# Patient Record
Sex: Female | Born: 1965
Health system: Southern US, Community
[De-identification: ages and names within clinical notes are randomized; demographics above are authoritative.]

## PROBLEM LIST (undated history)

## (undated) DIAGNOSIS — F419 Anxiety disorder, unspecified: Secondary | ICD-10-CM

## (undated) DIAGNOSIS — K219 Gastro-esophageal reflux disease without esophagitis: Secondary | ICD-10-CM

## (undated) DIAGNOSIS — M545 Low back pain, unspecified: Secondary | ICD-10-CM

## (undated) DIAGNOSIS — Z8041 Family history of malignant neoplasm of ovary: Secondary | ICD-10-CM

## (undated) DIAGNOSIS — G2581 Restless legs syndrome: Secondary | ICD-10-CM

## (undated) DIAGNOSIS — T7840XA Allergy, unspecified, initial encounter: Secondary | ICD-10-CM

## (undated) DIAGNOSIS — M1990 Unspecified osteoarthritis, unspecified site: Secondary | ICD-10-CM

## (undated) DIAGNOSIS — M5431 Sciatica, right side: Secondary | ICD-10-CM

## (undated) DIAGNOSIS — G43909 Migraine, unspecified, not intractable, without status migrainosus: Secondary | ICD-10-CM

## (undated) DIAGNOSIS — R42 Dizziness and giddiness: Secondary | ICD-10-CM

## (undated) DIAGNOSIS — G4700 Insomnia, unspecified: Secondary | ICD-10-CM

## (undated) DIAGNOSIS — N951 Menopausal and female climacteric states: Secondary | ICD-10-CM

## (undated) DIAGNOSIS — Z87442 Personal history of urinary calculi: Secondary | ICD-10-CM

## (undated) DIAGNOSIS — N390 Urinary tract infection, site not specified: Secondary | ICD-10-CM

## (undated) DIAGNOSIS — F32A Depression, unspecified: Secondary | ICD-10-CM

## (undated) DIAGNOSIS — F329 Major depressive disorder, single episode, unspecified: Secondary | ICD-10-CM

## (undated) DIAGNOSIS — E559 Vitamin D deficiency, unspecified: Secondary | ICD-10-CM

## (undated) HISTORY — DX: Low back pain, unspecified: M54.50

## (undated) HISTORY — PX: ABDOMINAL HYSTERECTOMY: SHX81

## (undated) HISTORY — DX: Unspecified osteoarthritis, unspecified site: M19.90

## (undated) HISTORY — DX: Migraine, unspecified, not intractable, without status migrainosus: G43.909

## (undated) HISTORY — PX: DILATION AND CURETTAGE OF UTERUS: SHX78

## (undated) HISTORY — DX: Low back pain: M54.5

## (undated) HISTORY — DX: Restless legs syndrome: G25.81

## (undated) HISTORY — PX: TONSILLECTOMY AND ADENOIDECTOMY: SUR1326

## (undated) HISTORY — DX: Insomnia, unspecified: G47.00

## (undated) HISTORY — DX: Family history of malignant neoplasm of ovary: Z80.41

## (undated) HISTORY — PX: WISDOM TOOTH EXTRACTION: SHX21

## (undated) HISTORY — DX: Allergy, unspecified, initial encounter: T78.40XA

## (undated) HISTORY — PX: BACK SURGERY: SHX140

## (undated) HISTORY — DX: Sciatica, right side: M54.31

## (undated) HISTORY — DX: Gastro-esophageal reflux disease without esophagitis: K21.9

## (undated) HISTORY — DX: Vitamin D deficiency, unspecified: E55.9

## (undated) HISTORY — DX: Depression, unspecified: F32.A

## (undated) HISTORY — DX: Menopausal and female climacteric states: N95.1

## (undated) HISTORY — DX: Urinary tract infection, site not specified: N39.0

## (undated) HISTORY — PX: DIAGNOSTIC LAPAROSCOPY: SUR761

## (undated) HISTORY — DX: Major depressive disorder, single episode, unspecified: F32.9

## (undated) HISTORY — PX: URETHRAL STRICTURE DILATATION: SHX477

## (undated) HISTORY — DX: Anxiety disorder, unspecified: F41.9

---

## 2007-11-23 ENCOUNTER — Ambulatory Visit: Payer: Self-pay | Admitting: Family Medicine

## 2008-06-15 ENCOUNTER — Ambulatory Visit: Payer: Self-pay | Admitting: Family Medicine

## 2008-07-16 ENCOUNTER — Ambulatory Visit: Payer: Self-pay | Admitting: Family Medicine

## 2008-11-26 ENCOUNTER — Ambulatory Visit: Payer: Self-pay | Admitting: Family Medicine

## 2009-11-27 ENCOUNTER — Ambulatory Visit: Payer: Self-pay | Admitting: Family Medicine

## 2011-04-07 ENCOUNTER — Ambulatory Visit: Payer: Self-pay | Admitting: Physician Assistant

## 2011-04-21 ENCOUNTER — Ambulatory Visit: Payer: Self-pay | Admitting: Orthopedic Surgery

## 2011-05-07 HISTORY — PX: KNEE SURGERY: SHX244

## 2013-07-14 ENCOUNTER — Ambulatory Visit: Payer: Self-pay | Admitting: Family Medicine

## 2013-07-14 LAB — LIPID PANEL
CHOLESTEROL: 145 mg/dL (ref 0–200)
HDL: 48 mg/dL (ref 35–70)
LDL CALC: 78 mg/dL
Triglycerides: 95 mg/dL (ref 40–160)

## 2013-07-14 LAB — HM MAMMOGRAPHY: HM Mammogram: NORMAL

## 2013-07-27 LAB — HM PAP SMEAR: HM Pap smear: NORMAL

## 2013-07-27 LAB — HEMOGLOBIN A1C: Hgb A1c MFr Bld: 5.2 % (ref 4.0–6.0)

## 2014-12-27 ENCOUNTER — Encounter: Payer: Self-pay | Admitting: Family Medicine

## 2014-12-27 DIAGNOSIS — E8881 Metabolic syndrome: Secondary | ICD-10-CM | POA: Insufficient documentation

## 2014-12-27 DIAGNOSIS — G2581 Restless legs syndrome: Secondary | ICD-10-CM | POA: Insufficient documentation

## 2014-12-27 DIAGNOSIS — G4700 Insomnia, unspecified: Secondary | ICD-10-CM | POA: Insufficient documentation

## 2014-12-27 DIAGNOSIS — M5126 Other intervertebral disc displacement, lumbar region: Secondary | ICD-10-CM | POA: Insufficient documentation

## 2014-12-27 DIAGNOSIS — E669 Obesity, unspecified: Secondary | ICD-10-CM | POA: Insufficient documentation

## 2014-12-27 DIAGNOSIS — M5136 Other intervertebral disc degeneration, lumbar region: Secondary | ICD-10-CM | POA: Insufficient documentation

## 2014-12-27 DIAGNOSIS — K219 Gastro-esophageal reflux disease without esophagitis: Secondary | ICD-10-CM | POA: Insufficient documentation

## 2014-12-27 DIAGNOSIS — J302 Other seasonal allergic rhinitis: Secondary | ICD-10-CM | POA: Insufficient documentation

## 2014-12-27 DIAGNOSIS — M1990 Unspecified osteoarthritis, unspecified site: Secondary | ICD-10-CM | POA: Insufficient documentation

## 2014-12-27 DIAGNOSIS — G43009 Migraine without aura, not intractable, without status migrainosus: Secondary | ICD-10-CM | POA: Insufficient documentation

## 2014-12-27 DIAGNOSIS — F331 Major depressive disorder, recurrent, moderate: Secondary | ICD-10-CM | POA: Insufficient documentation

## 2014-12-28 ENCOUNTER — Encounter: Payer: Self-pay | Admitting: Family Medicine

## 2014-12-28 ENCOUNTER — Encounter (INDEPENDENT_AMBULATORY_CARE_PROVIDER_SITE_OTHER): Payer: Self-pay

## 2014-12-28 ENCOUNTER — Ambulatory Visit (INDEPENDENT_AMBULATORY_CARE_PROVIDER_SITE_OTHER): Payer: 59 | Admitting: Family Medicine

## 2014-12-28 VITALS — BP 114/62 | HR 88 | Temp 98.3°F | Resp 16 | Ht 63.0 in | Wt 212.3 lb

## 2014-12-28 DIAGNOSIS — M5417 Radiculopathy, lumbosacral region: Secondary | ICD-10-CM | POA: Diagnosis not present

## 2014-12-28 DIAGNOSIS — G43009 Migraine without aura, not intractable, without status migrainosus: Secondary | ICD-10-CM

## 2014-12-28 DIAGNOSIS — G4700 Insomnia, unspecified: Secondary | ICD-10-CM | POA: Diagnosis not present

## 2014-12-28 DIAGNOSIS — J302 Other seasonal allergic rhinitis: Secondary | ICD-10-CM

## 2014-12-28 DIAGNOSIS — K219 Gastro-esophageal reflux disease without esophagitis: Secondary | ICD-10-CM

## 2014-12-28 DIAGNOSIS — M5416 Radiculopathy, lumbar region: Secondary | ICD-10-CM | POA: Insufficient documentation

## 2014-12-28 DIAGNOSIS — G2581 Restless legs syndrome: Secondary | ICD-10-CM

## 2014-12-28 MED ORDER — CYCLOBENZAPRINE HCL 10 MG PO TABS: 10.0000 mg | ORAL_TABLET | Freq: Three times a day (TID) | ORAL | Status: DC | PRN

## 2014-12-28 MED ORDER — PRAMIPEXOLE DIHYDROCHLORIDE 1.5 MG PO TABS: 1.5000 mg | ORAL_TABLET | Freq: Every day | ORAL | Status: DC

## 2014-12-28 MED ORDER — TRAMADOL HCL 50 MG PO TABS: 50.0000 mg | ORAL_TABLET | Freq: Two times a day (BID) | ORAL | Status: DC | PRN

## 2014-12-28 MED ORDER — LORATADINE 10 MG PO TABS: 10.0000 mg | ORAL_TABLET | Freq: Two times a day (BID) | ORAL | Status: DC

## 2014-12-28 MED ORDER — PREDNISONE 10 MG (48) PO TBPK: ORAL_TABLET | Freq: Every day | ORAL | Status: DC

## 2014-12-28 NOTE — Patient Instructions (Signed)
Back Pain, Adult Low back pain is very common. About 1 in 5 people have back pain.The cause of low back pain is rarely dangerous. The pain often gets better over time.About half of people with a sudden onset of back pain feel better in just 2 weeks. About 8 in 10 people feel better by 6 weeks.  CAUSES Some common causes of back pain include:  Strain of the muscles or ligaments supporting the spine.  Wear and tear (degeneration) of the spinal discs.  Arthritis.  Direct injury to the back. DIAGNOSIS Most of the time, the direct cause of low back pain is not known.However, back pain can be treated effectively even when the exact cause of the pain is unknown.Answering your caregiver's questions about your overall health and symptoms is one of the most accurate ways to make sure the cause of your pain is not dangerous. If your caregiver needs more information, he or she may order lab work or imaging tests (X-rays or MRIs).However, even if imaging tests show changes in your back, this usually does not require surgery. HOME CARE INSTRUCTIONS For many people, back pain returns.Since low back pain is rarely dangerous, it is often a condition that people can learn to manageon their own.   Remain active. It is stressful on the back to sit or stand in one place. Do not sit, drive, or stand in one place for more than 30 minutes at a time. Take short walks on level surfaces as soon as pain allows.Try to increase the length of time you walk each day.  Do not stay in bed.Resting more than 1 or 2 days can delay your recovery.  Do not avoid exercise or work.Your body is made to move.It is not dangerous to be active, even though your back may hurt.Your back will likely heal faster if you return to being active before your pain is gone.  Pay attention to your body when you bend and lift. Many people have less discomfortwhen lifting if they bend their knees, keep the load close to their bodies,and  avoid twisting. Often, the most comfortable positions are those that put less stress on your recovering back.  Find a comfortable position to sleep. Use a firm mattress and lie on your side with your knees slightly bent. If you lie on your back, put a pillow under your knees.  Only take over-the-counter or prescription medicines as directed by your caregiver. Over-the-counter medicines to reduce pain and inflammation are often the most helpful.Your caregiver may prescribe muscle relaxant drugs.These medicines help dull your pain so you can more quickly return to your normal activities and healthy exercise.  Put ice on the injured area.  Put ice in a plastic bag.  Place a towel between your skin and the bag.  Leave the ice on for 15-20 minutes, 03-04 times a day for the first 2 to 3 days. After that, ice and heat may be alternated to reduce pain and spasms.  Ask your caregiver about trying back exercises and gentle massage. This may be of some benefit.  Avoid feeling anxious or stressed.Stress increases muscle tension and can worsen back pain.It is important to recognize when you are anxious or stressed and learn ways to manage it.Exercise is a great option. SEEK MEDICAL CARE IF:  You have pain that is not relieved with rest or medicine.  You have pain that does not improve in 1 week.  You have new symptoms.  You are generally not feeling well. SEEK   IMMEDIATE MEDICAL CARE IF:   You have pain that radiates from your back into your legs.  You develop new bowel or bladder control problems.  You have unusual weakness or numbness in your arms or legs.  You develop nausea or vomiting.  You develop abdominal pain.  You feel faint. Document Released: 06/22/2005 Document Revised: 12/22/2011 Document Reviewed: 10/24/2013 Exit[employer] Patient Information 2015 [employer]. This information is not intended to replace advice given to you by your health care provider. Make sure you  discuss any questions you have with your health care provider.  

## 2014-12-28 NOTE — Progress Notes (Signed)
Name: Kelly [name]   MRN: [id number]    DOB: Nov 10, 1965   Date:12/28/2014       Progress Note  Subjective  Chief Complaint  Chief Complaint  Patient presents with  . Headache    one currently  . Insomnia    pt states back pain keeps her awake  . Allergic Rhinitis     worsening itchy watery eyes, runny nose  . Back Pain    worsening since sunday low back pain radiating down right leg and weakness  . Gastrophageal Reflux    patient seen commercials about symptoms of UTI's and kidney stones from drugs    HPI  Acute low back pain: she has a history of herniated disc disease, and recurrently radiculitis down right leg.  However last flare was one year ago. She states pain at this time is very high. She has been out of Tramadol and Flexeril. She denies bowel or bladder incontinence, no saddle anesthesia. Pain is described as a dull ache on her back, but burning/sharp down her right leg. Pain is constant with periods of exacerbation.   GERD: controlled with Omeprazole, denies side effects  AR: worse because spending more time outdoors, rhinorrhea, itchy eyes and ears and post-nasal drainage.  Insomnia: doing better, but still getting up at 4am. Sleeping 6 hours per night, trying to have better sleep hygiene  Migraine headaches: intermittently, worse around her cycles and barometry pressure changes, allergies seems to flare it also. Takes Imitrex prn and responds to medication Patient Active Problem List   Diagnosis Date Noted  . Right lumbar radiculitis 12/28/2014  . Anxiety and depression 12/27/2014  . Insomnia, persistent 12/27/2014  . Gastro-esophageal reflux disease without esophagitis 12/27/2014  . Bulge of lumbar disc without myelopathy 12/27/2014  . Dysmetabolic syndrome 57/89/7847  . Migraine without aura and responsive to treatment 12/27/2014  . Osteoarthrosis 12/27/2014  . Obesity (BMI 30-39.9) 12/27/2014  . Restless leg 12/27/2014  . Allergic rhinitis, seasonal  12/27/2014    Past Surgical History  Procedure Laterality Date  . Tonsillectomy and adenoidectomy    . Urethral stricture dilatation      History reviewed. No pertinent family history.  History   Social History  . Marital Status: Married    Spouse Name: N/A  . Number of Children: N/A  . Years of Education: N/A   Occupational History  . Not on file.   Social History Main Topics  . Smoking status: Former Smoker -- 6.00 packs/day for 2 years    Types: Cigarettes    Quit date: 07/07/1983  . Smokeless tobacco: Not on file  . Alcohol Use: No  . Drug Use: No  . Sexual Activity: Yes   Other Topics Concern  . Not on file   Social History Narrative  . No narrative on file     Current outpatient prescriptions:  .  ALPRAZolam (XANAX) 0.5 MG tablet, Take 1 tablet by mouth 2 (two) times daily as needed., Disp: , Rfl:  .  cyclobenzaprine (FLEXERIL) 10 MG tablet, Take 1 tablet (10 mg total) by mouth 3 (three) times daily as needed for muscle spasms., Disp: 30 tablet, Rfl: 0 .  gabapentin (NEURONTIN) 300 MG capsule, Take 1 capsule by mouth 3 (three) times daily., Disp: , Rfl:  .  loratadine (CLARITIN) 10 MG tablet, Take 1 tablet (10 mg total) by mouth 2 (two) times daily., Disp: 60 tablet, Rfl: 5 .  omeprazole (PRILOSEC) 40 MG capsule, Take 1 capsule by mouth daily., Disp: ,  Rfl:  .  pramipexole (MIRAPEX) 1.5 MG tablet, Take 1 tablet (1.5 mg total) by mouth daily., Disp: 30 tablet, Rfl: 5 .  SUMAtriptan (IMITREX) 50 MG tablet, Take 1 tablet by mouth as needed., Disp: , Rfl:  .  traMADol (ULTRAM) 50 MG tablet, Take 1 tablet (50 mg total) by mouth 2 (two) times daily as needed., Disp: 90 tablet, Rfl: 0 .  traZODone (DESYREL) 50 MG tablet, Take 1 tablet by mouth as needed., Disp: , Rfl:  .  predniSONE (STERAPRED UNI-PAK 48 TAB) 10 MG (48) TBPK tablet, Take by mouth daily., Disp: 48 tablet, Rfl: 0  Allergies  Allergen Reactions  . Cephalexin   . Ciprofloxacin Hives  . Nitrofurantoin  Monohyd Macro   . Penicillins   . Sulfa Antibiotics      ROS  Constitutional: Negative for fever or weight change.  Respiratory: Negative for cough and shortness of breath.   Cardiovascular: Negative for chest pain or palpitations.  Gastrointestinal: Negative for abdominal pain, no bowel changes.  Musculoskeletal: Negative for gait problem or joint swelling.  Skin: Negative for rash.  Neurological: Negative for dizziness + for  headache.  No other specific complaints in a complete review of systems (except as listed in HPI above).   Objective  Filed Vitals:   12/28/14 1010  BP: 114/62  Pulse: 88  Temp: 98.3 F (36.8 C)  TempSrc: Oral  Resp: 16  Height: 5\' 3"  (1.6 m)  Weight: 212 lb 4.8 oz (96.299 kg)  SpO2: 97%    Body mass index is 37.62 kg/(m^2).  Physical Exam  Constitutional: Patient appears well-developed and well-nourished. No distress.  Eyes:  No scleral icterus. PERL Neck: Normal range of motion. Neck supple. Cardiovascular: Normal rate, regular rhythm and normal heart sounds.  No murmur heard. No BLE edema. Pulmonary/Chest: Effort normal and breath sounds normal. No respiratory distress. Abdominal: Soft.  There is no tenderness. Psychiatric: Patient has a normal mood and affect. behavior is normal. Judgment and thought content normal. Muscular skeletal exam: pain during palpation of lumbar spine, positive straight leg raise, normal gait, normal sensation to touch of both lower extremities    PHQ2/9: Depression screen [facility] 2/9 12/28/2014  Decreased Interest 0  Down, Depressed, Hopeless 0  PHQ - 2 Score 0     Fall Risk: Fall Risk  12/28/2014  Falls in the past year? Yes  Number falls in past yr: 1  Injury with Fall? Yes     Assessment & Plan  1. Right lumbar radiculitis Start medication, return if no resolution - predniSONE (STERAPRED UNI-PAK 48 TAB) 10 MG (48) TBPK tablet; Take by mouth daily.  Dispense: 48 tablet; Refill: 0 - cyclobenzaprine  (FLEXERIL) 10 MG tablet; Take 1 tablet (10 mg total) by mouth 3 (three) times daily as needed for muscle spasms.  Dispense: 30 tablet; Refill: 0 - traMADol (ULTRAM) 50 MG tablet; Take 1 tablet (50 mg total) by mouth 2 (two) times daily as needed.  Dispense: 90 tablet; Refill: 0  2. Restless leg  - pramipexole (MIRAPEX) 1.5 MG tablet; Take 1 tablet (1.5 mg total) by mouth daily.  Dispense: 30 tablet; Refill: 5  3. Migraine without aura and responsive to treatment Continue prn medication  4. Gastro-esophageal reflux disease without esophagitis stable  5. Insomnia, persistent Doing well at this time  6. Allergic rhinitis, seasonal  - loratadine (CLARITIN) 10 MG tablet; Take 1 tablet (10 mg total) by mouth 2 (two) times daily.  Dispense: 60 tablet; Refill: 5

## 2015-01-11 ENCOUNTER — Other Ambulatory Visit: Payer: Self-pay | Admitting: Family Medicine

## 2015-01-15 ENCOUNTER — Ambulatory Visit (INDEPENDENT_AMBULATORY_CARE_PROVIDER_SITE_OTHER): Payer: 59 | Admitting: Family Medicine

## 2015-01-15 ENCOUNTER — Encounter: Payer: Self-pay | Admitting: Family Medicine

## 2015-01-15 VITALS — BP 116/62 | HR 95 | Temp 97.9°F | Resp 20 | Ht 63.0 in | Wt 214.3 lb

## 2015-01-15 DIAGNOSIS — G43019 Migraine without aura, intractable, without status migrainosus: Secondary | ICD-10-CM | POA: Diagnosis not present

## 2015-01-15 DIAGNOSIS — J0100 Acute maxillary sinusitis, unspecified: Secondary | ICD-10-CM

## 2015-01-15 DIAGNOSIS — J069 Acute upper respiratory infection, unspecified: Secondary | ICD-10-CM

## 2015-01-15 MED ORDER — PREDNISONE 20 MG PO TABS: 20.0000 mg | ORAL_TABLET | Freq: Every day | ORAL | Status: DC

## 2015-01-15 MED ORDER — PROMETHAZINE HCL 12.5 MG PO TABS: 12.5000 mg | ORAL_TABLET | Freq: Three times a day (TID) | ORAL | Status: DC | PRN

## 2015-01-15 MED ORDER — CEFUROXIME AXETIL 500 MG PO TABS: 500.0000 mg | ORAL_TABLET | Freq: Two times a day (BID) | ORAL | Status: DC

## 2015-01-15 NOTE — Progress Notes (Signed)
Name: Kelly [name]   MRN: [id number]    DOB: [birth year]   Date:01/15/2015       Progress Note  Subjective  Chief Complaint  Chief Complaint  Patient presents with  . URI    Onset- several days, follow up with Urgent Care and was given Z-pak which patient can't tolerate. Worsen-Headaches, Moist Cough-little mucus, sinus pain pressure,ear pressure and pain in bilateral ears, nausea, vomiting.  . Fever    100 F, chills and has improved with Tylenol    HPI  URI: she states she has been sick for one week, but getting progressively worse, went to Urgent Care yesterday, diagnosed with sinusitis and given Z-pack but she can't tolerate it, she would like to be switched to Ceftin - she is aware that she had a reaction to Keflex and PnC, but she states not allergic to Ceftin ( it is the only antibiotic that I can take).  Facial pain is severe now, hoarseness and a moist cough.   Migraine: she describes the headache as throbbing, associated with photophobia, nausea, initially had vomiting, but that has resolved.   Patient Active Problem List   Diagnosis Date Noted  . Right lumbar radiculitis 12/28/2014  . Anxiety and depression 12/27/2014  . Insomnia, persistent 12/27/2014  . Gastro-esophageal reflux disease without esophagitis 12/27/2014  . Bulge of lumbar disc without myelopathy 12/27/2014  . Dysmetabolic syndrome 40/98/1191  . Migraine without aura and responsive to treatment 12/27/2014  . Osteoarthrosis 12/27/2014  . Obesity (BMI 30-39.9) 12/27/2014  . Restless leg 12/27/2014  . Allergic rhinitis, seasonal 12/27/2014    Past Surgical History  Procedure Laterality Date  . Tonsillectomy and adenoidectomy    . Urethral stricture dilatation    . Knee surgery Left 05/2011    Dr. Rudene Christians- Arthroscopic    History reviewed. No pertinent family history.  History   Social History  . Marital Status: Married    Spouse Name: N/A  . Number of Children: N/A  . Years of Education: N/A    Occupational History  . Not on file.   Social History Main Topics  . Smoking status: Former Smoker -- 0.75 packs/day for 2 years    Types: Cigarettes    Start date: 07/06/1981    Quit date: 07/07/1983  . Smokeless tobacco: Never Used  . Alcohol Use: No  . Drug Use: No  . Sexual Activity:    Partners: Male   Other Topics Concern  . Not on file   Social History Narrative     Current outpatient prescriptions:  .  ALPRAZolam (XANAX) 0.5 MG tablet, Take 1 tablet by mouth 2 (two) times daily as needed., Disp: , Rfl:  .  Butalbital-APAP-Caffeine 50-325-40 MG per capsule, Take 1 capsule by mouth every 4 (four) hours as needed., Disp: , Rfl:  .  cyclobenzaprine (FLEXERIL) 10 MG tablet, Take 1 tablet (10 mg total) by mouth 3 (three) times daily as needed for muscle spasms., Disp: 30 tablet, Rfl: 0 .  gabapentin (NEURONTIN) 300 MG capsule, Take 1 capsule by mouth 3 (three) times daily., Disp: , Rfl:  .  loratadine (CLARITIN) 10 MG tablet, TAKE ONE TABLET BY MOUTH ONE TIME DAILY, Disp: 30 tablet, Rfl: 0 .  omeprazole (PRILOSEC) 40 MG capsule, Take 1 capsule by mouth daily., Disp: , Rfl:  .  pramipexole (MIRAPEX) 1.5 MG tablet, TAKE ONE TABLET BY MOUTH DAILY IN THE EVENING FOR RLS, Disp: 30 tablet, Rfl: 2 .  SUMAtriptan (IMITREX) 50 MG tablet, Take  1 tablet by mouth as needed., Disp: , Rfl:  .  traMADol (ULTRAM) 50 MG tablet, Take 1 tablet (50 mg total) by mouth 2 (two) times daily as needed., Disp: 90 tablet, Rfl: 0 .  traZODone (DESYREL) 50 MG tablet, Take 1 tablet by mouth as needed., Disp: , Rfl:  .  triamcinolone (NASACORT) 55 MCG/ACT AERO nasal inhaler, Place 2 sprays into the nose daily., Disp: , Rfl:  .  cefUROXime (CEFTIN) 500 MG tablet, Take 1 tablet (500 mg total) by mouth 2 (two) times daily with a meal., Disp: 20 tablet, Rfl: 0 .  predniSONE (DELTASONE) 20 MG tablet, Take 1 tablet (20 mg total) by mouth daily with breakfast., Disp: 4 tablet, Rfl: 0 .  promethazine (PHENERGAN)  12.5 MG tablet, Take 1 tablet (12.5 mg total) by mouth every 8 (eight) hours as needed for nausea or vomiting., Disp: 12 tablet, Rfl: 0  Allergies  Allergen Reactions  . Cephalexin   . Ciprofloxacin Hives  . Nitrofurantoin Monohyd Macro   . Penicillins   . Sulfa Antibiotics      ROS  Ten systems reviewed and is negative except as mentioned in HPI   Objective  Filed Vitals:   01/15/15 0941  BP: 116/62  Pulse: 95  Temp: 97.9 F (36.6 C)  TempSrc: Oral  Resp: 20  Height: 5\' 3"  (1.6 m)  Weight: 214 lb 4.8 oz (97.206 kg)  SpO2: 94%    Body mass index is 37.97 kg/(m^2).  Physical Exam   Constitutional: Patient appears well-developed and obese. Mild distress, wearing her shades and crying Eyes:  No scleral icterus. PERL Sinus tender to percussion  Neck: Normal range of motion. Neck supple. Ears: within normal limits Nares: mild bulging of turbinates Cardiovascular: Normal rate, regular rhythm and normal heart sounds.  No murmur heard. No BLE edema. Pulmonary/Chest: Effort normal and breath sounds normal. No respiratory distress. Abdominal: Soft.  There is no tenderness. Psychiatric: Patient has a normal mood and affect. behavior is normal. Judgment and thought content normal. Neuro: awake, alert, oriented, normal cranial nerves, normal reflexes, normal sensation  PHQ2/9: Depression screen [facility] 2/9 12/28/2014  Decreased Interest 0  Down, Depressed, Hopeless 0  PHQ - 2 Score 0     Fall Risk: Fall Risk  12/28/2014  Falls in the past year? Yes  Number falls in past yr: 1  Injury with Fall? Yes  Follow up Falls evaluation completed      Assessment & Plan  1. Upper respiratory infection Advised otc medication, nasal saline, hold off on antibiotics, try prednisone first  2. Intractable migraine without aura and without status migrainosus Taking some Butalbital otc, also failed Imitrex - predniSONE (DELTASONE) 20 MG tablet; Take 1 tablet (20 mg total) by mouth  daily with breakfast.  Dispense: 4 tablet; Refill: 0 - promethazine (PHENERGAN) 12.5 MG tablet; Take 1 tablet (12.5 mg total) by mouth every 8 (eight) hours as needed for nausea or vomiting.  Dispense: 12 tablet; Refill: 0  3. Acute maxillary sinusitis, recurrence not specified Likely viral, told to try prednisone and if no improvement get prescription of antibiotic filled - cefUROXime (CEFTIN) 500 MG tablet; Take 1 tablet (500 mg total) by mouth 2 (two) times daily with a meal.  Dispense: 20 tablet; Refill: 0

## 2015-02-03 ENCOUNTER — Other Ambulatory Visit: Payer: Self-pay | Admitting: Family Medicine

## 2015-02-10 ENCOUNTER — Other Ambulatory Visit: Payer: Self-pay | Admitting: Family Medicine

## 2015-04-05 ENCOUNTER — Encounter: Payer: Self-pay | Admitting: Family Medicine

## 2015-04-05 ENCOUNTER — Ambulatory Visit (INDEPENDENT_AMBULATORY_CARE_PROVIDER_SITE_OTHER): Payer: 59 | Admitting: Family Medicine

## 2015-04-05 ENCOUNTER — Other Ambulatory Visit: Payer: Self-pay | Admitting: Family Medicine

## 2015-04-05 VITALS — BP 118/72 | HR 82 | Temp 97.8°F | Resp 16 | Ht 63.0 in | Wt 217.0 lb

## 2015-04-05 DIAGNOSIS — R319 Hematuria, unspecified: Secondary | ICD-10-CM | POA: Diagnosis not present

## 2015-04-05 DIAGNOSIS — M5416 Radiculopathy, lumbar region: Secondary | ICD-10-CM

## 2015-04-05 DIAGNOSIS — E669 Obesity, unspecified: Secondary | ICD-10-CM | POA: Diagnosis not present

## 2015-04-05 DIAGNOSIS — R202 Paresthesia of skin: Secondary | ICD-10-CM

## 2015-04-05 DIAGNOSIS — E8881 Metabolic syndrome: Secondary | ICD-10-CM

## 2015-04-05 DIAGNOSIS — Z23 Encounter for immunization: Secondary | ICD-10-CM

## 2015-04-05 DIAGNOSIS — R600 Localized edema: Secondary | ICD-10-CM | POA: Diagnosis not present

## 2015-04-05 DIAGNOSIS — M5417 Radiculopathy, lumbosacral region: Secondary | ICD-10-CM | POA: Diagnosis not present

## 2015-04-05 LAB — POCT URINALYSIS DIPSTICK
Bilirubin, UA: NEGATIVE
GLUCOSE UA: NEGATIVE
Ketones, UA: NEGATIVE
NITRITE UA: NEGATIVE
PROTEIN UA: NEGATIVE
Spec Grav, UA: 1.015
UROBILINOGEN UA: 0.2
pH, UA: 5.5

## 2015-04-05 MED ORDER — MEDICAL COMPRESSION STOCKINGS MISC: 2.0000 [IU] | Freq: Every day | Status: DC

## 2015-04-05 MED ORDER — MELOXICAM 15 MG PO TABS: 15.0000 mg | ORAL_TABLET | Freq: Every day | ORAL | Status: DC

## 2015-04-05 NOTE — Progress Notes (Signed)
Name: Kelly [name]   MRN: [id number]    DOB: 10-Nov-1965   Date:04/05/2015       Progress Note  Subjective  Chief Complaint  Chief Complaint  Patient presents with  . Edema    bilateral feet, has been working and standing on them all day  . Sciatica    right side low back radiating down leg with numbness    HPI  Bilateral Low Extremity edema: she started working at [employer] and has noticed significant edema on lower extremities. She denies orthopnea or decrease in exercise tolerance. No chest pain. Normal urine output.   Sciatica: doing better now, finished prednisone, still on gabapentin.  Paresthesia hands: usually on right hand from 5th to index finger, numbness goes up to lateral elbow. No neck symptoms. No weakness, she has been crocheting more than usual, and uses her hands more at work   Obesity: she has bene eating at work, drinking more orange juice, gaining weight. She had decreased salt intake and is avoiding sodas. Her mother is obese, status post bariatric surgery  Metabolic Syndrome: still eats high carbohydrate diet  Patient Active Problem List   Diagnosis Date Noted  . Right lumbar radiculitis 12/28/2014  . Anxiety and depression 12/27/2014  . Insomnia, persistent 12/27/2014  . Gastro-esophageal reflux disease without esophagitis 12/27/2014  . Bulge of lumbar disc without myelopathy 12/27/2014  . Dysmetabolic syndrome 74/25/9563  . Migraine without aura and responsive to treatment 12/27/2014  . Osteoarthrosis 12/27/2014  . Obesity (BMI 30-39.9) 12/27/2014  . Restless leg 12/27/2014  . Allergic rhinitis, seasonal 12/27/2014    Past Surgical History  Procedure Laterality Date  . Tonsillectomy and adenoidectomy    . Urethral stricture dilatation    . Knee surgery Left 05/2011    Dr. Rudene Christians- Arthroscopic    History reviewed. No pertinent family history.  Social History   Social History  . Marital Status: Married    Spouse  Name: N/A  . Number of Children: N/A  . Years of Education: N/A   Occupational History  . Not on file.   Social History Main Topics  . Smoking status: Former Smoker -- 0.75 packs/day for 2 years    Types: Cigarettes    Start date: 07/06/1981    Quit date: 07/07/1983  . Smokeless tobacco: Never Used  . Alcohol Use: No  . Drug Use: No  . Sexual Activity:    Partners: Male   Other Topics Concern  . Not on file   Social History Narrative     Current outpatient prescriptions:  .  ALPRAZolam (XANAX) 0.5 MG tablet, Take 1 tablet by mouth 2 (two) times daily as needed., Disp: , Rfl:  .  cyclobenzaprine (FLEXERIL) 10 MG tablet, Take 1 tablet (10 mg total) by mouth 3 (three) times daily as needed for muscle spasms., Disp: 30 tablet, Rfl: 0 .  Elastic Bandages & Supports (MEDICAL COMPRESSION STOCKINGS) MISC, 2 Units by Does not apply route daily., Disp: 1 each, Rfl: 2 .  gabapentin (NEURONTIN) 300 MG capsule, TAKE ONE CAPSULE BY MOUTH THREE TIMES DAILY, Disp: 90 capsule, Rfl: 1 .  loratadine (CLARITIN) 10 MG tablet, TAKE ONE TABLET BY MOUTH ONE TIME DAILY, Disp: 30 tablet, Rfl: 0 .  omeprazole (PRILOSEC) 40 MG capsule, TAKE ONE CAPSULE BY MOUTH EVERY MORNING, Disp: 30 capsule, Rfl: 5 .  pramipexole (MIRAPEX) 1.5 MG tablet, TAKE ONE TABLET BY MOUTH DAILY IN THE EVENING FOR RLS, Disp: 30 tablet, Rfl: 2 .  promethazine (PHENERGAN) 12.5 MG tablet, Take 1 tablet (12.5 mg total) by mouth every 8 (eight) hours as needed for nausea or vomiting., Disp: 12 tablet, Rfl: 0 .  SUMAtriptan (IMITREX) 50 MG tablet, Take 1 tablet by mouth as needed., Disp: , Rfl:  .  traMADol (ULTRAM) 50 MG tablet, Take 1 tablet (50 mg total) by mouth 2 (two) times daily as needed., Disp: 90 tablet, Rfl: 0 .  traZODone (DESYREL) 50 MG tablet, Take 1 tablet by mouth as needed., Disp: , Rfl:   Allergies  Allergen Reactions  . Cephalexin   . Ciprofloxacin Hives  . Nitrofurantoin Monohyd Macro   . Penicillins   . Sulfa  Antibiotics      ROS  Constitutional: Negative for fever , positive for  weight change.  Respiratory: Negative for cough and shortness of breath.   Cardiovascular: Negative for chest pain or palpitations.  Gastrointestinal: Negative for abdominal pain, no bowel changes.  Musculoskeletal: Negative for gait problem or joint swelling.  Skin: Negative for rash.  Neurological: Negative for dizziness or headache.  No other specific complaints in a complete review of systems (except as listed in HPI above).  Objective  Filed Vitals:   04/05/15 1122  BP: 118/72  Pulse: 82  Temp: 97.8 F (36.6 C)  TempSrc: Oral  Resp: 16  Height: 5\' 3"  (1.6 m)  Weight: 217 lb (98.431 kg)  SpO2: 97%    Body mass index is 38.45 kg/(m^2).  Physical Exam  Constitutional: Patient appears well-developed and well-nourished. Obese No distress.  HEENT: head atraumatic, normocephalic, pupils equal and reactive to light, neck supple, throat within normal limits Cardiovascular: Normal rate, regular rhythm and normal heart sounds.  No murmur heard.  BLE edema. Pitting on ankles, non pitting on lower leg Pulmonary/Chest: Effort normal and breath sounds normal. No respiratory distress. Abdominal: Soft.  There is no tenderness. Negative CVA tenderness Psychiatric: Patient has a normal mood and affect. behavior is normal. Judgment and thought content normal. Neurological Exam: negative tinnels' and phalen's, normal sensation both arms, pain during palpation of medial and lateral epicondyle, negative straight leg raise, mild back pain during palpation of lumbar spine   PHQ2/9: Depression screen [facility] 2/9 04/05/2015 12/28/2014  Decreased Interest 0 0  Down, Depressed, Hopeless 1 0  PHQ - 2 Score 1 0     Fall Risk: Fall Risk  04/05/2015 12/28/2014  Falls in the past year? Yes Yes  Number falls in past yr: 1 1  Injury with Fall? No Yes  Follow up - Falls evaluation completed      Functional Status  Survey: Is the patient deaf or have difficulty hearing?: No Does the patient have difficulty seeing, even when wearing glasses/contacts?: Yes (contacts) Does the patient have difficulty concentrating, remembering, or making decisions?: No Does the patient have difficulty walking or climbing stairs?: No Does the patient have difficulty dressing or bathing?: No Does the patient have difficulty doing errands alone such as visiting a doctor's office or shopping?: No   Assessment & Plan  1. Paresthesia of both hands  Possible ulnar entrapment, tendinitis, she does not want NCS at this time, no neck symptoms.  - Vitamin B12  2. Bilateral lower extremity edema  - Comprehensive metabolic panel - CBC with Differential/Platelet - TSH - Elastic Bandages & Supports (MEDICAL COMPRESSION STOCKINGS) MISC; 2 Units by Does not apply route daily.  Dispense: 1 each; Refill: 2 - POCT Urinalysis Dipstick  3. Needs flu shot  - Flu Vaccine QUAD  36+ mos PF IM (Fluarix & Fluzone Quad PF)  4. Dysmetabolic syndrome  - POCT glycosylated hemoglobin (Hb A1C)  5. Right lumbar radiculitis  - meloxicam (MOBIC) 15 MG tablet; Take 1 tablet (15 mg total) by mouth daily.  Dispense: 30 tablet; Refill: 0  6. Obesity (BMI 30-39.9)  7. Hematuria  - Urine Culture,  but if urine culture negative refer to Urologist

## 2015-04-05 NOTE — Patient Instructions (Signed)
My Fitness Pal    Phentermine; Topiramate extended-release capsules  What is this medicine? Qsymia Phentermine; topiramate (FEN ter meen; toe PYRE a mate) is a combination of two medicines used with a reduced calorie diet and exercise to help you lose weight. This medicine is only available through certified pharmacies enrolled in a special program. Your healthcare professional will tell you where you can get your medicine. If you have additional questions, you can visit the manufacture's website at [email or web address] or contact them by phone at [phone]. This medicine may be used for other purposes; ask your health care provider or pharmacist if you have questions. COMMON BRAND NAME(S): Qsymia What should I tell my health care provider before I take this medicine? They need to know if you have any of these conditions: -agitation -diarrhea -depression or other mental illness -diabetes -glaucoma -heart disease -high or low blood pressure -history of anorexia or other eating disorder -history of substance abuse -kidney stones or kidney disease -liver disease -lung disease like asthma, obstructive pulmonary disease, emphysema -metabolic acidosis -on a ketogenic diet -scheduled for surgery or a procedure -suicidal thoughts, plans, or attempt; a previous suicide attempt by you or a family member -taken an MAOI like Carbex, Eldepryl, Marplan, Nardil, or Parnate in last 14 days -thyroid disease -an unusual or allergic reaction to phentermine, topiramate, other medicines, foods, dyes, or preservatives -pregnant or trying to get pregnant -breast-feeding How should I use this medicine? Take this medicine by mouth with a glass of water. Follow the directions on the prescription label. Do not crush or chew. This medicine is usually taken with or without food once per day in the morning. Avoid taking this medicine in the evening. It may interfere with sleep. Take your doses at regular  intervals. Do not take your medicine more often than directed. A special MedGuide will be given to you by the pharmacist with each prescription and refill. Be sure to read this information carefully each time. Talk to your pediatrician regarding the use of this medicine in children. Special care may be needed. Overdosage: If you think you've taken too much of this medicine contact a poison control center or emergency room at once. Overdosage: If you think you have taken too much of this medicine contact a poison control center or emergency room at once. NOTE: This medicine is only for you. Do not share this medicine with others. What if I miss a dose? If you miss a dose, take it as soon as you can. If it is almost time for your next dose, take only that dose. Do not take double or extra doses. What may interact with this medicine? Do not take this medicine with any of the following medications: -MAOIs like Carbex, Eldepryl, Marplan, Nardil, and ParnateThis medicine may also interact with the following medications: -acetazolamide -amitriptyline -antihistamines for allergy, cough and cold -atropine -birth control pills -carbamazepine -certain medicines for bladder problems like oxybutynin, tolterodine -certain medicines for depression, anxiety, or psychotic disturbances -certain medicines for Parkinson's disease like benztropine, trihexyphenidyl -certain medicines for stomach problems like dicyclomine, hyoscyamine -certain medicines for travel sickness like scopolamine -dichlorphenamide -digoxin -diltiazem -diuretics -hydrochlorothiazide -ipratropium -lithium -medicines for diabetes -medicines for pain, sleep, or muscle relaxation -methazolamide -phenytoin -pioglitazone -stimulant medicines for attention disorders, weight loss, or to stay awake -valproic acid -zonisamide This list may not describe all possible interactions. Give your health care provider a list of all the  medicines, herbs, non-prescription drugs, or dietary supplements you use. Also tell them  if you smoke, drink alcohol, or use illegal drugs. Some items may interact with your medicine. What should I watch for while using this medicine? Visit your doctor or health care professional for regular checks on your progress. This medicine is intended to be used in addition to a healthy diet and appropriate exercise. The best results are achieved this way. Do not increase or in any way change your dose without consulting your doctor or health care professional. Do not take this medicine within 6 hours of bedtime. It can keep you from getting to sleep. Avoid drinks that contain caffeine and try to stick to a regular bedtime every night. Do not stop taking this medicine suddenly. This increases the risk of seizures. This medicine can decrease sweating and increase your body temperature. Watch for signs of deceased sweating or fever. Avoid extreme heat, hot baths, and saunas. Be careful about exercising, especially in hot weather. Contact your health care provider right away if you notice a fever or decrease in sweating. You should drink plenty of fluids while taking this medicine. If you have had kidney stones in the past, this will help to reduce your chances of forming kidney stones. If you have stomach pain, with nausea or vomiting and yellowing of your eyes or skin, call your doctor immediately. You may get drowsy or dizzy. Do not drive, use machinery, or do anything that needs mental alertness until you know how this medicine affects you. Do not stand or sit up quickly, especially if you are an older patient. This reduces the risk of dizzy or fainting spells. Alcohol may increase dizziness and drowsiness. Avoid alcoholic drinks. This medicine may affect blood sugar levels. If you have diabetes, check with your doctor or health care professional before you change your diet or the dose of your diabetic  medicine. Patients and their families should watch out for worsening depression or thoughts of suicide. Also watch out for sudden changes in feelings such as feeling anxious, agitated, panicky, irritable, hostile, aggressive, impulsive, severely restless, overly excited and hyperactive, or not being able to sleep. If this happens, especially at the beginning of treatment or after a change in dose, call your health care professional. If you notice blurred vision, eye pain, or other eye problems, seek medical attention at once for an eye exam. This medicine may increase the chance of developing metabolic acidosis. If left untreated, this can cause kidney stones, bone disease, or slowed growth in children. Symptoms include breathing fast, fatigue, loss of appetite, irregular heartbeat, or loss of consciousness. Call your doctor immediately if you experience any of these side effects. Also, tell your doctor about any surgery you plan on having while taking this medicine since this may increase your risk for metabolic acidosis. Women who become pregnant while using this medicine should contact their physician immediately. You should also contact The Qsymia Pregnancy Surveillance Program which is a program that monitors pregnancies that occur during treatment. Contact the program by calling [phone]. What side effects may I notice from receiving this medicine? Side effects that you should report to your doctor or health care professional as soon as possible: -allergic reactions like skin rash, itching or hives, swelling of the face, lips, or tongue -blood in the urine -changes in vision -chest pain or chest tightness -confusion -depressed mood -difficulty breathing -dizziness -fast or irregular heartbeat -feeling anxious -irritable -loss of appetite -low blood pressure -pain in the lower back or side -pain, tingling, numbness in the hands or feet -  pain when urinating -palpitations -redness,  blistering, peeling or loosening of the skin, including inside the mouth -shortness of breath -suicidal thoughts or other mood changes -trouble passing urine or change in the amount of urine -trouble walking, dizziness, loss of balance or coordination -unusually weak or tired -vomiting Side effects that usually do not require medical attention (Report these to your doctor or health care professional if they continue or are bothersome.): -change in sex drive or performance -changes in vision -constipation -diarrhea -dry mouth -headache -nausea -tremors -trouble sleeping -upset stomach This list may not describe all possible side effects. Call your doctor for medical advice about side effects. You may report side effects to FDA at 1-800-FDA-1088. Where should I keep my medicine? Keep out of the reach of children. This medicine can be abused. Keep your medicine in a safe place to protect it from theft. Do not share this medicine with anyone. Selling or giving away this medicine is dangerous and against the law. Store at room temperature between 15 and 25 degrees C (59 and 77 degrees F). Throw away any unused medicine after the expiration date. NOTE: This sheet is a summary. It may not cover all possible information. If you have questions about this medicine, talk to your doctor, pharmacist, or health care provider.  2015, [employer]. (2011-01-26 13:56:53)

## 2015-04-06 ENCOUNTER — Other Ambulatory Visit: Payer: Self-pay | Admitting: Family Medicine

## 2015-04-06 DIAGNOSIS — R739 Hyperglycemia, unspecified: Secondary | ICD-10-CM | POA: Insufficient documentation

## 2015-04-06 LAB — COMPREHENSIVE METABOLIC PANEL
ALT: 14 IU/L (ref 0–32)
AST: 17 IU/L (ref 0–40)
Albumin/Globulin Ratio: 1.9 (ref 1.1–2.5)
Albumin: 4.4 g/dL (ref 3.5–5.5)
Alkaline Phosphatase: 57 IU/L (ref 39–117)
BUN/Creatinine Ratio: 20 (ref 9–23)
BUN: 14 mg/dL (ref 6–24)
Bilirubin Total: 0.8 mg/dL (ref 0.0–1.2)
CALCIUM: 9.6 mg/dL (ref 8.7–10.2)
CHLORIDE: 104 mmol/L (ref 97–108)
CO2: 23 mmol/L (ref 18–29)
Creatinine, Ser: 0.69 mg/dL (ref 0.57–1.00)
GFR calc Af Amer: 117 mL/min/{1.73_m2} (ref >59)
GFR, EST NON AFRICAN AMERICAN: 102 mL/min/{1.73_m2} (ref >59)
GLUCOSE: 100 mg/dL — AB (ref 65–99)
Globulin, Total: 2.3 g/dL (ref 1.5–4.5)
Potassium: 5 mmol/L (ref 3.5–5.2)
Sodium: 146 mmol/L — ABNORMAL HIGH (ref 134–144)
Total Protein: 6.7 g/dL (ref 6.0–8.5)

## 2015-04-06 LAB — CBC WITH DIFFERENTIAL/PLATELET
BASOS ABS: 0 10*3/uL (ref 0.0–0.2)
BASOS: 1 %
EOS (ABSOLUTE): 0.3 10*3/uL (ref 0.0–0.4)
Eos: 3 %
HEMOGLOBIN: 13.2 g/dL (ref 11.1–15.9)
Hematocrit: 40.3 % (ref 34.0–46.6)
IMMATURE GRANS (ABS): 0 10*3/uL (ref 0.0–0.1)
Immature Granulocytes: 0 %
Lymphocytes Absolute: 2.9 10*3/uL (ref 0.7–3.1)
Lymphs: 34 %
MCH: 29.2 pg (ref 26.6–33.0)
MCHC: 32.8 g/dL (ref 31.5–35.7)
MCV: 89 fL (ref 79–97)
MONOCYTES: 6 %
Monocytes Absolute: 0.5 10*3/uL (ref 0.1–0.9)
NEUTROS ABS: 4.8 10*3/uL (ref 1.4–7.0)
NEUTROS PCT: 56 %
Platelets: 352 10*3/uL (ref 150–379)
RBC: 4.52 x10E6/uL (ref 3.77–5.28)
RDW: 13.4 % (ref 12.3–15.4)
WBC: 8.5 10*3/uL (ref 3.4–10.8)

## 2015-04-06 LAB — TSH: TSH: 1.38 u[IU]/mL (ref 0.450–4.500)

## 2015-04-06 LAB — VITAMIN B12: VITAMIN B 12: 436 pg/mL (ref 211–946)

## 2015-04-08 LAB — URINE CULTURE

## 2015-04-08 NOTE — Progress Notes (Signed)
Patient notified of blood work and added on A1C to existing blood work and informed the patient the UC should be back tomorrow and will contact her then.

## 2015-04-09 DIAGNOSIS — R319 Hematuria, unspecified: Secondary | ICD-10-CM | POA: Insufficient documentation

## 2015-04-09 LAB — HGB A1C W/O EAG: HEMOGLOBIN A1C: 5.8 % — AB (ref 4.8–5.6)

## 2015-04-09 NOTE — Addendum Note (Signed)
Addended by: Steele Sizer F on: 04/09/2015 10:24 AM   Modules accepted: Orders

## 2015-04-10 LAB — SPECIMEN STATUS REPORT

## 2015-04-15 ENCOUNTER — Other Ambulatory Visit: Payer: Self-pay | Admitting: *Deleted

## 2015-04-15 ENCOUNTER — Ambulatory Visit (INDEPENDENT_AMBULATORY_CARE_PROVIDER_SITE_OTHER): Payer: 59 | Admitting: Obstetrics and Gynecology

## 2015-04-15 ENCOUNTER — Encounter: Payer: Self-pay | Admitting: Obstetrics and Gynecology

## 2015-04-15 VITALS — BP 102/66 | HR 67 | Resp 16 | Ht 65.0 in | Wt 216.1 lb

## 2015-04-15 DIAGNOSIS — R3129 Other microscopic hematuria: Secondary | ICD-10-CM

## 2015-04-15 DIAGNOSIS — N200 Calculus of kidney: Secondary | ICD-10-CM | POA: Diagnosis not present

## 2015-04-15 DIAGNOSIS — R109 Unspecified abdominal pain: Secondary | ICD-10-CM | POA: Diagnosis not present

## 2015-04-15 LAB — MICROSCOPIC EXAMINATION
RBC, UA: NONE SEEN /hpf (ref 0–?)
RENAL EPITHEL UA: NONE SEEN /HPF

## 2015-04-15 LAB — URINALYSIS, COMPLETE
BILIRUBIN UA: NEGATIVE
GLUCOSE, UA: NEGATIVE
NITRITE UA: NEGATIVE
PROTEIN UA: NEGATIVE
RBC UA: NEGATIVE
Specific Gravity, UA: >1.03 — ABNORMAL HIGH (ref 1.005–1.030)
UUROB: 0.2 mg/dL (ref 0.2–1.0)
pH, UA: 5 (ref 5.0–7.5)

## 2015-04-15 NOTE — Addendum Note (Signed)
Addended by: Orlene Erm on: 04/15/2015 05:23 PM   Modules accepted: Orders

## 2015-04-15 NOTE — Progress Notes (Signed)
04/15/2015 3:30 PM   Kelly [name] 09-04-1965 [id number]  Referring provider: Steele Sizer, MD [address] [place], [place] [address]  Chief Complaint  Patient presents with  . Hematuria  . Establish Care    HPI: Patient is a 49 year old female with a history of kidney stones presenting today as a referral from her primary care provider for microscopic hematuria. Per available lab results patient had one positive urine dip on 04/05/15. Patient states that she was menstruating the day the sample was obtained.  Additional complaints include bilateral lower back pain right greater than left. She reports that her first episode of renal stones was 27 years ago. She states that she typically would past 1-2 per month frequency has recently decreased over the last 3 months. She reports that her bilateral lower back pain is mostly constant but the severity waxes and wanes.  She has not previously seen a urologist for her kidney stones. She does report a history of urethral dilation as a child.  She denies any urinary symptoms including dysuria, gross hematuria, frequency or urgency. She denies fevers. She denies any vaginal symptoms.  Drinks 1 bottle of water daily.    PMH: Past Medical History  Diagnosis Date  . GERD (gastroesophageal reflux disease)   . Depression   . Migraines   . Insomnia   . Osteoarthritis   . Symptomatic menopausal or female climacteric states   . Recurrent UTI   . Sciatica of right side   . Vitamin D deficiency   . Intermittent low back pain   . Allergy   . Restless leg syndrome     Surgical History: Past Surgical History  Procedure Laterality Date  . Tonsillectomy and adenoidectomy    . Urethral stricture dilatation    . Knee surgery Left 05/2011    Dr. Rudene Christians- Arthroscopic    Home Medications:    Medication List       This list is accurate as of: 04/15/15  3:30 PM.  Always use your most recent med list.               ALPRAZolam 0.5 MG tablet  Commonly known as:  XANAX  Take 1 tablet by mouth 2 (two) times daily as needed.     cyclobenzaprine 10 MG tablet  Commonly known as:  FLEXERIL  Take 1 tablet (10 mg total) by mouth 3 (three) times daily as needed for muscle spasms.     gabapentin 300 MG capsule  Commonly known as:  NEURONTIN  TAKE ONE CAPSULE BY MOUTH THREE TIMES DAILY     IMITREX 50 MG tablet  Generic drug:  SUMAtriptan  Take 1 tablet by mouth as needed.     loratadine 10 MG tablet  Commonly known as:  CLARITIN  TAKE ONE TABLET BY MOUTH ONE TIME DAILY     Medical Compression Stockings Misc  2 Units by Does not apply route daily.     meloxicam 15 MG tablet  Commonly known as:  MOBIC  Take 1 tablet (15 mg total) by mouth daily.     omeprazole 40 MG capsule  Commonly known as:  PRILOSEC  TAKE ONE CAPSULE BY MOUTH EVERY MORNING     pramipexole 1.5 MG tablet  Commonly known as:  MIRAPEX  TAKE ONE TABLET BY MOUTH DAILY IN THE EVENING FOR RLS     promethazine 12.5 MG tablet  Commonly known as:  PHENERGAN  Take 1 tablet (12.5 mg total) by mouth every  8 (eight) hours as needed for nausea or vomiting.     traMADol 50 MG tablet  Commonly known as:  ULTRAM  Take 1 tablet (50 mg total) by mouth 2 (two) times daily as needed.     traZODone 50 MG tablet  Commonly known as:  DESYREL  Take 1 tablet by mouth as needed.        Allergies:  Allergies  Allergen Reactions  . Cephalexin   . Ciprofloxacin Hives  . Nitrofurantoin Monohyd Macro   . Penicillins   . Sulfa Antibiotics     Family History: No family history on file.  Social History:  reports that she quit smoking about 31 years ago. Her smoking use included Cigarettes. She started smoking about 33 years ago. She has a 1.5 pack-year smoking history. She has never used smokeless tobacco. She reports that she does not drink alcohol or use illicit drugs.  ROS: UROLOGY Frequent Urination?: No Hard to postpone urination?:  No Burning/pain with urination?: No Get up at night to urinate?: No Leakage of urine?: Yes Urine stream starts and stops?: No Trouble starting stream?: No Do you have to strain to urinate?: No Blood in urine?: Yes Urinary tract infection?: Yes Sexually transmitted disease?: No Injury to kidneys or bladder?: No Painful intercourse?: No Weak stream?: No Currently pregnant?: No Vaginal bleeding?: No Last menstrual period?: n  Gastrointestinal Nausea?: No Vomiting?: No Indigestion/heartburn?: Yes Diarrhea?: No Constipation?: No  Constitutional Fever: No Night sweats?: Yes Weight loss?: No Fatigue?: Yes  Skin Skin rash/lesions?: No Itching?: No  Eyes Blurred vision?: No Double vision?: No  Ears/Nose/Throat Sore throat?: No Sinus problems?: No  Hematologic/Lymphatic Swollen glands?: No Easy bruising?: No  Cardiovascular Leg swelling?: Yes Chest pain?: No  Respiratory Cough?: No Shortness of breath?: No  Endocrine Excessive thirst?: No  Musculoskeletal Back pain?: Yes Joint pain?: Yes  Neurological Headaches?: No Dizziness?: No  Psychologic Depression?: No Anxiety?: No  Physical Exam: BP 102/66 mmHg  Pulse 67  Resp 16  Ht 5\' 5"  (1.651 m)  Wt 216 lb 1.6 oz (98.022 kg)  BMI 35.96 kg/m2  LMP 04/02/2015 (Exact Date)  Constitutional:  Alert and oriented, No acute distress. HEENT: [place] AT, moist mucus membranes.  Trachea midline, no masses. Cardiovascular: No clubbing, cyanosis, or edema. Respiratory: Normal respiratory effort, no increased work of breathing. GI: Abdomen is soft, nontender, nondistended, no abdominal masses GU: Bilateral CVA tenderness.  Skin: No rashes, bruises or suspicious lesions. Lymph: No cervical or inguinal adenopathy. Neurologic: Grossly intact, no focal deficits, moving all 4 extremities. Psychiatric: Normal mood and affect.  Laboratory Data: Lab Results  Component Value Date   WBC 8.5 04/05/2015   HCT 40.3  04/05/2015    Lab Results  Component Value Date   CREATININE 0.69 04/05/2015    No results found for: PSA  No results found for: TESTOSTERONE  Lab Results  Component Value Date   HGBA1C 5.8* 04/05/2015    Urinalysis    Component Value Date/Time   BILIRUBINUR negative 04/05/2015 1238   PROTEINUR negative 04/05/2015 1238   UROBILINOGEN 0.2 04/05/2015 1238   NITRITE negative 04/05/2015 1238   LEUKOCYTESUR small (1+)* 04/05/2015 1238    Pertinent Imaging:   Assessment & Plan:    1. Microscopic hematuria- History of one positive urine dip for RBCs. UA today showing 0 RBCs, 0-5 WBCs, 0-10 epithelial cells and moderate bacteria. A she states that she was straining on previous positive urine dip. At this time the patient does not  meet criteria for microscopic hematuria workup. We will recheck a UA at her next visit - Urinalysis, Complete   2. Flank Pain-  Patient had bilateral CVA tenderness on exam today. I suspect her symptoms are related to her recurrent kidney stones. CT stone protocol ordered. Patient instructed to drink a lot of clear fluids.  3. History of Kidney Stones- patient has approximately 27 year history of recurrent kidney stones. She states that she has never previously seen by urologist for this. CT stone protocol ordered.  Return for CT scan result.  Herbert Moors, [facility] [address] [place], [place] [address] [phone]

## 2015-04-16 ENCOUNTER — Other Ambulatory Visit: Payer: Self-pay | Admitting: *Deleted

## 2015-04-16 DIAGNOSIS — R319 Hematuria, unspecified: Secondary | ICD-10-CM

## 2015-04-17 ENCOUNTER — Other Ambulatory Visit: Payer: Self-pay | Admitting: Radiology

## 2015-04-17 DIAGNOSIS — R3129 Other microscopic hematuria: Secondary | ICD-10-CM

## 2015-04-24 ENCOUNTER — Ambulatory Visit
Admission: RE | Admit: 2015-04-24 | Discharge: 2015-04-24 | Disposition: A | Discharge status: Home / Self Care | Payer: 59 | Source: Ambulatory Visit | Attending: Obstetrics and Gynecology | Admitting: Obstetrics and Gynecology

## 2015-04-24 DIAGNOSIS — R3129 Other microscopic hematuria: Secondary | ICD-10-CM | POA: Diagnosis present

## 2015-04-27 ENCOUNTER — Other Ambulatory Visit: Payer: Self-pay | Admitting: Family Medicine

## 2015-04-29 ENCOUNTER — Encounter: Payer: Self-pay | Admitting: Family Medicine

## 2015-04-29 ENCOUNTER — Ambulatory Visit (INDEPENDENT_AMBULATORY_CARE_PROVIDER_SITE_OTHER): Payer: 59 | Admitting: Family Medicine

## 2015-04-29 VITALS — BP 112/80 | HR 92 | Temp 98.3°F | Resp 16 | Ht 65.0 in | Wt 214.5 lb

## 2015-04-29 DIAGNOSIS — K219 Gastro-esophageal reflux disease without esophagitis: Secondary | ICD-10-CM

## 2015-04-29 DIAGNOSIS — E8881 Metabolic syndrome: Secondary | ICD-10-CM | POA: Diagnosis not present

## 2015-04-29 DIAGNOSIS — F418 Other specified anxiety disorders: Secondary | ICD-10-CM

## 2015-04-29 DIAGNOSIS — G4700 Insomnia, unspecified: Secondary | ICD-10-CM | POA: Diagnosis not present

## 2015-04-29 DIAGNOSIS — Z1211 Encounter for screening for malignant neoplasm of colon: Secondary | ICD-10-CM | POA: Diagnosis not present

## 2015-04-29 DIAGNOSIS — G2581 Restless legs syndrome: Secondary | ICD-10-CM | POA: Diagnosis not present

## 2015-04-29 DIAGNOSIS — M5417 Radiculopathy, lumbosacral region: Secondary | ICD-10-CM

## 2015-04-29 DIAGNOSIS — G43009 Migraine without aura, not intractable, without status migrainosus: Secondary | ICD-10-CM

## 2015-04-29 DIAGNOSIS — M5416 Radiculopathy, lumbar region: Secondary | ICD-10-CM

## 2015-04-29 DIAGNOSIS — F329 Major depressive disorder, single episode, unspecified: Secondary | ICD-10-CM

## 2015-04-29 DIAGNOSIS — F32A Depression, unspecified: Secondary | ICD-10-CM

## 2015-04-29 DIAGNOSIS — F419 Anxiety disorder, unspecified: Secondary | ICD-10-CM

## 2015-04-29 MED ORDER — MELOXICAM 15 MG PO TABS: 15.0000 mg | ORAL_TABLET | Freq: Every day | ORAL | Status: DC

## 2015-04-29 MED ORDER — PREDNISONE 10 MG (48) PO TBPK: ORAL_TABLET | Freq: Every day | ORAL | Status: DC

## 2015-04-29 NOTE — Progress Notes (Signed)
Name: Kelly [name]   MRN: [id number]    DOB: 1966/04/25   Date:04/29/2015       Progress Note  Subjective  Chief Complaint  Chief Complaint  Patient presents with  . Medication Management    4 month F/U  . Gastroesophageal Reflux    Well controlled  . RLS    Well controlled with medication  . Anxiety    Not had to take medication due to starting a new job and being more relaxed    HPI  GERD: she has been taking Omeprazole daily and states symptoms only when she eats right before bed time, or eats too much during a meal. No regurgitation. Symptoms are usually described as a burning sensation on substernal area  RLs: doing well on medication, denies side effects, still has occasional kicking when she first goes to bed, but able to fall asleep and stay asleep  Anxiety/Depression: she has been off medication, she states her mood has been going up and down again. Crying during commercials. 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   Right low back pain: with radiculitis, warm sensation going down right leg, no weakness. She had an MRI a few years ago and went to [facility] to see Neurosurgeon, no injections at the time. She has herniated disc,  Symptoms have been constant for the past four months. She did not respond to  prednisone taper give to her in June, but is taking Gabapentin and Tramadol prn, we will check MRI. She has had PT in the past and has been doing the back exercises at home.  Migraine headaches: no recent episodes - none in the past month. When she has an episode it resolves with IMitrex and staying in a dark room. Pain is usually in the  temporal area.  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 Hematuria 04/09/2015  . Hyperglycemia 04/06/2015  . Right  lumbar radiculitis 12/28/2014  . Anxiety and depression 12/27/2014  . Insomnia, persistent 12/27/2014  . Gastro-esophageal reflux disease without esophagitis 12/27/2014  . Bulge of lumbar disc without myelopathy 12/27/2014  . Dysmetabolic syndrome 78/58/8502  . Migraine without aura and responsive to treatment 12/27/2014  . Osteoarthrosis 12/27/2014  . Obesity (BMI 30-39.9) 12/27/2014  . Restless leg 12/27/2014  . Allergic rhinitis, seasonal 12/27/2014    Past Surgical History  Procedure Laterality Date  . Tonsillectomy and adenoidectomy    . Urethral stricture dilatation    . Knee surgery Left 05/2011    Dr. Rudene Christians- Arthroscopic    History reviewed. No pertinent family history.  Social History   Social History  . Marital Status: Married    Spouse Name: N/A  . Number of Children: N/A  . Years of Education: N/A   Occupational History  . Not on file.   Social History Main Topics  . Smoking status: Former Smoker -- 0.75 packs/day for 2 years    Types: Cigarettes    Start date: 07/06/1981    Quit date: 07/07/1983  . Smokeless tobacco: Never Used  . Alcohol Use: No  . Drug Use: No  . Sexual Activity:    Partners: Male   Other Topics Concern  . Not on file   Social History Narrative  Current outpatient prescriptions:  .  ALPRAZolam (XANAX) 0.5 MG tablet, Take 1 tablet by mouth 2 (two) times daily as needed., Disp: , Rfl:  .  Elastic Bandages & Supports (MEDICAL COMPRESSION STOCKINGS) MISC, 2 Units by Does not apply route daily., Disp: 1 each, Rfl: 2 .  gabapentin (NEURONTIN) 300 MG capsule, TAKE ONE CAPSULE BY MOUTH THREE TIMES DAILY, Disp: 90 capsule, Rfl: 1 .  loratadine (CLARITIN) 10 MG tablet, TAKE ONE TABLET BY MOUTH ONE TIME DAILY, Disp: 30 tablet, Rfl: 0 .  meloxicam (MOBIC) 15 MG tablet, Take 1 tablet (15 mg total) by mouth daily., Disp: 30 tablet, Rfl: 2 .  omeprazole (PRILOSEC) 40 MG capsule, TAKE ONE CAPSULE BY MOUTH EVERY MORNING, Disp: 30 capsule,  Rfl: 5 .  pramipexole (MIRAPEX) 1.5 MG tablet, TAKE ONE TABLET BY MOUTH DAILY IN THE EVENING FOR RLS, Disp: 30 tablet, Rfl: 2 .  promethazine (PHENERGAN) 12.5 MG tablet, Take 1 tablet (12.5 mg total) by mouth every 8 (eight) hours as needed for nausea or vomiting., Disp: 12 tablet, Rfl: 0 .  SUMAtriptan (IMITREX) 50 MG tablet, Take 1 tablet by mouth as needed., Disp: , Rfl:  .  cyclobenzaprine (FLEXERIL) 10 MG tablet, Take 1 tablet (10 mg total) by mouth 3 (three) times daily as needed for muscle spasms. (Patient not taking: Reported on 04/29/2015), Disp: 30 tablet, Rfl: 0 .  traMADol (ULTRAM) 50 MG tablet, Take 1 tablet (50 mg total) by mouth 2 (two) times daily as needed. (Patient not taking: Reported on 04/15/2015), Disp: 90 tablet, Rfl: 0 .  traZODone (DESYREL) 50 MG tablet, Take 1 tablet by mouth as needed., Disp: , Rfl:   Allergies  Allergen Reactions  . Cephalexin   . Ciprofloxacin Hives  . Nitrofurantoin Monohyd Macro   . Penicillins   . Sulfa Antibiotics      ROS  Constitutional: Negative for fever, with mild  weight change.  Respiratory: Negative for cough and shortness of breath.   Cardiovascular: Negative for chest pain or palpitations.  Gastrointestinal: Negative for abdominal pain, no bowel changes.  Musculoskeletal: Negative for gait problem or joint swelling.  Skin: Negative for rash.  Neurological: Negative for dizziness or headache.  No other specific complaints in a complete review of systems (except as listed in HPI above).  Objective  Filed Vitals:   04/29/15 0944  BP: 112/80  Pulse: 92  Temp: 98.3 F (36.8 C)  TempSrc: Oral  Resp: 16  Height: 5\' 5"  (1.651 m)  Weight: 214 lb 8 oz (97.297 kg)  SpO2: 98%    Body mass index is 35.69 kg/(m^2).  Physical Exam  Constitutional: Patient appears well-developed and well-nourished. Obese No distress.  HEENT: head atraumatic, normocephalic, pupils equal and reactive to light,  neck supple, throat within  normal limits Cardiovascular: Normal rate, regular rhythm and normal heart sounds.  No murmur heard. No BLE edema. Pulmonary/Chest: Effort normal and breath sounds normal. No respiratory distress. Abdominal: Soft.  There is no tenderness. Psychiatric: Patient has a normal mood and affect. behavior is normal. Judgment and thought content normal. Muscular Skeletal: pain during palpation of lumbar spine, negative straight leg raise  Recent Results (from the past 2160 hour(s))  POCT Urinalysis Dipstick     Status: Abnormal   Collection Time: 04/05/15 12:38 PM  Result Value Ref Range   Color, UA dark    Clarity, UA clear    Glucose, UA negative    Bilirubin, UA negative    Ketones, UA negative  Spec Grav, UA 1.015    Blood, UA 3+    pH, UA 5.5    Protein, UA negative    Urobilinogen, UA 0.2    Nitrite, UA negative    Leukocytes, UA small (1+) (A) Negative  Vitamin B12     Status: None   Collection Time: 04/05/15 12:41 PM  Result Value Ref Range   Vitamin B-12 436 211 - 946 pg/mL  Comprehensive metabolic panel     Status: Abnormal   Collection Time: 04/05/15 12:41 PM  Result Value Ref Range   Glucose 100 (H) 65 - 99 mg/dL   BUN 14 6 - 24 mg/dL   Creatinine, Ser 0.69 0.57 - 1.00 mg/dL   GFR calc non Af Amer 102 >59 mL/min/1.73   GFR calc Af Amer 117 >59 mL/min/1.73   BUN/Creatinine Ratio 20 9 - 23   Sodium 146 (H) 134 - 144 mmol/L   Potassium 5.0 3.5 - 5.2 mmol/L   Chloride 104 97 - 108 mmol/L   CO2 23 18 - 29 mmol/L   Calcium 9.6 8.7 - 10.2 mg/dL   Total Protein 6.7 6.0 - 8.5 g/dL   Albumin 4.4 3.5 - 5.5 g/dL   Globulin, Total 2.3 1.5 - 4.5 g/dL   Albumin/Globulin Ratio 1.9 1.1 - 2.5   Bilirubin Total 0.8 0.0 - 1.2 mg/dL   Alkaline Phosphatase 57 39 - 117 IU/L   AST 17 0 - 40 IU/L   ALT 14 0 - 32 IU/L  CBC with Differential/Platelet     Status: None   Collection Time: 04/05/15 12:41 PM  Result Value Ref Range   WBC 8.5 3.4 - 10.8 x10E3/uL   RBC 4.52 3.77 - 5.28  x10E6/uL   Hemoglobin 13.2 11.1 - 15.9 g/dL   Hematocrit 40.3 34.0 - 46.6 %   MCV 89 79 - 97 fL   MCH 29.2 26.6 - 33.0 pg   MCHC 32.8 31.5 - 35.7 g/dL   RDW 13.4 12.3 - 15.4 %   Platelets 352 150 - 379 x10E3/uL   Neutrophils 56 %   Lymphs 34 %   Monocytes 6 %   Eos 3 %   Basos 1 %   Neutrophils Absolute 4.8 1.4 - 7.0 x10E3/uL   Lymphocytes Absolute 2.9 0.7 - 3.1 x10E3/uL   Monocytes Absolute 0.5 0.1 - 0.9 x10E3/uL   EOS (ABSOLUTE) 0.3 0.0 - 0.4 x10E3/uL   Basophils Absolute 0.0 0.0 - 0.2 x10E3/uL   Immature Granulocytes 0 %   Immature Grans (Abs) 0.0 0.0 - 0.1 x10E3/uL  TSH     Status: None   Collection Time: 04/05/15 12:41 PM  Result Value Ref Range   TSH 1.380 0.450 - 4.500 uIU/mL  Hgb A1c w/o eAG     Status: Abnormal   Collection Time: 04/05/15 12:41 PM  Result Value Ref Range   Hgb A1c MFr Bld 5.8 (H) 4.8 - 5.6 %    Comment:          Pre-diabetes: 5.7 - 6.4          Diabetes: >6.4          Glycemic control for adults with diabetes: <7.0   Specimen status report     Status: None   Collection Time: 04/05/15 12:41 PM  Result Value Ref Range   specimen status report Comment     Comment: Written Authorization Written Authorization Written Authorization Received. Authorization received from [employer] [place] 04-10-2015 Logged by Guadalupe Maple   Urine culture  Status: None   Collection Time: 04/05/15 12:46 PM  Result Value Ref Range   Urine Culture, Routine Final report    Urine Culture result 1 Comment     Comment: Culture shows less than 10,000 colony forming units of bacteria per milliliter of urine. This colony count is not generally considered to be clinically significant.   Urinalysis, Complete     Status: Abnormal   Collection Time: 04/15/15  2:16 PM  Result Value Ref Range   Specific Gravity, UA >1.030 (H) 1.005 - 1.030   pH, UA 5.0 5.0 - 7.5   Color, UA Yellow Yellow   Appearance Ur Clear Clear   Leukocytes, UA Trace (A) Negative   Protein, UA Negative  Negative/Trace   Glucose, UA Negative Negative   Ketones, UA 1+ (A) Negative   RBC, UA Negative Negative   Bilirubin, UA Negative Negative   Urobilinogen, Ur 0.2 0.2 - 1.0 mg/dL   Nitrite, UA Negative Negative   Microscopic Examination See below:   Microscopic Examination     Status: Abnormal   Collection Time: 04/15/15  2:16 PM  Result Value Ref Range   WBC, UA 0-5 0 -  5 /hpf   RBC, UA None seen 0 -  2 /hpf   Epithelial Cells (non renal) 0-10 0 - 10 /hpf   Renal Epithel, UA None seen None seen /hpf   Bacteria, UA Moderate (A) None seen/Few    PHQ2/9: Depression screen [facility] 2/9 04/29/2015 04/05/2015 12/28/2014  Decreased Interest 0 0 0  Down, Depressed, Hopeless 0 1 0  PHQ - 2 Score 0 1 0     Fall Risk: Fall Risk  04/29/2015 04/05/2015 12/28/2014  Falls in the past year? Yes Yes Yes  Number falls in past yr: 1 1 1   Injury with Fall? No No Yes  Follow up - - Falls evaluation completed    Assessment & Plan  1. Dysmetabolic syndrome  Last HYIF0Y stable, continue life style modification  2. Restless leg  Continue medication   3. Colon cancer screening  She refused to have colonoscopy, discussed Cologuard - Cologuard  4. Anxiety and depression  Discussed resuming medications, but she wants to hold off for now  5. Insomnia  Doing better, tired from working all day, not taking medications every night now  6. Gastro-esophageal reflux disease without esophagitis  Under control   7. Migraine without aura and responsive to treatment  Doing well, on prn medication, also takes Gabapentin for back and it may be helping with migraine also  8. Right lumbar radiculitis  She failed  Prednisone taper,we will check MRI - meloxicam (MOBIC) 15 MG tablet; Take 1 tablet (15 mg total) by mouth daily.  Dispense: 30 tablet; Refill: 2 -MRI lumbar spine

## 2015-05-02 ENCOUNTER — Encounter: Payer: Self-pay | Admitting: Obstetrics and Gynecology

## 2015-05-02 ENCOUNTER — Ambulatory Visit (INDEPENDENT_AMBULATORY_CARE_PROVIDER_SITE_OTHER): Payer: 59 | Admitting: Obstetrics and Gynecology

## 2015-05-02 ENCOUNTER — Ambulatory Visit: Payer: 59 | Admitting: Obstetrics and Gynecology

## 2015-05-02 VITALS — BP 101/62 | HR 62 | Resp 16 | Ht 65.0 in | Wt 215.2 lb

## 2015-05-02 DIAGNOSIS — N289 Disorder of kidney and ureter, unspecified: Secondary | ICD-10-CM | POA: Diagnosis not present

## 2015-05-02 DIAGNOSIS — R109 Unspecified abdominal pain: Secondary | ICD-10-CM | POA: Diagnosis not present

## 2015-05-02 NOTE — Progress Notes (Signed)
05/02/2015 7:48 AM   [name] 01-13-1966 [id number]  Referring provider: Steele Sizer, MD [address] [place], [place] [address]  Chief Complaint  Patient presents with  . Results  . Hematuria    HPI: Patient is a 49 year old female presenting today to review her CT renal stone study results performed for further evaluation of flank pain. She reports no changes in urinary symptoms.  Previous Complaint History: Patient is a 49 year old female with a history of kidney stones presenting today as a referral from her primary care provider for microscopic hematuria. Per available lab results patient had one positive urine dip on 04/05/15. Patient states that she was menstruating the day the sample was obtained.  Additional complaints include bilateral lower back pain right greater than left. She reports that her first episode of renal stones was 27 years ago. She states that she typically would past 1-2 per month frequency has recently decreased over the last 3 months. She reports that her bilateral lower back pain is mostly constant but the severity waxes and wanes.  She has not previously seen a urologist for her kidney stones. She does report a history of urethral dilation as a child.  She denies any urinary symptoms including dysuria, gross hematuria, frequency or urgency. She denies fevers. She denies any vaginal symptoms.  Drinks 1 bottle of water daily.    PMH: Past Medical History  Diagnosis Date  . GERD (gastroesophageal reflux disease)   . Depression   . Migraines   . Insomnia   . Osteoarthritis   . Symptomatic menopausal or female climacteric states   . Recurrent UTI   . Sciatica of right side   . Vitamin D deficiency   . Intermittent low back pain   . Allergy   . Restless leg syndrome     Surgical History: Past Surgical History  Procedure Laterality Date  . Tonsillectomy and adenoidectomy    . Urethral stricture dilatation    . Knee  surgery Left 05/2011    Dr. Rudene Christians- Arthroscopic    Home Medications:    Medication List       This list is accurate as of: 05/02/15 11:59 PM.  Always use your most recent med list.               ALPRAZolam 0.5 MG tablet  Commonly known as:  XANAX  Take 1 tablet by mouth 2 (two) times daily as needed.     cyclobenzaprine 10 MG tablet  Commonly known as:  FLEXERIL  Take 1 tablet (10 mg total) by mouth 3 (three) times daily as needed for muscle spasms.     gabapentin 300 MG capsule  Commonly known as:  NEURONTIN  TAKE ONE CAPSULE BY MOUTH THREE TIMES DAILY     IMITREX 50 MG tablet  Generic drug:  SUMAtriptan  Take 1 tablet by mouth as needed.     loratadine 10 MG tablet  Commonly known as:  CLARITIN  TAKE ONE TABLET BY MOUTH ONE TIME DAILY     Medical Compression Stockings Misc  2 Units by Does not apply route daily.     meloxicam 15 MG tablet  Commonly known as:  MOBIC  Take 1 tablet (15 mg total) by mouth daily.     omeprazole 40 MG capsule  Commonly known as:  PRILOSEC  TAKE ONE CAPSULE BY MOUTH EVERY MORNING     pramipexole 1.5 MG tablet  Commonly known as:  MIRAPEX  TAKE ONE TABLET BY  MOUTH DAILY IN THE EVENING FOR RLS     promethazine 12.5 MG tablet  Commonly known as:  PHENERGAN  Take 1 tablet (12.5 mg total) by mouth every 8 (eight) hours as needed for nausea or vomiting.     traMADol 50 MG tablet  Commonly known as:  ULTRAM  Take 1 tablet (50 mg total) by mouth 2 (two) times daily as needed.     traZODone 50 MG tablet  Commonly known as:  DESYREL  Take 1 tablet by mouth as needed.        Allergies:  Allergies  Allergen Reactions  . Cephalexin   . Ciprofloxacin Hives  . Nitrofurantoin Monohyd Macro   . Penicillins   . Sulfa Antibiotics     Family History: Family History  Problem Relation Age of Onset  . Cancer Paternal Grandmother     Social History:  reports that she quit smoking about 31 years ago. Her smoking use included  Cigarettes. She started smoking about 33 years ago. She has a 1.5 pack-year smoking history. She has never used smokeless tobacco. She reports that she does not drink alcohol or use illicit drugs.  ROS: UROLOGY Frequent Urination?: No Hard to postpone urination?: No Burning/pain with urination?: No Get up at night to urinate?: No Leakage of urine?: Yes Urine stream starts and stops?: No Trouble starting stream?: No Do you have to strain to urinate?: No Blood in urine?: No Urinary tract infection?: No Sexually transmitted disease?: No Injury to kidneys or bladder?: No Painful intercourse?: No Weak stream?: No Currently pregnant?: No Vaginal bleeding?: No Last menstrual period?: 04/26/2015  Gastrointestinal Nausea?: No Vomiting?: No Indigestion/heartburn?: No Diarrhea?: No Constipation?: No  Constitutional Fever: No Night sweats?: No Weight loss?: No Fatigue?: No  Skin Skin rash/lesions?: No Itching?: No  Eyes Blurred vision?: No Double vision?: No  Ears/Nose/Throat Sore throat?: No Sinus problems?: No  Hematologic/Lymphatic Swollen glands?: No Easy bruising?: No  Cardiovascular Leg swelling?: Yes Chest pain?: No  Respiratory Cough?: No Shortness of breath?: No  Endocrine Excessive thirst?: No  Musculoskeletal Back pain?: Yes Joint pain?: Yes  Neurological Headaches?: No Dizziness?: No  Psychologic Depression?: No Anxiety?: No  Physical Exam: BP 101/62 mmHg  Pulse 62  Resp 16  Ht 5\' 5"  (1.651 m)  Wt 215 lb 3.2 oz (97.614 kg)  BMI 35.81 kg/m2  LMP 04/24/2015  Constitutional:  Alert and oriented, No acute distress. HEENT: [place] AT, moist mucus membranes.  Trachea midline, no masses. Cardiovascular: No clubbing, cyanosis, or edema. Respiratory: Normal respiratory effort, no increased work of breathing. Skin: No rashes, bruises or suspicious lesions. Lymph: No cervical or inguinal adenopathy. Neurologic: Grossly intact, no focal deficits,  moving all 4 extremities. Psychiatric: Normal mood and affect.  Laboratory Data:   Urinalysis    Component Value Date/Time   GLUCOSEU Negative 04/15/2015 1416   BILIRUBINUR Negative 04/15/2015 1416   BILIRUBINUR negative 04/05/2015 1238   PROTEINUR negative 04/05/2015 1238   UROBILINOGEN 0.2 04/05/2015 1238   NITRITE Negative 04/15/2015 1416   NITRITE negative 04/05/2015 1238   LEUKOCYTESUR Trace* 04/15/2015 1416   LEUKOCYTESUR small (1+)* 04/05/2015 1238    Pertinent Imaging: EXAM: CT ABDOMEN AND PELVIS WITHOUT CONTRAST  TECHNIQUE: Multidetector CT imaging of the abdomen and pelvis was performed following the standard protocol without IV contrast.  COMPARISON: None.  FINDINGS: The lung bases are clear. The liver is unremarkable in the unenhanced state. No calcified gallstones are seen. The pancreas is normal in size on this unenhanced  study and the pancreatic duct is not dilated. The adrenal glands and spleen are unremarkable. The stomach is decompressed. There is low-attenuation within both renal collecting systems of which appears most typical of multiple parapelvic renal cysts rather than hydronephrosis, not extending into the calices. In addition, the proximal ureters are not dilated. No renal calculi are noted. The abdominal aorta is normal in caliber. No adenopathy is seen.  The distal ureters are not dilated and no distal ureteral calculus is seen. The urinary bladder is not well distended but no abnormality is noted. The uterus is normal in size. There do appear to be small left ovarian follicles present. No free fluid is seen within the pelvis. The colon is largely decompressed. No significant colonic diverticula are noted. The terminal ileum is unremarkable. The appendix is not definitely seen, but no inflammatory process is noted. A tampon is noted within the vagina. Small groin nodes are present. The lumbar vertebrae are in normal alignment. There  is degenerative disc disease particularly at L5-S1.  IMPRESSION: 1. Right flank pain is seen. No renal or ureteral calculi are noted. 2. Low-attenuation within both renal collecting systems is most consistent with bilateral parapelvic cysts. CT with IV contrast would be beneficial in confirming that finding. 3. No explanation for the patient's Electronically Signed  By: Ivar Drape M.D.  On: 04/24/2015 15:35  Assessment & Plan:   1. Renal lesion-Low-attenuation within both renal collecting systems is most consistent with bilateral parapelvic cysts. CT with IV contrast would be beneficial in confirming that finding. CT with contrast ordered for further evaluation.   There are no diagnoses linked to this encounter.  Return for f/u for CT results; recheck UA.  These notes generated with voice recognition software. I apologize for typographical errors.  Herbert Moors, [facility] [address] [place], [place] [address] [phone]

## 2015-05-03 ENCOUNTER — Other Ambulatory Visit: Payer: Self-pay | Admitting: Obstetrics and Gynecology

## 2015-05-03 DIAGNOSIS — N289 Disorder of kidney and ureter, unspecified: Secondary | ICD-10-CM

## 2015-05-13 ENCOUNTER — Encounter: Payer: Self-pay | Admitting: Family Medicine

## 2015-05-13 ENCOUNTER — Telehealth: Payer: Self-pay | Admitting: Obstetrics and Gynecology

## 2015-05-13 ENCOUNTER — Ambulatory Visit (INDEPENDENT_AMBULATORY_CARE_PROVIDER_SITE_OTHER): Payer: 59 | Admitting: Family Medicine

## 2015-05-13 VITALS — BP 132/68 | HR 81 | Temp 97.4°F | Resp 18 | Ht 65.0 in | Wt 209.6 lb

## 2015-05-13 DIAGNOSIS — M5417 Radiculopathy, lumbosacral region: Secondary | ICD-10-CM

## 2015-05-13 DIAGNOSIS — R232 Flushing: Secondary | ICD-10-CM

## 2015-05-13 DIAGNOSIS — N951 Menopausal and female climacteric states: Secondary | ICD-10-CM

## 2015-05-13 DIAGNOSIS — M5416 Radiculopathy, lumbar region: Secondary | ICD-10-CM

## 2015-05-13 DIAGNOSIS — E669 Obesity, unspecified: Secondary | ICD-10-CM

## 2015-05-13 DIAGNOSIS — N924 Excessive bleeding in the premenopausal period: Secondary | ICD-10-CM

## 2015-05-13 MED ORDER — CLONIDINE HCL 0.1 MG PO TABS: 0.1000 mg | ORAL_TABLET | Freq: Every evening | ORAL | Status: DC | PRN

## 2015-05-13 NOTE — Telephone Encounter (Signed)
Angel from [facility] called and orders should be for abdomen only with and without contrast for Wednesday 11/9.

## 2015-05-13 NOTE — Progress Notes (Signed)
Name: Kelly [name]   MRN: [id number]    DOB: 14-Aug-1965   Date:05/13/2015       Progress Note  Subjective  Chief Complaint  Chief Complaint  Patient presents with  . Menstrual Problem    Patient states having a period every 2 weeks.  She states it last 1 week and is heavy,clotting.  She is experiencing fatigue    HPI  Perimenopausal symptoms: she has noticed that her cycles are heavy with clots and every two weeks, lasting about one week, she is now feeling drained.  Worse over the past couple of months, but irregular for several months. She is also have hot flashes and night sweats, symptoms worse at night now. She does not want labs at this time  Right low back pain: with radiculitis, warm sensation going down right leg, no weakness. She had an MRI a few years ago and went to [facility] to see Neurosurgeon, no injections at the time. She has herniated disc, Symptoms have been constant for the past fiver months. She did not respond to prednisone taper give to her in June, but is taking Gabapentin and Tramadol prn, she will have a repeat  MRI next week. She has had PT in the past and has been doing the back exercises at home.   Obesity: she lost 6 lbs since last visit, she is working at a retirement community, she eats with the residents and is no longer eating dinner late in the evening but still has a small snack before bed.     Patient Active Problem List   Diagnosis Date Noted  . Hematuria 04/09/2015  . Hyperglycemia 04/06/2015  . Right lumbar radiculitis 12/28/2014  . Anxiety and depression 12/27/2014  . Insomnia, persistent 12/27/2014  . Gastro-esophageal reflux disease without esophagitis 12/27/2014  . Bulge of lumbar disc without myelopathy 12/27/2014  . Dysmetabolic syndrome 67/61/9509  . Migraine without aura and responsive to treatment 12/27/2014  . Osteoarthrosis 12/27/2014  . Obesity (BMI 30-39.9) 12/27/2014  . Restless leg 12/27/2014  . Allergic rhinitis,  seasonal 12/27/2014    Past Surgical History  Procedure Laterality Date  . Tonsillectomy and adenoidectomy    . Urethral stricture dilatation    . Knee surgery Left 05/2011    Dr. Rudene Christians- Arthroscopic    Family History  Problem Relation Age of Onset  . Cancer Paternal Grandmother     Social History   Social History  . Marital Status: Married    Spouse Name: N/A  . Number of Children: N/A  . Years of Education: N/A   Occupational History  . Not on file.   Social History Main Topics  . Smoking status: Former Smoker -- 0.75 packs/day for 2 years    Types: Cigarettes    Start date: 07/06/1981    Quit date: 07/07/1983  . Smokeless tobacco: Never Used  . Alcohol Use: No  . Drug Use: No  . Sexual Activity:    Partners: Male   Other Topics Concern  . Not on file   Social History Narrative     Current outpatient prescriptions:  .  ALPRAZolam (XANAX) 0.5 MG tablet, Take 1 tablet by mouth 2 (two) times daily as needed., Disp: , Rfl:  .  cyclobenzaprine (FLEXERIL) 10 MG tablet, Take 1 tablet (10 mg total) by mouth 3 (three) times daily as needed for muscle spasms. (Patient not taking: Reported on 05/02/2015), Disp: 30 tablet, Rfl: 0 .  Elastic Bandages & Supports (MEDICAL COMPRESSION STOCKINGS) Frederika, 2  Units by Does not apply route daily., Disp: 1 each, Rfl: 2 .  gabapentin (NEURONTIN) 300 MG capsule, TAKE ONE CAPSULE BY MOUTH THREE TIMES DAILY, Disp: 90 capsule, Rfl: 1 .  loratadine (CLARITIN) 10 MG tablet, TAKE ONE TABLET BY MOUTH ONE TIME DAILY, Disp: 30 tablet, Rfl: 0 .  meloxicam (MOBIC) 15 MG tablet, Take 1 tablet (15 mg total) by mouth daily., Disp: 30 tablet, Rfl: 2 .  omeprazole (PRILOSEC) 40 MG capsule, TAKE ONE CAPSULE BY MOUTH EVERY MORNING, Disp: 30 capsule, Rfl: 5 .  pramipexole (MIRAPEX) 1.5 MG tablet, TAKE ONE TABLET BY MOUTH DAILY IN THE EVENING FOR RLS, Disp: 30 tablet, Rfl: 2 .  promethazine (PHENERGAN) 12.5 MG tablet, Take 1 tablet (12.5 mg total) by mouth  every 8 (eight) hours as needed for nausea or vomiting., Disp: 12 tablet, Rfl: 0 .  SUMAtriptan (IMITREX) 50 MG tablet, Take 1 tablet by mouth as needed., Disp: , Rfl:  .  traMADol (ULTRAM) 50 MG tablet, Take 1 tablet (50 mg total) by mouth 2 (two) times daily as needed., Disp: 90 tablet, Rfl: 0 .  traZODone (DESYREL) 50 MG tablet, Take 1 tablet by mouth as needed., Disp: , Rfl:   Allergies  Allergen Reactions  . Cephalexin   . Ciprofloxacin Hives  . Nitrofurantoin Monohyd Macro   . Penicillins   . Sulfa Antibiotics      ROS  Constitutional: Negative for fever, positive for weight change.  Respiratory: Negative for cough and shortness of breath.   Cardiovascular: Negative for chest pain or palpitations.  Gastrointestinal: Negative for abdominal pain, no bowel changes.  Musculoskeletal: Negative for gait problem or joint swelling.  Skin: Negative for rash.  Neurological: Negative for dizziness or headache.  No other specific complaints in a complete review of systems (except as listed in HPI above).  Objective  Filed Vitals:   05/13/15 0913  BP: 132/68  Pulse: 81  Temp: 97.4 F (36.3 C)  TempSrc: Oral  Resp: 18  Height: 5\' 5"  (1.651 m)  Weight: 209 lb 9.6 oz (95.074 kg)  SpO2: 98%    Body mass index is 34.88 kg/(m^2).  Physical Exam  Constitutional: Patient appears well-developed and well-nourished. Obese  No distress.  HEENT: head atraumatic, normocephalic, pupils equal and reactive to light, neck supple, throat within normal limits Cardiovascular: Normal rate, regular rhythm and normal heart sounds.  No murmur heard. No BLE edema. Pulmonary/Chest: Effort normal and breath sounds normal. No respiratory distress. Abdominal: Soft.  There is no tenderness. Psychiatric: Patient has a normal mood and affect. behavior is normal. Judgment and thought content normal. Muscular Skeletal: pain during palpation lumbar spine, negative straight leg raise  Recent Results (from  the past 2160 hour(s))  POCT Urinalysis Dipstick     Status: Abnormal   Collection Time: 04/05/15 12:38 PM  Result Value Ref Range   Color, UA dark    Clarity, UA clear    Glucose, UA negative    Bilirubin, UA negative    Ketones, UA negative    Spec Grav, UA 1.015    Blood, UA 3+    pH, UA 5.5    Protein, UA negative    Urobilinogen, UA 0.2    Nitrite, UA negative    Leukocytes, UA small (1+) (A) Negative  Vitamin B12     Status: None   Collection Time: 04/05/15 12:41 PM  Result Value Ref Range   Vitamin B-12 436 211 - 946 pg/mL  Comprehensive metabolic panel  Status: Abnormal   Collection Time: 04/05/15 12:41 PM  Result Value Ref Range   Glucose 100 (H) 65 - 99 mg/dL   BUN 14 6 - 24 mg/dL   Creatinine, Ser 0.69 0.57 - 1.00 mg/dL   GFR calc non Af Amer 102 >59 mL/min/1.73   GFR calc Af Amer 117 >59 mL/min/1.73   BUN/Creatinine Ratio 20 9 - 23   Sodium 146 (H) 134 - 144 mmol/L   Potassium 5.0 3.5 - 5.2 mmol/L   Chloride 104 97 - 108 mmol/L   CO2 23 18 - 29 mmol/L   Calcium 9.6 8.7 - 10.2 mg/dL   Total Protein 6.7 6.0 - 8.5 g/dL   Albumin 4.4 3.5 - 5.5 g/dL   Globulin, Total 2.3 1.5 - 4.5 g/dL   Albumin/Globulin Ratio 1.9 1.1 - 2.5   Bilirubin Total 0.8 0.0 - 1.2 mg/dL   Alkaline Phosphatase 57 39 - 117 IU/L   AST 17 0 - 40 IU/L   ALT 14 0 - 32 IU/L  CBC with Differential/Platelet     Status: None   Collection Time: 04/05/15 12:41 PM  Result Value Ref Range   WBC 8.5 3.4 - 10.8 x10E3/uL   RBC 4.52 3.77 - 5.28 x10E6/uL   Hemoglobin 13.2 11.1 - 15.9 g/dL   Hematocrit 40.3 34.0 - 46.6 %   MCV 89 79 - 97 fL   MCH 29.2 26.6 - 33.0 pg   MCHC 32.8 31.5 - 35.7 g/dL   RDW 13.4 12.3 - 15.4 %   Platelets 352 150 - 379 x10E3/uL   Neutrophils 56 %   Lymphs 34 %   Monocytes 6 %   Eos 3 %   Basos 1 %   Neutrophils Absolute 4.8 1.4 - 7.0 x10E3/uL   Lymphocytes Absolute 2.9 0.7 - 3.1 x10E3/uL   Monocytes Absolute 0.5 0.1 - 0.9 x10E3/uL   EOS (ABSOLUTE) 0.3 0.0 - 0.4  x10E3/uL   Basophils Absolute 0.0 0.0 - 0.2 x10E3/uL   Immature Granulocytes 0 %   Immature Grans (Abs) 0.0 0.0 - 0.1 x10E3/uL  TSH     Status: None   Collection Time: 04/05/15 12:41 PM  Result Value Ref Range   TSH 1.380 0.450 - 4.500 uIU/mL  Hgb A1c w/o eAG     Status: Abnormal   Collection Time: 04/05/15 12:41 PM  Result Value Ref Range   Hgb A1c MFr Bld 5.8 (H) 4.8 - 5.6 %    Comment:          Pre-diabetes: 5.7 - 6.4          Diabetes: >6.4          Glycemic control for adults with diabetes: <7.0   Specimen status report     Status: None   Collection Time: 04/05/15 12:41 PM  Result Value Ref Range   specimen status report Comment     Comment: Written Authorization Written Authorization Written Authorization Received. Authorization received from [employer] 04-10-2015 Logged by Guadalupe Maple   Urine culture     Status: None   Collection Time: 04/05/15 12:46 PM  Result Value Ref Range   Urine Culture, Routine Final report    Urine Culture result 1 Comment     Comment: Culture shows less than 10,000 colony forming units of bacteria per milliliter of urine. This colony count is not generally considered to be clinically significant.   Urinalysis, Complete     Status: Abnormal   Collection Time: 04/15/15  2:16 PM  Result Value Ref Range   Specific Gravity, UA >1.030 (H) 1.005 - 1.030   pH, UA 5.0 5.0 - 7.5   Color, UA Yellow Yellow   Appearance Ur Clear Clear   Leukocytes, UA Trace (A) Negative   Protein, UA Negative Negative/Trace   Glucose, UA Negative Negative   Ketones, UA 1+ (A) Negative   RBC, UA Negative Negative   Bilirubin, UA Negative Negative   Urobilinogen, Ur 0.2 0.2 - 1.0 mg/dL   Nitrite, UA Negative Negative   Microscopic Examination See below:   Microscopic Examination     Status: Abnormal   Collection Time: 04/15/15  2:16 PM  Result Value Ref Range   WBC, UA 0-5 0 -  5 /hpf   RBC, UA None seen 0 -  2 /hpf   Epithelial Cells (non renal) 0-10 0  - 10 /hpf   Renal Epithel, UA None seen None seen /hpf   Bacteria, UA Moderate (A) None seen/Few    PHQ2/9: Depression screen [facility] 2/9 04/29/2015 04/05/2015 12/28/2014  Decreased Interest 0 0 0  Down, Depressed, Hopeless 0 1 0  PHQ - 2 Score 0 1 0     Fall Risk: Fall Risk  04/29/2015 04/05/2015 12/28/2014  Falls in the past year? Yes Yes Yes  Number falls in past yr: 1 1 1   Injury with Fall? No No Yes  Follow up - - Falls evaluation completed     Assessment & Plan  1. Abnormal perimenopausal bleeding  - Ambulatory referral to Obstetrics / Gynecology  2. Hot flashes  - cloNIDine (CATAPRES) 0.1 MG tablet; Take 1 tablet (0.1 mg total) by mouth at bedtime as needed.  Dispense: 30 tablet; Refill: 2  3. Right lumbar radiculitis  After MRI, we may need to see her or refer her back to neurosurgeon   4. Obesity (BMI 30-39.9)  Doing well, advised not to eat right before bed

## 2015-05-14 ENCOUNTER — Other Ambulatory Visit: Payer: Self-pay | Admitting: Obstetrics and Gynecology

## 2015-05-14 NOTE — Telephone Encounter (Signed)
It looks like the order is already in.

## 2015-05-15 ENCOUNTER — Ambulatory Visit
Admission: RE | Admit: 2015-05-15 | Discharge: 2015-05-15 | Disposition: A | Discharge status: Home / Self Care | Payer: 59 | Source: Ambulatory Visit | Attending: Obstetrics and Gynecology | Admitting: Obstetrics and Gynecology

## 2015-05-15 DIAGNOSIS — N289 Disorder of kidney and ureter, unspecified: Secondary | ICD-10-CM | POA: Insufficient documentation

## 2015-05-15 MED ORDER — IOHEXOL 350 MG/ML SOLN
100.0000 mL | Freq: Once | INTRAVENOUS | Status: AC | PRN
  Administered 2015-05-15: 100 mL via INTRAVENOUS

## 2015-05-16 ENCOUNTER — Ambulatory Visit (INDEPENDENT_AMBULATORY_CARE_PROVIDER_SITE_OTHER): Payer: 59 | Admitting: Obstetrics and Gynecology

## 2015-05-16 ENCOUNTER — Encounter: Payer: Self-pay | Admitting: Obstetrics and Gynecology

## 2015-05-16 VITALS — BP 111/71 | HR 71 | Resp 16 | Ht 65.0 in

## 2015-05-16 DIAGNOSIS — N289 Disorder of kidney and ureter, unspecified: Secondary | ICD-10-CM

## 2015-05-16 DIAGNOSIS — N926 Irregular menstruation, unspecified: Secondary | ICD-10-CM | POA: Diagnosis not present

## 2015-05-16 LAB — URINALYSIS, COMPLETE
Bilirubin, UA: NEGATIVE
GLUCOSE, UA: NEGATIVE
Ketones, UA: NEGATIVE
NITRITE UA: NEGATIVE
PH UA: 5.5 (ref 5.0–7.5)
PROTEIN UA: NEGATIVE
Specific Gravity, UA: 1.025 (ref 1.005–1.030)
Urobilinogen, Ur: 0.2 mg/dL (ref 0.2–1.0)

## 2015-05-16 LAB — MICROSCOPIC EXAMINATION: Epithelial Cells (non renal): >10 /hpf — ABNORMAL HIGH (ref 0–10)

## 2015-05-16 NOTE — Progress Notes (Signed)
05/16/2015 10:35 AM   Kelly [name] 09-04-1965 [id number]  Referring provider: Steele Sizer, MD [address] [place], [place] [address]  Chief Complaint  Patient presents with  . Renal lesion  . Results    CT    HPI: Patient is a 49 year old female with a history renal stones presenting today to review recent abdominal CT results ordered for further evaluation of renal lesions seen on CT renal stone study.  Further evaluation with contrast dye showed bilateral renal sinus cysts Bosniak 1.   Patient denies any pain or urinary symptoms.  She does report that is having her period every 2 weeks and finished menstruating yesterday. Which could account for microscopic hematuria seen on UA today.  PMH: Past Medical History  Diagnosis Date  . GERD (gastroesophageal reflux disease)   . Depression   . Migraines   . Insomnia   . Osteoarthritis   . Symptomatic menopausal or female climacteric states   . Recurrent UTI   . Sciatica of right side   . Vitamin D deficiency   . Intermittent low back pain   . Allergy   . Restless leg syndrome     Surgical History: Past Surgical History  Procedure Laterality Date  . Tonsillectomy and adenoidectomy    . Urethral stricture dilatation    . Knee surgery Left 05/2011    Dr. Rudene Christians- Arthroscopic    Home Medications:    Medication List       This list is accurate as of: 05/16/15 10:35 AM.  Always use your most recent med list.               ALPRAZolam 0.5 MG tablet  Commonly known as:  XANAX  Take 1 tablet by mouth 2 (two) times daily as needed.     cloNIDine 0.1 MG tablet  Commonly known as:  CATAPRES  Take 1 tablet (0.1 mg total) by mouth at bedtime as needed.     cyclobenzaprine 10 MG tablet  Commonly known as:  FLEXERIL  Take 1 tablet (10 mg total) by mouth 3 (three) times daily as needed for muscle spasms.     gabapentin 300 MG capsule  Commonly known as:  NEURONTIN  TAKE ONE CAPSULE BY MOUTH THREE  TIMES DAILY     IMITREX 50 MG tablet  Generic drug:  SUMAtriptan  Take 1 tablet by mouth as needed.     loratadine 10 MG tablet  Commonly known as:  CLARITIN  TAKE ONE TABLET BY MOUTH ONE TIME DAILY     Medical Compression Stockings Misc  2 Units by Does not apply route daily.     meloxicam 15 MG tablet  Commonly known as:  MOBIC  Take 1 tablet (15 mg total) by mouth daily.     omeprazole 40 MG capsule  Commonly known as:  PRILOSEC  TAKE ONE CAPSULE BY MOUTH EVERY MORNING     pramipexole 1.5 MG tablet  Commonly known as:  MIRAPEX  TAKE ONE TABLET BY MOUTH DAILY IN THE EVENING FOR RLS     promethazine 12.5 MG tablet  Commonly known as:  PHENERGAN  Take 1 tablet (12.5 mg total) by mouth every 8 (eight) hours as needed for nausea or vomiting.     traMADol 50 MG tablet  Commonly known as:  ULTRAM  Take 1 tablet (50 mg total) by mouth 2 (two) times daily as needed.     traZODone 50 MG tablet  Commonly known as:  DESYREL  Take 1 tablet by mouth as needed.        Allergies:  Allergies  Allergen Reactions  . Cephalexin   . Ciprofloxacin Hives  . Nitrofurantoin Monohyd Macro   . Penicillins   . Sulfa Antibiotics     Family History: Family History  Problem Relation Age of Onset  . Cancer Paternal Grandmother     Social History:  reports that she quit smoking about 31 years ago. Her smoking use included Cigarettes. She started smoking about 33 years ago. She has a 1.5 pack-year smoking history. She has never used smokeless tobacco. She reports that she does not drink alcohol or use illicit drugs.  ROS: UROLOGY Frequent Urination?: No Hard to postpone urination?: No Burning/pain with urination?: No Get up at night to urinate?: No Leakage of urine?: No Urine stream starts and stops?: No Trouble starting stream?: No Do you have to strain to urinate?: No Blood in urine?: No Urinary tract infection?: No Sexually transmitted disease?: No Injury to kidneys or  bladder?: No Painful intercourse?: No Weak stream?: No Currently pregnant?: No Vaginal bleeding?: No Last menstrual period?: 05/08/2015  Gastrointestinal Nausea?: Yes Vomiting?: No Indigestion/heartburn?: Yes Diarrhea?: No Constipation?: No  Constitutional Fever: No Night sweats?: Yes Weight loss?: No Fatigue?: No  Skin Skin rash/lesions?: No Itching?: No  Eyes Blurred vision?: No Double vision?: No  Ears/Nose/Throat Sore throat?: No Sinus problems?: No  Hematologic/Lymphatic Swollen glands?: No Easy bruising?: No  Cardiovascular Leg swelling?: Yes Chest pain?: No  Respiratory Cough?: No Shortness of breath?: No  Endocrine Excessive thirst?: No  Musculoskeletal Back pain?: Yes Joint pain?: Yes  Neurological Headaches?: No Dizziness?: No  Psychologic Depression?: No Anxiety?: No  Physical Exam: BP 111/71 mmHg  Pulse 71  Resp 16  Ht 5\' 5"  (1.651 m)  LMP 05/08/2015  Constitutional:  Alert and oriented, No acute distress. HEENT: [place] AT, moist mucus membranes.  Trachea midline, no masses. Cardiovascular: No clubbing, cyanosis, or edema. Respiratory: Normal respiratory effort, no increased work of breathing. Skin: No rashes, bruises or suspicious lesions. Lymph: No cervical or inguinal adenopathy. Neurologic: Grossly intact, no focal deficits, moving all 4 extremities. Psychiatric: Normal mood and affect.  Laboratory Data:   Urinalysis    Component Value Date/Time   GLUCOSEU Negative 04/15/2015 1416   BILIRUBINUR Negative 04/15/2015 1416   BILIRUBINUR negative 04/05/2015 1238   PROTEINUR negative 04/05/2015 1238   UROBILINOGEN 0.2 04/05/2015 1238   NITRITE Negative 04/15/2015 1416   NITRITE negative 04/05/2015 1238   LEUKOCYTESUR Trace* 04/15/2015 1416   LEUKOCYTESUR small (1+)* 04/05/2015 1238    Pertinent Imaging:   Assessment & Plan:    1. Renal Cysts- Bilateral renal sinus cysts, benign (Bosniak I).  No further follow up  indicated. - Urinalysis, Complete  2. Microscopic hematuria- I suspect patient's microscopic hematuria on today's UA is due to her recent completion of her menstrual cycle greater than 10 epithelial cells were also seen with WBCs and many bacteria I suspect this is related to vaginal contamination. She denies any urinary symptoms including dysuria or gross hematuria. I will have her follow-up in 6 months for a recheck.  3. Irregular menses-  Patient reports shortened cycle with menses occuring every 2 weeks. She has a referral to [facility] pending.  Return in about 6 months (around 11/13/2015) for recheck possible microhematuria.  These notes generated with voice recognition software. I apologize for typographical errors.  Herbert Moors, [facility] [address] [place], [place] [address] [phone]  [phone]   

## 2015-05-22 ENCOUNTER — Ambulatory Visit
Admission: RE | Admit: 2015-05-22 | Discharge: 2015-05-22 | Disposition: A | Discharge status: Home / Self Care | Payer: 59 | Source: Ambulatory Visit | Attending: Family Medicine | Admitting: Family Medicine

## 2015-05-22 DIAGNOSIS — M5417 Radiculopathy, lumbosacral region: Secondary | ICD-10-CM | POA: Insufficient documentation

## 2015-05-22 DIAGNOSIS — M5416 Radiculopathy, lumbar region: Secondary | ICD-10-CM

## 2015-06-10 ENCOUNTER — Encounter: Payer: Self-pay | Admitting: Family Medicine

## 2015-06-10 ENCOUNTER — Ambulatory Visit (INDEPENDENT_AMBULATORY_CARE_PROVIDER_SITE_OTHER): Payer: 59 | Admitting: Family Medicine

## 2015-06-10 ENCOUNTER — Encounter: Payer: 59 | Admitting: Certified Nurse Midwife

## 2015-06-10 VITALS — BP 110/74 | HR 98 | Temp 98.0°F | Resp 16 | Wt 207.0 lb

## 2015-06-10 DIAGNOSIS — A084 Viral intestinal infection, unspecified: Secondary | ICD-10-CM | POA: Insufficient documentation

## 2015-06-10 NOTE — Progress Notes (Signed)
Name: Kelly [name]   MRN: [id number]    DOB: 14-Sep-1965   Date:06/10/2015       Progress Note  Subjective  Chief Complaint  Chief Complaint  Patient presents with  . GI Problem    nausea for about week, diarrhea since friday night. try to work but was sent home. chills & loss of appetite.    HPI  Kelly [name] is a 49 year old female who is here today to report last week onset of fatigue and hot flashes starting Tuesday 06/04/15 followed by generalized abdominal pain, nausea without vomiting, diarrhea starting Friday 06/07/15. She was unable to go to work Saturday or Sunday and she rested at home and took care of her symptoms with conservative management. Off today. Reports symptoms resolved now. Needs work note.  Past Medical History  Diagnosis Date  . GERD (gastroesophageal reflux disease)   . Depression   . Migraines   . Insomnia   . Osteoarthritis   . Symptomatic menopausal or female climacteric states   . Recurrent UTI   . Sciatica of right side   . Vitamin D deficiency   . Intermittent low back pain   . Allergy   . Restless leg syndrome     Patient Active Problem List   Diagnosis Date Noted  . Viral gastroenteritis 06/10/2015  . Hematuria 04/09/2015  . Hyperglycemia 04/06/2015  . Right lumbar radiculitis 12/28/2014  . Anxiety and depression 12/27/2014  . Insomnia, persistent 12/27/2014  . Gastro-esophageal reflux disease without esophagitis 12/27/2014  . Bulge of lumbar disc without myelopathy 12/27/2014  . Dysmetabolic syndrome [id number]  . Migraine without aura and responsive to treatment 12/27/2014  . Osteoarthrosis 12/27/2014  . Obesity (BMI 30-39.9) 12/27/2014  . Restless leg 12/27/2014  . Allergic rhinitis, seasonal 12/27/2014    Social History  Substance Use Topics  . Smoking status: Former Smoker -- 0.75 packs/day for 2 years    Types: Cigarettes    Start date: 07/06/1981    Quit date: 07/07/1983  . Smokeless tobacco: Never Used  .  Alcohol Use: No     Current outpatient prescriptions:  .  ALPRAZolam (XANAX) 0.5 MG tablet, Take 1 tablet by mouth 2 (two) times daily as needed., Disp: , Rfl:  .  cloNIDine (CATAPRES) 0.1 MG tablet, Take 1 tablet (0.1 mg total) by mouth at bedtime as needed., Disp: 30 tablet, Rfl: 2 .  Elastic Bandages & Supports (MEDICAL COMPRESSION STOCKINGS) MISC, 2 Units by Does not apply route daily., Disp: 1 each, Rfl: 2 .  loratadine (CLARITIN) 10 MG tablet, TAKE ONE TABLET BY MOUTH ONE TIME DAILY, Disp: 30 tablet, Rfl: 0 .  omeprazole (PRILOSEC) 40 MG capsule, TAKE ONE CAPSULE BY MOUTH EVERY MORNING, Disp: 30 capsule, Rfl: 5 .  pramipexole (MIRAPEX) 1.5 MG tablet, TAKE ONE TABLET BY MOUTH DAILY IN THE EVENING FOR RLS, Disp: 30 tablet, Rfl: 2 .  promethazine (PHENERGAN) 12.5 MG tablet, Take 1 tablet (12.5 mg total) by mouth every 8 (eight) hours as needed for nausea or vomiting., Disp: 12 tablet, Rfl: 0 .  SUMAtriptan (IMITREX) 50 MG tablet, Take 1 tablet by mouth as needed., Disp: , Rfl:  .  traMADol (ULTRAM) 50 MG tablet, Take 1 tablet (50 mg total) by mouth 2 (two) times daily as needed., Disp: 90 tablet, Rfl: 0 .  traZODone (DESYREL) 50 MG tablet, Take 1 tablet by mouth as needed., Disp: , Rfl:  .  cyclobenzaprine (FLEXERIL) 10 MG tablet, Take 1 tablet (10 mg  total) by mouth 3 (three) times daily as needed for muscle spasms. (Patient not taking: Reported on 06/10/2015), Disp: 30 tablet, Rfl: 0 .  gabapentin (NEURONTIN) 300 MG capsule, TAKE ONE CAPSULE BY MOUTH THREE TIMES DAILY (Patient not taking: Reported on 06/10/2015), Disp: 90 capsule, Rfl: 1  Allergies  Allergen Reactions  . Cephalexin   . Ciprofloxacin Hives  . Nitrofurantoin Monohyd Macro   . Penicillins   . Sulfa Antibiotics     Review of Systems  Positive for recent diarrhea and nausea as mentioned in HPI, otherwise all systems reviewed and are negative.  Objective  BP 110/74 mmHg  Pulse 98  Temp(Src) 98 F (36.7 C) (Oral)   Resp 16  Wt 207 lb (93.895 kg)  SpO2 97%  LMP 06/01/2015 (Exact Date)  Body mass index is 34.45 kg/(m^2).   Physical Exam  Constitutional: Patient appears well-developed and well-nourished. In no distress.  Cardiovascular: Normal rate, regular rhythm and normal heart sounds.  No murmur heard.  Pulmonary/Chest: Effort normal and breath sounds normal. No respiratory distress. Abdomen: Soft, non tender, non distended, normal bowel sounds, no HSM. Psychiatric: Patient has a normal mood and affect. Behavior is normal in office today. Judgment and thought content normal in office today.    Assessment & Plan  1. Viral gastroenteritis Resolved. Cleared to return to work. Continue hand higene and routine precautions.

## 2015-08-11 ENCOUNTER — Other Ambulatory Visit: Payer: Self-pay | Admitting: Family Medicine

## 2015-08-17 ENCOUNTER — Other Ambulatory Visit: Payer: Self-pay | Admitting: Family Medicine

## 2015-09-02 ENCOUNTER — Ambulatory Visit (INDEPENDENT_AMBULATORY_CARE_PROVIDER_SITE_OTHER): Payer: 59 | Admitting: Family Medicine

## 2015-09-02 ENCOUNTER — Encounter: Payer: Self-pay | Admitting: Family Medicine

## 2015-09-02 VITALS — BP 112/72 | HR 76 | Temp 98.6°F | Resp 16 | Ht 65.0 in | Wt 206.6 lb

## 2015-09-02 DIAGNOSIS — M79672 Pain in left foot: Secondary | ICD-10-CM

## 2015-09-02 DIAGNOSIS — G43009 Migraine without aura, not intractable, without status migrainosus: Secondary | ICD-10-CM | POA: Diagnosis not present

## 2015-09-02 DIAGNOSIS — E8881 Metabolic syndrome: Secondary | ICD-10-CM | POA: Diagnosis not present

## 2015-09-02 DIAGNOSIS — R0683 Snoring: Secondary | ICD-10-CM | POA: Diagnosis not present

## 2015-09-02 DIAGNOSIS — J302 Other seasonal allergic rhinitis: Secondary | ICD-10-CM | POA: Diagnosis not present

## 2015-09-02 DIAGNOSIS — M5417 Radiculopathy, lumbosacral region: Secondary | ICD-10-CM | POA: Diagnosis not present

## 2015-09-02 DIAGNOSIS — M5416 Radiculopathy, lumbar region: Secondary | ICD-10-CM

## 2015-09-02 DIAGNOSIS — F418 Other specified anxiety disorders: Secondary | ICD-10-CM | POA: Diagnosis not present

## 2015-09-02 DIAGNOSIS — Z1239 Encounter for other screening for malignant neoplasm of breast: Secondary | ICD-10-CM | POA: Diagnosis not present

## 2015-09-02 DIAGNOSIS — M79671 Pain in right foot: Secondary | ICD-10-CM | POA: Diagnosis not present

## 2015-09-02 DIAGNOSIS — F329 Major depressive disorder, single episode, unspecified: Secondary | ICD-10-CM

## 2015-09-02 DIAGNOSIS — G2581 Restless legs syndrome: Secondary | ICD-10-CM

## 2015-09-02 DIAGNOSIS — F419 Anxiety disorder, unspecified: Secondary | ICD-10-CM

## 2015-09-02 DIAGNOSIS — F32A Depression, unspecified: Secondary | ICD-10-CM

## 2015-09-02 DIAGNOSIS — G4700 Insomnia, unspecified: Secondary | ICD-10-CM

## 2015-09-02 MED ORDER — PRAMIPEXOLE DIHYDROCHLORIDE 1.5 MG PO TABS: 1.5000 mg | ORAL_TABLET | Freq: Every day | ORAL | Status: DC

## 2015-09-02 MED ORDER — ALPRAZOLAM 0.5 MG PO TABS: 0.5000 mg | ORAL_TABLET | Freq: Every evening | ORAL | Status: DC | PRN

## 2015-09-02 MED ORDER — MELOXICAM 15 MG PO TABS: 15.0000 mg | ORAL_TABLET | Freq: Every day | ORAL | Status: DC

## 2015-09-02 MED ORDER — LORATADINE 10 MG PO TABS: 10.0000 mg | ORAL_TABLET | Freq: Two times a day (BID) | ORAL | Status: DC

## 2015-09-02 NOTE — Progress Notes (Signed)
Name: Kelly [name]   MRN: [id number]    DOB: 1965/09/10   Date:09/02/2015       Progress Note  Subjective  Chief Complaint  Chief Complaint  Patient presents with  . Foot Pain    patient stated that the balls of her feet really hurt. hard to stand and walk. patient has tried arch supports, gel inserts and different types of shoes.  . Leg Swelling    even while wearing TED hose she has swelling  . Leg Problem    restless leg syndrome has increased, especially when she has a stressful day.  . 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 Allergic Rhinitis     itchy & watery eyes. had eye exam last week.  . 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 She has been working full time for the past month. She works 8 hours day, she states pain is okay while at work. She states at the end of the day her pain is severe - on the ball of her feet. She has tried different shoes, insoles without help. She also has noticed some lower leg swelling - she has tried compression stocking hoses but it seems to make symptoms worse. Swelling improves when she raises her legs. She does not have varicose veins.  RLS: she stopped taking Gabapentin because her radiculitis has improved, but she has noticed worsening of RLS. Advised to resume Gabapentin before bed time  Anxiety/Depression: she has a little emotional before her cycles, but is happy at work, no sadness. She states she still has anxiety and would like to take Xanax prn only. She does not need medication daily   Migraine Headaches: episodes are very seldom now, once every few months. Usually triggered when very tired. She states she no longer has insomnia and is doing  better.   AR: still using nasal steroid. She has noticed some rhinorrhea, sneezing, and watery and itchy eyes. She is taking Loratadine, advised to take twice daily .  Snoring: she states she wakes up feeling tired at times, snores loudly and at times she wakes up gasping at night.    Patient Active Problem List   Diagnosis Date Noted  . Hematuria 04/09/2015  . Hyperglycemia 04/06/2015  . Right lumbar radiculitis 12/28/2014  . Anxiety and depression 12/27/2014  . Gastro-esophageal reflux disease without esophagitis 12/27/2014  . Bulge of lumbar disc without myelopathy 12/27/2014  . Dysmetabolic syndrome [id number]  . Migraine without aura and responsive to treatment 12/27/2014  . Osteoarthrosis 12/27/2014  . Obesity (BMI 30-39.9) 12/27/2014  . Restless leg 12/27/2014  . Allergic rhinitis, seasonal 12/27/2014    Past Surgical History  Procedure Laterality Date  . Tonsillectomy and adenoidectomy    . Urethral stricture dilatation    . Knee surgery Left 05/2011    Dr. Rudene Christians- Arthroscopic    Family History  Problem Relation Age of Onset  . Cancer Paternal Grandmother     Social History   Social History  . Marital Status: Married    Spouse Name: N/A  . Number of Children: N/A  . Years of Education: N/A   Occupational History  .  Not on file.   Social History Main Topics  . Smoking status: Former Smoker -- 0.75 packs/day for 2 years    Types: Cigarettes    Start date: 07/06/1981    Quit date: 07/07/1983  . Smokeless tobacco: Never Used  . Alcohol Use: No  . Drug Use: No  . Sexual Activity:    Partners: Male   Other Topics Concern  . Not on file   Social History Narrative     Current outpatient prescriptions:  .  ALPRAZolam (XANAX) 0.5 MG tablet, Take 1 tablet (0.5 mg total) by mouth at bedtime as needed., Disp: 30 tablet, Rfl: 0 .  cloNIDine (CATAPRES) 0.1 MG tablet, TAKE 1 TABLET (0.1 MG TOTAL) BY MOUTH AT BEDTIME AS NEEDED., Disp: 30 tablet, Rfl: 0 .   Elastic Bandages & Supports (MEDICAL COMPRESSION STOCKINGS) MISC, 2 Units by Does not apply route daily., Disp: 1 each, Rfl: 2 .  gabapentin (NEURONTIN) 300 MG capsule, TAKE ONE CAPSULE BY MOUTH THREE TIMES DAILY, Disp: 90 capsule, Rfl: 1 .  loratadine (CLARITIN) 10 MG tablet, TAKE ONE TABLET BY MOUTH ONE TIME DAILY, Disp: 30 tablet, Rfl: 0 .  meloxicam (MOBIC) 15 MG tablet, Take 1 tablet (15 mg total) by mouth daily., Disp: 30 tablet, Rfl: 2 .  omeprazole (PRILOSEC) 40 MG capsule, TAKE ONE CAPSULE BY MOUTH EVERY MORNING, Disp: 30 capsule, Rfl: 5 .  pramipexole (MIRAPEX) 1.5 MG tablet, Take 1 tablet (1.5 mg total) by mouth at bedtime., Disp: 30 tablet, Rfl: 2 .  promethazine (PHENERGAN) 12.5 MG tablet, Take 1 tablet (12.5 mg total) by mouth every 8 (eight) hours as needed for nausea or vomiting., Disp: 12 tablet, Rfl: 0 .  SUMAtriptan (IMITREX) 50 MG tablet, Take 1 tablet by mouth as needed., Disp: , Rfl:  .  traMADol (ULTRAM) 50 MG tablet, Take 1 tablet (50 mg total) by mouth 2 (two) times daily as needed., Disp: 90 tablet, Rfl: 0  Allergies  Allergen Reactions  . Cephalexin   . Ciprofloxacin Hives  . Nitrofurantoin Monohyd Macro   . Penicillins   . Sulfa Antibiotics      ROS  Constitutional: Negative for fever or weight change.  Respiratory: Negative for cough and shortness of breath.   Cardiovascular: Negative for chest pain or palpitations.  Gastrointestinal: Negative for abdominal pain, no bowel changes.  Musculoskeletal: Negative for gait problem or joint swelling.  Skin: Negative for rash.  Neurological: Negative for dizziness or headache.  No other specific complaints in a complete review of systems (except as listed in HPI above).  Objective  Filed Vitals:   09/02/15 0842  BP: 112/72  Pulse: 76  Temp: 98.6 F (37 C)  TempSrc: Oral  Resp: 16  Height: 5\' 5"  (1.651 m)  Weight: 206 lb 9.6 oz (93.713 kg)  SpO2: 96%    Body mass index is 34.38 kg/(m^2).  Physical  Exam  Constitutional: Patient appears well-developed and well-nourished. Obese  No distress.  HEENT: head atraumatic, normocephalic, pupils equal and reactive to light, neck supple, throat within normal limits Cardiovascular: Normal rate, regular rhythm and normal heart sounds.  No murmur heard. Trace  BLE edema. Pulmonary/Chest: Effort normal and breath sounds normal. No respiratory distress. Abdominal: Soft.  There is no tenderness. Psychiatric: Patient has a normal mood and affect. behavior is normal. Judgment and thought content normal.   PHQ2/9: Depression screen [facility] 2/9 09/02/2015 04/29/2015 04/05/2015 12/28/2014  Decreased Interest 0 0 0 0  Down, Depressed, Hopeless 0 0 1  0  PHQ - 2 Score 0 0 1 0     Fall Risk: Fall Risk  09/02/2015 04/29/2015 04/05/2015 12/28/2014  Falls in the past year? No Yes Yes Yes  Number falls in past yr: - 1 1 1   Injury with Fall? - No No Yes  Follow up - - - Falls evaluation completed     Functional Status Survey: Is the patient deaf or have difficulty hearing?: No Does the patient have difficulty seeing, even when wearing glasses/contacts?: No Does the patient have difficulty concentrating, remembering, or making decisions?: No Does the patient have difficulty walking or climbing stairs?: Yes (due to bilateral foot/ leg pain) Does the patient have difficulty dressing or bathing?: No Does the patient have difficulty doing errands alone such as visiting a doctor's office or shopping?: No    Assessment & Plan  1. Dysmetabolic syndrome  Discussed life style modification   2. Insomnia, persistent  Resolved  3. Anxiety and depression  - ALPRAZolam (XANAX) 0.5 MG tablet; Take 1 tablet (0.5 mg total) by mouth at bedtime as needed.  Dispense: 30 tablet; Refill: 0  4. Restless leg  - pramipexole (MIRAPEX) 1.5 MG tablet; Take 1 tablet (1.5 mg total) by mouth at bedtime.  Dispense: 30 tablet; Refill: 2  5. Pain in both feet  - Ambulatory  referral to Podiatry  6. Right lumbar radiculitis  - meloxicam (MOBIC) 15 MG tablet; Take 1 tablet (15 mg total) by mouth daily.  Dispense: 30 tablet; Refill: 2  7. Migraine without aura and responsive to treatment  Doing well on medication prn  8. Breast cancer screening  - MM Digital Screening; Future  9. Allergic rhinitis, seasonal  Increase Loratadine to twice daily   10. Snoring  - Ambulatory referral to Sleep Studies

## 2015-09-03 ENCOUNTER — Encounter: Payer: Self-pay | Admitting: Family Medicine

## 2015-09-22 ENCOUNTER — Other Ambulatory Visit: Payer: Self-pay | Admitting: Family Medicine

## 2015-09-25 ENCOUNTER — Ambulatory Visit (INDEPENDENT_AMBULATORY_CARE_PROVIDER_SITE_OTHER): Payer: 59 | Admitting: Family Medicine

## 2015-09-25 ENCOUNTER — Encounter: Payer: Self-pay | Admitting: Family Medicine

## 2015-09-25 VITALS — BP 116/68 | HR 79 | Temp 98.0°F | Resp 18 | Ht 65.0 in | Wt 206.7 lb

## 2015-09-25 DIAGNOSIS — K047 Periapical abscess without sinus: Secondary | ICD-10-CM

## 2015-09-25 DIAGNOSIS — B379 Candidiasis, unspecified: Secondary | ICD-10-CM | POA: Diagnosis not present

## 2015-09-25 DIAGNOSIS — T3695XA Adverse effect of unspecified systemic antibiotic, initial encounter: Secondary | ICD-10-CM

## 2015-09-25 MED ORDER — FLUCONAZOLE 150 MG PO TABS: 150.0000 mg | ORAL_TABLET | ORAL | Status: DC

## 2015-09-25 MED ORDER — CLINDAMYCIN HCL 300 MG PO CAPS: 300.0000 mg | ORAL_CAPSULE | Freq: Three times a day (TID) | ORAL | Status: DC

## 2015-09-25 NOTE — Progress Notes (Signed)
Name: Kelly [name]   MRN: [id number]    DOB: 01/10/1966   Date:09/25/2015       Progress Note  Subjective  Chief Complaint  Chief Complaint  Patient presents with  . Jaw Pain    Onset-this weekend on right side and radiating up face. Pain has been excruciating and gave her pain medication but unable to take when working. But did not give her antibiotic.    HPI  Tooth Abscess: she developed pain on right side of face and some swelling 6 days ago, she went to her dentist and was diagnosed with a tooth abscess, she was given pain medication and will see an oral surgeon in 4 days, but she is tired of the pain and would like antibiotic. She is allergic to multiple antibiotics. We will try clindamycin. She is feeling tired, unable to chew because of the pain, having only soft food and has lost some weight. She has had a low grade fever at home also.    Patient Active Problem List   Diagnosis Date Noted  . Hematuria 04/09/2015  . Hyperglycemia 04/06/2015  . Right lumbar radiculitis 12/28/2014  . Anxiety and depression 12/27/2014  . Gastro-esophageal reflux disease without esophagitis 12/27/2014  . Bulge of lumbar disc without myelopathy 12/27/2014  . Dysmetabolic syndrome [id number]  . Migraine without aura and responsive to treatment 12/27/2014  . Osteoarthrosis 12/27/2014  . Obesity (BMI 30-39.9) 12/27/2014  . Restless leg 12/27/2014  . Allergic rhinitis, seasonal 12/27/2014    Past Surgical History  Procedure Laterality Date  . Tonsillectomy and adenoidectomy    . Urethral stricture dilatation    . Knee surgery Left 05/2011    Dr. Rudene Christians- Arthroscopic    Family History  Problem Relation Age of Onset  . Cancer Paternal Grandmother     Social History   Social History  . Marital Status: Married    Spouse Name: N/A  . Number of Children: N/A  . Years of Education: N/A   Occupational History  . Not on file.   Social History Main Topics  . Smoking status: Former  Smoker -- 0.75 packs/day for 2 years    Types: Cigarettes    Start date: 07/06/1981    Quit date: 07/07/1983  . Smokeless tobacco: Never Used  . Alcohol Use: No  . Drug Use: No  . Sexual Activity:    Partners: Male   Other Topics Concern  . Not on file   Social History Narrative     Current outpatient prescriptions:  .  ALPRAZolam (XANAX) 0.5 MG tablet, Take 1 tablet (0.5 mg total) by mouth at bedtime as needed., Disp: 30 tablet, Rfl: 0 .  cloNIDine (CATAPRES) 0.1 MG tablet, TAKE 1 TABLET (0.1 MG TOTAL) BY MOUTH AT BEDTIME AS NEEDED., Disp: 30 tablet, Rfl: 0 .  Elastic Bandages & Supports (MEDICAL COMPRESSION STOCKINGS) MISC, 2 Units by Does not apply route daily., Disp: 1 each, Rfl: 2 .  gabapentin (NEURONTIN) 300 MG capsule, TAKE ONE CAPSULE BY MOUTH THREE TIMES DAILY, Disp: 90 capsule, Rfl: 1 .  Hydrocodone-Acetaminophen 5-300 MG TABS, Take 1 tablet by mouth every 4 (four) hours as needed (Pain)., Disp: , Rfl:  .  loratadine (CLARITIN) 10 MG tablet, Take 1 tablet (10 mg total) by mouth 2 (two) times daily., Disp: 60 tablet, Rfl: 5 .  meloxicam (MOBIC) 15 MG tablet, TAKE 1 TABLET (15 MG TOTAL) BY MOUTH DAILY., Disp: 30 tablet, Rfl: 1 .  omeprazole (PRILOSEC) 40 MG capsule, TAKE  ONE CAPSULE BY MOUTH EVERY MORNING, Disp: 30 capsule, Rfl: 5 .  pramipexole (MIRAPEX) 1.5 MG tablet, Take 1 tablet (1.5 mg total) by mouth at bedtime., Disp: 30 tablet, Rfl: 2 .  promethazine (PHENERGAN) 12.5 MG tablet, Take 1 tablet (12.5 mg total) by mouth every 8 (eight) hours as needed for nausea or vomiting., Disp: 12 tablet, Rfl: 0 .  SUMAtriptan (IMITREX) 50 MG tablet, Take 1 tablet by mouth as needed., Disp: , Rfl:  .  traMADol (ULTRAM) 50 MG tablet, Take 1 tablet (50 mg total) by mouth 2 (two) times daily as needed., Disp: 90 tablet, Rfl: 0 .  clindamycin (CLEOCIN) 300 MG capsule, Take 1 capsule (300 mg total) by mouth 3 (three) times daily., Disp: 30 capsule, Rfl: 0 .  fluconazole (DIFLUCAN) 150 MG  tablet, Take 1 tablet (150 mg total) by mouth every other day., Disp: 3 tablet, Rfl: 0  Allergies  Allergen Reactions  . Cephalexin   . Ciprofloxacin Hives  . Erythromycin   . Nitrofurantoin Monohyd Macro   . Penicillins   . Sulfa Antibiotics      ROS  Ten systems reviewed and is negative except as mentioned in HPI   Objective  Filed Vitals:   09/25/15 0844  BP: 116/68  Pulse: 79  Temp: 98 F (36.7 C)  TempSrc: Oral  Resp: 18  Height: 5\' 5"  (1.651 m)  Weight: 206 lb 11.2 oz (93.759 kg)  SpO2: 98%    Body mass index is 34.4 kg/(m^2).  Physical Exam  Constitutional: Patient appears well-developed and well-nourished. Obese  No distress.  HEENT: head atraumatic, normocephalic, pupils equal and reactive to light, tender right maxillary sinus, broken 2nd molar right lower - tender during palpation around the tooth, neck supple, throat within normal limits Cardiovascular: Normal rate, regular rhythm and normal heart sounds.  No murmur heard. No BLE edema. Pulmonary/Chest: Effort normal and breath sounds normal. No respiratory distress. Abdominal: Soft.  There is no tenderness. Psychiatric: Patient has a normal mood and affect. behavior is normal. Judgment and thought content normal.  PHQ2/9: Depression screen [facility] 2/9 09/02/2015 04/29/2015 04/05/2015 12/28/2014  Decreased Interest 0 0 0 0  Down, Depressed, Hopeless 0 0 1 0  PHQ - 2 Score 0 0 1 0    Fall Risk: Fall Risk  09/02/2015 04/29/2015 04/05/2015 12/28/2014  Falls in the past year? No Yes Yes Yes  Number falls in past yr: - 1 1 1   Injury with Fall? - No No Yes  Follow up - - - Falls evaluation completed   Assessment & Plan  1. Tooth abscess  Mild swelling around a broken tooth on the right lower 2nd molar , we will try clindamycin - clindamycin (CLEOCIN) 300 MG capsule; Take 1 capsule (300 mg total) by mouth 3 (three) times daily.  Dispense: 30 capsule; Refill: 0  2. Antibiotic-induced yeast infection  -  fluconazole (DIFLUCAN) 150 MG tablet; Take 1 tablet (150 mg total) by mouth every other day.  Dispense: 3 tablet; Refill: 0

## 2015-09-26 ENCOUNTER — Encounter: Payer: Self-pay | Admitting: Family Medicine

## 2015-09-26 ENCOUNTER — Ambulatory Visit (INDEPENDENT_AMBULATORY_CARE_PROVIDER_SITE_OTHER): Payer: 59 | Admitting: Family Medicine

## 2015-09-26 VITALS — BP 106/58 | HR 84 | Temp 98.5°F | Resp 12 | Wt 206.0 lb

## 2015-09-26 DIAGNOSIS — K047 Periapical abscess without sinus: Secondary | ICD-10-CM | POA: Diagnosis not present

## 2015-09-26 DIAGNOSIS — Z889 Allergy status to unspecified drugs, medicaments and biological substances status: Secondary | ICD-10-CM | POA: Diagnosis not present

## 2015-09-26 DIAGNOSIS — T7840XA Allergy, unspecified, initial encounter: Secondary | ICD-10-CM

## 2015-09-26 MED ORDER — EPINEPHRINE 0.3 MG/0.3ML IJ SOAJ: 0.3000 mg | Freq: Once | INTRAMUSCULAR | Status: DC

## 2015-09-26 MED ORDER — DEXAMETHASONE SODIUM PHOSPHATE 100 MG/10ML IJ SOLN
10.0000 mg | Freq: Once | INTRAMUSCULAR | Status: AC
  Administered 2015-09-26: 10 mg via INTRAMUSCULAR

## 2015-09-26 MED ORDER — DIPHENHYDRAMINE HCL 25 MG PO CAPS: 25.0000 mg | ORAL_CAPSULE | Freq: Four times a day (QID) | ORAL | Status: DC | PRN

## 2015-09-26 MED ORDER — DEXAMETHASONE SODIUM PHOSPHATE 10 MG/ML IJ SOLN: 10.0000 mg | Freq: Once | INTRAMUSCULAR | Status: DC

## 2015-09-26 MED ORDER — RANITIDINE HCL 300 MG PO TABS: 150.0000 mg | ORAL_TABLET | Freq: Two times a day (BID) | ORAL | Status: DC

## 2015-09-26 MED ORDER — DIPHENHYDRAMINE HCL 50 MG/ML IJ SOLN
50.0000 mg | Freq: Once | INTRAMUSCULAR | Status: AC
  Administered 2015-09-26: 50 mg via INTRAMUSCULAR

## 2015-09-26 NOTE — Progress Notes (Signed)
Name: Kelly [name]   MRN: [id number]    DOB: [birth year]   Date:09/26/2015       Progress Note  Subjective  Chief Complaint  Chief Complaint  Patient presents with  . Allergic Reaction    rash from clindamycin    HPI  Rash: she took 3 doses of Clindamycin yesterday for a tooth abscess, last night felt nauseated, she woke up this am feeling cold, but skin was hot and red, with a red rash all over her face, but clear around her eyes, some whelps. No angioedema. No problems breathing, no chest pain, no vomiting or diarrhea. She is allergic to multiple antibiotics and unfortunately we will not change to another one. Advised to gargle with peroxide rinses.   Patient Active Problem List   Diagnosis Date Noted  . Hematuria 04/09/2015  . Hyperglycemia 04/06/2015  . Right lumbar radiculitis 12/28/2014  . Anxiety and depression 12/27/2014  . Gastro-esophageal reflux disease without esophagitis 12/27/2014  . Bulge of lumbar disc without myelopathy 12/27/2014  . Dysmetabolic syndrome [id number]  . Migraine without aura and responsive to treatment 12/27/2014  . Osteoarthrosis 12/27/2014  . Obesity (BMI 30-39.9) 12/27/2014  . Restless leg 12/27/2014  . Allergic rhinitis, seasonal 12/27/2014    Past Surgical History  Procedure Laterality Date  . Tonsillectomy and adenoidectomy    . Urethral stricture dilatation    . Knee surgery Left 05/2011    Dr. Rudene Christians- Arthroscopic    Family History  Problem Relation Age of Onset  . Cancer Paternal Grandmother     Social History   Social History  . Marital Status: Married    Spouse Name: N/A  . Number of Children: N/A  . Years of Education: N/A   Occupational History  . Not on file.   Social History Main Topics  . Smoking status: Former Smoker -- 0.75 packs/day for 2 years    Types: Cigarettes    Start date: 07/06/1981    Quit date: 07/07/1983  . Smokeless tobacco: Never Used  . Alcohol Use: No  . Drug Use: No  . Sexual  Activity:    Partners: Male   Other Topics Concern  . Not on file   Social History Narrative     Current outpatient prescriptions:  .  ALPRAZolam (XANAX) 0.5 MG tablet, Take 1 tablet (0.5 mg total) by mouth at bedtime as needed., Disp: 30 tablet, Rfl: 0 .  cloNIDine (CATAPRES) 0.1 MG tablet, TAKE 1 TABLET (0.1 MG TOTAL) BY MOUTH AT BEDTIME AS NEEDED., Disp: 30 tablet, Rfl: 0 .  diphenhydrAMINE (BENADRYL) 25 mg capsule, Take 1 capsule (25 mg total) by mouth every 6 (six) hours as needed., Disp: 30 capsule, Rfl: 0 .  Elastic Bandages & Supports (MEDICAL COMPRESSION STOCKINGS) MISC, 2 Units by Does not apply route daily., Disp: 1 each, Rfl: 2 .  fluconazole (DIFLUCAN) 150 MG tablet, Take 1 tablet (150 mg total) by mouth every other day., Disp: 3 tablet, Rfl: 0 .  gabapentin (NEURONTIN) 300 MG capsule, TAKE ONE CAPSULE BY MOUTH THREE TIMES DAILY, Disp: 90 capsule, Rfl: 1 .  Hydrocodone-Acetaminophen 5-300 MG TABS, Take 1 tablet by mouth every 4 (four) hours as needed (Pain)., Disp: , Rfl:  .  loratadine (CLARITIN) 10 MG tablet, Take 1 tablet (10 mg total) by mouth 2 (two) times daily., Disp: 60 tablet, Rfl: 5 .  meloxicam (MOBIC) 15 MG tablet, TAKE 1 TABLET (15 MG TOTAL) BY MOUTH DAILY., Disp: 30 tablet, Rfl: 1 .  omeprazole (  PRILOSEC) 40 MG capsule, TAKE ONE CAPSULE BY MOUTH EVERY MORNING, Disp: 30 capsule, Rfl: 5 .  pramipexole (MIRAPEX) 1.5 MG tablet, Take 1 tablet (1.5 mg total) by mouth at bedtime., Disp: 30 tablet, Rfl: 2 .  promethazine (PHENERGAN) 12.5 MG tablet, Take 1 tablet (12.5 mg total) by mouth every 8 (eight) hours as needed for nausea or vomiting., Disp: 12 tablet, Rfl: 0 .  ranitidine (ZANTAC) 300 MG tablet, Take 0.5 tablets (150 mg total) by mouth 2 (two) times daily., Disp: 30 tablet, Rfl: 0 .  SUMAtriptan (IMITREX) 50 MG tablet, Take 1 tablet by mouth as needed., Disp: , Rfl:  .  traMADol (ULTRAM) 50 MG tablet, Take 1 tablet (50 mg total) by mouth 2 (two) times daily as  needed., Disp: 90 tablet, Rfl: 0  Current facility-administered medications:  .  dexamethasone (DECADRON) injection 10 mg, 10 mg, Intramuscular, Once, Steele Sizer, MD .  diphenhydrAMINE (BENADRYL) injection 50 mg, 50 mg, Intramuscular, Once, Steele Sizer, MD  Allergies  Allergen Reactions  . Cephalexin   . Ciprofloxacin Hives  . Clindamycin/Lincomycin Hives  . Erythromycin   . Nitrofurantoin Monohyd Macro   . Penicillins   . Sulfa Antibiotics      ROS  Ten systems reviewed and is negative except as mentioned in HPI   Objective  Filed Vitals:   09/26/15 0805  BP: 106/58  Pulse: 84  Temp: 98.5 F (36.9 C)  TempSrc: Oral  Resp: 12  Weight: 206 lb (93.441 kg)  SpO2: 98%    Body mass index is 34.28 kg/(m^2).  Physical Exam  Constitutional: Patient appears well-developed and well-nourished. Obese  No distress.  HEENT: head atraumatic, normocephalic, pupils equal and reactive to light,neck supple, throat within normal limits Cardiovascular: Normal rate, regular rhythm and normal heart sounds.  No murmur heard. No BLE edema. Pulmonary/Chest: Effort normal and breath sounds normal. No respiratory distress. Skin: erythematous rash, worse on face and trunk, with whelps, no angioedema Abdominal: Soft.  There is no tenderness. Psychiatric: Patient has a normal mood and affect. behavior is normal. Judgment and thought content normal.  PHQ2/9: Depression screen [facility] 2/9 09/02/2015 04/29/2015 04/05/2015 12/28/2014  Decreased Interest 0 0 0 0  Down, Depressed, Hopeless 0 0 1 0  PHQ - 2 Score 0 0 1 0    Fall Risk: Fall Risk  09/02/2015 04/29/2015 04/05/2015 12/28/2014  Falls in the past year? No Yes Yes Yes  Number falls in past yr: - 1 1 1   Injury with Fall? - No No Yes  Follow up - - - Falls evaluation completed     Assessment & Plan  1. Tooth abscess  Mouthwash rinses  2. Allergic reaction caused by a drug  Go to [facility] if difficulty breathing or chest pain, or  worsening of symptoms. Discontinue Clindamycin, last dose was last night. Husband is coming in to pick her up - ranitidine (ZANTAC) 300 MG tablet; Take 0.5 tablets (150 mg total) by mouth 2 (two) times daily.  Dispense: 30 tablet; Refill: 0 - diphenhydrAMINE (BENADRYL) 25 mg capsule; Take 1 capsule (25 mg total) by mouth every 6 (six) hours as needed.  Dispense: 30 capsule; Refill: 0 - dexamethasone (DECADRON) injection 10 mg; Inject 1 mL (10 mg total) into the muscle once. - diphenhydrAMINE (BENADRYL) injection 50 mg; Inject 1 mL (50 mg total) into the muscle once.

## 2015-09-27 ENCOUNTER — Ambulatory Visit: Payer: 59 | Admitting: Family Medicine

## 2015-09-27 ENCOUNTER — Telehealth: Payer: Self-pay

## 2015-09-27 MED ORDER — PREDNISONE 10 MG (21) PO TBPK: 10.0000 mg | ORAL_TABLET | Freq: Every day | ORAL | Status: DC

## 2015-09-27 NOTE — Telephone Encounter (Signed)
Patient was informed of Dr. Ancil Boozer' message and said thanks.

## 2015-09-27 NOTE — Telephone Encounter (Signed)
I will send prednisone, but if symptoms do not improve, go to [facility]

## 2015-09-27 NOTE — Telephone Encounter (Signed)
Patient stated that her sx are about the same. She stated that she is still pretty red and itchy. She also stated that it has gone all the way down to her feet now. She is taking Benadryl q4h (2 tablets). Patient stated they go away but then flare back up worse than before. Patient has been trying to keep still, cool and calm. She stated that she still is running a low grade fever and has chills.  Please advise.

## 2015-09-30 ENCOUNTER — Ambulatory Visit: Payer: 59 | Admitting: Family Medicine

## 2015-09-30 ENCOUNTER — Ambulatory Visit (INDEPENDENT_AMBULATORY_CARE_PROVIDER_SITE_OTHER): Payer: 59 | Admitting: Family Medicine

## 2015-09-30 ENCOUNTER — Encounter: Payer: Self-pay | Admitting: Family Medicine

## 2015-09-30 VITALS — BP 122/70 | HR 96 | Temp 98.6°F | Resp 16 | Wt 212.1 lb

## 2015-09-30 DIAGNOSIS — L509 Urticaria, unspecified: Secondary | ICD-10-CM

## 2015-09-30 MED ORDER — HYDROXYZINE HCL 25 MG PO TABS: 25.0000 mg | ORAL_TABLET | Freq: Three times a day (TID) | ORAL | Status: DC | PRN

## 2015-09-30 MED ORDER — DIPHENHYDRAMINE HCL 50 MG/ML IJ SOLN: 50.0000 mg | Freq: Once | INTRAMUSCULAR | Status: DC

## 2015-09-30 MED ORDER — DEXAMETHASONE SODIUM PHOSPHATE 10 MG/ML IJ SOLN: 10.0000 mg | Freq: Once | INTRAMUSCULAR | Status: DC

## 2015-09-30 NOTE — Progress Notes (Signed)
Name: Kelly [name]   MRN: [id number]    DOB: [birth year]   Date:09/30/2015       Progress Note  Subjective  Chief Complaint  Chief Complaint  Patient presents with  . Allergic Reaction    hives persists    HPI  Hives: symptoms started after she tried Clindamycin, however she was given dexamethasone, benadryl and has been on prednisone, benadryl and Ranitidine but she still has hives. She states is slightly better since yesterday. She was seen by immunologist in the past and had allergy testing that was negative. This is the worst episode in the past 25 years. We still can't pinpoint the cause of her hives.  Explained that it may be secondary to stress also.   Toothache: she is seeing oral surgeon today, the pain has improved, had a fever last week but resolved   Patient Active Problem List   Diagnosis Date Noted  . Hematuria 04/09/2015  . Hyperglycemia 04/06/2015  . Right lumbar radiculitis 12/28/2014  . Anxiety and depression 12/27/2014  . Gastro-esophageal reflux disease without esophagitis 12/27/2014  . Bulge of lumbar disc without myelopathy 12/27/2014  . Dysmetabolic syndrome [id number]  . Migraine without aura and responsive to treatment 12/27/2014  . Osteoarthrosis 12/27/2014  . Obesity (BMI 30-39.9) 12/27/2014  . Restless leg 12/27/2014  . Allergic rhinitis, seasonal 12/27/2014    Past Surgical History  Procedure Laterality Date  . Tonsillectomy and adenoidectomy    . Urethral stricture dilatation    . Knee surgery Left 05/2011    Dr. Rudene Christians- Arthroscopic    Family History  Problem Relation Age of Onset  . Cancer Paternal Grandmother     Social History   Social History  . Marital Status: Married    Spouse Name: N/A  . Number of Children: N/A  . Years of Education: N/A   Occupational History  . Not on file.   Social History Main Topics  . Smoking status: Former Smoker -- 0.75 packs/day for 2 years    Types: Cigarettes    Start date: 07/06/1981     Quit date: 07/07/1983  . Smokeless tobacco: Never Used  . Alcohol Use: No  . Drug Use: No  . Sexual Activity:    Partners: Male   Other Topics Concern  . Not on file   Social History Narrative     Current outpatient prescriptions:  .  ALPRAZolam (XANAX) 0.5 MG tablet, Take 1 tablet (0.5 mg total) by mouth at bedtime as needed., Disp: 30 tablet, Rfl: 0 .  cloNIDine (CATAPRES) 0.1 MG tablet, TAKE 1 TABLET (0.1 MG TOTAL) BY MOUTH AT BEDTIME AS NEEDED., Disp: 30 tablet, Rfl: 0 .  diphenhydrAMINE (BENADRYL) 25 mg capsule, Take 1 capsule (25 mg total) by mouth every 6 (six) hours as needed., Disp: 30 capsule, Rfl: 0 .  Elastic Bandages & Supports (MEDICAL COMPRESSION STOCKINGS) MISC, 2 Units by Does not apply route daily., Disp: 1 each, Rfl: 2 .  EPINEPHrine 0.3 mg/0.3 mL IJ SOAJ injection, Inject 0.3 mLs (0.3 mg total) into the muscle once., Disp: 1 Device, Rfl: 1 .  gabapentin (NEURONTIN) 300 MG capsule, TAKE ONE CAPSULE BY MOUTH THREE TIMES DAILY, Disp: 90 capsule, Rfl: 1 .  Hydrocodone-Acetaminophen 5-300 MG TABS, Take 1 tablet by mouth every 4 (four) hours as needed (Pain)., Disp: , Rfl:  .  hydrOXYzine (ATARAX/VISTARIL) 25 MG tablet, Take 1 tablet (25 mg total) by mouth 3 (three) times daily as needed., Disp: 30 tablet, Rfl: 0 .  loratadine (CLARITIN) 10 MG tablet, Take 1 tablet (10 mg total) by mouth 2 (two) times daily., Disp: 60 tablet, Rfl: 5 .  meloxicam (MOBIC) 15 MG tablet, TAKE 1 TABLET (15 MG TOTAL) BY MOUTH DAILY., Disp: 30 tablet, Rfl: 1 .  omeprazole (PRILOSEC) 40 MG capsule, TAKE ONE CAPSULE BY MOUTH EVERY MORNING, Disp: 30 capsule, Rfl: 5 .  pramipexole (MIRAPEX) 1.5 MG tablet, Take 1 tablet (1.5 mg total) by mouth at bedtime., Disp: 30 tablet, Rfl: 2 .  predniSONE (STERAPRED UNI-PAK 21 TAB) 10 MG (21) TBPK tablet, Take 1 tablet (10 mg total) by mouth daily., Disp: 21 tablet, Rfl: 0 .  ranitidine (ZANTAC) 300 MG tablet, Take 0.5 tablets (150 mg total) by mouth 2 (two) times  daily., Disp: 30 tablet, Rfl: 0 .  SUMAtriptan (IMITREX) 50 MG tablet, Take 1 tablet by mouth as needed., Disp: , Rfl:  .  traMADol (ULTRAM) 50 MG tablet, Take 1 tablet (50 mg total) by mouth 2 (two) times daily as needed., Disp: 90 tablet, Rfl: 0  Allergies  Allergen Reactions  . Cephalexin   . Ciprofloxacin Hives  . Clindamycin/Lincomycin Hives  . Erythromycin   . Nitrofurantoin Monohyd Macro   . Penicillins   . Sulfa Antibiotics      ROS  Ten systems reviewed and is negative except as mentioned in HPI   She has noticed mild headache and occasional nausea  Objective  Filed Vitals:   09/30/15 0807  BP: 122/70  Pulse: 96  Temp: 98.6 F (37 C)  TempSrc: Oral  Resp: 16  Weight: 212 lb 1.6 oz (96.208 kg)  SpO2: 98%    Body mass index is 35.3 kg/(m^2).  Physical Exam  Constitutional: Patient appears well-developed and well-nourished. Obese No distress.  HEENT: head atraumatic, normocephalic, pupils equal and reactive to light,neck supple, throat within normal limits Cardiovascular: Normal rate, regular rhythm and normal heart sounds. No murmur heard. No BLE edema. Pulmonary/Chest: Effort normal and breath sounds normal. No respiratory distress. Skin: erythematous rash, worse on her legs today, face is almost cleared, some also on arms and trunk, a few  whelps, no angioedema  Abdominal: Soft. There is no tenderness. Psychiatric: Patient has a normal mood and affect. behavior is normal. Judgment and thought content normal  PHQ2/9: Depression screen [facility] 2/9 09/30/2015 09/02/2015 04/29/2015 04/05/2015 12/28/2014  Decreased Interest 0 0 0 0 0  Down, Depressed, Hopeless 0 0 0 1 0  PHQ - 2 Score 0 0 0 1 0     Fall Risk: Fall Risk  09/30/2015 09/02/2015 04/29/2015 04/05/2015 12/28/2014  Falls in the past year? No No Yes Yes Yes  Number falls in past yr: - - 1 1 1   Injury with Fall? - - No No Yes  Follow up - - - - Falls evaluation completed    Functional Status  Survey: Is the patient deaf or have difficulty hearing?: No Does the patient have difficulty seeing, even when wearing glasses/contacts?: No Does the patient have difficulty concentrating, remembering, or making decisions?: No Does the patient have difficulty walking or climbing stairs?: Yes (due to bilateral foot/ leg pain) Does the patient have difficulty dressing or bathing?: No Does the patient have difficulty doing errands alone such as visiting a doctor's office or shopping?: No    Assessment & Plan  1. Hives  Extend excuse for work since medication causes sedation  - hydrOXYzine (ATARAX/VISTARIL) 25 MG tablet; Take 1 tablet (25 mg total) by mouth 3 (three) times daily  as needed.  Dispense: 30 tablet; Refill: 0 - dexamethasone (DECADRON) injection 10 mg; Inject 1 mL (10 mg total) into the muscle once. - diphenhydrAMINE (BENADRYL) injection 50 mg; Inject 1 mL (50 mg total) into the muscle once.

## 2015-10-10 ENCOUNTER — Other Ambulatory Visit: Payer: Self-pay | Admitting: Family Medicine

## 2015-10-10 NOTE — Telephone Encounter (Signed)
Patient requesting refill. 

## 2015-10-19 ENCOUNTER — Other Ambulatory Visit: Payer: Self-pay | Admitting: Family Medicine

## 2015-10-23 ENCOUNTER — Ambulatory Visit: Payer: 59 | Attending: Otolaryngology

## 2015-10-23 DIAGNOSIS — F5101 Primary insomnia: Secondary | ICD-10-CM | POA: Insufficient documentation

## 2015-10-23 DIAGNOSIS — R0683 Snoring: Secondary | ICD-10-CM | POA: Diagnosis present

## 2015-10-23 DIAGNOSIS — G4700 Insomnia, unspecified: Secondary | ICD-10-CM | POA: Diagnosis present

## 2015-10-24 ENCOUNTER — Ambulatory Visit (INDEPENDENT_AMBULATORY_CARE_PROVIDER_SITE_OTHER): Payer: 59 | Admitting: Podiatry

## 2015-10-24 ENCOUNTER — Encounter: Payer: Self-pay | Admitting: Podiatry

## 2015-10-24 ENCOUNTER — Ambulatory Visit (INDEPENDENT_AMBULATORY_CARE_PROVIDER_SITE_OTHER): Payer: 59

## 2015-10-24 VITALS — BP 139/80 | HR 69 | Resp 18

## 2015-10-24 DIAGNOSIS — R52 Pain, unspecified: Secondary | ICD-10-CM

## 2015-10-24 MED ORDER — MELOXICAM 7.5 MG PO TABS: 7.5000 mg | ORAL_TABLET | Freq: Every day | ORAL | Status: DC

## 2015-10-24 NOTE — Patient Instructions (Signed)
Posterior Tibial Tendon Tendinitis With Rehab Tendonitis is a condition that is characterized by inflammation of a tendon or the lining (sheath) that surrounds it. The inflammation is usually caused by damage to the tendon, such as a tendon tear (strain). Sprains are classified into three categories. Grade 1 sprains cause pain, but the tendon is not lengthened. Grade 2 sprains include a lengthened ligament due to the ligament being stretched or partially ruptured. With grade 2 sprains there is still function, although the function may be diminished. Grade 3 sprains are characterized by a complete tear of the tendon or muscle, and function is usually impaired. Posterior tibialis tendonitis is tendonitis of the posterior tibial tendon, which attaches muscles of the lower leg to the foot. The posterior tibial tendon is located in the back of the ankle and helps the body straighten (plantar flex) and rotate inward (medially rotate) the ankle. SYMPTOMS   Pain, tenderness, swelling, warmth, and/or redness over the back of the inner ankle at the posterior tibial tendon or the inner part of the mid-foot.  Pain that worsens with plantar flexion or medial rotation of the ankle.  A crackling sound (crepitation) when the tendon is moved or touched. CAUSES  Posterior tibial tendonitis occurs when damage to the posterior tibial tendon starts an inflammatory response. Common mechanisms of injury include:  Degenerative (occurs with aging) processes that weaken the tendon and make it more susceptible to injury.  Stress placed on the tendon from an increase in the intensity, frequency, or duration of training.  Direct trauma to the ankle.  Returning to activity before a previous ankle injury is allowed to heal. RISK INCREASES WITH:  Activities that involve repetitive and/or stressful plantar flexion (jumping, kicking, or running up/down hills).  Poor strength and flexibility.  Flat feet.  Previous injury to  the foot, ankle, or leg. PREVENTION   Warm up and stretch properly before activity.  Allow for adequate recovery between workouts.  Maintain physical fitness:  Strength, flexibility, and endurance.  Cardiovascular fitness.  Learn and use proper technique. When possible, have a coach correct improper technique.  Complete rehabilitation from a previous foot, ankle, or leg injury.  If you have flat feet, wear arch supports (orthotics). PROGNOSIS  If treated properly, the symptoms of tendonitis usually resolve within 6 weeks. This period may be shorter for injuries caused by direct trauma. RELATED COMPLICATIONS   Prolonged healing time, if improperly treated or reinjured.  Recurrent symptoms that result in a chronic problem.  Partial or complete tendon tear (rupture) requiring surgery. TREATMENT  Treatment initially involves the use of ice and medication to help reduce pain and inflammation. The use of strengthening and stretching exercises may help reduce pain with activity. These exercises may be performed at home or with referral to a therapist. Often times, your caregiver will recommend immobilizing the ankle to allow the tendon to heal. If you have flat feet, you may be advised to wear orthotic arch supports. If symptoms persist for greater than 6 months despite nonsurgical (conservative) treatment, then surgery may be recommended. MEDICATION   If pain medication is necessary, then nonsteroidal anti-inflammatory medications, such as aspirin and ibuprofen, or other minor pain relievers, such as acetaminophen, are often recommended.  Do not take pain medication for 7 days before surgery.  Prescription pain relievers may be given if deemed necessary by your caregiver. Use only as directed and only as much as you need.  Corticosteroid injections may be given by your caregiver. These injections should   be reserved for the most serious cases because they may only be given a certain  number of times. HEAT AND COLD  Cold treatment (icing) relieves pain and reduces inflammation. Cold treatment should be applied for 10 to 15 minutes every 2 to 3 hours for inflammation and pain and immediately after any activity that aggravates your symptoms. Use ice packs or massage the area with a piece of ice (ice massage).  Heat treatment may be used prior to performing the stretching and strengthening activities prescribed by your caregiver, physical therapist, or athletic trainer. Use a heat pack or soak the injury in warm water. SEEK MEDICAL CARE IF:  Treatment seems to offer no benefit, or the condition worsens.  Any medications produce adverse side effects. EXERCISES RANGE OF MOTION (ROM) AND STRETCHING EXERCISES - Posterior Tibial Tendon Tendinitis These exercises may help you when beginning to rehabilitate your injury. Your symptoms may resolve with or without further involvement from your physician, physical therapist or athletic trainer. While completing these exercises, remember:   Restoring tissue flexibility helps normal motion to return to the joints. This allows healthier, less painful movement and activity.  An effective stretch should be held for at least 30 seconds.  A stretch should never be painful. You should only feel a gentle lengthening or release in the stretched tissue. RANGE OF MOTION - Ankle Plantar Flexion   Sit with your right / left leg crossed over your opposite knee.  Use your opposite hand to pull the top of your foot and toes toward you.  You should feel a gentle stretch on the top of your foot/ankle. Hold this position for __________ seconds. Repeat __________ times. Complete this exercise __________ times per day.  RANGE OF MOTION - Ankle Eversion   Sit with your right / left ankle crossed over your opposite knee.  Grip your foot with your opposite hand, placing your thumb on the top of your foot and your fingers across the bottom of your  foot.  Gently push your foot downward with a slight rotation so your littlest toes rise slightly.  You should feel a gentle stretch on the inside of your ankle. Hold the stretch for __________ seconds. Repeat __________ times. Complete this exercise __________ times per day.  RANGE OF MOTION - Ankle Inversion   Sit with your right / left ankle crossed over your opposite knee.  Grip your foot with your opposite hand, placing your thumb on the bottom of your foot and your fingers across the top of your foot.  Gently pull your foot so the smallest toe comes toward you and your thumb pushes the inside of the ball of your foot away from you.  You should feel a gentle stretch on the outside of your ankle. Hold the stretch for __________ seconds. Repeat __________ times. Complete this exercise __________ times per day.  RANGE OF MOTION - Dorsi/Plantar Flexion  While sitting with your right / left knee straight, draw the top of your foot upward by flexing your ankle. Then reverse the motion, pointing your toes downward.  Hold each position for __________ seconds.  After completing your first set of exercises, repeat this exercise with your knee bent. Repeat __________ times. Complete this exercise __________ times per day.  RANGE OF MOTION - Ankle Alphabet  Imagine your right / left big toe is a pen.  Keeping your hip and knee still, write out the entire alphabet with your "pen." Make the letters as large as you can without   increasing any discomfort. Repeat __________ times. Complete this exercise __________ times per day.  STRETCH - Gastrocsoleus   Sit with your right / left leg extended. Holding onto both ends of a belt or towel, loop it around the ball of your foot.  Keeping your right / left ankle and foot relaxed and your knee straight, pull your foot and ankle toward you using the belt/towel.  You should feel a gentle stretch behind your calf or knee. Hold this position for  __________ seconds. Repeat __________ times. Complete this exercise __________ times per day.  STRETCH - Gastroc, Standing   Place hands on wall.  Extend right / left leg, keeping the front knee somewhat bent.  Slightly point your toes inward on your back foot.  Keeping your right / left heel on the floor and your knee straight, shift your weight toward the wall, not allowing your back to arch.  You should feel a gentle stretch in the right / left calf. Hold this position for __________ seconds. Repeat __________ times. Complete this stretch __________ times per day. STRETCH - Soleus, Standing   Place hands on wall.  Extend right / left leg, keeping the other knee somewhat bent.  Slightly point your toes inward on your back foot.  Keep your right / left heel on the floor, bend your back knee, and slightly shift your weight over the back leg so that you feel a gentle stretch deep in your back calf.  Hold this position for __________ seconds. Repeat __________ times. Complete this stretch __________ times per day. STRENGTHENING EXERCISES - Posterior Tibial Tendon Tendinitis These exercises may help you when beginning to rehabilitate your injury. They may resolve your symptoms with or without further involvement from your physician, physical therapist, or athletic trainer. While completing these exercises, remember:   Muscles can gain both the endurance and the strength needed for everyday activities through controlled exercises.  Complete these exercises as instructed by your physician, physical therapist, or athletic trainer. Progress the resistance and repetitions only as guided. STRENGTH - Dorsiflexors  Secure a rubber exercise band/tubing to a fixed object (i.e., table, pole) and loop the other end around your right / left foot.  Sit on the floor facing the fixed object. The band/tubing should be slightly tense when your foot is relaxed.  Slowly draw your foot back toward you  using your ankle and toes.  Hold this position for __________ seconds. Slowly release the tension in the band and return your foot to the starting position. Repeat __________ times. Complete this exercise __________ times per day.  STRENGTH - Towel Curls  Sit in a chair positioned on a non-carpeted surface.  Place your foot on a towel, keeping your heel on the floor.  Pull the towel toward your heel by only curling your toes. Keep your heel on the floor.  If instructed by your physician, physical therapist, or athletic trainer, add ____________________ at the end of the towel. Repeat __________ times. Complete this exercise __________ times per day. STRENGTH - Ankle Eversion   Secure one end of a rubber exercise band/tubing to a fixed object (table, pole). Loop the other end around your foot just before your toes.  Place your fists between your knees. This will focus your strengthening at your ankle.  Drawing the band/tubing across your opposite foot, slowly pull your little toe out and up. Make sure the band/tubing is positioned to resist the entire motion.  Hold this position for __________ seconds.  Have   your muscles resist the band/tubing as it slowly pulls your foot back to the starting position. Repeat __________ times. Complete this exercise __________ times per day.  STRENGTH - Ankle Inversion   Secure one end of a rubber exercise band/tubing to a fixed object (table, pole). Loop the other end around your foot just before your toes.  Place your fists between your knees. This will focus your strengthening at your ankle.  Slowly, pull your big toe up and in, making sure the band/tubing is positioned to resist the entire motion.  Hold this position for __________ seconds.  Have your muscles resist the band/tubing as it slowly pulls your foot back to the starting position. Repeat __________ times. Complete this exercises __________ times per day.    This information is not  intended to replace advice given to you by your health care provider. Make sure you discuss any questions you have with your health care provider.   Document Released: 06/22/2005 Document Revised: 11/06/2014 Document Reviewed: 10/04/2008 Elsevier Interactive Patient Education 2016 [employer]  

## 2015-10-24 NOTE — Progress Notes (Signed)
   Subjective:    Patient ID: [name], female    DOB: February 15, 1966, 50 y.o.   MRN: [id number]  HPI  51 year old female presents the office today for concerns of pain to both of her ankles in the ball of her foot which is been ongoing since last August. She states this started about the time she started going back to work doing more standing and walking. She denies any recent injury or trauma. She gets occasional swelling to her ankles. No overlying redness or warmth. No tingling or numbness. The pain does not wake her up at night. The pain is worse with walking. She has tried changing shoes and some over-the-counter inserts as well as stockings. No other complaints.   Review of Systems  All other systems reviewed and are negative.      Objective:   Physical Exam General: AAO x3, NAD  Dermatological: Bilateral hallux nails appear to be hypertrophic, dystrophic, discolored and loose the underlying nail bed. No open lesions or pre-ulcerative lesions.   Vascular: Dorsalis Pedis artery and Posterior Tibial artery pedal pulses are 2/4 bilateral with immedate capillary fill time. Pedal hair growth present. No varicosities and no lower extremity edema present bilateral. There is no pain with calf compression, swelling, warmth, erythema.   Neruologic: Grossly intact via light touch bilateral. Vibratory intact via tuning fork bilateral. Protective threshold with Semmes Wienstein monofilament intact to all pedal sites bilateral. Patellar and Achilles deep tendon reflexes 2+ bilateral. No Babinski or clonus noted bilateral.   Musculoskeletal: There is no area pinpoint tenderness or pain the vibratory sensation. There is some discomfort on the sesamoid complex bilaterally. There is pain on the course the posterior tibial tendon bilaterally posterior inferior to the medial malleolus swelling insertion of the navicular tuberosity. She is able to perform a single and double heel rise however the left  side is more painful to do so than the right. There is also some discomfort on the course the peroneal tendons. Tendons appear to be intact. Ankle, subtalar range of motion is intact. There is mild pronation. Muscular strength 5/5 in all groups tested bilateral.  Gait: Unassisted, Nonantalgic.     Assessment & Plan:  50 year old female with posterior tibial, peroneal tendinitis; sesamoiditis  -Treatment options discussed including all alternatives, risks, and complications -Etiology of symptoms were discussed -X-rays were obtained and reviewed with the patient. No evidence of acute fracture or stress fracture. -Discussed orthotics with the patient. This point slightly custom. We'll check insurance coverage. -Stretching, rehabilitation exercises discussed Prescribed mobic. Discussed side effects of the medication and directed to stop if any are to occur and call the office.  -Ice -Shoegear changes -Follow-up in 3-4 weeks or sooner if any problems arise. In the meantime, encouraged to call the office with any questions, concerns, change in symptoms.   Celesta Gentile, DPM

## 2015-10-31 ENCOUNTER — Telehealth: Payer: Self-pay

## 2015-10-31 NOTE — Telephone Encounter (Signed)
Called patient to review results from her sleep study and to relay Dr. Ancil Boozer' message but there was no answer. A message was left for her with the results and she was encouraged to give [place] a call if she had any questions or concerns.

## 2015-11-13 ENCOUNTER — Encounter: Payer: Self-pay | Admitting: Urology

## 2015-11-13 ENCOUNTER — Ambulatory Visit: Payer: 59 | Admitting: Urology

## 2015-11-17 ENCOUNTER — Other Ambulatory Visit: Payer: Self-pay | Admitting: Family Medicine

## 2015-11-21 ENCOUNTER — Telehealth: Payer: Self-pay | Admitting: *Deleted

## 2015-11-21 ENCOUNTER — Encounter: Payer: Self-pay | Admitting: Podiatry

## 2015-11-21 ENCOUNTER — Ambulatory Visit (INDEPENDENT_AMBULATORY_CARE_PROVIDER_SITE_OTHER): Payer: 59 | Admitting: Podiatry

## 2015-11-21 VITALS — BP 135/79 | HR 70 | Resp 18

## 2015-11-21 DIAGNOSIS — M779 Enthesopathy, unspecified: Secondary | ICD-10-CM | POA: Diagnosis not present

## 2015-11-21 DIAGNOSIS — R609 Edema, unspecified: Secondary | ICD-10-CM

## 2015-11-21 DIAGNOSIS — I872 Venous insufficiency (chronic) (peripheral): Secondary | ICD-10-CM

## 2015-11-21 DIAGNOSIS — M79673 Pain in unspecified foot: Secondary | ICD-10-CM

## 2015-11-21 MED ORDER — MELOXICAM 15 MG PO TABS: 15.0000 mg | ORAL_TABLET | Freq: Every day | ORAL | Status: DC

## 2015-11-21 NOTE — Telephone Encounter (Addendum)
-----   Message from Trula Slade, DPM sent at 11/21/2015 11:34 AM EDT ----- Can you please order venous reflux study.  Faxed to [facility].  11/22/2015-[employer] APPROVED VENOUS REFLUX, REFERENCE [id number]. Faxed to [facility].  12/05/2015-DrJacqualyn Posey reviewed 11/24/2015 Venous duplex reflux as venous incompetence, ordered compression hoses.  I informed pt of Dr. Leigh Aurora orders and pt states she has tried multiple styles of compression hose and all cause legs and feet to swell, the only ones comfortable are the 15-2mmHg. I 1st instructed pt to shower at night and then put the compression hose on before swinging legs over the side of the bed, pt was shocked and states she didn't do it that way and would try.  I looked at pt's SnapShot and estimated she may have big calves and little ankles and told her I would see if Dr. Jacqualyn Posey wanted to prescribe custom fitted compression hose for more support and better fit.  Pt states she would be willing to try. Pt states she is also wearing the orthotics and getting a little comfort.  I offered pt an appt and she agreed. I will have the schedulers call pt in the morning to set up and appt.

## 2015-11-21 NOTE — Telephone Encounter (Deleted)
-----   Message from Trula Slade, DPM sent at 11/21/2015 11:34 AM EDT ----- Can you please order venous reflux study

## 2015-11-22 NOTE — Progress Notes (Signed)
Patient ID: Kelly [name], female   DOB: 12/15/1965, 50 y.o.   MRN: [id number]  Subjective: 50 year old female presents the office they for follow-up evaluation of bilateral ankle pain. She states that since slowly she started wearing ankle braces and this has been actually helping her foot. Overall she does still her feet have improved however she does still get pain to her ankles she also states that she is beginning swelling to her leg to her toes on both legs for quite some time and she has tried over-the-counter compression sock however it was too tight. Denies any cramping in her calf or any warmth or redness. Denies any systemic complaints such as fevers, chills, nausea, vomiting. No acute changes since last appointment, and no other complaints at this time.   Objective: AAO x3, NAD DP/PT pulses palpable bilaterally, CRT less than 3 seconds Protective sensation intact with Simms Weinstein monofilament There is mild but improved tenderness along the course of the posterior tibial tendon posterior inferior to the main malleolus bilaterally the pain is about equal on the sides. There is no pain along the insertion the navicular tuberosity. She can perform a single and double heel rise. There is no other areas of pinpoint bony tenderness there is no pain vibratory sensation. There does not appear to be any significant swelling to the legs or feet at this time. There is mild decrease in medial arch height. No areas of pinpoint bony tenderness or pain with vibratory sensation. MMT 5/5, ROM WNL. No edema, erythema, increase in warmth to bilateral lower extremities.  No open lesions or pre-ulcerative lesions.  No pain with calf compression, swelling, warmth, erythema  Assessment: 50 year old female with resolving tendinitis; chronic lower chemise edema possible venous insufficiency  Plan: -All treatment options discussed with the patient including all alternatives, risks, complications.  -Will  order a venous reflux study. Compression socks -Continue range of motion, rehabilitation exercises for tendinitis. Also discuss orthotics. She purchased power steps today. Also ice to the area. Ankle brace if needed. -Follow-up as scheduled. -Patient encouraged to call the office with any questions, concerns, change in symptoms.   Celesta Gentile, DPM

## 2015-11-25 ENCOUNTER — Encounter: Payer: Self-pay | Admitting: Family Medicine

## 2015-11-25 ENCOUNTER — Ambulatory Visit (INDEPENDENT_AMBULATORY_CARE_PROVIDER_SITE_OTHER): Payer: 59 | Admitting: Family Medicine

## 2015-11-25 VITALS — BP 118/70 | HR 95 | Temp 98.1°F | Resp 16 | Ht 65.0 in | Wt 207.7 lb

## 2015-11-25 DIAGNOSIS — G4700 Insomnia, unspecified: Secondary | ICD-10-CM | POA: Diagnosis not present

## 2015-11-25 DIAGNOSIS — N951 Menopausal and female climacteric states: Secondary | ICD-10-CM

## 2015-11-25 DIAGNOSIS — J302 Other seasonal allergic rhinitis: Secondary | ICD-10-CM | POA: Diagnosis not present

## 2015-11-25 DIAGNOSIS — G2581 Restless legs syndrome: Secondary | ICD-10-CM

## 2015-11-25 DIAGNOSIS — F419 Anxiety disorder, unspecified: Secondary | ICD-10-CM

## 2015-11-25 DIAGNOSIS — R232 Flushing: Secondary | ICD-10-CM

## 2015-11-25 DIAGNOSIS — G8929 Other chronic pain: Secondary | ICD-10-CM

## 2015-11-25 DIAGNOSIS — R600 Localized edema: Secondary | ICD-10-CM

## 2015-11-25 DIAGNOSIS — M549 Dorsalgia, unspecified: Secondary | ICD-10-CM | POA: Diagnosis not present

## 2015-11-25 DIAGNOSIS — F418 Other specified anxiety disorders: Secondary | ICD-10-CM | POA: Diagnosis not present

## 2015-11-25 DIAGNOSIS — F329 Major depressive disorder, single episode, unspecified: Secondary | ICD-10-CM

## 2015-11-25 DIAGNOSIS — G43009 Migraine without aura, not intractable, without status migrainosus: Secondary | ICD-10-CM | POA: Diagnosis not present

## 2015-11-25 DIAGNOSIS — F32A Depression, unspecified: Secondary | ICD-10-CM

## 2015-11-25 DIAGNOSIS — R0683 Snoring: Secondary | ICD-10-CM | POA: Diagnosis not present

## 2015-11-25 MED ORDER — LORATADINE 10 MG PO TABS: 10.0000 mg | ORAL_TABLET | Freq: Every day | ORAL | Status: DC

## 2015-11-25 MED ORDER — GABAPENTIN ENACARBIL ER 300 MG PO TBCR: 1.0000 | EXTENDED_RELEASE_TABLET | Freq: Every evening | ORAL | Status: DC

## 2015-11-25 MED ORDER — GABAPENTIN 300 MG PO CAPS: 300.0000 mg | ORAL_CAPSULE | Freq: Three times a day (TID) | ORAL | Status: DC

## 2015-11-25 MED ORDER — TRIAMCINOLONE ACETONIDE 55 MCG/ACT NA AERO: 2.0000 | INHALATION_SPRAY | Freq: Every day | NASAL | Status: DC

## 2015-11-25 MED ORDER — TRAZODONE HCL 50 MG PO TABS: 25.0000 mg | ORAL_TABLET | Freq: Every evening | ORAL | Status: DC | PRN

## 2015-11-25 MED ORDER — ALPRAZOLAM 0.5 MG PO TABS: 0.5000 mg | ORAL_TABLET | Freq: Every evening | ORAL | Status: DC | PRN

## 2015-11-25 NOTE — Progress Notes (Signed)
Name: Kelly [name]   MRN: [id number]    DOB: [birth year]   Date:11/25/2015       Progress Note  Subjective  Chief Complaint  Chief Complaint  Patient presents with  . Medication Refill    3 month F/U  . Insomnia    Still having trouble sleeping- 2-3 hours at a time then will be up.  Patient has been very tired due to unable to sleep for long periods of time  . Gastroesophageal Reflux    Under control with medication- Generic Prilosec every night  . Migraine    Patient migraines have subsided and quit taking Imitrex.   . Anxiety and Depression    Improving-last panic attack was 4 months ago. Patient has crying spells around her menstrual cycles.  . Hot Flashes    Patient is having hot flashes, night sweats and mood swings- medication is helping to control symptoms from being so extreme. Takes medication as needed    HPI  Metabolic Syndrome: she has been eating at work, denies polyphagia, polyuria or polydipsia. 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  She works 8 hours day, she states pain is okay while at work. She states at the end of the day her pain is severe - on the ball of her feet. She has tried different shoes, insoles without help. She also has noticed some lower leg swelling - she has tried compression stocking hoses but it seems to make symptoms worse. Swelling improves when she raises her legs. She does not have varicose veins. She saw Podiatrist and has insoles now, and she was referred to vascular surgeon.   RLS: she is back on Gabapentin and Mirapex, we will stop Mirapex and Gabapentin and try Horizant  Anxiety/Depression: she has a little emotional before her cycles, but is happy at work, no sadness. She states she still has anxiety and would like to take Xanax prn only, only one recent episode of panic attack. She does not need medication daily    Migraine Headaches: episodes are very seldom now, once every few months. Usually triggered when very tired. She states she no longer has insomnia and is doing better.   AR:  She has noticed some rhinorrhea, sneezing, and watery and itchy eyes. Sh has been out of Loratacine and nasal spray   Snoring: she states she wakes up feeling tired at times, snores loudly and at times she wakes up gasping at night. She had sleep study and was negative for OSA, she has RLS   Hot Flashes: clonidine is helping with symptoms  Patient Active Problem List   Diagnosis Date Noted  . Hematuria 04/09/2015  . Hyperglycemia 04/06/2015  . Right lumbar radiculitis 12/28/2014  . Anxiety and depression 12/27/2014  . Gastro-esophageal reflux disease without esophagitis 12/27/2014  . Bulge of lumbar disc without myelopathy 12/27/2014  . Dysmetabolic syndrome [id number]  . Migraine without aura and responsive to treatment 12/27/2014  . Osteoarthrosis 12/27/2014  . Obesity (BMI 30-39.9) 12/27/2014  . Restless leg 12/27/2014  . Allergic rhinitis, seasonal 12/27/2014    Past Surgical History  Procedure Laterality Date  . Tonsillectomy and adenoidectomy    . Urethral stricture dilatation    . Knee surgery Left 05/2011    Dr. Rudene Christians- Arthroscopic    Family History  Problem Relation Age of Onset  . Cancer Paternal  Grandmother     Social History   Social History  . Marital Status: Married    Spouse Name: N/A  . Number of Children: N/A  . Years of Education: N/A   Occupational History  . Not on file.   Social History Main Topics  . Smoking status: Former Smoker -- 0.75 packs/day for 2 years    Types: Cigarettes    Start date: 07/06/1981    Quit date: 07/07/1983  . Smokeless tobacco: Never Used  . Alcohol Use: No  . Drug Use: No  . Sexual Activity:    Partners: Male   Other Topics Concern  . Not on file   Social History Narrative     Current outpatient prescriptions:  .  ALPRAZolam  (XANAX) 0.5 MG tablet, Take 1 tablet (0.5 mg total) by mouth at bedtime as needed., Disp: 30 tablet, Rfl: 0 .  cloNIDine (CATAPRES) 0.1 MG tablet, TAKE 1 TABLET (0.1 MG TOTAL) BY MOUTH AT BEDTIME AS NEEDED., Disp: 30 tablet, Rfl: 2 .  EPINEPHrine 0.3 mg/0.3 mL IJ SOAJ injection, Inject 0.3 mLs (0.3 mg total) into the muscle once., Disp: 1 Device, Rfl: 1 .  gabapentin (NEURONTIN) 300 MG capsule, Take 1 capsule (300 mg total) by mouth 3 (three) times daily., Disp: 90 capsule, Rfl: 1 .  loratadine (CLARITIN) 10 MG tablet, Take 10 mg by mouth daily., Disp: , Rfl:  .  meloxicam (MOBIC) 15 MG tablet, Take 1 tablet (15 mg total) by mouth daily., Disp: 30 tablet, Rfl: 1 .  omeprazole (PRILOSEC) 40 MG capsule, TAKE ONE CAPSULE BY MOUTH EVERY MORNING, Disp: 30 capsule, Rfl: 5 .  pramipexole (MIRAPEX) 1.5 MG tablet, TAKE 1 TABLET BY MOUTH EVERY EVENING FOR RLS, Disp: 30 tablet, Rfl: 5 .  Triamcinolone Acetonide (NASACORT ALLERGY 24HR NA), Place 2 sprays into the nose at bedtime., Disp: , Rfl:  .  traZODone (DESYREL) 50 MG tablet, Take 0.5-1 tablets (25-50 mg total) by mouth at bedtime as needed for sleep., Disp: 30 tablet, Rfl: 2  Current facility-administered medications:  .  dexamethasone (DECADRON) injection 10 mg, 10 mg, Intramuscular, Once, Steele Sizer, MD .  diphenhydrAMINE (BENADRYL) injection 50 mg, 50 mg, Intramuscular, Once, Steele Sizer, MD  Allergies  Allergen Reactions  . Cephalexin   . Ciprofloxacin Hives  . Clindamycin/Lincomycin Hives  . Erythromycin   . Nitrofurantoin Monohyd Macro   . Penicillins   . Sulfa Antibiotics      ROS  Constitutional: Negative for fever, positive for weight change.  Respiratory: Negative for cough and shortness of breath.   Cardiovascular: Negative for chest pain or palpitations.  Gastrointestinal: Negative for abdominal pain, no bowel changes.  Musculoskeletal: Positive for gait problem, no  joint swelling.  Skin: Negative for rash.   Neurological: Negative for dizziness or headache.  No other specific complaints in a complete review of systems (except as listed in HPI above).  Objective  Filed Vitals:   11/25/15 1007  BP: 118/70  Pulse: 95  Temp: 98.1 F (36.7 C)  TempSrc: Oral  Resp: 16  Height: 5\' 5"  (1.651 m)  Weight: 207 lb 11.2 oz (94.212 kg)  SpO2: 98%    Body mass index is 34.56 kg/(m^2).  Physical Exam  Constitutional: Patient appears well-developed and well-nourished. Obese  No distress.  HEENT: head atraumatic, normocephalic, pupils equal and reactive to light, neck supple, throat within normal limits Cardiovascular: Normal rate, regular rhythm and normal heart sounds.  No murmur heard. Trace BLE edema. Pulmonary/Chest: Effort normal  and breath sounds normal. No respiratory distress. Abdominal: Soft.  There is no tenderness. Psychiatric: Patient has a normal mood and affect. behavior is normal. Judgment and thought content normal.  PHQ2/9: Depression screen [facility] 2/9 09/30/2015 09/02/2015 04/29/2015 04/05/2015 12/28/2014  Decreased Interest 0 0 0 0 0  Down, Depressed, Hopeless 0 0 0 1 0  PHQ - 2 Score 0 0 0 1 0     Fall Risk: Fall Risk  09/30/2015 09/02/2015 04/29/2015 04/05/2015 12/28/2014  Falls in the past year? No No Yes Yes Yes  Number falls in past yr: - - 1 1 1   Injury with Fall? - - No No Yes  Follow up - - - - Falls evaluation completed      Assessment & Plan  1. Insomnia, persistent  - traZODone (DESYREL) 50 MG tablet; Take 0.5-1 tablets (25-50 mg total) by mouth at bedtime as needed for sleep.  Dispense: 30 tablet; Refill: 2  2. Migraine without aura and responsive to treatment  Intermittently, doing well at this time  3. Anxiety and depression  - ALPRAZolam (XANAX) 0.5 MG tablet; Take 1 tablet (0.5 mg total) by mouth at bedtime as needed.  Dispense: 30 tablet; Refill: 0  4. Allergic rhinitis, seasonal  - loratadine (CLARITIN) 10 MG tablet; Take 1 tablet (10 mg total) by  mouth daily.  Dispense: 30 tablet; Refill: 5 - triamcinolone (NASACORT ALLERGY 24HR) 55 MCG/ACT AERO nasal inhaler; Place 2 sprays into the nose at bedtime.  Dispense: 1 Inhaler; Refill: 5  5. Snoring  Negative for OSA, normal sleep study  6. Restless leg  - Gabapentin Enacarbil ER (HORIZANT) 300 MG TBCR; Take 1 tablet by mouth every evening.  Dispense: 30 tablet; Refill: 5  7. Bilateral lower extremity edema  Use exercises socks  8. Chronic back pain  We will try Horizant   9. Hot flashes  Continue clonidine

## 2015-11-28 ENCOUNTER — Ambulatory Visit: Payer: 59 | Admitting: Family Medicine

## 2015-12-10 ENCOUNTER — Encounter: Payer: Self-pay | Admitting: Podiatry

## 2015-12-10 ENCOUNTER — Ambulatory Visit (INDEPENDENT_AMBULATORY_CARE_PROVIDER_SITE_OTHER): Payer: 59 | Admitting: Podiatry

## 2015-12-10 DIAGNOSIS — M779 Enthesopathy, unspecified: Secondary | ICD-10-CM | POA: Diagnosis not present

## 2015-12-10 DIAGNOSIS — M25579 Pain in unspecified ankle and joints of unspecified foot: Secondary | ICD-10-CM

## 2015-12-10 DIAGNOSIS — M722 Plantar fascial fibromatosis: Secondary | ICD-10-CM

## 2015-12-10 DIAGNOSIS — R252 Cramp and spasm: Secondary | ICD-10-CM | POA: Diagnosis not present

## 2015-12-10 NOTE — Progress Notes (Signed)
Patient ID: Kelly [name], female   DOB: [birth year], 50 y.o.   MRN: [id number]  Subjective: 50 year old female presents the office if occur concerns and follow-up evaluation of bilateral ankle and foot pain which is been ongoing for several years. Since last appointment she is been wearing a light compression sock to her legs which helped as well as wearing knee braces. She also has not some over-the-counter inserters helped some. She still continues to get pain to her ankles and her feet and she describes as a cramping sensation to the front of her ankles on both sides at times. No recent injury. Denies any systemic complaints such as fevers, chills, nausea, vomiting. No acute changes since last appointment, and no other complaints at this time.   Objective: AAO x3, NAD DP/PT pulses palpable bilaterally, CRT less than 3 seconds There is mild diffuse tenderness along bilateral ankles into the medial aspect of the foot along the medial band of plantar fascia. There is no specific area pinpoint bony tenderness there is no pain vibratory sensation. Along the medial aspect of the ankle bilaterally she describes a cramping sensation to her shoes, not experiencing this. There is no significant edema to the ankle or the legs at this time. There is no overlying erythema or increase in warmth. No areas of pinpoint bony tenderness or pain with vibratory sensation. MMT 5/5, ROM WNL. No edema, erythema, increase in warmth to bilateral lower extremities.  No open lesions or pre-ulcerative lesions.  No pain with calf compression, swelling, warmth, erythema  Assessment: Bilateral foot and ankle pain likely plantar fasciitis/arch pain, ankle pain  Plan: -All treatment options discussed with the patient including all alternatives, risks, complications.  -At this time she has been going to physical therapy for other issues and she has been doing this at home and wishes to hold off. She is also been using the  knee brace and she has seen some a for her knee and she recently underwent left knee surgery. Also continues with a compression sock. I do believe that this point given the longevity of symptoms that she likely benefit from a custom insert. She is awaiting her insurance approval. Given the cramping discuss further stretching exercises home and the night splint was dispensed as well. -Patient encouraged to call the office with any questions, concerns, change in symptoms.   Celesta Gentile, DPM

## 2015-12-24 ENCOUNTER — Ambulatory Visit: Payer: 59 | Admitting: Podiatry

## 2016-01-23 ENCOUNTER — Other Ambulatory Visit: Payer: Self-pay

## 2016-01-23 DIAGNOSIS — J302 Other seasonal allergic rhinitis: Secondary | ICD-10-CM

## 2016-01-23 MED ORDER — LORATADINE 10 MG PO TABS: 10.0000 mg | ORAL_TABLET | Freq: Every day | ORAL | Status: DC

## 2016-01-23 NOTE — Telephone Encounter (Signed)
Got a fax from [facility] requesting a 90 day supply of this patient's loratadine 10mg .  Refill request was sent to Dr. Steele Sizer for approval and submission.

## 2016-01-27 ENCOUNTER — Telehealth: Payer: Self-pay | Admitting: Family Medicine

## 2016-01-27 NOTE — Telephone Encounter (Signed)
She needs to be seen.

## 2016-01-27 NOTE — Telephone Encounter (Signed)
Patient is requesting medication for right hip pain

## 2016-01-28 NOTE — Telephone Encounter (Signed)
Dr. Ancil Boozer states patient would have to be seen for Right Hip Pain. Could you please schedule patient an appt.

## 2016-01-28 NOTE — Telephone Encounter (Signed)
LMOM to schedule an appt.

## 2016-02-06 ENCOUNTER — Other Ambulatory Visit: Payer: Self-pay | Admitting: Family Medicine

## 2016-02-06 NOTE — Telephone Encounter (Signed)
Patient requesting refill. 

## 2016-02-11 ENCOUNTER — Encounter: Payer: Self-pay | Admitting: Family Medicine

## 2016-02-11 ENCOUNTER — Ambulatory Visit (INDEPENDENT_AMBULATORY_CARE_PROVIDER_SITE_OTHER): Payer: 59 | Admitting: Family Medicine

## 2016-02-11 VITALS — BP 122/68 | HR 106 | Temp 98.2°F | Resp 18 | Ht 65.0 in | Wt 214.2 lb

## 2016-02-11 DIAGNOSIS — M5417 Radiculopathy, lumbosacral region: Secondary | ICD-10-CM | POA: Diagnosis not present

## 2016-02-11 DIAGNOSIS — F418 Other specified anxiety disorders: Secondary | ICD-10-CM

## 2016-02-11 DIAGNOSIS — F32A Depression, unspecified: Secondary | ICD-10-CM

## 2016-02-11 DIAGNOSIS — F419 Anxiety disorder, unspecified: Secondary | ICD-10-CM

## 2016-02-11 DIAGNOSIS — F329 Major depressive disorder, single episode, unspecified: Secondary | ICD-10-CM

## 2016-02-11 DIAGNOSIS — M4317 Spondylolisthesis, lumbosacral region: Secondary | ICD-10-CM | POA: Insufficient documentation

## 2016-02-11 DIAGNOSIS — M5416 Radiculopathy, lumbar region: Secondary | ICD-10-CM

## 2016-02-11 MED ORDER — PREDNISONE 10 MG (48) PO TBPK: ORAL_TABLET | Freq: Every day | ORAL | 0 refills | Status: DC

## 2016-02-11 MED ORDER — METAXALONE 800 MG PO TABS: 800.0000 mg | ORAL_TABLET | Freq: Three times a day (TID) | ORAL | 0 refills | Status: DC

## 2016-02-11 NOTE — Progress Notes (Signed)
Name: Kelly [name]   MRN: [id number]    DOB: 1965-09-23   Date:02/11/2016       Progress Note  Subjective  Chief Complaint  Chief Complaint  Patient presents with  . Back Pain    Onset-Steady getting worst for years, Chronic pain- Bulging Disc and affects her Lower Back and radiates down her thigh. Patient states when on her menstrual cycle they are worst. Patient states it feels like her back locks up at night and unable to sleep.  Patient states the Horizant is not helping her pain.   . Hip Pain    Right side, throbbing, stinging sensation that goes down into her hip joint. Patient states this pain has been constant and worst since having these 3 cycles back to back and has tried the foot insoles to help, PT, exercises at home.      HPI  DDD/herniated disc multilevel: she had a MRI lumbar spine last year, had PT and was stable, however over the past couple of weeks her back is constant, affecting her ability to work and has numbness on right lateral leg, sometimes down left leg also. She states it is also affecting her sleep. Feels stiff and hard to move. She states the pain is burning like, constant 10/10. No saddle anesthesia, no bowel or bladder incontinence.   Depression/Anxiety: they are having problems again with her adopted-daughter Caryl Pina again. She has been stealing their money at home, bought a phone, sexting and now her family is under investigation.   Patient Active Problem List   Diagnosis Date Noted  . Spondylolisthesis of lumbosacral region 02/11/2016  . Hematuria 04/09/2015  . Hyperglycemia 04/06/2015  . Right lumbar radiculitis 12/28/2014  . Anxiety and depression 12/27/2014  . Gastro-esophageal reflux disease without esophagitis 12/27/2014  . Bulge of lumbar disc without myelopathy 12/27/2014  . Dysmetabolic syndrome [id number]  . Migraine without aura and responsive to treatment 12/27/2014  . Osteoarthrosis 12/27/2014  . Obesity (BMI 30-39.9) 12/27/2014  .  Restless leg 12/27/2014  . Allergic rhinitis, seasonal 12/27/2014    Past Surgical History:  Procedure Laterality Date  . KNEE SURGERY Left 05/2011   Dr. Rudene Christians- Arthroscopic  . TONSILLECTOMY AND ADENOIDECTOMY    . URETHRAL STRICTURE DILATATION      Family History  Problem Relation Age of Onset  . Cancer Paternal Grandmother     Social History   Social History  . Marital status: Married    Spouse name: N/A  . Number of children: N/A  . Years of education: N/A   Occupational History  . Not on file.   Social History Main Topics  . Smoking status: Former Smoker    Packs/day: 0.75    Years: 2.00    Types: Cigarettes    Start date: 07/06/1981    Quit date: 07/07/1983  . Smokeless tobacco: Never Used  . Alcohol use No  . Drug use: No  . Sexual activity: Yes    Partners: Male   Other Topics Concern  . Not on file   Social History Narrative  . No narrative on file     Current Outpatient Prescriptions:  .  ALPRAZolam (XANAX) 0.5 MG tablet, Take 1 tablet (0.5 mg total) by mouth at bedtime as needed., Disp: 30 tablet, Rfl: 0 .  cloNIDine (CATAPRES) 0.1 MG tablet, TAKE 1 TABLET (0.1 MG TOTAL) BY MOUTH AT BEDTIME AS NEEDED., Disp: 30 tablet, Rfl: 2 .  EPINEPHrine 0.3 mg/0.3 mL IJ SOAJ injection, Inject 0.3 mLs (0.3  mg total) into the muscle once., Disp: 1 Device, Rfl: 1 .  loratadine (CLARITIN) 10 MG tablet, Take 1 tablet (10 mg total) by mouth daily., Disp: 30 tablet, Rfl: 5 .  omeprazole (PRILOSEC) 40 MG capsule, TAKE ONE CAPSULE BY MOUTH EVERY MORNING, Disp: 30 capsule, Rfl: 0 .  traZODone (DESYREL) 50 MG tablet, Take 0.5-1 tablets (25-50 mg total) by mouth at bedtime as needed for sleep., Disp: 30 tablet, Rfl: 2 .  triamcinolone (NASACORT ALLERGY 24HR) 55 MCG/ACT AERO nasal inhaler, Place 2 sprays into the nose at bedtime., Disp: 1 Inhaler, Rfl: 5 .  metaxalone (SKELAXIN) 800 MG tablet, Take 1 tablet (800 mg total) by mouth 3 (three) times daily., Disp: 90 tablet, Rfl: 0 .   predniSONE (STERAPRED UNI-PAK 48 TAB) 10 MG (48) TBPK tablet, Take by mouth daily., Disp: 42 tablet, Rfl: 0  Allergies  Allergen Reactions  . Cephalexin   . Ciprofloxacin Hives  . Clindamycin/Lincomycin Hives  . Erythromycin   . Nitrofurantoin Monohyd Macro   . Penicillins   . Sulfa Antibiotics      ROS  Ten systems reviewed and is negative except as mentioned in HPI   Objective  Vitals:   02/11/16 1525  BP: 122/68  Pulse: (!) 106  Resp: 18  Temp: 98.2 F (36.8 C)  TempSrc: Oral  SpO2: 98%  Weight: 214 lb 3.2 oz (97.2 kg)  Height: 5\' 5"  (1.651 m)    Body mass index is 35.64 kg/m.  Physical Exam  Constitutional: Patient appears well-developed and well-nourished. Obese No distress.  HEENT: head atraumatic, normocephalic, pupils equal and reactive to light, neck supple, throat within normal limits Cardiovascular: Normal rate, regular rhythm and normal heart sounds.  No murmur heard. No BLE edema. Pulmonary/Chest: Effort normal and breath sounds normal. No respiratory distress. Abdominal: Soft.  There is no tenderness. Psychiatric: Patient has a normal mood and affect. behavior is normal. Judgment and thought content normal. Muscular Skeletal: pain during palpation of lower back , worse on right lower back, positive  straight leg raise bilaterally   PHQ2/9: Depression screen [facility] 2/9 02/11/2016 09/30/2015 09/02/2015 04/29/2015 04/05/2015  Decreased Interest 0 0 0 0 0  Down, Depressed, Hopeless 0 0 0 0 1  PHQ - 2 Score 0 0 0 0 1     Fall Risk: Fall Risk  02/11/2016 09/30/2015 09/02/2015 04/29/2015 04/05/2015  Falls in the past year? No No No Yes Yes  Number falls in past yr: - - - 1 1  Injury with Fall? - - - No No  Follow up - - - - -     Functional Status Survey: Is the patient deaf or have difficulty hearing?: No Does the patient have difficulty seeing, even when wearing glasses/contacts?: No Does the patient have difficulty concentrating, remembering, or making  decisions?: No Does the patient have difficulty walking or climbing stairs?: No Does the patient have difficulty dressing or bathing?: No Does the patient have difficulty doing errands alone such as visiting a doctor's office or shopping?: No    Assessment & Plan  1. Right lumbar radiculitis  - metaxalone (SKELAXIN) 800 MG tablet; Take 1 tablet (800 mg total) by mouth 3 (three) times daily.  Dispense: 90 tablet; Refill: 0 - predniSONE (STERAPRED UNI-PAK 48 TAB) 10 MG (48) TBPK tablet; Take by mouth daily.  Dispense: 42 tablet; Refill: 0 - Ambulatory referral to Psyatrist  Clinic - Dr. Phyllis Ginger   2. Spondylolisthesis of lumbosacral region  - metaxalone (SKELAXIN) 800 MG  tablet; Take 1 tablet (800 mg total) by mouth 3 (three) times daily.  Dispense: 90 tablet; Refill: 0 - predniSONE (STERAPRED UNI-PAK 48 TAB) 10 MG (48) TBPK tablet; Take by mouth daily.  Dispense: 42 tablet; Refill: 0 - Ambulatory referral to Pain Clinic  4. Anxiety and depression  She is under a lot of stress, afraid that she will lose her job, also having family problems with adopted children.

## 2016-02-17 ENCOUNTER — Encounter: Payer: Self-pay | Admitting: Family Medicine

## 2016-02-17 ENCOUNTER — Ambulatory Visit (INDEPENDENT_AMBULATORY_CARE_PROVIDER_SITE_OTHER): Payer: 59 | Admitting: Family Medicine

## 2016-02-17 VITALS — BP 126/62 | HR 89 | Temp 98.1°F | Resp 16 | Ht 65.0 in | Wt 213.1 lb

## 2016-02-17 DIAGNOSIS — F331 Major depressive disorder, recurrent, moderate: Secondary | ICD-10-CM | POA: Diagnosis not present

## 2016-02-17 DIAGNOSIS — F411 Generalized anxiety disorder: Secondary | ICD-10-CM | POA: Diagnosis not present

## 2016-02-17 DIAGNOSIS — Z569 Unspecified problems related to employment: Secondary | ICD-10-CM

## 2016-02-17 DIAGNOSIS — G4700 Insomnia, unspecified: Secondary | ICD-10-CM | POA: Diagnosis not present

## 2016-02-17 MED ORDER — DULOXETINE HCL 30 MG PO CPEP: 30.0000 mg | ORAL_CAPSULE | Freq: Every day | ORAL | 0 refills | Status: DC

## 2016-02-17 MED ORDER — ALPRAZOLAM 0.5 MG PO TABS: 0.5000 mg | ORAL_TABLET | Freq: Two times a day (BID) | ORAL | 0 refills | Status: DC | PRN
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
## 2016-02-19 ENCOUNTER — Other Ambulatory Visit: Payer: Self-pay | Admitting: Family Medicine

## 2016-02-19 DIAGNOSIS — G4700 Insomnia, unspecified: Secondary | ICD-10-CM

## 2016-02-27 ENCOUNTER — Ambulatory Visit (INDEPENDENT_AMBULATORY_CARE_PROVIDER_SITE_OTHER): Payer: Managed Care, Other (non HMO) | Admitting: Family Medicine

## 2016-02-27 ENCOUNTER — Encounter: Payer: Self-pay | Admitting: Family Medicine

## 2016-02-27 VITALS — BP 118/64 | HR 111 | Temp 98.3°F | Resp 16 | Ht 65.0 in | Wt 208.4 lb

## 2016-02-27 DIAGNOSIS — G2581 Restless legs syndrome: Secondary | ICD-10-CM | POA: Diagnosis not present

## 2016-02-27 DIAGNOSIS — E8881 Metabolic syndrome: Secondary | ICD-10-CM

## 2016-02-27 DIAGNOSIS — G43009 Migraine without aura, not intractable, without status migrainosus: Secondary | ICD-10-CM | POA: Diagnosis not present

## 2016-02-27 DIAGNOSIS — G4700 Insomnia, unspecified: Secondary | ICD-10-CM | POA: Diagnosis not present

## 2016-02-27 DIAGNOSIS — Z13220 Encounter for screening for lipoid disorders: Secondary | ICD-10-CM | POA: Diagnosis not present

## 2016-02-27 DIAGNOSIS — K219 Gastro-esophageal reflux disease without esophagitis: Secondary | ICD-10-CM

## 2016-02-27 DIAGNOSIS — J302 Other seasonal allergic rhinitis: Secondary | ICD-10-CM | POA: Diagnosis not present

## 2016-02-27 DIAGNOSIS — R634 Abnormal weight loss: Secondary | ICD-10-CM | POA: Diagnosis not present

## 2016-02-27 DIAGNOSIS — F331 Major depressive disorder, recurrent, moderate: Secondary | ICD-10-CM

## 2016-02-27 DIAGNOSIS — F411 Generalized anxiety disorder: Secondary | ICD-10-CM

## 2016-02-27 DIAGNOSIS — M549 Dorsalgia, unspecified: Secondary | ICD-10-CM

## 2016-02-27 DIAGNOSIS — G8929 Other chronic pain: Secondary | ICD-10-CM | POA: Diagnosis not present

## 2016-02-27 MED ORDER — RANITIDINE HCL 150 MG PO TABS: 150.0000 mg | ORAL_TABLET | Freq: Every day | ORAL | 2 refills | Status: DC

## 2016-02-27 MED ORDER — OMEPRAZOLE 40 MG PO CPDR: 40.0000 mg | DELAYED_RELEASE_CAPSULE | Freq: Every morning | ORAL | 2 refills | Status: DC

## 2016-02-27 MED ORDER — DULOXETINE HCL 60 MG PO CPEP: 60.0000 mg | ORAL_CAPSULE | Freq: Every day | ORAL | 0 refills | Status: DC
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
## 2016-02-28 LAB — COMPLETE METABOLIC PANEL WITH GFR
ALT: 9 U/L (ref 6–29)
AST: 10 U/L (ref 10–35)
Albumin: 4.1 g/dL (ref 3.6–5.1)
Alkaline Phosphatase: 58 U/L (ref 33–130)
BUN: 8 mg/dL (ref 7–25)
CALCIUM: 9.1 mg/dL (ref 8.6–10.4)
CHLORIDE: 103 mmol/L (ref 98–110)
CO2: 28 mmol/L (ref 20–31)
Creat: 0.74 mg/dL (ref 0.50–1.05)
Glucose, Bld: 102 mg/dL — ABNORMAL HIGH (ref 65–99)
POTASSIUM: 4.5 mmol/L (ref 3.5–5.3)
Sodium: 140 mmol/L (ref 135–146)
Total Bilirubin: 1.2 mg/dL (ref 0.2–1.2)
Total Protein: 6.4 g/dL (ref 6.1–8.1)

## 2016-02-28 LAB — LIPID PANEL
Cholesterol: 159 mg/dL (ref 125–200)
HDL: 58 mg/dL (ref >=46)
LDL CALC: 74 mg/dL (ref <130)
Total CHOL/HDL Ratio: 2.7 Ratio (ref <=5.0)
Triglycerides: 137 mg/dL (ref <150)
VLDL: 27 mg/dL (ref <30)

## 2016-02-28 LAB — CBC WITH DIFFERENTIAL/PLATELET
BASOS PCT: 0 %
Basophils Absolute: 0 cells/uL (ref 0–200)
EOS PCT: 2 %
Eosinophils Absolute: 204 cells/uL (ref 15–500)
HCT: 44.2 % (ref 35.0–45.0)
Hemoglobin: 14.5 g/dL (ref 11.7–15.5)
LYMPHS PCT: 32 %
Lymphs Abs: 3264 cells/uL (ref 850–3900)
MCH: 29.4 pg (ref 27.0–33.0)
MCHC: 32.8 g/dL (ref 32.0–36.0)
MCV: 89.5 fL (ref 80.0–100.0)
MONOS PCT: 5 %
MPV: 10.4 fL (ref 7.5–12.5)
Monocytes Absolute: 510 cells/uL (ref 200–950)
Neutro Abs: 6222 cells/uL (ref 1500–7800)
Neutrophils Relative %: 61 %
PLATELETS: 301 10*3/uL (ref 140–400)
RBC: 4.94 MIL/uL (ref 3.80–5.10)
RDW: 13.3 % (ref 11.0–15.0)
WBC: 10.2 10*3/uL (ref 3.8–10.8)

## 2016-02-28 LAB — TSH: TSH: 2.8 m[IU]/L

## 2016-02-28 LAB — VITAMIN B12: VITAMIN B 12: 324 pg/mL (ref 200–1100)

## 2016-02-29 LAB — VITAMIN D 25 HYDROXY (VIT D DEFICIENCY, FRACTURES): VIT D 25 HYDROXY: 18 ng/mL — AB (ref 30–100)

## 2016-02-29 LAB — HEMOGLOBIN A1C
HEMOGLOBIN A1C: 5.5 % (ref <5.7)
Mean Plasma Glucose: 111 mg/dL

## 2016-03-02 ENCOUNTER — Other Ambulatory Visit: Payer: Self-pay | Admitting: Family Medicine

## 2016-03-02 MED ORDER — VITAMIN D (ERGOCALCIFEROL) 1.25 MG (50000 UNIT) PO CAPS
50000.0000 [IU] | ORAL_CAPSULE | ORAL | 0 refills | Status: DC
Start: 2016-03-02 — End: 2016-05-18

## 2016-03-02 MED ORDER — GABAPENTIN 100 MG PO CAPS: 100.0000 mg | ORAL_CAPSULE | Freq: Three times a day (TID) | ORAL | 3 refills | Status: DC

## 2016-03-03 ENCOUNTER — Telehealth: Payer: Self-pay

## 2016-03-03 NOTE — Telephone Encounter (Signed)
Patient states last night she started noticing she was having red dots and hotness on her face and around her neck. Also upset stomach and lack of appetite. Patient states the only new medications Dr. Ancil Boozer has started was the Cymbalta and Flexeril. Patient has her mother watching her and will take Benadryl for her reaction. Can you call her in something for her reaction? Patient has a follow up Sept 12 with Dr. Ancil Boozer. But do you want her to go to the walk in clinic at [place]?

## 2016-03-04 NOTE — Telephone Encounter (Signed)
Please contact patient.  I hope she has gone to Urgent care and is feeling better

## 2016-03-06 NOTE — Telephone Encounter (Signed)
Left message to return call 

## 2016-03-09 ENCOUNTER — Other Ambulatory Visit: Payer: Self-pay | Admitting: Family Medicine

## 2016-03-09 DIAGNOSIS — M5416 Radiculopathy, lumbar region: Secondary | ICD-10-CM

## 2016-03-09 DIAGNOSIS — M4317 Spondylolisthesis, lumbosacral region: Secondary | ICD-10-CM

## 2016-03-15 ENCOUNTER — Other Ambulatory Visit: Payer: Self-pay | Admitting: Family Medicine

## 2016-03-15 DIAGNOSIS — M5416 Radiculopathy, lumbar region: Secondary | ICD-10-CM

## 2016-03-15 DIAGNOSIS — M4317 Spondylolisthesis, lumbosacral region: Secondary | ICD-10-CM

## 2016-03-19 ENCOUNTER — Ambulatory Visit (INDEPENDENT_AMBULATORY_CARE_PROVIDER_SITE_OTHER): Payer: Managed Care, Other (non HMO) | Admitting: Family Medicine

## 2016-03-19 ENCOUNTER — Encounter: Payer: Self-pay | Admitting: Family Medicine

## 2016-03-19 VITALS — BP 116/62 | HR 114 | Temp 97.9°F | Resp 18 | Ht 65.0 in | Wt 205.8 lb

## 2016-03-19 DIAGNOSIS — Z23 Encounter for immunization: Secondary | ICD-10-CM | POA: Diagnosis not present

## 2016-03-19 DIAGNOSIS — F411 Generalized anxiety disorder: Secondary | ICD-10-CM

## 2016-03-19 DIAGNOSIS — G8929 Other chronic pain: Secondary | ICD-10-CM

## 2016-03-19 DIAGNOSIS — F331 Major depressive disorder, recurrent, moderate: Secondary | ICD-10-CM

## 2016-03-19 DIAGNOSIS — G4700 Insomnia, unspecified: Secondary | ICD-10-CM | POA: Diagnosis not present

## 2016-03-19 DIAGNOSIS — R634 Abnormal weight loss: Secondary | ICD-10-CM

## 2016-03-19 DIAGNOSIS — M549 Dorsalgia, unspecified: Secondary | ICD-10-CM

## 2016-03-19 MED ORDER — DULOXETINE HCL 60 MG PO CPEP: 60.0000 mg | ORAL_CAPSULE | Freq: Every day | ORAL | 1 refills | Status: DC

## 2016-03-19 MED ORDER — ALPRAZOLAM 0.5 MG PO TABS: 0.5000 mg | ORAL_TABLET | Freq: Two times a day (BID) | ORAL | 0 refills | Status: DC | PRN

## 2016-03-19 NOTE — Patient Instructions (Signed)
Discussed breathing exercises Deep breath count to 4 Hold for 4 Exhale for 4

## 2016-03-19 NOTE — Progress Notes (Signed)
Name: Kelly [name]   MRN: [id number]    DOB: 03/28/1966   Date:03/19/2016       Progress Note  Subjective  Chief Complaint  Chief Complaint  Patient presents with  . Follow-up    2 week F/U  . Depression    Double up on Cymbalta on last visit and has been seeing therapist but due to insurance has used all her visits.   . Insomnia    Increased Trazodone to 50 mg on last visit, patient has been doing well with this dose but states she will still wake up due to GERD and Back Pain.   . Gastroesophageal Reflux    Her acid reflux is acting up more at night and taking Zantac at night     HPI  GAD/Depression : taking Cymbalta 60 mg daily, trying to wean off Alprazolam 0.5 mg at most once a day, but feels anxious all the time. Still under a lot of stress. Seen by therapist and seems to be helpful. She has not been at work, but will try to have FMLA filled out to extend her time off. She is still having crying spells, lack of energy, problems focusing, she does not feel ready to go back to work, because her work is very stressful. She is falling asleep through the day and unable to stay asleep at night. Trying to get out of the house to be at friends house, so she can stay awake. She denies suicidal thoughts or ideation. She is still losing weight, no appetite.   Insomnia: taking Trazodone but states GERD has been worse at night and also napping during the day and has been unable to stay asleep. It helps when she sits up to sleep  GERD: she was skipping the morning dose and symptoms were getting worse, she has just re-started and is feeling better.   Chronic back pain: recently seen by Dr. Phyllis Ginger, and is getting higher dose of Gabapentin and will have steroid injection soon  Patient Active Problem List   Diagnosis Date Noted  . Spondylolisthesis of lumbosacral region 02/11/2016  . Hematuria 04/09/2015  . Hyperglycemia 04/06/2015  . Right lumbar radiculitis 12/28/2014  . Moderate  recurrent major depression ([place]) 12/27/2014  . Gastro-esophageal reflux disease without esophagitis 12/27/2014  . Bulge of lumbar disc without myelopathy 12/27/2014  . Dysmetabolic syndrome 09/17/9456  . Migraine without aura and responsive to treatment 12/27/2014  . Osteoarthrosis 12/27/2014  . Obesity (BMI 30-39.9) 12/27/2014  . Restless leg 12/27/2014  . Allergic rhinitis, seasonal 12/27/2014    Past Surgical History:  Procedure Laterality Date  . KNEE SURGERY Left 05/2011   Dr. Rudene Christians- Arthroscopic  . TONSILLECTOMY AND ADENOIDECTOMY    . URETHRAL STRICTURE DILATATION      Family History  Problem Relation Age of Onset  . Cancer Paternal Grandmother     Social History   Social History  . Marital status: Married    Spouse name: N/A  . Number of children: N/A  . Years of education: N/A   Occupational History  . Not on file.   Social History Main Topics  . Smoking status: Former Smoker    Packs/day: 0.75    Years: 2.00    Types: Cigarettes    Start date: 07/06/1981    Quit date: 07/07/1983  . Smokeless tobacco: Never Used  . Alcohol use No  . Drug use: No  . Sexual activity: Yes    Partners: Male   Other Topics Concern  .  Not on file   Social History Narrative  . No narrative on file     Current Outpatient Prescriptions:  .  ALPRAZolam (XANAX) 0.5 MG tablet, Take 1 tablet (0.5 mg total) by mouth 2 (two) times daily as needed., Disp: 40 tablet, Rfl: 0 .  cloNIDine (CATAPRES) 0.1 MG tablet, TAKE 1 TABLET (0.1 MG TOTAL) BY MOUTH AT BEDTIME AS NEEDED., Disp: 30 tablet, Rfl: 2 .  DULoxetine (CYMBALTA) 60 MG capsule, Take 1 capsule (60 mg total) by mouth daily., Disp: 30 capsule, Rfl: 0 .  EPINEPHrine 0.3 mg/0.3 mL IJ SOAJ injection, Inject 0.3 mLs (0.3 mg total) into the muscle once., Disp: 1 Device, Rfl: 1 .  gabapentin (NEURONTIN) 100 MG capsule, Take 1 capsule (100 mg total) by mouth 3 (three) times daily. Start with one qhs and titrate up as tolerated (Patient  taking differently: Take 100 mg by mouth 3 (three) times daily. Start with one qhs and titrate up as tolerated), Disp: 90 capsule, Rfl: 3 .  loratadine (CLARITIN) 10 MG tablet, Take 1 tablet (10 mg total) by mouth daily., Disp: 30 tablet, Rfl: 5 .  metaxalone (SKELAXIN) 800 MG tablet, TAKE 1 TABLET (800 MG TOTAL) BY MOUTH 3 (THREE) TIMES DAILY., Disp: 90 tablet, Rfl: 0 .  omeprazole (PRILOSEC) 40 MG capsule, Take 1 capsule (40 mg total) by mouth every morning., Disp: 30 capsule, Rfl: 2 .  pramipexole (MIRAPEX) 1.5 MG tablet, Take 1.5 mg by mouth daily., Disp: , Rfl:  .  ranitidine (ZANTAC) 150 MG tablet, Take 1 tablet (150 mg total) by mouth at bedtime., Disp: 30 tablet, Rfl: 2 .  traZODone (DESYREL) 50 MG tablet, TAKE 1/2 TO 1 TABLETS (25-50 MG TOTAL) BY MOUTH AT BEDTIME AS NEEDED FOR SLEEP., Disp: 30 tablet, Rfl: 2 .  triamcinolone (NASACORT ALLERGY 24HR) 55 MCG/ACT AERO nasal inhaler, Place 2 sprays into the nose at bedtime., Disp: 1 Inhaler, Rfl: 5 .  Vitamin D, Ergocalciferol, (DRISDOL) 50000 units CAPS capsule, Take 1 capsule (50,000 Units total) by mouth every 7 (seven) days., Disp: 12 capsule, Rfl: 0  Allergies  Allergen Reactions  . Cephalexin   . Ciprofloxacin Hives  . Clindamycin/Lincomycin Hives  . Erythromycin   . Nitrofurantoin Monohyd Macro   . Penicillins   . Sulfa Antibiotics      ROS  Ten systems reviewed and is negative except as mentioned in HPI   Objective  Vitals:   03/19/16 1050  BP: 116/62  Pulse: (!) 114  Resp: 18  Temp: 97.9 F (36.6 C)  TempSrc: Oral  SpO2: 97%  Weight: 205 lb 12.8 oz (93.4 kg)  Height: '5\' 5"'  (1.651 m)    Body mass index is 34.25 kg/m.  Physical Exam  Constitutional: Patient appears well-developed and well-nourished. Obese No distress.  HEENT: head atraumatic, normocephalic, pupils equal and reactive to light, neck supple, throat within normal limits Cardiovascular: Normal rate, regular rhythm and normal heart sounds.  No  murmur heard. No BLE edema. Pulmonary/Chest: Effort normal and breath sounds normal. No respiratory distress. Abdominal: Soft.  There is no tenderness. Psychiatric: Patient has a normal mood and affect. behavior is normal. Judgment and thought content normal. Muscular skeletal: pain during palpation of lumbar spine, negative straight leg raise  Recent Results (from the past 2160 hour(s))  Lipid panel     Status: None   Collection Time: 02/27/16  8:38 AM  Result Value Ref Range   Cholesterol 159 125 - 200 mg/dL   Triglycerides 137 <150 mg/dL  HDL 58 >=46 mg/dL   Total CHOL/HDL Ratio 2.7 <=5.0 Ratio   VLDL 27 <30 mg/dL   LDL Cholesterol 74 <130 mg/dL    Comment:   Total Cholesterol/HDL Ratio:CHD Risk                        Coronary Heart Disease Risk Table                                        Men       Women          1/2 Average Risk              3.4        3.3              Average Risk              5.0        4.4           2X Average Risk              9.6        7.1           3X Average Risk             23.4       11.0 Use the calculated Patient Ratio above and the CHD Risk table  to determine the patient's CHD Risk.   Hemoglobin A1c     Status: None   Collection Time: 02/27/16  8:38 AM  Result Value Ref Range   Hgb A1c MFr Bld 5.5 <5.7 %    Comment:   For the purpose of screening for the presence of diabetes:   <5.7%       Consistent with the absence of diabetes 5.7-6.4 %   Consistent with increased risk for diabetes (prediabetes) >=6.5 %     Consistent with diabetes   This assay result is consistent with a decreased risk of diabetes.   Currently, no consensus exists regarding use of hemoglobin A1c for diagnosis of diabetes in children.   According to American Diabetes Association (ADA) guidelines, hemoglobin A1c <7.0% represents optimal control in non-pregnant diabetic patients. Different metrics may apply to specific patient populations. Standards of Medical Care in  Diabetes (ADA).      Mean Plasma Glucose 111 mg/dL  COMPLETE METABOLIC PANEL WITH GFR     Status: Abnormal   Collection Time: 02/27/16  8:38 AM  Result Value Ref Range   Sodium 140 135 - 146 mmol/L   Potassium 4.5 3.5 - 5.3 mmol/L   Chloride 103 98 - 110 mmol/L   CO2 28 20 - 31 mmol/L   Glucose, Bld 102 (H) 65 - 99 mg/dL   BUN 8 7 - 25 mg/dL   Creat 0.74 0.50 - 1.05 mg/dL    Comment:   For patients > or = 51 years of age: The upper reference limit for Creatinine is approximately 13% higher for people identified as African-American.      Total Bilirubin 1.2 0.2 - 1.2 mg/dL   Alkaline Phosphatase 58 33 - 130 U/L   AST 10 10 - 35 U/L   ALT 9 6 - 29 U/L   Total Protein 6.4 6.1 - 8.1 g/dL   Albumin 4.1 3.6 - 5.1 g/dL   Calcium 9.1 8.6 - 10.4 mg/dL  GFR, Est African American >89 >=60 mL/min   GFR, Est Non African American >89 >=60 mL/min  VITAMIN D 25 Hydroxy (Vit-D Deficiency, Fractures)     Status: Abnormal   Collection Time: 02/27/16  8:38 AM  Result Value Ref Range   Vit D, 25-Hydroxy 18 (L) 30 - 100 ng/mL    Comment: Vitamin D Status           25-OH Vitamin D        Deficiency                <20 ng/mL        Insufficiency         20 - 29 ng/mL        Optimal             > or = 30 ng/mL   For 25-OH Vitamin D testing on patients on D2-supplementation and patients for whom quantitation of D2 and D3 fractions is required, the QuestAssureD 25-OH VIT D, (D2,D3), LC/MS/MS is recommended: order code [phone] (patients > 2 yrs).   Vitamin B12     Status: None   Collection Time: 02/27/16  8:38 AM  Result Value Ref Range   Vitamin B-12 324 200 - 1,100 pg/mL  TSH     Status: None   Collection Time: 02/27/16  8:38 AM  Result Value Ref Range   TSH 2.80 mIU/L    Comment:   Reference Range   > or = 20 Years  0.40-4.50   Pregnancy Range First trimester  0.26-2.66 Second trimester 0.55-2.73 Third trimester  0.43-2.91     CBC with Differential/Platelet     Status: None    Collection Time: 02/27/16  8:38 AM  Result Value Ref Range   WBC 10.2 3.8 - 10.8 K/uL   RBC 4.94 3.80 - 5.10 MIL/uL   Hemoglobin 14.5 11.7 - 15.5 g/dL   HCT 44.2 35.0 - 45.0 %   MCV 89.5 80.0 - 100.0 fL   MCH 29.4 27.0 - 33.0 pg   MCHC 32.8 32.0 - 36.0 g/dL   RDW 13.3 11.0 - 15.0 %   Platelets 301 140 - 400 K/uL   MPV 10.4 7.5 - 12.5 fL   Neutro Abs 6,222 1,500 - 7,800 cells/uL   Lymphs Abs 3,264 850 - 3,900 cells/uL   Monocytes Absolute 510 200 - 950 cells/uL   Eosinophils Absolute 204 15 - 500 cells/uL   Basophils Absolute 0 0 - 200 cells/uL   Neutrophils Relative % 61 %   Lymphocytes Relative 32 %   Monocytes Relative 5 %   Eosinophils Relative 2 %   Basophils Relative 0 %   Smear Review Criteria for review not met       PHQ2/9: Depression screen [facility] 2/9 02/27/2016 02/11/2016 09/30/2015 09/02/2015 04/29/2015  Decreased Interest 3 0 0 0 0  Down, Depressed, Hopeless 3 0 0 0 0  PHQ - 2 Score 6 0 0 0 0  Altered sleeping 3 - - - -  Tired, decreased energy 3 - - - -  Change in appetite 3 - - - -  Feeling bad or failure about yourself  3 - - - -  Trouble concentrating 2 - - - -  Moving slowly or fidgety/restless 2 - - - -  Suicidal thoughts 0 - - - -  PHQ-9 Score 22 - - - -  Difficult doing work/chores Extremely dIfficult - - - -     Fall Risk: Fall Risk  02/27/2016 02/11/2016 09/30/2015 09/02/2015 04/29/2015  Falls in the past year? Yes No No No Yes  Number falls in past yr: 1 - - - 1  Injury with Fall? No - - - No  Follow up - - - - -     Assessment & Plan  1. GAD (generalized anxiety disorder)  Discussed psychiatrist evaluation, her depression score is much higher - DULoxetine (CYMBALTA) 60 MG capsule; Take 1 capsule (60 mg total) by mouth daily.  Dispense: 30 capsule; Refill: 1 - ALPRAZolam (XANAX) 0.5 MG tablet; Take 1 tablet (0.5 mg total) by mouth 2 (two) times daily as needed.  Dispense: 30 tablet; Refill: 0  2. Needs flu shot  - Flu Vaccine QUAD 36+ mos PF IM  (Fluarix & Fluzone Quad PF)  3. Chronic back pain  Keep follow up with Dr. Phyllis Ginger  4. Insomnia, persistent  Discussed sleep hygiene  5. Weight loss  Lack of appetite   6. Moderate recurrent major depression (HCC)   - DULoxetine (CYMBALTA) 60 MG capsule; Take 1 capsule (60 mg total) by mouth daily.  Dispense: 30 capsule; Refill: 1 - Ambulatory referral to Psychiatry

## 2016-03-22 ENCOUNTER — Other Ambulatory Visit: Payer: Self-pay | Admitting: Family Medicine

## 2016-04-03 ENCOUNTER — Other Ambulatory Visit: Payer: Self-pay | Admitting: Family Medicine

## 2016-04-09 ENCOUNTER — Ambulatory Visit (INDEPENDENT_AMBULATORY_CARE_PROVIDER_SITE_OTHER): Payer: Managed Care, Other (non HMO) | Admitting: Psychiatry

## 2016-04-09 ENCOUNTER — Encounter: Payer: Self-pay | Admitting: Psychiatry

## 2016-04-09 VITALS — BP 137/78 | HR 77 | Temp 98.5°F | Wt 207.0 lb

## 2016-04-09 DIAGNOSIS — F411 Generalized anxiety disorder: Secondary | ICD-10-CM | POA: Diagnosis not present

## 2016-04-09 DIAGNOSIS — F331 Major depressive disorder, recurrent, moderate: Secondary | ICD-10-CM | POA: Diagnosis not present

## 2016-04-09 DIAGNOSIS — G4700 Insomnia, unspecified: Secondary | ICD-10-CM | POA: Diagnosis not present

## 2016-04-09 MED ORDER — TRAZODONE HCL 50 MG PO TABS: 50.0000 mg | ORAL_TABLET | Freq: Every day | ORAL | 1 refills | Status: DC

## 2016-04-09 MED ORDER — DULOXETINE HCL 40 MG PO CPEP: 80.0000 mg | ORAL_CAPSULE | Freq: Every day | ORAL | 1 refills | Status: DC

## 2016-04-09 NOTE — Progress Notes (Signed)
Psychiatric Initial Adult Assessment   Patient Identification: Kelly [name] MRN:  [id number] Date of Evaluation:  04/09/2016 Referral Source: Dr.Sowles Chief Complaint:  depression, anxiety Visit Diagnosis:    ICD-9-CM ICD-10-CM   1. MDD (major depressive disorder), recurrent episode, moderate (HCC) 296.32 F33.1   2. Moderate recurrent major depression (HCC) 296.32 F33.1 DULoxetine 40 MG CPEP  3. GAD (generalized anxiety disorder) 300.02 F41.1 DULoxetine 40 MG CPEP  4. Insomnia, persistent 307.42 G47.00     History of Present Illness:  Patient is a 50 year old Caucasian female who was referred by her primary care physician Dr. Duard Brady for an evaluation of her depression and anxiety. Patient today reports that she is been suffering from depression and anxiety for a long time. States that ever since she can remember she has always had anxiety and mood issues. However she was told in her home that she needs to be leaving her faith and down try to overcome her mood issues through the help of her faith. States that over the last few years her primary care physician started her on the Cymbalta and she is not sure if it has been helpful. She is married with 4 children and reports that they have been the target of her predatory loan and they have had some financial issues as well. Patient was working full-time as a [employer] manager but states that there have been several stressors at work and she is currently not working. Patient is constantly in tears and states that she needs to go on disability because she can't go back to work. Patient states that she has seen several counselors in the past and was always told that it is a problem and she dates to get over her issues. She denies any problems with drugs or alcohol. She denies any psychotic symptoms. She denies any suicidal thoughts. She has never been hospitalized psychiatrically and has never seen a psychiatrist.   Associated Signs/Symptoms: Depression  Symptoms:  depressed mood, insomnia, fatigue, feelings of worthlessness/guilt, hopelessness, anxiety, loss of energy/fatigue, weight loss, decreased appetite, (Hypo) Manic Symptoms:  denies Anxiety Symptoms:  Excessive Worry, Psychotic Symptoms:  denies PTSD Symptoms: denies  Past Psychiatric History: none, denies any suicidal thoughts  Previous Psychotropic Medications: Yes   Substance Abuse History in the last 12 months:  No.  Consequences of Substance Abuse: Negative  Past Medical History:  Past Medical History:  Diagnosis Date  . Allergy   . Depression   . GERD (gastroesophageal reflux disease)   . Insomnia   . Intermittent low back pain   . Migraines   . Osteoarthritis   . Recurrent UTI   . Restless leg syndrome   . Sciatica of right side   . Symptomatic menopausal or female climacteric states   . Vitamin D deficiency     Past Surgical History:  Procedure Laterality Date  . KNEE SURGERY Left 05/2011   Dr. Rudene Christians- Arthroscopic  . TONSILLECTOMY AND ADENOIDECTOMY    . URETHRAL STRICTURE DILATATION      Family Psychiatric History: on father`s side aunts and uncles, cousins committed suicide (51 of them)  Family History:  Family History  Problem Relation Age of Onset  . Cancer Paternal Grandmother     Social History:   Social History   Social History  . Marital status: Married    Spouse name: N/A  . Number of children: N/A  . Years of education: N/A   Social History Main Topics  . Smoking status: Former Smoker  Packs/day: 0.75    Years: 2.00    Types: Cigarettes    Start date: 07/06/1981    Quit date: 07/07/1983  . Smokeless tobacco: Never Used  . Alcohol use No  . Drug use: No  . Sexual activity: Yes    Partners: Male   Other Topics Concern  . Not on file   Social History Narrative  . No narrative on file    Additional Social History: Lives with husband and 2 daughters and her mom.  Allergies:   Allergies  Allergen Reactions  .  Cephalexin   . Ciprofloxacin Hives  . Clindamycin/Lincomycin Hives  . Erythromycin   . Nitrofurantoin Monohyd Macro   . Penicillins   . Sulfa Antibiotics     Metabolic Disorder Labs: Lab Results  Component Value Date   HGBA1C 5.5 02/27/2016   MPG 111 02/27/2016   No results found for: PROLACTIN Lab Results  Component Value Date   CHOL 159 02/27/2016   TRIG 137 02/27/2016   HDL 58 02/27/2016   CHOLHDL 2.7 02/27/2016   VLDL 27 02/27/2016   LDLCALC 74 02/27/2016   LDLCALC 78 07/14/2013     Current Medications: Current Outpatient Prescriptions  Medication Sig Dispense Refill  . ALPRAZolam (XANAX) 0.5 MG tablet Take 1 tablet (0.5 mg total) by mouth 2 (two) times daily as needed. 30 tablet 0  . cloNIDine (CATAPRES) 0.1 MG tablet TAKE 1 TABLET (0.1 MG TOTAL) BY MOUTH AT BEDTIME AS NEEDED. 30 tablet 2  . DULoxetine 40 MG CPEP Take 80 mg by mouth daily. 60 capsule 1  . EPINEPHrine 0.3 mg/0.3 mL IJ SOAJ injection Inject 0.3 mLs (0.3 mg total) into the muscle once. 1 Device 1  . gabapentin (NEURONTIN) 100 MG capsule Take 1 capsule (100 mg total) by mouth 3 (three) times daily. Start with one qhs and titrate up as tolerated (Patient taking differently: Take 100 mg by mouth 3 (three) times daily. Start with one qhs and titrate up as tolerated) 90 capsule 3  . loratadine (CLARITIN) 10 MG tablet Take 1 tablet (10 mg total) by mouth daily. 30 tablet 5  . metaxalone (SKELAXIN) 800 MG tablet TAKE 1 TABLET (800 MG TOTAL) BY MOUTH 3 (THREE) TIMES DAILY. 90 tablet 0  . omeprazole (PRILOSEC) 40 MG capsule Take 1 capsule (40 mg total) by mouth every morning. 30 capsule 2  . pramipexole (MIRAPEX) 1.5 MG tablet Take 1.5 mg by mouth daily.    . pramipexole (MIRAPEX) 1.5 MG tablet TAKE 1 TABLET BY MOUTH EVERY EVENING FOR RLS 30 tablet 5  . ranitidine (ZANTAC) 150 MG tablet Take 1 tablet (150 mg total) by mouth at bedtime. 30 tablet 2  . traZODone (DESYREL) 50 MG tablet Take 1 tablet (50 mg total) by  mouth at bedtime. 30 tablet 1  . triamcinolone (NASACORT ALLERGY 24HR) 55 MCG/ACT AERO nasal inhaler Place 2 sprays into the nose at bedtime. 1 Inhaler 5  . Vitamin D, Ergocalciferol, (DRISDOL) 50000 units CAPS capsule Take 1 capsule (50,000 Units total) by mouth every 7 (seven) days. 12 capsule 0   No current facility-administered medications for this visit.     Neurologic: Headache: Negative Seizure: No Paresthesias:No  Musculoskeletal: Strength & Muscle Tone: within normal limits Gait & Station: normal Patient leans: N/A  Psychiatric Specialty Exam: ROS  Last menstrual period 03/17/2016.There is no height or weight on file to calculate BMI.  General Appearance: Casual  Eye Contact:  Fair  Speech:  Clear and Coherent  Volume:  Normal  Mood:  Anxious, Depressed and Hopeless  Affect:  Constricted and Tearful  Thought Process:  Coherent  Orientation:  Full (Time, Place, and Person)  Thought Content:  WDL  Suicidal Thoughts:  No  Homicidal Thoughts:  No  Memory:  Immediate;   Fair Recent;   Fair Remote;   Fair  Judgement:  Fair  Insight:  Fair  Psychomotor Activity:  Normal  Concentration:  Concentration: Fair and Attention Span: Fair  Recall:  [employer] of Knowledge:Fair  Language: Fair  Akathisia:  No  Handed:  Right  AIMS (if indicated):  na  Assets:  Communication Skills Desire for Improvement Financial Resources/Insurance Housing Resilience Social Support  ADL's:  Intact  Cognition: WNL  Sleep:  fair    Treatment Plan Summary: Major depressive disorder recurrent  Increase Cymbalta to 80 mg once daily Patient recommended to start seeing a therapis   anxiety  same as above  Patient aware that this clinician does not prescribe Xanax and patient reports it's not a problem and she mostly does not take it.  Return to clinic in 2 weeks time or call before if necessary    Gudrun Axe, MD 10/5/20171:14 PM

## 2016-04-12 ENCOUNTER — Other Ambulatory Visit: Payer: Self-pay | Admitting: Family Medicine

## 2016-04-12 DIAGNOSIS — M5416 Radiculopathy, lumbar region: Secondary | ICD-10-CM

## 2016-04-12 DIAGNOSIS — M4317 Spondylolisthesis, lumbosacral region: Secondary | ICD-10-CM

## 2016-04-16 ENCOUNTER — Telehealth: Payer: Self-pay

## 2016-04-16 NOTE — Telephone Encounter (Signed)
received a request from the pharmacy stating that a alterinative prescription is needed to replace duloxetine hcl dr 40mg  that you prescribed.  this medication is not covered under the patient insurance.  what is covered is venlafaxine er.  please prescribe venlafaxine er.send in new rx.

## 2016-04-16 NOTE — Telephone Encounter (Signed)
per the pharmacy the insurance will not cover duloxetine the rx needs to say venafaxine er. not duloxetine.

## 2016-04-17 ENCOUNTER — Encounter: Payer: Self-pay | Admitting: Family Medicine

## 2016-04-17 ENCOUNTER — Ambulatory Visit (INDEPENDENT_AMBULATORY_CARE_PROVIDER_SITE_OTHER): Payer: Managed Care, Other (non HMO) | Admitting: Family Medicine

## 2016-04-17 VITALS — BP 112/68 | HR 100 | Temp 98.0°F | Resp 16 | Ht 65.0 in | Wt 214.2 lb

## 2016-04-17 DIAGNOSIS — F411 Generalized anxiety disorder: Secondary | ICD-10-CM | POA: Diagnosis not present

## 2016-04-17 DIAGNOSIS — R609 Edema, unspecified: Secondary | ICD-10-CM

## 2016-04-17 DIAGNOSIS — F331 Major depressive disorder, recurrent, moderate: Secondary | ICD-10-CM

## 2016-04-17 NOTE — Progress Notes (Signed)
Name: Kelly [name]   MRN: [id number]    DOB: [birth year]   Date:04/17/2016       Progress Note  Subjective  Chief Complaint  Chief Complaint  Patient presents with  . Anxiety    1 month follow up    HPI  GAD/Depression : she was taking Cymbalta 60 mg but was referred to Dr. Einar Grad and she gave rx of Cymbalta 40  Mg to take two daily but insurance denies coverage so she is currently without any Cymbalta for the past couple of weeks. Advised her to call Dr. Einar Grad to at least go back to 60 mg. She is still weaning  off Alprazolam 0.5 mg to stop it. Still under a lot of stress. She has not been at work. She is still having crying spells, lack of energy, problems focusing, she does not feel ready to go back to work, because her work is very stressful. She is going to court with [employer]aryl Pina because she stole money from her house, also still trying to refinance their home. She is terrified that they will not be able to qualify for a loan and it is making her very upset about the waiting period - having more panic attacks about it. Very afraid of getting disappointed. She feels like the depression got work because she was working on second floor and has very little sunlight exposure.   Fluid retention: she had steroid injection done by Dr. Phyllis Ginger and gained a lot of weight overnight. Explained that it is common with steroid injection   Patient Active Problem List   Diagnosis Date Noted  . Spondylolisthesis of lumbosacral region 02/11/2016  . Hematuria 04/09/2015  . Hyperglycemia 04/06/2015  . Right lumbar radiculitis 12/28/2014  . Moderate recurrent major depression ([place]) 12/27/2014  . Gastro-esophageal reflux disease without esophagitis 12/27/2014  . Bulge of lumbar disc without myelopathy 12/27/2014  . Dysmetabolic syndrome 15/17/6160  . Migraine without aura and responsive to treatment 12/27/2014  . Osteoarthrosis 12/27/2014  . Obesity (BMI 30-39.9) 12/27/2014  . Restless leg 12/27/2014  .  Allergic rhinitis, seasonal 12/27/2014    Past Surgical History:  Procedure Laterality Date  . KNEE SURGERY Left 05/2011   Dr. Rudene Christians- Arthroscopic  . TONSILLECTOMY AND ADENOIDECTOMY    . URETHRAL STRICTURE DILATATION      Family History  Problem Relation Age of Onset  . Cancer Paternal Grandmother     Social History   Social History  . Marital status: Married    Spouse name: N/A  . Number of children: N/A  . Years of education: N/A   Occupational History  . Not on file.   Social History Main Topics  . Smoking status: Former Smoker    Packs/day: 0.75    Years: 2.00    Types: Cigarettes    Start date: 07/06/1981    Quit date: 07/07/1983  . Smokeless tobacco: Never Used  . Alcohol use No  . Drug use: No  . Sexual activity: Yes    Partners: Male   Other Topics Concern  . Not on file   Social History Narrative  . No narrative on file     Current Outpatient Prescriptions:  .  ALPRAZolam (XANAX) 0.5 MG tablet, Take 1 tablet (0.5 mg total) by mouth 2 (two) times daily as needed., Disp: 30 tablet, Rfl: 0 .  cloNIDine (CATAPRES) 0.1 MG tablet, TAKE 1 TABLET (0.1 MG TOTAL) BY MOUTH AT BEDTIME AS NEEDED., Disp: 30 tablet, Rfl: 2 .  DULoxetine 40  MG CPEP, Take 80 mg by mouth daily., Disp: 60 capsule, Rfl: 1 .  EPINEPHrine 0.3 mg/0.3 mL IJ SOAJ injection, Inject 0.3 mLs (0.3 mg total) into the muscle once., Disp: 1 Device, Rfl: 1 .  gabapentin (NEURONTIN) 100 MG capsule, Take 1 capsule (100 mg total) by mouth 3 (three) times daily. Start with one qhs and titrate up as tolerated (Patient taking differently: Take 100 mg by mouth 3 (three) times daily. Start with one qhs and titrate up as tolerated), Disp: 90 capsule, Rfl: 3 .  loratadine (CLARITIN) 10 MG tablet, Take 1 tablet (10 mg total) by mouth daily., Disp: 30 tablet, Rfl: 5 .  metaxalone (SKELAXIN) 800 MG tablet, TAKE 1 TABLET (800 MG TOTAL) BY MOUTH 3 (THREE) TIMES DAILY., Disp: 90 tablet, Rfl: 0 .  omeprazole (PRILOSEC) 40  MG capsule, Take 1 capsule (40 mg total) by mouth every morning., Disp: 30 capsule, Rfl: 2 .  pramipexole (MIRAPEX) 1.5 MG tablet, TAKE 1 TABLET BY MOUTH EVERY EVENING FOR RLS, Disp: 30 tablet, Rfl: 5 .  ranitidine (ZANTAC) 150 MG tablet, Take 1 tablet (150 mg total) by mouth at bedtime., Disp: 30 tablet, Rfl: 2 .  traZODone (DESYREL) 50 MG tablet, Take 1 tablet (50 mg total) by mouth at bedtime., Disp: 30 tablet, Rfl: 1 .  triamcinolone (NASACORT ALLERGY 24HR) 55 MCG/ACT AERO nasal inhaler, Place 2 sprays into the nose at bedtime., Disp: 1 Inhaler, Rfl: 5 .  Vitamin D, Ergocalciferol, (DRISDOL) 50000 units CAPS capsule, Take 1 capsule (50,000 Units total) by mouth every 7 (seven) days., Disp: 12 capsule, Rfl: 0  Allergies  Allergen Reactions  . Cephalexin   . Ciprofloxacin Hives  . Clindamycin/Lincomycin Hives  . Erythromycin   . Nitrofurantoin Monohyd Macro   . Penicillins   . Sulfa Antibiotics      ROS  Ten systems reviewed and is negative except as mentioned in HPI   Objective  Vitals:   04/17/16 1054  BP: 112/68  Pulse: 100  Resp: 16  Temp: 98 F (36.7 C)  SpO2: 96%  Weight: 214 lb 4 oz (97.2 kg)  Height: '5\' 5"'  (1.651 m)    Body mass index is 35.65 kg/m.  Physical Exam  Constitutional: Patient appears well-developed and well-nourished. Obese   HEENT: head atraumatic, normocephalic, pupils equal and reactive to light, neck supple, throat within normal limits Cardiovascular: Normal rate, regular rhythm and normal heart sounds.  No murmur heard. No BLE edema. Pulmonary/Chest: Effort normal and breath sounds normal. No respiratory distress. Abdominal: Soft.  There is no tenderness. Psychiatric: Crying during the visit, normal speech pattern, thought content seems normal.   Recent Results (from the past 2160 hour(s))  Lipid panel     Status: None   Collection Time: 02/27/16  8:38 AM  Result Value Ref Range   Cholesterol 159 125 - 200 mg/dL   Triglycerides 137  <150 mg/dL   HDL 58 >=46 mg/dL   Total CHOL/HDL Ratio 2.7 <=5.0 Ratio   VLDL 27 <30 mg/dL   LDL Cholesterol 74 <130 mg/dL    Comment:   Total Cholesterol/HDL Ratio:CHD Risk                        Coronary Heart Disease Risk Table  Men       Women          1/2 Average Risk              3.4        3.3              Average Risk              5.0        4.4           2X Average Risk              9.6        7.1           3X Average Risk             23.4       11.0 Use the calculated Patient Ratio above and the CHD Risk table  to determine the patient's CHD Risk.   Hemoglobin A1c     Status: None   Collection Time: 02/27/16  8:38 AM  Result Value Ref Range   Hgb A1c MFr Bld 5.5 <5.7 %    Comment:   For the purpose of screening for the presence of diabetes:   <5.7%       Consistent with the absence of diabetes 5.7-6.4 %   Consistent with increased risk for diabetes (prediabetes) >=6.5 %     Consistent with diabetes   This assay result is consistent with a decreased risk of diabetes.   Currently, no consensus exists regarding use of hemoglobin A1c for diagnosis of diabetes in children.   According to American Diabetes Association (ADA) guidelines, hemoglobin A1c <7.0% represents optimal control in non-pregnant diabetic patients. Different metrics may apply to specific patient populations. Standards of Medical Care in Diabetes (ADA).      Mean Plasma Glucose 111 mg/dL  COMPLETE METABOLIC PANEL WITH GFR     Status: Abnormal   Collection Time: 02/27/16  8:38 AM  Result Value Ref Range   Sodium 140 135 - 146 mmol/L   Potassium 4.5 3.5 - 5.3 mmol/L   Chloride 103 98 - 110 mmol/L   CO2 28 20 - 31 mmol/L   Glucose, Bld 102 (H) 65 - 99 mg/dL   BUN 8 7 - 25 mg/dL   Creat 0.74 0.50 - 1.05 mg/dL    Comment:   For patients > or = 51 years of age: The upper reference limit for Creatinine is approximately 13% higher for people identified  as African-American.      Total Bilirubin 1.2 0.2 - 1.2 mg/dL   Alkaline Phosphatase 58 33 - 130 U/L   AST 10 10 - 35 U/L   ALT 9 6 - 29 U/L   Total Protein 6.4 6.1 - 8.1 g/dL   Albumin 4.1 3.6 - 5.1 g/dL   Calcium 9.1 8.6 - 10.4 mg/dL   GFR, Est African American >89 >=60 mL/min   GFR, Est Non African American >89 >=60 mL/min  VITAMIN D 25 Hydroxy (Vit-D Deficiency, Fractures)     Status: Abnormal   Collection Time: 02/27/16  8:38 AM  Result Value Ref Range   Vit D, 25-Hydroxy 18 (L) 30 - 100 ng/mL    Comment: Vitamin D Status           25-OH Vitamin D        Deficiency                <20 ng/mL  Insufficiency         20 - 29 ng/mL        Optimal             > or = 30 ng/mL   For 25-OH Vitamin D testing on patients on D2-supplementation and patients for whom quantitation of D2 and D3 fractions is required, the Quest[facility]D 25-OH VIT D, (D2,D3), LC/MS/MS is recommended: order code [id number] (patients > 2 yrs).   Vitamin B12     Status: None   Collection Time: 02/27/16  8:38 AM  Result Value Ref Range   Vitamin B-12 324 200 - 1,100 pg/mL  TSH     Status: None   Collection Time: 02/27/16  8:38 AM  Result Value Ref Range   TSH 2.80 mIU/L    Comment:   Reference Range   > or = 20 Years  0.40-4.50   Pregnancy Range First trimester  0.26-2.66 Second trimester 0.55-2.73 Third trimester  0.43-2.91     CBC with Differential/Platelet     Status: None   Collection Time: 02/27/16  8:38 AM  Result Value Ref Range   WBC 10.2 3.8 - 10.8 K/uL   RBC 4.94 3.80 - 5.10 MIL/uL   Hemoglobin 14.5 11.7 - 15.5 g/dL   HCT 44.2 35.0 - 45.0 %   MCV 89.5 80.0 - 100.0 fL   MCH 29.4 27.0 - 33.0 pg   MCHC 32.8 32.0 - 36.0 g/dL   RDW 13.3 11.0 - 15.0 %   Platelets 301 140 - 400 K/uL   MPV 10.4 7.5 - 12.5 fL   Neutro Abs 6,222 1,500 - 7,800 cells/uL   Lymphs Abs 3,264 850 - 3,900 cells/uL   Monocytes Absolute 510 200 - 950 cells/uL   Eosinophils Absolute 204 15 - 500 cells/uL   Basophils  Absolute 0 0 - 200 cells/uL   Neutrophils Relative % 61 %   Lymphocytes Relative 32 %   Monocytes Relative 5 %   Eosinophils Relative 2 %   Basophils Relative 0 %   Smear Review Criteria for review not met      PHQ2/9: Depression screen [facility] 2/9 04/17/2016 02/27/2016 02/11/2016 09/30/2015 09/02/2015  Decreased Interest 1 3 0 0 0  Down, Depressed, Hopeless 1 3 0 0 0  PHQ - 2 Score 2 6 0 0 0  Altered sleeping 0 3 - - -  Tired, decreased energy 1 3 - - -  Change in appetite - 3 - - -  Feeling bad or failure about yourself  0 3 - - -  Trouble concentrating 1 2 - - -  Moving slowly or fidgety/restless 0 2 - - -  Suicidal thoughts 0 0 - - -  PHQ-9 Score 4 22 - - -  Difficult doing work/chores Somewhat difficult Extremely dIfficult - - -     Fall Risk: Fall Risk  04/17/2016 02/27/2016 02/11/2016 09/30/2015 09/02/2015  Falls in the past year? Yes Yes No No No  Number falls in past yr: 1 1 - - -  Injury with Fall? - No - - -  Follow up Falls evaluation completed - - - -     Functional Status Survey: Is the patient deaf or have difficulty hearing?: No Does the patient have difficulty seeing, even when wearing glasses/contacts?: No Does the patient have difficulty concentrating, remembering, or making decisions?: No Does the patient have difficulty walking or climbing stairs?: Yes Does the patient have difficulty dressing or bathing?: No Does the patient  have difficulty doing errands alone such as visiting a doctor's office or shopping?: No    Assessment & Plan  1. GAD (generalized anxiety disorder)  Weaning off BZD, needs to resume Cymbalta.   2. Retention of fluid  Advised to avoid salt in her diet, drink water, elevated lower extremities  3. Moderate recurrent major depression (HCC)  Off Cymbalta, crying a lot again, still under stress. Seeing Dr. Einar Grad but having problems on getting Cymbalta filled

## 2016-04-20 ENCOUNTER — Other Ambulatory Visit: Payer: Self-pay | Admitting: Family Medicine

## 2016-04-21 ENCOUNTER — Other Ambulatory Visit (HOSPITAL_COMMUNITY): Payer: Self-pay | Admitting: Psychiatry

## 2016-04-21 DIAGNOSIS — F411 Generalized anxiety disorder: Secondary | ICD-10-CM

## 2016-04-21 MED ORDER — VENLAFAXINE HCL ER 37.5 MG PO CP24: 37.5000 mg | ORAL_CAPSULE | Freq: Every day | ORAL | 2 refills | Status: DC

## 2016-04-21 MED ORDER — ALPRAZOLAM 0.5 MG PO TABS: 0.5000 mg | ORAL_TABLET | Freq: Two times a day (BID) | ORAL | 0 refills | Status: DC | PRN

## 2016-04-21 NOTE — Telephone Encounter (Signed)
Venlafaxine ER at 37.5 mg once daily has been sent to her pharmacy

## 2016-04-23 ENCOUNTER — Encounter: Payer: Self-pay | Admitting: Psychiatry

## 2016-04-23 ENCOUNTER — Ambulatory Visit (INDEPENDENT_AMBULATORY_CARE_PROVIDER_SITE_OTHER): Payer: Managed Care, Other (non HMO) | Admitting: Psychiatry

## 2016-04-23 VITALS — BP 138/83 | HR 84 | Temp 98.7°F | Wt 211.0 lb

## 2016-04-23 DIAGNOSIS — F411 Generalized anxiety disorder: Secondary | ICD-10-CM | POA: Diagnosis not present

## 2016-04-23 DIAGNOSIS — F331 Major depressive disorder, recurrent, moderate: Secondary | ICD-10-CM | POA: Diagnosis not present

## 2016-04-23 NOTE — Progress Notes (Signed)
Psychiatric Progress note  Patient Identification: Kelly [name] MRN:  [id number] Date of Evaluation:  04/23/2016 Referral Source: Dr.Sowles Chief Complaint:   Chief Complaint    Follow-up; Medication Refill    depression, anxiety Visit Diagnosis:    ICD-9-CM ICD-10-CM   1. MDD (major depressive disorder), recurrent episode, moderate (HCC) 296.32 F33.1   2. GAD (generalized anxiety disorder) 300.02 F41.1     History of Present Illness:  Patient is a 50 year old Caucasian female who Is seen today for a follow-up off for depression and anxiety. Patient reports that she is feeling a little better because she has been doing a lot of thinking and seeing her therapist. States that she realizes she has had some severe anxiety since a long time and was not supported by her parents. Her anxiety was misunderstood as being shy and  being a spoiled child. States that she is also been able to talk more to her husband. In terms of medication the increased dose of Cymbalta was not approved by her insurance and she was prescribed Effexor 37.5 mg which she just started taking starting yesterday. She has tolerated this dose okay so far.  She denies any suicidal thoughts.  Past Psychiatric History: none, denies any suicidal thoughts  Previous Psychotropic Medications: Yes   Substance Abuse History in the last 12 months:  No.  Consequences of Substance Abuse: Negative  Past Medical History:  Past Medical History:  Diagnosis Date  . Allergy   . Anxiety   . Depression   . GERD (gastroesophageal reflux disease)   . Insomnia   . Intermittent low back pain   . Migraines   . Osteoarthritis   . Recurrent UTI   . Restless leg syndrome   . Sciatica of right side   . Symptomatic menopausal or female climacteric states   . Vitamin D deficiency     Past Surgical History:  Procedure Laterality Date  . KNEE SURGERY Left 05/2011   Dr. Rudene Christians- Arthroscopic  . TONSILLECTOMY AND ADENOIDECTOMY    .  URETHRAL STRICTURE DILATATION      Family Psychiatric History: on father`s side aunts and uncles, cousins committed suicide (48 of them)  Family History:  Family History  Problem Relation Age of Onset  . Cancer Paternal Grandmother     Social History:   Social History   Social History  . Marital status: Married    Spouse name: N/A  . Number of children: N/A  . Years of education: N/A   Social History Main Topics  . Smoking status: Former Smoker    Packs/day: 0.75    Years: 2.00    Types: Cigarettes    Start date: 07/06/1981    Quit date: 07/07/1983  . Smokeless tobacco: Never Used  . Alcohol use No  . Drug use: No  . Sexual activity: Yes    Partners: Male   Other Topics Concern  . None   Social History Narrative  . None    Additional Social History: Lives with husband and 2 daughters and her mom.  Allergies:   Allergies  Allergen Reactions  . Cephalexin   . Ciprofloxacin Hives  . Clindamycin/Lincomycin Hives  . Erythromycin   . Nitrofurantoin Monohyd Macro   . Penicillins   . Sulfa Antibiotics     Metabolic Disorder Labs: Lab Results  Component Value Date   HGBA1C 5.5 02/27/2016   MPG 111 02/27/2016   No results found for: PROLACTIN Lab Results  Component Value Date   CHOL  159 02/27/2016   TRIG 137 02/27/2016   HDL 58 02/27/2016   CHOLHDL 2.7 02/27/2016   VLDL 27 02/27/2016   LDLCALC 74 02/27/2016   LDLCALC 78 07/14/2013     Current Medications: Current Outpatient Prescriptions  Medication Sig Dispense Refill  . cloNIDine (CATAPRES) 0.1 MG tablet TAKE 1 TABLET (0.1 MG TOTAL) BY MOUTH AT BEDTIME AS NEEDED. 30 tablet 1  . EPINEPHrine 0.3 mg/0.3 mL IJ SOAJ injection Inject 0.3 mLs (0.3 mg total) into the muscle once. 1 Device 1  . gabapentin (NEURONTIN) 100 MG capsule Take 1 capsule (100 mg total) by mouth 3 (three) times daily. Start with one qhs and titrate up as tolerated (Patient taking differently: Take 100 mg by mouth 3 (three) times daily.  Start with one qhs and titrate up as tolerated) 90 capsule 3  . loratadine (CLARITIN) 10 MG tablet Take 1 tablet (10 mg total) by mouth daily. 30 tablet 5  . metaxalone (SKELAXIN) 800 MG tablet TAKE 1 TABLET (800 MG TOTAL) BY MOUTH 3 (THREE) TIMES DAILY. 90 tablet 0  . omeprazole (PRILOSEC) 40 MG capsule Take 1 capsule (40 mg total) by mouth every morning. 30 capsule 2  . pramipexole (MIRAPEX) 1.5 MG tablet TAKE 1 TABLET BY MOUTH EVERY EVENING FOR RLS 30 tablet 5  . ranitidine (ZANTAC) 150 MG tablet Take 1 tablet (150 mg total) by mouth at bedtime. 30 tablet 2  . traZODone (DESYREL) 50 MG tablet Take 1 tablet (50 mg total) by mouth at bedtime. 30 tablet 1  . triamcinolone (NASACORT ALLERGY 24HR) 55 MCG/ACT AERO nasal inhaler Place 2 sprays into the nose at bedtime. 1 Inhaler 5  . venlafaxine XR (EFFEXOR XR) 37.5 MG 24 hr capsule Take 1 capsule (37.5 mg total) by mouth daily. 30 capsule 2  . Vitamin D, Ergocalciferol, (DRISDOL) 50000 units CAPS capsule Take 1 capsule (50,000 Units total) by mouth every 7 (seven) days. 12 capsule 0   No current facility-administered medications for this visit.     Neurologic: Headache: Negative Seizure: No Paresthesias:No  Musculoskeletal: Strength & Muscle Tone: within normal limits Gait & Station: normal Patient leans: N/A  Psychiatric Specialty Exam: ROS  Blood pressure 138/83, pulse 84, temperature 98.7 F (37.1 C), temperature source Oral, weight 211 lb (95.7 kg), last menstrual period 04/07/2016.Body mass index is 35.11 kg/m.  General Appearance: Casual  Eye Contact:  Fair  Speech:  Clear and Coherent  Volume:  Normal  Mood:  Slightly better  Affect:  congruent  Thought Process:  Coherent  Orientation:  Full (Time, Place, and Person)  Thought Content:  WDL  Suicidal Thoughts:  No  Homicidal Thoughts:  No  Memory:  Immediate;   Fair Recent;   Fair Remote;   Fair  Judgement:  Fair  Insight:  Fair  Psychomotor Activity:  Normal   Concentration:  Concentration: Fair and Attention Span: Fair  Recall:  [employer] of Knowledge:Fair  Language: Fair  Akathisia:  No  Handed:  Right  AIMS (if indicated):  na  Assets:  Communication Skills Desire for Improvement Financial Resources/Insurance Housing Resilience Social Support  ADL's:  Intact  Cognition: WNL  Sleep:  fair    Treatment Plan Summary: Major depressive disorder recurrent  Continue Effexor XR 37.5mg  po qd. Continue to see therapist.   Anxiety Same as above  Patient aware that this clinician does not prescribe Xanax and patient reports it's not a problem and she mostly does not take it.  Return to clinic  in 2 weeks time or call before if necessary    Elvin So, MD 10/19/20179:32 AM

## 2016-05-07 ENCOUNTER — Ambulatory Visit (INDEPENDENT_AMBULATORY_CARE_PROVIDER_SITE_OTHER): Payer: Managed Care, Other (non HMO) | Admitting: Psychiatry

## 2016-05-07 ENCOUNTER — Encounter: Payer: Self-pay | Admitting: Psychiatry

## 2016-05-07 VITALS — BP 103/74 | HR 78 | Temp 98.0°F | Wt 213.6 lb

## 2016-05-07 DIAGNOSIS — F331 Major depressive disorder, recurrent, moderate: Secondary | ICD-10-CM

## 2016-05-07 DIAGNOSIS — F411 Generalized anxiety disorder: Secondary | ICD-10-CM | POA: Diagnosis not present

## 2016-05-07 MED ORDER — VENLAFAXINE HCL ER 75 MG PO CP24: 75.0000 mg | ORAL_CAPSULE | Freq: Every day | ORAL | 1 refills | Status: DC

## 2016-05-07 NOTE — Progress Notes (Signed)
Psychiatric Progress note  Patient Identification: CERIYAH NEARING MRN:  [id number] Date of Evaluation:  05/07/2016 Referral Source: Dr.Sowles Chief Complaint:   Chief Complaint    Follow-up; Medication Refill    depression, anxiety Visit Diagnosis:    ICD-9-CM ICD-10-CM   1. MDD (major depressive disorder), recurrent episode, moderate (HCC) 296.32 F33.1   2. GAD (generalized anxiety disorder) 300.02 F41.1     History of Present Illness:  Patient is a 50 year old Caucasian female who Is seen today for a follow-up off for depression and anxiety. Patient reports that she has been doing much better. She's been seeing her therapist and feels that she cannot return to her old job because of the physical activity involved. States that she does get some anxiety but down much better than before. Sleeping okay and eating okay. She is been getting some injections because of her back and the decrease in pain has improved her mood as well. She denies any suicidal thoughts.  Past Psychiatric History: none, denies any suicidal thoughts  Previous Psychotropic Medications: Yes   Substance Abuse History in the last 12 months:  No.  Consequences of Substance Abuse: Negative  Past Medical History:  Past Medical History:  Diagnosis Date  . Allergy   . Anxiety   . Depression   . GERD (gastroesophageal reflux disease)   . Insomnia   . Intermittent low back pain   . Migraines   . Osteoarthritis   . Recurrent UTI   . Restless leg syndrome   . Sciatica of right side   . Symptomatic menopausal or female climacteric states   . Vitamin D deficiency     Past Surgical History:  Procedure Laterality Date  . KNEE SURGERY Left 05/2011   Dr. Rudene Christians- Arthroscopic  . TONSILLECTOMY AND ADENOIDECTOMY    . URETHRAL STRICTURE DILATATION      Family Psychiatric History: on father`s side aunts and uncles, cousins committed suicide (60 of them)  Family History:  Family History  Problem Relation Age of  Onset  . Cancer Paternal Grandmother     Social History:   Social History   Social History  . Marital status: Married    Spouse name: N/A  . Number of children: N/A  . Years of education: N/A   Social History Main Topics  . Smoking status: Former Smoker    Packs/day: 0.75    Years: 2.00    Types: Cigarettes    Start date: 07/06/1981    Quit date: 07/07/1983  . Smokeless tobacco: Never Used  . Alcohol use No  . Drug use: No  . Sexual activity: Yes    Partners: Male   Other Topics Concern  . None   Social History Narrative  . None    Additional Social History: Lives with husband and 2 daughters and her mom.  Allergies:   Allergies  Allergen Reactions  . Cephalexin   . Ciprofloxacin Hives  . Clindamycin/Lincomycin Hives  . Erythromycin   . Nitrofurantoin Monohyd Macro   . Penicillins   . Sulfa Antibiotics     Metabolic Disorder Labs: Lab Results  Component Value Date   HGBA1C 5.5 02/27/2016   MPG 111 02/27/2016   No results found for: PROLACTIN Lab Results  Component Value Date   CHOL 159 02/27/2016   TRIG 137 02/27/2016   HDL 58 02/27/2016   CHOLHDL 2.7 02/27/2016   VLDL 27 02/27/2016   LDLCALC 74 02/27/2016   LDLCALC 78 07/14/2013     Current Medications:  Current Outpatient Prescriptions  Medication Sig Dispense Refill  . cloNIDine (CATAPRES) 0.1 MG tablet TAKE 1 TABLET (0.1 MG TOTAL) BY MOUTH AT BEDTIME AS NEEDED. 30 tablet 1  . EPINEPHrine 0.3 mg/0.3 mL IJ SOAJ injection Inject 0.3 mLs (0.3 mg total) into the muscle once. 1 Device 1  . gabapentin (NEURONTIN) 100 MG capsule Take 1 capsule (100 mg total) by mouth 3 (three) times daily. Start with one qhs and titrate up as tolerated (Patient taking differently: Take 100 mg by mouth 3 (three) times daily. Start with one qhs and titrate up as tolerated) 90 capsule 3  . loratadine (CLARITIN) 10 MG tablet Take 1 tablet (10 mg total) by mouth daily. 30 tablet 5  . metaxalone (SKELAXIN) 800 MG tablet TAKE  1 TABLET (800 MG TOTAL) BY MOUTH 3 (THREE) TIMES DAILY. 90 tablet 0  . omeprazole (PRILOSEC) 40 MG capsule Take 1 capsule (40 mg total) by mouth every morning. 30 capsule 2  . pramipexole (MIRAPEX) 1.5 MG tablet TAKE 1 TABLET BY MOUTH EVERY EVENING FOR RLS 30 tablet 5  . ranitidine (ZANTAC) 150 MG tablet Take 1 tablet (150 mg total) by mouth at bedtime. 30 tablet 2  . traZODone (DESYREL) 50 MG tablet Take 1 tablet (50 mg total) by mouth at bedtime. 30 tablet 1  . triamcinolone (NASACORT ALLERGY 24HR) 55 MCG/ACT AERO nasal inhaler Place 2 sprays into the nose at bedtime. 1 Inhaler 5  . venlafaxine XR (EFFEXOR-XR) 75 MG 24 hr capsule Take 1 capsule (75 mg total) by mouth daily. 30 capsule 1  . Vitamin D, Ergocalciferol, (DRISDOL) 50000 units CAPS capsule Take 1 capsule (50,000 Units total) by mouth every 7 (seven) days. 12 capsule 0   No current facility-administered medications for this visit.     Neurologic: Headache: Negative Seizure: No Paresthesias:No  Musculoskeletal: Strength & Muscle Tone: within normal limits Gait & Station: normal Patient leans: N/A  Psychiatric Specialty Exam: ROS  Blood pressure 103/74, pulse 78, temperature 98 F (36.7 C), temperature source Oral, weight 213 lb 9.6 oz (96.9 kg), last menstrual period 04/07/2016.Body mass index is 35.54 kg/m.  General Appearance: Casual  Eye Contact:  Fair  Speech:  Clear and Coherent  Volume:  Normal  Mood:  Slightly better  Affect:  congruent  Thought Process:  Coherent  Orientation:  Full (Time, Place, and Person)  Thought Content:  WDL  Suicidal Thoughts:  No  Homicidal Thoughts:  No  Memory:  Immediate;   Fair Recent;   Fair Remote;   Fair  Judgement:  Fair  Insight:  Fair  Psychomotor Activity:  Normal  Concentration:  Concentration: Fair and Attention Span: Fair  Recall:  [employer] of Knowledge:Fair  Language: Fair  Akathisia:  No  Handed:  Right  AIMS (if indicated):  na  Assets:  Communication  Skills Desire for Improvement Financial Resources/Insurance Housing Resilience Social Support  ADL's:  Intact  Cognition: WNL  Sleep:  fair    Treatment Plan Summary:  Major depressive disorder recurrent  Increase effexor to 75mg  po qd. Continue to see therapist.   Anxiety Same as above   Return to clinic in 4 weeks time or call before if necessary    Keyaria Lawson, MD 11/2/201711:53 AM

## 2016-05-11 ENCOUNTER — Telehealth: Payer: Self-pay

## 2016-05-11 ENCOUNTER — Other Ambulatory Visit: Payer: Self-pay | Admitting: Family Medicine

## 2016-05-11 DIAGNOSIS — M5416 Radiculopathy, lumbar region: Secondary | ICD-10-CM

## 2016-05-11 DIAGNOSIS — M4317 Spondylolisthesis, lumbosacral region: Secondary | ICD-10-CM

## 2016-05-11 NOTE — Telephone Encounter (Signed)
She's not my patient; routing to Dr. Ancil Boozer

## 2016-05-11 NOTE — Telephone Encounter (Signed)
Patient called thinks she may be having  Pre menopause.  States has not been having a period it just a brownish discharge. I asked any chance she could be pregnant she states no.  She has also had medicine changes.  Wants to discuss at next appt with you.

## 2016-05-18 ENCOUNTER — Other Ambulatory Visit: Payer: Self-pay | Admitting: Family Medicine

## 2016-06-17 ENCOUNTER — Ambulatory Visit (INDEPENDENT_AMBULATORY_CARE_PROVIDER_SITE_OTHER): Payer: Managed Care, Other (non HMO) | Admitting: Family Medicine

## 2016-06-17 ENCOUNTER — Encounter: Payer: Self-pay | Admitting: Family Medicine

## 2016-06-17 VITALS — BP 106/62 | HR 102 | Temp 98.6°F | Resp 18 | Ht 65.0 in | Wt 222.5 lb

## 2016-06-17 DIAGNOSIS — K219 Gastro-esophageal reflux disease without esophagitis: Secondary | ICD-10-CM

## 2016-06-17 DIAGNOSIS — F331 Major depressive disorder, recurrent, moderate: Secondary | ICD-10-CM | POA: Diagnosis not present

## 2016-06-17 DIAGNOSIS — G43009 Migraine without aura, not intractable, without status migrainosus: Secondary | ICD-10-CM

## 2016-06-17 DIAGNOSIS — G2581 Restless legs syndrome: Secondary | ICD-10-CM | POA: Diagnosis not present

## 2016-06-17 DIAGNOSIS — E8881 Metabolic syndrome: Secondary | ICD-10-CM

## 2016-06-17 DIAGNOSIS — F411 Generalized anxiety disorder: Secondary | ICD-10-CM

## 2016-06-17 DIAGNOSIS — Z23 Encounter for immunization: Secondary | ICD-10-CM | POA: Diagnosis not present

## 2016-06-17 DIAGNOSIS — Z2911 Encounter for prophylactic immunotherapy for respiratory syncytial virus (RSV): Secondary | ICD-10-CM

## 2016-06-17 DIAGNOSIS — G4700 Insomnia, unspecified: Secondary | ICD-10-CM | POA: Diagnosis not present

## 2016-06-17 DIAGNOSIS — R61 Generalized hyperhidrosis: Secondary | ICD-10-CM

## 2016-06-17 DIAGNOSIS — N924 Excessive bleeding in the premenopausal period: Secondary | ICD-10-CM

## 2016-06-17 DIAGNOSIS — E669 Obesity, unspecified: Secondary | ICD-10-CM | POA: Diagnosis not present

## 2016-06-17 DIAGNOSIS — M5416 Radiculopathy, lumbar region: Secondary | ICD-10-CM

## 2016-06-17 MED ORDER — CLONIDINE HCL 0.1 MG PO TABS: ORAL_TABLET | ORAL | 5 refills | Status: DC

## 2016-06-17 MED ORDER — OMEPRAZOLE 40 MG PO CPDR: 40.0000 mg | DELAYED_RELEASE_CAPSULE | Freq: Every morning | ORAL | 5 refills | Status: DC

## 2016-06-17 MED ORDER — GABAPENTIN 100 MG PO CAPS: 100.0000 mg | ORAL_CAPSULE | Freq: Three times a day (TID) | ORAL | 3 refills | Status: DC

## 2016-06-17 NOTE — Progress Notes (Signed)
Name: Kelly [name]   MRN: [id number]    DOB: [birth year]   Date:06/17/2016       Progress Note  Subjective  Chief Complaint  Chief Complaint  Patient presents with  . Medication Refill    2 month F/U, Patient husband has Shingles and wanted to discuss preventative. Patient is going back to school for coding.  . Anxiety    Goes and sees Dr. Einar Grad, and working on breathing techniques. Feels better since starting Cymbalta and still having some break downs.  . Gastroesophageal Reflux    Stable with medication daily  . Dysmenorrhea    Patient period started Sunday and has a heavy period with a lot of clots, the last couple of periods was spots of blood  . Insomnia    Taking 2 Trazodones at night to sleep but her back pain still wakes up her periodically and has a appt today with Dr. Mancel Parsons.    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 Standing at work caused foot problems, that have improved, but also had a recent episodes of radiculitis, seeing Dr. Sharlet Salina, on gabapentin, no longer able to work because of depression and back pain and will resign from her job likely tomorrow.   RLS: she is only on Mirapex , she states symptoms are controlled most of the time  Depression Major and anxiety: seeing Dr. Einar Grad, taking medication as prescribed, having therapy and having new goals for her life. Off BZD and PHQ9 is still high but is following recommendations.   Migraine Headaches: episodes are very seldom now, she has been sleeping better, and is taking Gabapentin. She only had one episode in the past month. Associated with nausea, photophobia and phonophobia. Pain is usually temporal and radiates to nuchal area.   Snoring: she states she wakes up feeling tired at times, snores loudly and at times she wakes up  gasping at night. She had sleep study and was negative for OSA, she has RLS  Hot Flashes: clonidine is helping with symptoms  GERD: taking Pantoprazole in am's and is doing a little better, nausea has improved, taking ranitidine only prn. Heartburn is occasional now.   Shingles Vaccine: her husband has shingles and she would like to have the vaccine today. She called her insurance and is covered  Patient Active Problem List   Diagnosis Date Noted  . Spondylolisthesis of lumbosacral region 02/11/2016  . Hematuria 04/09/2015  . Hyperglycemia 04/06/2015  . Right lumbar radiculitis 12/28/2014  . Moderate recurrent major depression ([place]) 12/27/2014  . Gastro-esophageal reflux disease without esophagitis 12/27/2014  . Bulge of lumbar disc without myelopathy 12/27/2014  . Dysmetabolic syndrome [id number]  . Migraine without aura and responsive to treatment 12/27/2014  . Osteoarthrosis 12/27/2014  . Obesity (BMI 30-39.9) 12/27/2014  . Restless leg 12/27/2014  . Allergic rhinitis, seasonal 12/27/2014    Past Surgical History:  Procedure Laterality Date  . KNEE SURGERY Left 05/2011   Dr. Rudene Christians- Arthroscopic  . TONSILLECTOMY AND ADENOIDECTOMY    . URETHRAL STRICTURE DILATATION      Family History  Problem Relation Age of Onset  . Cancer Paternal Grandmother     Social History   Social History  . Marital status: Married    Spouse name: N/A  . Number of children: N/A  .  Years of education: N/A   Occupational History  . Not on file.   Social History Main Topics  . Smoking status: Former Smoker    Packs/day: 0.75    Years: 2.00    Types: Cigarettes    Start date: 07/06/1981    Quit date: 07/07/1983  . Smokeless tobacco: Never Used  . Alcohol use No  . Drug use: No  . Sexual activity: Yes    Partners: Male   Other Topics Concern  . Not on file   Social History Narrative  . No narrative on file     Current Outpatient Prescriptions:  .  cloNIDine (CATAPRES) 0.1 MG  tablet, TAKE 1 TABLET (0.1 MG TOTAL) BY MOUTH AT BEDTIME AS NEEDED., Disp: 30 tablet, Rfl: 1 .  EPINEPHrine 0.3 mg/0.3 mL IJ SOAJ injection, Inject 0.3 mLs (0.3 mg total) into the muscle once., Disp: 1 Device, Rfl: 1 .  gabapentin (NEURONTIN) 100 MG capsule, Take 1 capsule (100 mg total) by mouth 3 (three) times daily. Start with one qhs and titrate up as tolerated (Patient taking differently: Take 100 mg by mouth 3 (three) times daily. Start with one qhs and titrate up as tolerated), Disp: 90 capsule, Rfl: 3 .  loratadine (CLARITIN) 10 MG tablet, Take 1 tablet (10 mg total) by mouth daily., Disp: 30 tablet, Rfl: 5 .  metaxalone (SKELAXIN) 800 MG tablet, TAKE 1 TABLET (800 MG TOTAL) BY MOUTH 3 (THREE) TIMES DAILY., Disp: 90 tablet, Rfl: 0 .  omeprazole (PRILOSEC) 40 MG capsule, Take 1 capsule (40 mg total) by mouth every morning., Disp: 30 capsule, Rfl: 2 .  pramipexole (MIRAPEX) 1.5 MG tablet, TAKE 1 TABLET BY MOUTH EVERY EVENING FOR RLS, Disp: 30 tablet, Rfl: 5 .  ranitidine (ZANTAC) 150 MG tablet, Take 1 tablet (150 mg total) by mouth at bedtime., Disp: 30 tablet, Rfl: 2 .  traZODone (DESYREL) 50 MG tablet, Take 1 tablet (50 mg total) by mouth at bedtime. (Patient taking differently: Take 50 mg by mouth 2 (two) times daily. ), Disp: 30 tablet, Rfl: 1 .  triamcinolone (NASACORT ALLERGY 24HR) 55 MCG/ACT AERO nasal inhaler, Place 2 sprays into the nose at bedtime., Disp: 1 Inhaler, Rfl: 5 .  venlafaxine XR (EFFEXOR-XR) 75 MG 24 hr capsule, Take 1 capsule (75 mg total) by mouth daily., Disp: 30 capsule, Rfl: 1 .  vitamin B-12 (CYANOCOBALAMIN) 1000 MCG tablet, Take 1,000 mcg by mouth daily., Disp: , Rfl:  .  Vitamin D, Ergocalciferol, (DRISDOL) 50000 units CAPS capsule, TAKE 1 CAPSULE (50,000 UNITS TOTAL) BY MOUTH EVERY 7 (SEVEN) DAYS., Disp: 12 capsule, Rfl: 0  Allergies  Allergen Reactions  . Cephalexin   . Ciprofloxacin Hives  . Clindamycin/Lincomycin Hives  . Erythromycin   . Nitrofurantoin  Monohyd Macro   . Penicillins   . Sulfa Antibiotics      ROS  Constitutional: Negative for fever, positive for  weight change.  Respiratory: Negative for cough and shortness of breath.   Cardiovascular: Negative for chest pain or palpitations.  Gastrointestinal: Negative for abdominal pain, no bowel changes.  Musculoskeletal: Negative for gait problem or joint swelling. Back pain, seeing Dr. Sharlet Salina Skin: Negative for rash.  Neurological: Negative for dizziness or headache.  No other specific complaints in a complete review of systems (except as listed in HPI above).  Objective  Vitals:   06/17/16 1124  BP: 106/62  Pulse: (!) 102  Resp: 18  Temp: 98.6 F (37 C)  TempSrc: Oral  SpO2: 96%  Weight: 222 lb 8 oz (100.9 kg)  Height: 5\' 5"  (1.651 m)    Body mass index is 37.03 kg/m.  Physical Exam  Constitutional: Patient appears well-developed and well-nourished. Obese No distress.  HEENT: head atraumatic, normocephalic, pupils equal and reactive to light,  neck supple, throat within normal limits Cardiovascular: Normal rate, regular rhythm and normal heart sounds.  No murmur heard. No BLE edema. Pulmonary/Chest: Effort normal and breath sounds normal. No respiratory distress. Abdominal: Soft.  There is no tenderness. Psychiatric: Patient has a normal mood and affect. behavior is normal. Judgment and thought content normal. Muscular Skeletal: pain during palpation of of lumbar spine  PHQ2/9: Depression screen [facility] 2/9 06/17/2016 04/17/2016 02/27/2016 02/11/2016 09/30/2015  Decreased Interest 2 1 3  0 0  Down, Depressed, Hopeless 1 1 3  0 0  PHQ - 2 Score 3 2 6  0 0  Altered sleeping 3 0 3 - -  Tired, decreased energy 3 1 3  - -  Change in appetite 3 - 3 - -  Feeling bad or failure about yourself  3 0 3 - -  Trouble concentrating 1 1 2  - -  Moving slowly or fidgety/restless 1 0 2 - -  Suicidal thoughts 0 0 0 - -  PHQ-9 Score 17 4 22  - -  Difficult doing work/chores Very  difficult Somewhat difficult Extremely dIfficult - -     Fall Risk: Fall Risk  06/17/2016 04/17/2016 02/27/2016 02/11/2016 09/30/2015  Falls in the past year? No Yes Yes No No  Number falls in past yr: - 1 1 - -  Injury with Fall? - - No - -  Follow up - Falls evaluation completed - - -     Functional Status Survey: Is the patient deaf or have difficulty hearing?: No Does the patient have difficulty seeing, even when wearing glasses/contacts?: No Does the patient have difficulty concentrating, remembering, or making decisions?: No Does the patient have difficulty walking or climbing stairs?: No Does the patient have difficulty dressing or bathing?: No Does the patient have difficulty doing errands alone such as visiting a doctor's office or shopping?: No    Assessment & Plan  1. Migraine without aura and responsive to treatment  Doing well at this time, gabapentin seems to be helping even though she takes it for her back pain  2. GAD (generalized anxiety disorder)  Seeing Dr. Einar Grad, getting therapy, off BZD  3. Insomnia, persistent  Sleeping with Trazodone  4. 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 Excited about it  However depression score is still high, she is stressed about her daughter's , she will need home intervention for Ashley  5. Dysmetabolic syndrome  Last [id number] was normal, gained weight since last visit, discussed life style modification  6. Restless leg  Stable on medication  7. Abnormal perimenopausal bleeding  Irregular and heavy at times, explained secondary to peri-menopause  8. Obesity (BMI 30-39.9)  Discussed with the patient the risk posed by an increased BMI. Discussed importance of portion control, calorie counting and at least 150 minutes of physical activity weekly. Avoid sweet beverages and drink more water. Eat at least 6 servings of fruit and  vegetables daily   9. Right lumbar radiculitis  - gabapentin (NEURONTIN) 100 MG capsule; Take 1-3 capsules (100-300 mg total) by mouth 3 (three) times daily. 100 mg in am, in pm and 300 mg before bed  Dispense: 90  capsule; Refill: 3  10. Gastro-esophageal reflux disease without esophagitis  Still having symptoms and unable to wean self off - omeprazole (PRILOSEC) 40 MG capsule; Take 1 capsule (40 mg total) by mouth every morning.  Dispense: 30 capsule; Refill: 5  11. Night sweats  - cloNIDine (CATAPRES) 0.1 MG tablet; TAKE 1 TABLET (0.1 MG TOTAL) BY MOUTH AT BEDTIME AS NEEDED.  Dispense: 30 tablet; Refill: 5  12. Need for shingles vaccine  - Varicella-zoster vaccine subcutaneous

## 2016-06-18 ENCOUNTER — Ambulatory Visit (INDEPENDENT_AMBULATORY_CARE_PROVIDER_SITE_OTHER): Payer: Managed Care, Other (non HMO) | Admitting: Psychiatry

## 2016-06-18 ENCOUNTER — Encounter: Payer: Self-pay | Admitting: Psychiatry

## 2016-06-18 VITALS — BP 116/76 | HR 69 | Temp 97.7°F | Wt 220.4 lb

## 2016-06-18 DIAGNOSIS — F411 Generalized anxiety disorder: Secondary | ICD-10-CM

## 2016-06-18 DIAGNOSIS — F331 Major depressive disorder, recurrent, moderate: Secondary | ICD-10-CM | POA: Diagnosis not present

## 2016-06-18 MED ORDER — VENLAFAXINE HCL ER 150 MG PO CP24: 150.0000 mg | ORAL_CAPSULE | Freq: Every day | ORAL | 1 refills | Status: DC

## 2016-06-18 MED ORDER — TRAZODONE HCL 150 MG PO TABS: 150.0000 mg | ORAL_TABLET | Freq: Every day | ORAL | 1 refills | Status: DC

## 2016-06-18 NOTE — Progress Notes (Signed)
Psychiatric Progress note  Patient Identification: Kelly [name] MRN:  [id number] Date of Evaluation:  06/18/2016 Referral Source: Dr.Sowles Chief Complaint:   Chief Complaint    Follow-up; Medication Refill    depression, anxiety Visit Diagnosis:    ICD-9-CM ICD-10-CM   1. MDD (major depressive disorder), recurrent episode, moderate (HCC) 296.32 F33.1   2. GAD (generalized anxiety disorder) 300.02 F41.1     History of Present Illness:  Patient is a 50 year old Caucasian female who Is seen today for a follow-up off for depression and anxiety. She reports improved mood on the increased dose of Effexor. She is sleeping better. She is continuing to see her other doctors and has been put on leave from work. States she is still not motivated to do things and not excited about christmas. 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  She has not been sleeping well. She denies any suicidal thoughts.  Past Psychiatric History: none, denies any suicidal thoughts  Previous Psychotropic Medications: Yes   Substance Abuse History in the last 12 months:  No.  Consequences of Substance Abuse: Negative  Past Medical History:  Past Medical History:  Diagnosis Date  . Allergy   . Anxiety   . Depression   . GERD (gastroesophageal reflux disease)   . Insomnia   . Intermittent low back pain   . Migraines   . Osteoarthritis   . Recurrent UTI   . Restless leg syndrome   . Sciatica of right side   . Symptomatic menopausal or female climacteric states   . Vitamin D deficiency     Past Surgical History:  Procedure Laterality Date  . KNEE SURGERY Left 05/2011   Dr. Rudene Christians- Arthroscopic  . TONSILLECTOMY AND ADENOIDECTOMY    . URETHRAL STRICTURE DILATATION      Family Psychiatric History: on father`s side aunts and uncles, cousins committed suicide (30 of them)  Family History:  Family History  Problem Relation Age of Onset  . Cancer Paternal Grandmother     Social  History:   Social History   Social History  . Marital status: Married    Spouse name: N/A  . Number of children: N/A  . Years of education: N/A   Social History Main Topics  . Smoking status: Former Smoker    Packs/day: 0.75    Years: 2.00    Types: Cigarettes    Start date: 07/06/1981    Quit date: 07/07/1983  . Smokeless tobacco: Never Used  . Alcohol use No  . Drug use: No  . Sexual activity: Yes    Partners: Male   Other Topics Concern  . None   Social History Narrative  . None    Additional Social History: Lives with husband and 2 daughters and her mom.  Allergies:   Allergies  Allergen Reactions  . Cephalexin   . Ciprofloxacin Hives  . Clindamycin/Lincomycin Hives  . Erythromycin   . Nitrofurantoin Monohyd Macro   . Penicillins   . Sulfa Antibiotics     Metabolic Disorder Labs: Lab Results  Component Value Date   HGBA1C 5.5 02/27/2016   MPG 111 02/27/2016   No results found for: PROLACTIN Lab Results  Component Value Date   CHOL 159 02/27/2016   TRIG 137 02/27/2016   HDL 58 02/27/2016   CHOLHDL 2.7 02/27/2016   VLDL 27 02/27/2016   LDLCALC 74 02/27/2016   LDLCALC 78 07/14/2013     Current Medications: Current Outpatient Prescriptions  Medication Sig Dispense  Refill  . cloNIDine (CATAPRES) 0.1 MG tablet TAKE 1 TABLET (0.1 MG TOTAL) BY MOUTH AT BEDTIME AS NEEDED. 30 tablet 5  . EPINEPHrine 0.3 mg/0.3 mL IJ SOAJ injection Inject 0.3 mLs (0.3 mg total) into the muscle once. 1 Device 1  . gabapentin (NEURONTIN) 100 MG capsule Take 1-3 capsules (100-300 mg total) by mouth 3 (three) times daily. 100 mg in am, in pm and 300 mg before bed 90 capsule 3  . loratadine (CLARITIN) 10 MG tablet Take 1 tablet (10 mg total) by mouth daily. 30 tablet 5  . metaxalone (SKELAXIN) 800 MG tablet TAKE 1 TABLET (800 MG TOTAL) BY MOUTH 3 (THREE) TIMES DAILY. 90 tablet 0  . omeprazole (PRILOSEC) 40 MG capsule Take 1 capsule (40 mg total) by mouth every morning. 30 capsule  5  . pramipexole (MIRAPEX) 1.5 MG tablet TAKE 1 TABLET BY MOUTH EVERY EVENING FOR RLS 30 tablet 5  . ranitidine (ZANTAC) 150 MG tablet Take 1 tablet (150 mg total) by mouth at bedtime. 30 tablet 2  . traZODone (DESYREL) 50 MG tablet Take 1 tablet (50 mg total) by mouth at bedtime. (Patient taking differently: Take 50 mg by mouth 2 (two) times daily. ) 30 tablet 1  . triamcinolone (NASACORT ALLERGY 24HR) 55 MCG/ACT AERO nasal inhaler Place 2 sprays into the nose at bedtime. 1 Inhaler 5  . venlafaxine XR (EFFEXOR-XR) 75 MG 24 hr capsule Take 1 capsule (75 mg total) by mouth daily. 30 capsule 1  . vitamin B-12 (CYANOCOBALAMIN) 1000 MCG tablet Take 1,000 mcg by mouth daily.    . Vitamin D, Ergocalciferol, (DRISDOL) 50000 units CAPS capsule TAKE 1 CAPSULE (50,000 UNITS TOTAL) BY MOUTH EVERY 7 (SEVEN) DAYS. 12 capsule 0   No current facility-administered medications for this visit.     Neurologic: Headache: Negative Seizure: No Paresthesias:No  Musculoskeletal: Strength & Muscle Tone: within normal limits Gait & Station: normal Patient leans: N/A  Psychiatric Specialty Exam: ROS  Blood pressure 116/76, pulse 69, temperature 97.7 F (36.5 C), temperature source Oral, weight 220 lb 6.4 oz (100 kg), last menstrual period 06/14/2016.Body mass index is 36.68 kg/m.  General Appearance: Casual  Eye Contact:  Fair  Speech:  Clear and Coherent  Volume:  Normal  Mood:  Slightly better but not enjoying life.  Affect:  congruent  Thought Process:  Coherent  Orientation:  Full (Time, Place, and Person)  Thought Content:  WDL  Suicidal Thoughts:  No  Homicidal Thoughts:  No  Memory:  Immediate;   Fair Recent;   Fair Remote;   Fair  Judgement:  Fair  Insight:  Fair  Psychomotor Activity:  Normal  Concentration:  Concentration: Fair and Attention Span: Fair  Recall:  [employer] of Knowledge:Fair  Language: Fair  Akathisia:  No  Handed:  Right  AIMS (if indicated):  na  Assets:   Communication Skills Desire for Improvement Financial Resources/Insurance Housing Resilience Social Support  ADL's:  Intact  Cognition: WNL  Sleep:  fair    Treatment Plan Summary:  Major depressive disorder recurrent  Increase effexor to 150mg  po qd. Continue to see therapist. Discussed strategies to decrease her stress.  Insomnia Increase trazodone to 150mg  po qd.   Anxiety Same as above   Return to clinic in 1 weeks time or call before if necessary    Elvin So, MD 12/14/201711:17 AM

## 2016-06-19 ENCOUNTER — Telehealth: Payer: Self-pay | Admitting: Family Medicine

## 2016-06-19 NOTE — Telephone Encounter (Signed)
Headache unlikely to be related to the injection. Local reactions are common and not harmful. She can take Tylenol

## 2016-06-19 NOTE — Telephone Encounter (Signed)
Patient received shingle vaccination on Wednesday. Patient developed a headache last night (Thursday night). She started having severe headache on the left side at her temple. Feeling nauseated on Thursday. Patient does have rash around the injection site also a knot had developed and has went down some. Please advise

## 2016-06-19 NOTE — Telephone Encounter (Signed)
Called and relayed the below message from Dr. Ancil Boozer.  Patient stated that she understood.

## 2016-06-19 NOTE — Telephone Encounter (Signed)
I did provide pt with information sheet for vaccination given, please advise.

## 2016-06-25 ENCOUNTER — Encounter: Payer: Self-pay | Admitting: Psychiatry

## 2016-06-25 ENCOUNTER — Ambulatory Visit (INDEPENDENT_AMBULATORY_CARE_PROVIDER_SITE_OTHER): Payer: Managed Care, Other (non HMO) | Admitting: Psychiatry

## 2016-06-25 VITALS — BP 118/77 | HR 109 | Temp 97.5°F | Wt 225.8 lb

## 2016-06-25 DIAGNOSIS — F411 Generalized anxiety disorder: Secondary | ICD-10-CM | POA: Diagnosis not present

## 2016-06-25 DIAGNOSIS — F331 Major depressive disorder, recurrent, moderate: Secondary | ICD-10-CM

## 2016-06-25 NOTE — Progress Notes (Signed)
Psychiatric Progress note  Patient Identification: Kelly [name] MRN:  [id number] Date of Evaluation:  06/25/2016 Referral Source: Dr.Sowles Chief Complaint:   Chief Complaint    Follow-up; Medication Refill    depression, anxiety Visit Diagnosis:    ICD-9-CM ICD-10-CM   1. MDD (major depressive disorder), recurrent episode, moderate (HCC) 296.32 F33.1   2. GAD (generalized anxiety disorder) 300.02 F41.1     History of Present Illness:  Patient is a 50 year old Caucasian female who Is seen today for a follow-up off for depression and anxiety. Patient reports doing better on the Effexor at the higher dosage. States she is happier. Continues to feel better, hampered by her back. Her cyst eruption did not go well today and she will be needing surgery. Looking forward to a trip to [place] in January. Denies any suicidal thoughts. Fair sleep and appetite.  Past Psychiatric History: none, denies any suicidal thoughts  Previous Psychotropic Medications: Yes   Substance Abuse History in the last 12 months:  No.  Consequences of Substance Abuse: Negative  Past Medical History:  Past Medical History:  Diagnosis Date  . Allergy   . Anxiety   . Depression   . GERD (gastroesophageal reflux disease)   . Insomnia   . Intermittent low back pain   . Migraines   . Osteoarthritis   . Recurrent UTI   . Restless leg syndrome   . Sciatica of right side   . Symptomatic menopausal or female climacteric states   . Vitamin D deficiency     Past Surgical History:  Procedure Laterality Date  . KNEE SURGERY Left 05/2011   Dr. Rudene Christians- Arthroscopic  . TONSILLECTOMY AND ADENOIDECTOMY    . URETHRAL STRICTURE DILATATION      Family Psychiatric History: on father`s side aunts and uncles, cousins committed suicide (53 of them)  Family History:  Family History  Problem Relation Age of Onset  . Cancer Paternal Grandmother     Social History:   Social History   Social History  . Marital  status: Married    Spouse name: N/A  . Number of children: N/A  . Years of education: N/A   Social History Main Topics  . Smoking status: Former Smoker    Packs/day: 0.75    Years: 2.00    Types: Cigarettes    Start date: 07/06/1981    Quit date: 07/07/1983  . Smokeless tobacco: Never Used  . Alcohol use No  . Drug use: No  . Sexual activity: Yes    Partners: Male   Other Topics Concern  . None   Social History Narrative  . None    Additional Social History: Lives with husband and 2 daughters and her mom.  Allergies:   Allergies  Allergen Reactions  . Cephalexin   . Ciprofloxacin Hives  . Clindamycin/Lincomycin Hives  . Erythromycin   . Nitrofurantoin Monohyd Macro   . Penicillins   . Sulfa Antibiotics     Metabolic Disorder Labs: Lab Results  Component Value Date   HGBA1C 5.5 02/27/2016   MPG 111 02/27/2016   No results found for: PROLACTIN Lab Results  Component Value Date   CHOL 159 02/27/2016   TRIG 137 02/27/2016   HDL 58 02/27/2016   CHOLHDL 2.7 02/27/2016   VLDL 27 02/27/2016   LDLCALC 74 02/27/2016   LDLCALC 78 07/14/2013     Current Medications: Current Outpatient Prescriptions  Medication Sig Dispense Refill  . cloNIDine (CATAPRES) 0.1 MG tablet TAKE 1 TABLET (0.1 MG  TOTAL) BY MOUTH AT BEDTIME AS NEEDED. 30 tablet 5  . EPINEPHrine 0.3 mg/0.3 mL IJ SOAJ injection Inject 0.3 mLs (0.3 mg total) into the muscle once. 1 Device 1  . gabapentin (NEURONTIN) 100 MG capsule Take 1-3 capsules (100-300 mg total) by mouth 3 (three) times daily. 100 mg in am, in pm and 300 mg before bed 90 capsule 3  . loratadine (CLARITIN) 10 MG tablet Take 1 tablet (10 mg total) by mouth daily. 30 tablet 5  . metaxalone (SKELAXIN) 800 MG tablet TAKE 1 TABLET (800 MG TOTAL) BY MOUTH 3 (THREE) TIMES DAILY. 90 tablet 0  . omeprazole (PRILOSEC) 40 MG capsule Take 1 capsule (40 mg total) by mouth every morning. 30 capsule 5  . pramipexole (MIRAPEX) 1.5 MG tablet TAKE 1 TABLET  BY MOUTH EVERY EVENING FOR RLS 30 tablet 5  . ranitidine (ZANTAC) 150 MG tablet Take 1 tablet (150 mg total) by mouth at bedtime. 30 tablet 2  . traZODone (DESYREL) 150 MG tablet Take 1 tablet (150 mg total) by mouth at bedtime. 30 tablet 1  . triamcinolone (NASACORT ALLERGY 24HR) 55 MCG/ACT AERO nasal inhaler Place 2 sprays into the nose at bedtime. 1 Inhaler 5  . venlafaxine XR (EFFEXOR-XR) 150 MG 24 hr capsule Take 1 capsule (150 mg total) by mouth daily. 30 capsule 1  . vitamin B-12 (CYANOCOBALAMIN) 1000 MCG tablet Take 1,000 mcg by mouth daily.    . Vitamin D, Ergocalciferol, (DRISDOL) 50000 units CAPS capsule TAKE 1 CAPSULE (50,000 UNITS TOTAL) BY MOUTH EVERY 7 (SEVEN) DAYS. 12 capsule 0   No current facility-administered medications for this visit.     Neurologic: Headache: Negative Seizure: No Paresthesias:No  Musculoskeletal: Strength & Muscle Tone: within normal limits Gait & Station: normal Patient leans: N/A  Psychiatric Specialty Exam: ROS  Blood pressure 118/77, pulse (!) 109, temperature 97.5 F (36.4 C), temperature source Oral, weight 225 lb 12.8 oz (102.4 kg), last menstrual period 06/14/2016.Body mass index is 37.58 kg/m.  General Appearance: Casual  Eye Contact:  Fair  Speech:  Clear and Coherent  Volume:  Normal  Mood:  improved  Affect:  congruent  Thought Process:  Coherent  Orientation:  Full (Time, Place, and Person)  Thought Content:  WDL  Suicidal Thoughts:  No  Homicidal Thoughts:  No  Memory:  Immediate;   Fair Recent;   Fair Remote;   Fair  Judgement:  Fair  Insight:  Fair  Psychomotor Activity:  Normal  Concentration:  Concentration: Fair and Attention Span: Fair  Recall:  [employer] of Knowledge:Fair  Language: Fair  Akathisia:  No  Handed:  Right  AIMS (if indicated):  na  Assets:  Communication Skills Desire for Improvement Financial Resources/Insurance Housing Resilience Social Support  ADL's:  Intact  Cognition: WNL  Sleep:   fair    Treatment Plan Summary:  Major depressive disorder recurrent  Continue effexor at 150mg  po qd. Continue to see therapist. Discussed strategies to decrease her stress.  Insomnia Continue trazodone to 150mg  po qd.   Anxiety Same as above   Return to clinic in 8 weeks time or call before if necessary    Jaidy Cottam, Blair Dolphin, MD 12/21/20171:39 PM

## 2016-06-26 ENCOUNTER — Other Ambulatory Visit: Payer: Self-pay | Admitting: Family Medicine

## 2016-06-26 DIAGNOSIS — K219 Gastro-esophageal reflux disease without esophagitis: Secondary | ICD-10-CM

## 2016-06-26 DIAGNOSIS — M4317 Spondylolisthesis, lumbosacral region: Secondary | ICD-10-CM

## 2016-06-26 DIAGNOSIS — M5416 Radiculopathy, lumbar region: Secondary | ICD-10-CM

## 2016-08-11 ENCOUNTER — Ambulatory Visit: Payer: Self-pay | Admitting: Family Medicine

## 2016-08-11 ENCOUNTER — Encounter: Payer: Self-pay | Admitting: *Deleted

## 2016-08-11 ENCOUNTER — Emergency Department
Admission: EM | Admit: 2016-08-11 | Discharge: 2016-08-11 | Disposition: A | Discharge status: Home / Self Care | Payer: 59 | Attending: Emergency Medicine | Admitting: Emergency Medicine

## 2016-08-11 DIAGNOSIS — Z87891 Personal history of nicotine dependence: Secondary | ICD-10-CM | POA: Diagnosis not present

## 2016-08-11 DIAGNOSIS — N390 Urinary tract infection, site not specified: Secondary | ICD-10-CM | POA: Diagnosis not present

## 2016-08-11 DIAGNOSIS — F410 Panic disorder [episodic paroxysmal anxiety] without agoraphobia: Secondary | ICD-10-CM | POA: Insufficient documentation

## 2016-08-11 DIAGNOSIS — R300 Dysuria: Secondary | ICD-10-CM | POA: Diagnosis present

## 2016-08-11 LAB — URINALYSIS, COMPLETE (UACMP) WITH MICROSCOPIC
Bilirubin Urine: NEGATIVE
Glucose, UA: NEGATIVE mg/dL
Ketones, ur: 5 mg/dL — AB
Nitrite: POSITIVE — AB
PROTEIN: 30 mg/dL — AB
Specific Gravity, Urine: 1.021 (ref 1.005–1.030)
pH: 5 (ref 5.0–8.0)

## 2016-08-11 MED ORDER — CEFDINIR 125 MG/5ML PO SUSR: 300.0000 mg | Freq: Once | ORAL | Status: DC

## 2016-08-11 MED ORDER — CEFDINIR 300 MG PO CAPS
300.0000 mg | ORAL_CAPSULE | Freq: Once | ORAL | Status: AC
  Administered 2016-08-11: 300 mg via ORAL
  Filled 2016-08-11: qty 1

## 2016-08-11 MED ORDER — CEFDINIR 300 MG PO CAPS: 300.0000 mg | ORAL_CAPSULE | Freq: Two times a day (BID) | ORAL | 0 refills | Status: DC

## 2016-08-11 NOTE — ED Triage Notes (Signed)
Pt to ED reporting possible UTI with cloudy malodorous urine. Pt reports dysuria and frequency as well. Pt reports having chills two days ago but denies fever at this time.   Pt also reporting left knee pain after twisting knee last night. Pt reports having a hx of knee pain that is now worse after re-injury.   Pt also checking in for panic attacks. Pt verbalized desire for help but denies SI/HI or hallucinations. Pt verbalized desire to avoid behavioral health and see if anxiety could be managed until her psychiatrist comes back to town next week. Pt recently had daughter and husband arrested last night and reports increased stress from this

## 2016-08-11 NOTE — Discharge Instructions (Signed)
Please seek medical attention for any high fevers, chest pain, shortness of breath, change in behavior, persistent vomiting, bloody stool or any other new or concerning symptoms.  

## 2016-08-11 NOTE — ED Provider Notes (Signed)
[facility] Emergency Department Provider Note ____________________________________________   I have reviewed the triage vital signs and the nursing notes.   HISTORY  Chief Complaint Panic Attack and Dysuria   History limited by: Not Limited   HPI Kelly [name] is a 51 y.o. female who presents to the emergency department today because of concerns for urinary tract infection as well as panic attack. Patient states that she's been having lower back and bilateral kidney pain. This started yesterday. Patient states she has been under a lot of stress recently. She has been having trouble with her children at home. She states she is not sure if the stress brought on the urinary tract infection of the urine tract infection has worsened distress. The patient states that she gets UTIs frequently. She has multiple antibiotic allergies also says she can take cefdinir. She denies any thoughts about wanting to harm herself.   Past Medical History:  Diagnosis Date  . Allergy   . Anxiety   . Depression   . GERD (gastroesophageal reflux disease)   . Insomnia   . Intermittent low back pain   . Migraines   . Osteoarthritis   . Recurrent UTI   . Restless leg syndrome   . Sciatica of right side   . Symptomatic menopausal or female climacteric states   . Vitamin D deficiency     Patient Active Problem List   Diagnosis Date Noted  . Spondylolisthesis of lumbosacral region 02/11/2016  . Hematuria 04/09/2015  . Hyperglycemia 04/06/2015  . Right lumbar radiculitis 12/28/2014  . Moderate recurrent major depression ([place]) 12/27/2014  . Gastro-esophageal reflux disease without esophagitis 12/27/2014  . Bulge of lumbar disc without myelopathy 12/27/2014  . Dysmetabolic syndrome [id number]  . Migraine without aura and responsive to treatment 12/27/2014  . Osteoarthrosis 12/27/2014  . Obesity (BMI 30-39.9) 12/27/2014  . Restless leg 12/27/2014  . Allergic rhinitis,  seasonal 12/27/2014    Past Surgical History:  Procedure Laterality Date  . KNEE SURGERY Left 05/2011   Dr. Rudene Christians- Arthroscopic  . TONSILLECTOMY AND ADENOIDECTOMY    . URETHRAL STRICTURE DILATATION      Prior to Admission medications   Medication Sig Start Date End Date Taking? Authorizing Provider  cloNIDine (CATAPRES) 0.1 MG tablet TAKE 1 TABLET (0.1 MG TOTAL) BY MOUTH AT BEDTIME AS NEEDED. 06/17/16   Steele Sizer, MD  EPINEPHrine 0.3 mg/0.3 mL IJ SOAJ injection Inject 0.3 mLs (0.3 mg total) into the muscle once. 09/26/15   Steele Sizer, MD  gabapentin (NEURONTIN) 100 MG capsule Take 1-3 capsules (100-300 mg total) by mouth 3 (three) times daily. 100 mg in am, in pm and 300 mg before bed 06/17/16   Steele Sizer, MD  loratadine (CLARITIN) 10 MG tablet Take 1 tablet (10 mg total) by mouth daily. 01/23/16   Steele Sizer, MD  metaxalone (SKELAXIN) 800 MG tablet TAKE 1 TABLET (800 MG TOTAL) BY MOUTH 3 (THREE) TIMES DAILY. 06/26/16   Steele Sizer, MD  omeprazole (PRILOSEC) 40 MG capsule Take 1 capsule (40 mg total) by mouth every morning. 06/17/16   Steele Sizer, MD  pramipexole (MIRAPEX) 1.5 MG tablet TAKE 1 TABLET BY MOUTH EVERY EVENING FOR RLS 04/03/16   Steele Sizer, MD  ranitidine (ZANTAC) 150 MG tablet TAKE 1 TABLET (150 MG TOTAL) BY MOUTH AT BEDTIME. 06/26/16   Steele Sizer, MD  traZODone (DESYREL) 150 MG tablet Take 1 tablet (150 mg total) by mouth at bedtime. 06/18/16   Elvin So, MD  triamcinolone (  NASACORT ALLERGY 24HR) 55 MCG/ACT AERO nasal inhaler Place 2 sprays into the nose at bedtime. 11/25/15   Steele Sizer, MD  venlafaxine XR (EFFEXOR-XR) 150 MG 24 hr capsule Take 1 capsule (150 mg total) by mouth daily. 06/18/16 06/18/17  Himabindu Ravi, MD  vitamin B-12 (CYANOCOBALAMIN) 1000 MCG tablet Take 1,000 mcg by mouth daily.    Historical Provider, MD  Vitamin D, Ergocalciferol, (DRISDOL) 50000 units CAPS capsule TAKE 1 CAPSULE (50,000 UNITS TOTAL) BY MOUTH EVERY 7  (SEVEN) DAYS. 05/18/16   Steele Sizer, MD    Allergies Cephalexin; Ciprofloxacin; Clindamycin/lincomycin; Erythromycin; Nitrofurantoin monohyd macro; Penicillins; and Sulfa antibiotics  Family History  Problem Relation Age of Onset  . Cancer Paternal Grandmother     Social History Social History  Substance Use Topics  . Smoking status: Former Smoker    Packs/day: 0.75    Years: 2.00    Types: Cigarettes    Start date: 07/06/1981    Quit date: 07/07/1983  . Smokeless tobacco: Never Used  . Alcohol use No    Review of Systems  Constitutional: Negative for fever. Cardiovascular: Negative for chest pain. Respiratory: Negative for shortness of breath. Gastrointestinal: Positive for lower abdominal pain. Genitourinary: Positive for dysuria. Musculoskeletal: Negative for back pain. Skin: Negative for rash. Neurological: Negative for headaches, focal weakness or numbness.  10-point ROS otherwise negative.  ____________________________________________   PHYSICAL EXAM:  VITAL SIGNS: ED Triage Vitals [08/11/16 1828]  Enc Vitals Group     BP 136/63     Pulse Rate 85     Resp 16     Temp 98 F (36.7 C)     Temp Source Oral     SpO2 100 %     Weight 228 lb (103.4 kg)     Height 5\' 5"  (1.651 m)     Head Circumference      Peak Flow      Pain Score 9   Constitutional: Alert and oriented.  Eyes: Conjunctivae are normal. Normal extraocular movements. ENT   Head: Normocephalic and atraumatic.   Nose: No congestion/rhinnorhea.   Mouth/Throat: Mucous membranes are moist.   Neck: No stridor. Hematological/Lymphatic/Immunilogical: No cervical lymphadenopathy. Cardiovascular: Normal rate, regular rhythm.  No murmurs, rubs, or gallops.  Respiratory: Normal respiratory effort without tachypnea nor retractions. Breath sounds are clear and equal bilaterally. No wheezes/rales/rhonchi. Gastrointestinal: Soft and non tender. No rebound. No guarding.  Genitourinary:  Deferred Musculoskeletal: Normal range of motion in all extremities. No lower extremity edema. Neurologic:  Normal speech and language. No gross focal neurologic deficits are appreciated.  Skin:  Skin is warm, dry and intact. No rash noted. Psychiatric: Depressed. Tearful. Denies SI  ____________________________________________    LABS (pertinent positives/negatives)  Labs Reviewed  URINALYSIS, COMPLETE (UACMP) WITH MICROSCOPIC - Abnormal; Notable for the following:       Result Value   Color, Urine YELLOW (*)    APPearance HAZY (*)    Hgb urine dipstick MODERATE (*)    Ketones, ur 5 (*)    Protein, ur 30 (*)    Nitrite POSITIVE (*)    Leukocytes, UA SMALL (*)    Bacteria, UA RARE (*)    Squamous Epithelial / LPF 0-5 (*)    All other components within normal limits     ____________________________________________   EKG  None  ____________________________________________    RADIOLOGY  None  ____________________________________________   PROCEDURES  Procedures  ____________________________________________   INITIAL IMPRESSION / ASSESSMENT AND PLAN / ED COURSE  Pertinent  labs & imaging results that were available during my care of the patient were reviewed by me and considered in my medical decision making (see chart for details).  She here with concerns for urinary tract infection and panic attacks. Patient's urine is consistent with UTI. Patient has multiple antibiotic allergies but says she can take cefdinir. Additionally in terms of the patient's panic attacks she did state she felt better by the time my evaluation. She denied any SI. I did offer to have her stay and speak to psychiatry the patient states that she would just as well go home and call her therapist in the morning. I think this is reasonable.  ____________________________________________   FINAL CLINICAL IMPRESSION(S) / ED DIAGNOSES  Final diagnoses:  Acute lower UTI     Note: This  dictation was prepared with Dragon dictation. Any transcriptional errors that result from this process are unintentional     Nance Pear, MD 08/11/16 2315

## 2016-08-13 ENCOUNTER — Other Ambulatory Visit: Payer: Self-pay | Admitting: Psychiatry

## 2016-08-21 ENCOUNTER — Ambulatory Visit: Payer: Self-pay | Admitting: Family Medicine

## 2016-08-26 ENCOUNTER — Encounter: Payer: Self-pay | Admitting: Psychiatry

## 2016-08-26 ENCOUNTER — Ambulatory Visit (INDEPENDENT_AMBULATORY_CARE_PROVIDER_SITE_OTHER): Payer: 59 | Admitting: Psychiatry

## 2016-08-26 VITALS — BP 117/75 | HR 75 | Temp 98.5°F | Wt 228.6 lb

## 2016-08-26 DIAGNOSIS — F331 Major depressive disorder, recurrent, moderate: Secondary | ICD-10-CM

## 2016-08-26 DIAGNOSIS — F411 Generalized anxiety disorder: Secondary | ICD-10-CM

## 2016-08-26 MED ORDER — HYDROXYZINE HCL 10 MG PO TABS: 10.0000 mg | ORAL_TABLET | Freq: Two times a day (BID) | ORAL | 0 refills | Status: DC | PRN

## 2016-08-26 MED ORDER — VENLAFAXINE HCL ER 150 MG PO CP24: 150.0000 mg | ORAL_CAPSULE | Freq: Every day | ORAL | 1 refills | Status: DC

## 2016-08-26 MED ORDER — TRAZODONE HCL 150 MG PO TABS: 150.0000 mg | ORAL_TABLET | Freq: Every day | ORAL | 1 refills | Status: DC
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
## 2016-09-07 ENCOUNTER — Other Ambulatory Visit: Payer: Self-pay | Admitting: Family Medicine

## 2016-09-09 ENCOUNTER — Ambulatory Visit
Admission: RE | Admit: 2016-09-09 | Discharge: 2016-09-09 | Disposition: A | Discharge status: Home / Self Care | Payer: 59 | Source: Ambulatory Visit | Attending: Neurosurgery | Admitting: Neurosurgery

## 2016-09-09 ENCOUNTER — Encounter
Admission: RE | Admit: 2016-09-09 | Discharge: 2016-09-09 | Disposition: A | Discharge status: Home / Self Care | Payer: 59 | Source: Ambulatory Visit | Attending: Neurosurgery | Admitting: Neurosurgery

## 2016-09-09 DIAGNOSIS — Z01818 Encounter for other preprocedural examination: Secondary | ICD-10-CM

## 2016-09-09 DIAGNOSIS — Z01810 Encounter for preprocedural cardiovascular examination: Secondary | ICD-10-CM | POA: Diagnosis not present

## 2016-09-09 DIAGNOSIS — Z01812 Encounter for preprocedural laboratory examination: Secondary | ICD-10-CM | POA: Diagnosis present

## 2016-09-09 LAB — TYPE AND SCREEN
ABO/RH(D): A POS
ANTIBODY SCREEN: NEGATIVE

## 2016-09-09 LAB — SURGICAL PCR SCREEN
MRSA, PCR: NEGATIVE
Staphylococcus aureus: NEGATIVE

## 2016-09-09 LAB — CBC
HEMATOCRIT: 40.6 % (ref 35.0–47.0)
HEMOGLOBIN: 13.6 g/dL (ref 12.0–16.0)
MCH: 29.8 pg (ref 26.0–34.0)
MCHC: 33.5 g/dL (ref 32.0–36.0)
MCV: 89 fL (ref 80.0–100.0)
Platelets: 302 10*3/uL (ref 150–440)
RBC: 4.56 MIL/uL (ref 3.80–5.20)
RDW: 13.1 % (ref 11.5–14.5)
WBC: 9 10*3/uL (ref 3.6–11.0)

## 2016-09-09 LAB — PROTIME-INR
INR: 0.89
Prothrombin Time: 12 seconds (ref 11.4–15.2)

## 2016-09-09 LAB — BASIC METABOLIC PANEL
ANION GAP: 8 (ref 5–15)
BUN: 8 mg/dL (ref 6–20)
CALCIUM: 9.2 mg/dL (ref 8.9–10.3)
CHLORIDE: 105 mmol/L (ref 101–111)
CO2: 28 mmol/L (ref 22–32)
CREATININE: 0.62 mg/dL (ref 0.44–1.00)
GFR calc Af Amer: >60 mL/min (ref >60)
GFR calc non Af Amer: >60 mL/min (ref >60)
Glucose, Bld: 99 mg/dL (ref 65–99)
Potassium: 4 mmol/L (ref 3.5–5.1)
Sodium: 141 mmol/L (ref 135–145)

## 2016-09-09 LAB — URINALYSIS, COMPLETE (UACMP) WITH MICROSCOPIC
BACTERIA UA: NONE SEEN
BILIRUBIN URINE: NEGATIVE
Glucose, UA: NEGATIVE mg/dL
Ketones, ur: NEGATIVE mg/dL
LEUKOCYTES UA: NEGATIVE
Nitrite: NEGATIVE
Protein, ur: NEGATIVE mg/dL
SPECIFIC GRAVITY, URINE: 1.013 (ref 1.005–1.030)
pH: 6 (ref 5.0–8.0)

## 2016-09-09 LAB — DIFFERENTIAL
Basophils Absolute: 0.1 10*3/uL (ref 0–0.1)
Basophils Relative: 1 %
EOS ABS: 0.2 10*3/uL (ref 0–0.7)
EOS PCT: 2 %
LYMPHS ABS: 2.3 10*3/uL (ref 1.0–3.6)
Lymphocytes Relative: 26 %
MONOS PCT: 6 %
Monocytes Absolute: 0.5 10*3/uL (ref 0.2–0.9)
Neutro Abs: 5.9 10*3/uL (ref 1.4–6.5)
Neutrophils Relative %: 65 %

## 2016-09-09 LAB — APTT: aPTT: 27 seconds (ref 24–36)

## 2016-09-09 NOTE — Patient Instructions (Signed)
Your procedure is scheduled on: 09/16/16 Report to [place]. 2ND FLOOR MEDICAL MALL ENTRANCE. To find out your arrival time please call [phone] between 1PM - 3PM on 09/15/16.  Remember: Instructions that are not followed completely may result in serious medical risk, up to and including death, or upon the discretion of your surgeon and anesthesiologist your surgery may need to be rescheduled.    __X__ 1. Do not eat food or drink liquids after midnight. No gum chewing or hard candies.     __X__ 2. No Alcohol for 24 hours before or after surgery.   ____ 3. Bring all medications with you on the day of surgery if instructed.    __X__ 4. Notify your doctor if there is any change in your medical condition     (cold, fever, infections).             ___x__5. No smoking within 24 hours of your surgery.     Do not wear jewelry, make-up, hairpins, clips or nail polish.  Do not wear lotions, powders, or perfumes.   Do not shave 48 hours prior to surgery. Men may shave face and neck.  Do not bring valuables to the hospital.    [facility] is not responsible for any belongings or valuables.               Contacts, dentures or bridgework may not be worn into surgery.  Leave your suitcase in the car. After surgery it may be brought to your room.  For patients admitted to the hospital, discharge time is determined by your                treatment team.   Patients discharged the day of surgery will not be allowed to drive home.   Please read over the following fact sheets that you were given:   Pain Booklet and MRSA Information   __x__ Take these medicines the morning of surgery with A SIP OF WATER:    1. ranitidine  2. loratadine  3. Hydroxyzine if needed  4.  5.  6.  ____ Fleet Enema (as directed)   __x__ Use CHG Soap as directed  ____ Use inhalers on the day of surgery  ____ Stop metformin 2 days prior to surgery    ____ Take 1/2 of usual insulin dose the night before surgery  and none on the morning of surgery.   ____ Stop Coumadin/Plavix/aspirin on   __X__ Stop Anti-inflammatories such as Advil, Aleve, Ibuprofen, Motrin, Naproxen, Naprosyn, Goodies,powder, or aspirin products.  OK to take Tylenol.   ____ Stop supplements until after surgery.    ____ Bring C-Pap to the hospital.

## 2016-09-15 MED ORDER — VANCOMYCIN HCL 10 G IV SOLR
1500.0000 mg | Freq: Once | INTRAVENOUS | Status: AC
  Administered 2016-09-16: 1500 mg via INTRAVENOUS
  Filled 2016-09-15: qty 1500

## 2016-09-16 ENCOUNTER — Encounter: Payer: Self-pay | Admitting: *Deleted

## 2016-09-16 ENCOUNTER — Ambulatory Visit: Payer: 59 | Admitting: Registered Nurse

## 2016-09-16 ENCOUNTER — Ambulatory Visit: Payer: 59

## 2016-09-16 ENCOUNTER — Ambulatory Visit
Admission: RE | Admit: 2016-09-16 | Discharge: 2016-09-16 | Disposition: A | Discharge status: Home / Self Care | Payer: 59 | Source: Ambulatory Visit | Attending: Neurosurgery | Admitting: Neurosurgery

## 2016-09-16 ENCOUNTER — Encounter
Admission: RE | Disposition: A | Discharge status: Home / Self Care | Payer: Self-pay | Source: Ambulatory Visit | Attending: Neurosurgery

## 2016-09-16 DIAGNOSIS — Z87891 Personal history of nicotine dependence: Secondary | ICD-10-CM | POA: Insufficient documentation

## 2016-09-16 DIAGNOSIS — Z79899 Other long term (current) drug therapy: Secondary | ICD-10-CM | POA: Diagnosis not present

## 2016-09-16 DIAGNOSIS — K219 Gastro-esophageal reflux disease without esophagitis: Secondary | ICD-10-CM | POA: Diagnosis not present

## 2016-09-16 DIAGNOSIS — M4726 Other spondylosis with radiculopathy, lumbar region: Secondary | ICD-10-CM | POA: Insufficient documentation

## 2016-09-16 DIAGNOSIS — M48062 Spinal stenosis, lumbar region with neurogenic claudication: Secondary | ICD-10-CM | POA: Diagnosis not present

## 2016-09-16 DIAGNOSIS — Z882 Allergy status to sulfonamides status: Secondary | ICD-10-CM | POA: Diagnosis not present

## 2016-09-16 DIAGNOSIS — Z881 Allergy status to other antibiotic agents status: Secondary | ICD-10-CM | POA: Diagnosis not present

## 2016-09-16 DIAGNOSIS — J309 Allergic rhinitis, unspecified: Secondary | ICD-10-CM | POA: Diagnosis not present

## 2016-09-16 DIAGNOSIS — F418 Other specified anxiety disorders: Secondary | ICD-10-CM | POA: Insufficient documentation

## 2016-09-16 DIAGNOSIS — Z880 Allergy status to penicillin: Secondary | ICD-10-CM | POA: Diagnosis not present

## 2016-09-16 DIAGNOSIS — Z419 Encounter for procedure for purposes other than remedying health state, unspecified: Secondary | ICD-10-CM

## 2016-09-16 HISTORY — PX: LUMBAR LAMINECTOMY/DECOMPRESSION MICRODISCECTOMY: SHX5026

## 2016-09-16 LAB — POCT PREGNANCY, URINE: PREG TEST UR: NEGATIVE

## 2016-09-16 LAB — ABO/RH: ABO/RH(D): A POS

## 2016-09-16 SURGERY — LUMBAR LAMINECTOMY/DECOMPRESSION MICRODISCECTOMY 1 LEVEL
Anesthesia: General | Site: Spine Lumbar | Wound class: Clean

## 2016-09-16 MED ORDER — MIDAZOLAM HCL 2 MG/2ML IJ SOLN
INTRAMUSCULAR | Status: DC | PRN
  Administered 2016-09-16: 2 mg via INTRAVENOUS

## 2016-09-16 MED ORDER — SUGAMMADEX SODIUM 200 MG/2ML IV SOLN
INTRAVENOUS | Status: DC | PRN
  Administered 2016-09-16: 200 mg via INTRAVENOUS

## 2016-09-16 MED ORDER — SUCCINYLCHOLINE CHLORIDE 20 MG/ML IJ SOLN
INTRAMUSCULAR | Status: AC
  Filled 2016-09-16: qty 1

## 2016-09-16 MED ORDER — ONDANSETRON HCL 4 MG/2ML IJ SOLN
INTRAMUSCULAR | Status: DC | PRN
  Administered 2016-09-16: 4 mg via INTRAVENOUS

## 2016-09-16 MED ORDER — FENTANYL CITRATE (PF) 100 MCG/2ML IJ SOLN
INTRAMUSCULAR | Status: DC | PRN
  Administered 2016-09-16 (×3): 50 ug via INTRAVENOUS
  Administered 2016-09-16: 100 ug via INTRAVENOUS
  Administered 2016-09-16: 50 ug via INTRAVENOUS

## 2016-09-16 MED ORDER — BACITRACIN 50000 UNITS IM SOLR
INTRAMUSCULAR | Status: AC
  Filled 2016-09-16: qty 1

## 2016-09-16 MED ORDER — LIDOCAINE HCL (PF) 2 % IJ SOLN
INTRAMUSCULAR | Status: AC
  Filled 2016-09-16: qty 2

## 2016-09-16 MED ORDER — ACETAMINOPHEN 10 MG/ML IV SOLN
INTRAVENOUS | Status: AC
  Filled 2016-09-16: qty 100

## 2016-09-16 MED ORDER — DIPHENHYDRAMINE HCL 50 MG/ML IJ SOLN
INTRAMUSCULAR | Status: DC | PRN
  Administered 2016-09-16: 25 mg via INTRAVENOUS

## 2016-09-16 MED ORDER — ONDANSETRON HCL 4 MG/2ML IJ SOLN
INTRAMUSCULAR | Status: AC
  Filled 2016-09-16: qty 2

## 2016-09-16 MED ORDER — FENTANYL CITRATE (PF) 100 MCG/2ML IJ SOLN
INTRAMUSCULAR | Status: AC
  Filled 2016-09-16: qty 2

## 2016-09-16 MED ORDER — METHYLPREDNISOLONE ACETATE 40 MG/ML IJ SUSP
INTRAMUSCULAR | Status: DC | PRN
  Administered 2016-09-16: 40 mg

## 2016-09-16 MED ORDER — MIDAZOLAM HCL 2 MG/2ML IJ SOLN
INTRAMUSCULAR | Status: AC
  Filled 2016-09-16: qty 2

## 2016-09-16 MED ORDER — PROPOFOL 10 MG/ML IV BOLUS
INTRAVENOUS | Status: DC | PRN
  Administered 2016-09-16: 180 mg via INTRAVENOUS

## 2016-09-16 MED ORDER — FENTANYL CITRATE (PF) 100 MCG/2ML IJ SOLN
25.0000 ug | INTRAMUSCULAR | Status: DC | PRN
  Administered 2016-09-16 (×4): 25 ug via INTRAVENOUS

## 2016-09-16 MED ORDER — METAXALONE 800 MG PO TABS: 800.0000 mg | ORAL_TABLET | Freq: Three times a day (TID) | ORAL | 0 refills | Status: DC

## 2016-09-16 MED ORDER — ACETAMINOPHEN 10 MG/ML IV SOLN
INTRAVENOUS | Status: DC | PRN
  Administered 2016-09-16: 1000 mg via INTRAVENOUS

## 2016-09-16 MED ORDER — BUPIVACAINE LIPOSOME 1.3 % IJ SUSP
INTRAMUSCULAR | Status: DC | PRN
  Administered 2016-09-16: 20 mL

## 2016-09-16 MED ORDER — OXYCODONE-ACETAMINOPHEN 5-325 MG PO TABS: 1.0000 | ORAL_TABLET | Freq: Four times a day (QID) | ORAL | 0 refills | Status: DC | PRN

## 2016-09-16 MED ORDER — LACTATED RINGERS IV SOLN
INTRAVENOUS | Status: DC
  Administered 2016-09-16: 06:00:00 via INTRAVENOUS

## 2016-09-16 MED ORDER — GELATIN ABSORBABLE 12-7 MM EX MISC
CUTANEOUS | Status: DC | PRN
  Administered 2016-09-16: 1

## 2016-09-16 MED ORDER — SODIUM CHLORIDE 0.9 % IJ SOLN
INTRAMUSCULAR | Status: AC
  Filled 2016-09-16: qty 10

## 2016-09-16 MED ORDER — THROMBIN 5000 UNITS EX SOLR
CUTANEOUS | Status: DC | PRN
  Administered 2016-09-16: 5000 [IU] via TOPICAL

## 2016-09-16 MED ORDER — METHYLPREDNISOLONE ACETATE 40 MG/ML IJ SUSP
INTRAMUSCULAR | Status: AC
  Filled 2016-09-16: qty 1

## 2016-09-16 MED ORDER — ROCURONIUM BROMIDE 50 MG/5ML IV SOLN
INTRAVENOUS | Status: AC
  Filled 2016-09-16: qty 1

## 2016-09-16 MED ORDER — ONDANSETRON HCL 4 MG/2ML IJ SOLN: 4.0000 mg | Freq: Once | INTRAMUSCULAR | Status: DC | PRN

## 2016-09-16 MED ORDER — METAXALONE 800 MG PO TABS
800.0000 mg | ORAL_TABLET | Freq: Three times a day (TID) | ORAL | Status: DC
  Administered 2016-09-16: 800 mg via ORAL
  Filled 2016-09-16 (×5): qty 1

## 2016-09-16 MED ORDER — ROCURONIUM BROMIDE 100 MG/10ML IV SOLN
INTRAVENOUS | Status: DC | PRN
  Administered 2016-09-16: 20 mg via INTRAVENOUS
  Administered 2016-09-16: 40 mg via INTRAVENOUS
  Administered 2016-09-16: 10 mg via INTRAVENOUS

## 2016-09-16 MED ORDER — DEXAMETHASONE SODIUM PHOSPHATE 10 MG/ML IJ SOLN
INTRAMUSCULAR | Status: DC | PRN
  Administered 2016-09-16: 10 mg via INTRAVENOUS

## 2016-09-16 MED ORDER — LIDOCAINE HCL (CARDIAC) 20 MG/ML IV SOLN
INTRAVENOUS | Status: DC | PRN
Start: 2016-09-16 — End: 2016-09-16
  Administered 2016-09-16: 80 mg via INTRAVENOUS

## 2016-09-16 MED ORDER — DEXAMETHASONE SODIUM PHOSPHATE 10 MG/ML IJ SOLN
INTRAMUSCULAR | Status: AC
  Filled 2016-09-16: qty 1

## 2016-09-16 MED ORDER — BUPIVACAINE LIPOSOME 1.3 % IJ SUSP
INTRAMUSCULAR | Status: AC
  Filled 2016-09-16: qty 20

## 2016-09-16 MED ORDER — GELATIN ABSORBABLE 12-7 MM EX MISC
CUTANEOUS | Status: AC
  Filled 2016-09-16: qty 1

## 2016-09-16 MED ORDER — FENTANYL CITRATE (PF) 100 MCG/2ML IJ SOLN
INTRAMUSCULAR | Status: AC
  Administered 2016-09-16: 25 ug via INTRAVENOUS
  Filled 2016-09-16: qty 2

## 2016-09-16 MED ORDER — PROPOFOL 10 MG/ML IV BOLUS
INTRAVENOUS | Status: AC
  Filled 2016-09-16: qty 20

## 2016-09-16 MED ORDER — BUPIVACAINE-EPINEPHRINE (PF) 0.5% -1:200000 IJ SOLN
INTRAMUSCULAR | Status: AC
  Filled 2016-09-16: qty 30

## 2016-09-16 MED ORDER — THROMBIN 5000 UNITS EX SOLR
CUTANEOUS | Status: AC
  Filled 2016-09-16: qty 10000

## 2016-09-16 MED ORDER — BUPIVACAINE-EPINEPHRINE (PF) 0.5% -1:200000 IJ SOLN
INTRAMUSCULAR | Status: DC | PRN
Start: 2016-09-16 — End: 2016-09-16
  Administered 2016-09-16: 30 mL

## 2016-09-16 MED ORDER — SUGAMMADEX SODIUM 200 MG/2ML IV SOLN
INTRAVENOUS | Status: AC
  Filled 2016-09-16: qty 2

## 2016-09-16 MED ORDER — BACITRACIN 50000 UNITS IM SOLR
INTRAMUSCULAR | Status: DC | PRN
  Administered 2016-09-16: 5000 [IU]

## 2016-09-16 SURGICAL SUPPLY — 61 items
BAND RUBBER 3X1/6 TAN STRL (MISCELLANEOUS) ×4 IMPLANT
BLADE BOVIE TIP EXT 4 (BLADE) ×2 IMPLANT
BUR NEURO DRILL SOFT 3.0X3.8M (BURR) ×2 IMPLANT
CANISTER SUCT 1200ML W/VALVE (MISCELLANEOUS) ×2 IMPLANT
CHLORAPREP W/TINT 26ML (MISCELLANEOUS) ×4 IMPLANT
CNTNR SPEC 2.5X3XGRAD LEK (MISCELLANEOUS) ×1
CONT SPEC 4OZ STER OR WHT (MISCELLANEOUS) ×1
CONTAINER SPEC 2.5X3XGRAD LEK (MISCELLANEOUS) ×1 IMPLANT
COUNTER NEEDLE 20/40 LG (NEEDLE) ×2 IMPLANT
COVER LIGHT HANDLE STERIS (MISCELLANEOUS) ×4 IMPLANT
CUP MEDICINE 2OZ PLAST GRAD ST (MISCELLANEOUS) ×4 IMPLANT
DERMABOND ADVANCED (GAUZE/BANDAGES/DRESSINGS) ×1
DERMABOND ADVANCED .7 DNX12 (GAUZE/BANDAGES/DRESSINGS) ×1 IMPLANT
DRAPE C-ARM 42X72 X-RAY (DRAPES) ×4 IMPLANT
DRAPE LAPAROTOMY 100X77 ABD (DRAPES) ×2 IMPLANT
DRAPE MICROSCOPE SPINE 48X150 (DRAPES) ×2 IMPLANT
DRAPE POUCH INSTRU U-SHP 10X18 (DRAPES) ×2 IMPLANT
DRAPE SURG 17X11 SM STRL (DRAPES) ×8 IMPLANT
DRSG TEGADERM 4X4.75 (GAUZE/BANDAGES/DRESSINGS) IMPLANT
DRSG TELFA 4X3 1S NADH ST (GAUZE/BANDAGES/DRESSINGS) IMPLANT
DURASEAL APPLICATOR TIP (TIP) IMPLANT
DURASEAL SPINE SEALANT 3ML (MISCELLANEOUS) IMPLANT
ELECT CAUTERY BLADE TIP 2.5 (TIP) ×2
ELECT EZSTD 165MM 6.5IN (MISCELLANEOUS)
ELECTRODE CAUTERY BLDE TIP 2.5 (TIP) ×1 IMPLANT
ELECTRODE EZSTD 165MM 6.5IN (MISCELLANEOUS) IMPLANT
FRAME EYE SHIELD (PROTECTIVE WEAR) ×2 IMPLANT
GLOVE SURG SYN 8.5  E (GLOVE) ×3
GLOVE SURG SYN 8.5 E (GLOVE) ×3 IMPLANT
GOWN STRL REUS W/ TWL LRG LVL3 (GOWN DISPOSABLE) ×1 IMPLANT
GOWN STRL REUS W/ TWL XL LVL3 (GOWN DISPOSABLE) ×1 IMPLANT
GOWN STRL REUS W/TWL LRG LVL3 (GOWN DISPOSABLE) ×1
GOWN STRL REUS W/TWL XL LVL3 (GOWN DISPOSABLE) ×1
GRADUATE 1200CC STRL 31836 (MISCELLANEOUS) ×2 IMPLANT
KIT SPINAL PRONEVIEW (KITS) ×2 IMPLANT
KNIFE BAYONET SHORT DISCETOMY (MISCELLANEOUS) IMPLANT
MARKER SKIN DUAL TIP RULER LAB (MISCELLANEOUS) ×4 IMPLANT
NDL SAFETY ECLIPSE 18X1.5 (NEEDLE) ×1 IMPLANT
NEEDLE HYPO 18GX1.5 SHARP (NEEDLE) ×1
NEEDLE HYPO 22GX1.5 SAFETY (NEEDLE) ×2 IMPLANT
NS IRRIG 1000ML POUR BTL (IV SOLUTION) ×2 IMPLANT
PACK LAMINECTOMY NEURO (CUSTOM PROCEDURE TRAY) ×2 IMPLANT
PAD ARMBOARD 7.5X6 YLW CONV (MISCELLANEOUS) ×2 IMPLANT
PATTIES SURGICAL .5X1.5 (GAUZE/BANDAGES/DRESSINGS) IMPLANT
SPOGE SURGIFLO 8M (HEMOSTASIS)
SPONGE SURGIFLO 8M (HEMOSTASIS) IMPLANT
STAPLER SKIN PROX 35W (STAPLE) IMPLANT
SUT DVC VLOC 3-0 CL 6 P-12 (SUTURE) ×4 IMPLANT
SUT NURALON 4 0 TR CR/8 (SUTURE) IMPLANT
SUT VIC AB 0 CT1 27 (SUTURE) ×3
SUT VIC AB 0 CT1 27XCR 8 STRN (SUTURE) ×3 IMPLANT
SUT VIC AB 2-0 CT1 18 (SUTURE) ×2 IMPLANT
SUT VIC AB 3-0 SH 27 (SUTURE) ×1
SUT VIC AB 3-0 SH 27X BRD (SUTURE) ×1 IMPLANT
SUT VICRYL 0 UR6 27IN ABS (SUTURE) ×2 IMPLANT
SYR 20CC LL (SYRINGE) ×2 IMPLANT
SYRINGE 10CC LL (SYRINGE) ×2 IMPLANT
TOWEL OR 17X26 4PK STRL BLUE (TOWEL DISPOSABLE) ×4 IMPLANT
TUBE MATRX SPINL 22MM 9CM DISP (INSTRUMENTS) ×1
TUBE METRX SPINAL 22X9 DISP (INSTRUMENTS) ×1 IMPLANT
TUBING CONNECTING 10 (TUBING) ×2 IMPLANT

## 2016-09-16 NOTE — Anesthesia Preprocedure Evaluation (Signed)
Anesthesia Evaluation  Patient identified by MRN, date of birth, ID band Patient awake    Reviewed: Allergy & Precautions, NPO status , Patient's Chart, lab work & pertinent test results  History of Anesthesia Complications Negative for: history of anesthetic complications  Airway Mallampati: II       Dental   Pulmonary neg pulmonary ROS, former smoker,           Cardiovascular negative cardio ROS       Neuro/Psych Anxiety Depression    GI/Hepatic Neg liver ROS, GERD  Medicated and Poorly Controlled,  Endo/Other  negative endocrine ROS  Renal/GU negative Renal ROS     Musculoskeletal   Abdominal   Peds  Hematology negative hematology ROS (+)   Anesthesia Other Findings   Reproductive/Obstetrics                             Anesthesia Physical Anesthesia Plan  ASA: II  Anesthesia Plan: General   Post-op Pain Management:    Induction: Intravenous  Airway Management Planned:   Additional Equipment:   Intra-op Plan:   Post-operative Plan:   Informed Consent: I have reviewed the patients History and Physical, chart, labs and discussed the procedure including the risks, benefits and alternatives for the proposed anesthesia with the patient or authorized representative who has indicated his/her understanding and acceptance.     Plan Discussed with:   Anesthesia Plan Comments:         Anesthesia Quick Evaluation

## 2016-09-16 NOTE — Transfer of Care (Signed)
Immediate Anesthesia Transfer of Care Note  Patient: Kelly [name]  Procedure(s) Performed: Procedure(s): LUMBAR LAMINECTOMY/DECOMPRESSION MICRODISCECTOMY 1 LEVEL L5-S1 (N/A)  Patient Location: PACU  Anesthesia Type:General  Level of Consciousness: awake, alert  and oriented  Airway & Oxygen Therapy: Patient Spontanous Breathing and Patient connected to face mask oxygen  Post-op Assessment: Report given to RN and Post -op Vital signs reviewed and stable  Post vital signs: Reviewed and stable  Last Vitals:  Vitals:   09/16/16 0619 09/16/16 0910  BP: (!) 147/62 (!) 141/71  Pulse: 99 (!) 105  Resp: 18 13  Temp: 36.8 C 36.8 C    Last Pain:  Vitals:   09/16/16 0619  TempSrc: Tympanic  PainSc: 4          Complications: No apparent anesthesia complications

## 2016-09-16 NOTE — Anesthesia Postprocedure Evaluation (Signed)
Anesthesia Post Note  Patient: Kelly [name]  Procedure(s) Performed: Procedure(s) (LRB): LUMBAR LAMINECTOMY/DECOMPRESSION MICRODISCECTOMY 1 LEVEL L5-S1 (N/A)  Patient location during evaluation: PACU Anesthesia Type: General Level of consciousness: awake and alert Pain management: pain level controlled Vital Signs Assessment: post-procedure vital signs reviewed and stable Respiratory status: spontaneous breathing and respiratory function stable Cardiovascular status: stable Anesthetic complications: no     Last Vitals:  Vitals:   09/16/16 0619 09/16/16 0910  BP: (!) 147/62 (!) 141/71  Pulse: 99 (!) 105  Resp: 18 13  Temp: 36.8 C 36.8 C    Last Pain:  Vitals:   09/16/16 0619  TempSrc: Tympanic  PainSc: 4                  KEPHART,WILLIAM K

## 2016-09-16 NOTE — H&P (Signed)
I have reviewed and confirmed my history and physical  with no additions or changes. Plan for R L5-S1 laminoforaminotoy.  Risks and benefits reviewed.

## 2016-09-16 NOTE — Op Note (Signed)
Indications: Right S1 radiculopathy secondary to lumbar spondylosis and foraminal stenosis  Findings: R S1 perineural cyst  Preoperative Diagnosis: Lumbar Stenosis with neurogenic claudication Postoperative Diagnosis: same   EBL: 25 ml IVF: 300 ml Drains: none Disposition: Extubated and Stable to PACU Complications: none  No foley catheter was placed.   Preoperative Note:    Kelly [name] was seen in preop. She has lumbar radiculopathy consistent with R S1 radiculopathy. She failed conservative management, and elected for surgery.  Risks of surgery discussed include: infection, bleeding, stroke, coma, death, paralysis, CSF leak, nerve/spinal cord injury, numbness, tingling, weakness, complex regional pain syndrome, recurrent stenosis and/or disc herniation, vascular injury, development of instability, neck/back pain, need for further surgery, persistent symptoms, development of deformity, and the risks of anesthesia. They understood these risks and have agreed to proceed.  Operative Note:   1. L5-S1 lumbar decompression including central laminectomy and right medial facetectomy including foraminotomy  The patient was then brought from the preoperative center with intravenous access established.  The patient underwent general anesthesia and endotracheal tube intubation, and was then rotated on the [place] rail top where all pressure points were appropriately padded.  The skin was then thoroughly cleansed.  Perioperative antibiotic prophylaxis was administered.  Sterile prep and drapes were then applied and a timeout was then observed.  C-arm was brought into the field under sterile conditions and under lateral visualization the L5-S1 interspace was identified and marked.  The incision was marked on the right and injected with local anesthetic. Once this was complete a 2 cm incision was opened with the use of a #10 blade knife.  The metrx tubes were sequentially advanced and confirmed in  position. An 30mm by 65mm tube was locked in place to the bed side attachment.  Fluoroscopy was then removed from the field.  The microscope was then sterilely brought into the field and muscle creep was hemostased with a bipolar and resected with a pituitary rongeur.  A Bovie extender was then used to expose the spinous process and lamina.  Careful attention was placed to not violate the facet capsule. A 3 mm matchstick drill bit was then used to make a hemi-laminotomy trough until the ligamentum flavum was exposed.  This was extended to the base of the spinous process and to the contralateral side to remove all the central bone from each side.  Once this was complete and the underlying ligamentum flavum was visualized, it was dissected with a curette and resected with Kerrison rongeurs.  Extensive ligamentum hypertrophy was noted, requiring a substantial amount of time and care for removal.  The dura was identified and palpated. The kerrison rongeur was then used to remove the medial facet bilaterally until no compression was noted.  A balltip probe was used to confirm decompression of the right S1 nerve root.  Once this was complete, L5-S1 central decompression including medial facetectomy and foraminotomy was confirmed and decompression on the right side was confirmed. No CSF leak was noted.  A Depo-Medrol soaked Gelfoam pledget was placed in the defect.  The wound was copiously irrigated. The tube system was then removed under microscopic visualization and hemostasis was obtained with a bipolar.  The fascial layer was reapproximated with the use of a 0 Vicryl suture.  Subcutaneous tissue layer was reapproximated using 2-0 Vicryl suture.  3-0 monocryl was placed in subcuticular fashion. The skin was then cleansed and Dermabond was used to close the skin opening.  Patient was then rotated back to the preoperative  bed awakened from anesthesia and taken to recovery all counts are correct in this case.  I  performed the entire procedure.  Zafir Schauer K. Izora Ribas MD

## 2016-09-16 NOTE — Anesthesia Post-op Follow-up Note (Cosign Needed)
Anesthesia QCDR form completed.        

## 2016-09-16 NOTE — Discharge Instructions (Addendum)
Your surgeon has performed an operation on your lumbar spine (low back) to relieve pressure on one or more nerves. Many times, patients feel better immediately after surgery and can overdo it. Even if you feel well, it is important that you follow these activity guidelines. If you do not let your back heal properly from the surgery, you can increase the chance of a disc herniation and return of your symptoms. The following are instructions to help in your recovery once you have been discharged from the hospital.  * Do not take anti-inflammatory medications for 5 days after surgery (naproxen [Aleve], ibuprofen [Advil, Motrin], celecoxib [Celebrex], etc.)  Activity    No bending, lifting, or twisting (BLT). Avoid lifting objects heavier than 10 pounds (gallon milk jug).  Where possible, avoid household activities that involve lifting, bending, pushing, or pulling such as laundry, vacuuming, grocery shopping, and childcare. Try to arrange for help from friends and family for these activities while your back heals.  Increase physical activity slowly as tolerated.  Taking short walks is encouraged, but avoid strenuous exercise. Do not jog, run, bicycle, lift weights, or participate in any other exercises unless specifically allowed by your doctor. Avoid prolonged sitting, including car rides.  Talk to your doctor before resuming sexual activity.  You should not drive until cleared by your doctor.  Until released by your doctor, you should not return to work or school.  You should rest at home and let your body heal.   You may shower three days after your surgery.  After showering, lightly dab your incision dry. Do not take a tub bath or go swimming until approved by your doctor at your follow-up appointment.  If you smoke, we strongly recommend that you quit.  Smoking has been proven to interfere with normal healing in your back and will dramatically reduce the success rate of your surgery. Please  contact [facility] (800-QUIT-NOW) and use the resources at www.[facility].com for assistance in stopping smoking.  Surgical Incision   If you have a dressing on your incision, you may remove it two days after your surgery. Keep your incision area clean and dry.  If you have staples or stitches on your incision, you should have a follow up scheduled for removal. If you do not have staples or stitches, you will have steri-strips (small pieces of surgical tape) or Dermabond glue. The steri-strips/glue should begin to peel away within about a week (it is fine if the steri-strips fall off before then). If the strips are still in place one week after your surgery, you may gently remove them.  Diet            You may return to your usual diet. Be sure to stay hydrated.  When to Contact us  Although your surgery and recovery will likely be uneventful, you may have some residual numbness, aches, and pains in your back and/or legs. This is normal and should improve in the next few weeks.  However, should you experience any of the following, contact us immediately:  New numbness or weakness  Pain that is progressively getting worse, and is not relieved by your pain medications or rest  Bleeding, redness, swelling, pain, or drainage from surgical incision  Chills or flu-like symptoms  Fever greater than 101.0 F (38.3 C)  Problems with bowel or bladder functions  Difficulty breathing or shortness of breath  Warmth, tenderness, or swelling in your calf  Contact Information  During office hours (Monday-Friday 9 am to 5  pm), please call your physician at [phone]  After hours and weekends, please call the [place] Operator at [phone] and ask for the Neurosurgery Resident On Call   For a life-threatening emergency, call 911  General Anesthesia, Adult, Care After These instructions provide you with information about caring for yourself after your procedure. Your health care provider may  also give you more specific instructions. Your treatment has been planned according to current medical practices, but problems sometimes occur. Call your health care provider if you have any problems or questions after your procedure. What can I expect after the procedure? After the procedure, it is common to have:  Vomiting.  A sore throat.  Mental slowness. It is common to feel:  Nauseous.  Cold or shivery.  Sleepy.  Tired.  Sore or achy, even in parts of your body where you did not have surgery. Follow these instructions at home: For at least 24 hours after the procedure:   Do not:  Participate in activities where you could fall or become injured.  Drive.  Use heavy machinery.  Drink alcohol.  Take sleeping pills or medicines that cause drowsiness.  Make important decisions or sign legal documents.  Take care of children on your own.  Rest. Eating and drinking   If you vomit, drink water, juice, or soup when you can drink without vomiting.  Drink enough fluid to keep your urine clear or pale yellow.  Make sure you have little or no nausea before eating solid foods.  Follow the diet recommended by your health care provider. General instructions   Have a responsible adult stay with you until you are awake and alert.  Return to your normal activities as told by your health care provider. Ask your health care provider what activities are safe for you.  Take over-the-counter and prescription medicines only as told by your health care provider.  If you smoke, do not smoke without supervision.  Keep all follow-up visits as told by your health care provider. This is important. Contact a health care provider if:  You continue to have nausea or vomiting at home, and medicines are not helpful.  You cannot drink fluids or start eating again.  You cannot urinate after 8-12 hours.  You develop a skin rash.  You have fever.  You have increasing redness at  the site of your procedure. Get help right away if:  You have difficulty breathing.  You have chest pain.  You have unexpected bleeding.  You feel that you are having a life-threatening or urgent problem. This information is not intended to replace advice given to you by your health care provider. Make sure you discuss any questions you have with your health care provider. Document Released: 09/28/2000 Document Revised: 11/25/2015 Document Reviewed: 06/06/2015 Elsevier Interactive Patient Education  2017 [employer].

## 2016-09-16 NOTE — Anesthesia Procedure Notes (Signed)
Procedure Name: Intubation Date/Time: 09/16/2016 7:28 AM Performed by: Hedda Slade Pre-anesthesia Checklist: Patient identified, Patient being monitored, Timeout performed, Emergency Drugs available and Suction available Patient Re-evaluated:Patient Re-evaluated prior to inductionOxygen Delivery Method: Circle system utilized Preoxygenation: Pre-oxygenation with 100% oxygen Intubation Type: IV induction Ventilation: Mask ventilation without difficulty Laryngoscope Size: Mac and 3 Grade View: Grade I Tube type: Oral Tube size: 7.0 mm Number of attempts: 1 Airway Equipment and Method: Stylet Placement Confirmation: ETT inserted through vocal cords under direct vision,  positive ETCO2 and breath sounds checked- equal and bilateral Secured at: 21 cm Tube secured with: Tape Dental Injury: Teeth and Oropharynx as per pre-operative assessment

## 2016-09-17 ENCOUNTER — Telehealth: Payer: Self-pay | Admitting: Family Medicine

## 2016-09-17 NOTE — Telephone Encounter (Signed)
Patient notified and just wanted to keep Dr. Ancil Boozer informed.

## 2016-09-17 NOTE — Telephone Encounter (Signed)
She needs to contact them back since I am not going to be in the office until Monday

## 2016-09-17 NOTE — Telephone Encounter (Signed)
Pt had back surgery on yesterday and was given antibiotic through IV and it is causing reaction, have rash around neck, face and is going towards the breast area. Dr Cari Caraway office is going to add this to her allergic reaction list. Was told to take some benadryl. States that you know her and her situation and would like to know if you feel she needs anything different or stronger. Uses [facility] Please advise [phone]

## 2016-10-04 ENCOUNTER — Other Ambulatory Visit: Payer: Self-pay | Admitting: Family Medicine

## 2016-10-04 DIAGNOSIS — K219 Gastro-esophageal reflux disease without esophagitis: Secondary | ICD-10-CM

## 2016-10-16 ENCOUNTER — Other Ambulatory Visit: Payer: Self-pay | Admitting: Orthopedic Surgery

## 2016-10-16 DIAGNOSIS — M1712 Unilateral primary osteoarthritis, left knee: Secondary | ICD-10-CM

## 2016-10-16 DIAGNOSIS — M25562 Pain in left knee: Secondary | ICD-10-CM

## 2016-10-19 ENCOUNTER — Ambulatory Visit: Payer: Self-pay | Admitting: Family Medicine

## 2016-10-19 ENCOUNTER — Ambulatory Visit (INDEPENDENT_AMBULATORY_CARE_PROVIDER_SITE_OTHER): Payer: 59 | Admitting: Family Medicine

## 2016-10-19 ENCOUNTER — Encounter: Payer: Self-pay | Admitting: Family Medicine

## 2016-10-19 VITALS — BP 120/68 | HR 96 | Temp 98.1°F | Resp 16 | Ht 65.0 in | Wt 235.9 lb

## 2016-10-19 DIAGNOSIS — E8881 Metabolic syndrome: Secondary | ICD-10-CM

## 2016-10-19 DIAGNOSIS — G43009 Migraine without aura, not intractable, without status migrainosus: Secondary | ICD-10-CM

## 2016-10-19 DIAGNOSIS — M4317 Spondylolisthesis, lumbosacral region: Secondary | ICD-10-CM | POA: Diagnosis not present

## 2016-10-19 DIAGNOSIS — E669 Obesity, unspecified: Secondary | ICD-10-CM

## 2016-10-19 DIAGNOSIS — R300 Dysuria: Secondary | ICD-10-CM | POA: Diagnosis not present

## 2016-10-19 DIAGNOSIS — K219 Gastro-esophageal reflux disease without esophagitis: Secondary | ICD-10-CM

## 2016-10-19 DIAGNOSIS — F411 Generalized anxiety disorder: Secondary | ICD-10-CM | POA: Diagnosis not present

## 2016-10-19 DIAGNOSIS — G4700 Insomnia, unspecified: Secondary | ICD-10-CM | POA: Diagnosis not present

## 2016-10-19 DIAGNOSIS — F331 Major depressive disorder, recurrent, moderate: Secondary | ICD-10-CM | POA: Diagnosis not present

## 2016-10-19 MED ORDER — DOXYCYCLINE HYCLATE 100 MG PO TABS: 100.0000 mg | ORAL_TABLET | Freq: Two times a day (BID) | ORAL | 0 refills | Status: DC

## 2016-10-19 MED ORDER — RANITIDINE HCL 150 MG PO TABS: 150.0000 mg | ORAL_TABLET | Freq: Every day | ORAL | 5 refills | Status: DC

## 2016-10-19 NOTE — Progress Notes (Signed)
Name: Kelly [name]   MRN: [id number]    DOB: [birth year]   Date:10/19/2016       Progress Note  Subjective  Chief Complaint  Chief Complaint  Patient presents with  . Medication Refill    4 month F/U  . Anxiety    A little stressed due to home situation and starting new class  . Migraine    Having more pressure due to weather related   . Gastroesophageal Reflux    Has been worst if she lays on her right side she will wake up with nausea  . Insomnia    Has been worst and with husband moaning all night due to shingles pain- she has been restless and taking nap throughout the day. Sleeps 4-5 hours nighty    HPI  Metabolic Syndrome: she has gained weight, last hgbA1C was 5.5%, she eats when stressed, no polydipsia or polyuria. 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 She finished seeing Dr. Phyllis Ginger, had back surgery 09/2016 with Dr. Cari Caraway. She is off Gabapentin   RLS: she is only on Mirapex , she states symptoms are controlled most of the time  Depression Major and anxiety: seeing Dr. Einar Grad, taking medication as prescribed, having therapy and having new goals for her life. She is still very concerned about her husband and their adopted daughters.   Migraine Headaches:  She states she has noticed worsening with increase in stress at home and also barometric pressure changes. . Associated with nausea, photophobia and phonophobia. Pain is usually temporal and radiates to nuchal area.   Snoring: she states she wakes up feeling tired at times, snores loudly and at times she wakes up gasping at night. She had sleep study and was negative for OSA, she has RLS  Hot Flashes: clonidine is helping with symptoms  GERD: taking Pantoprazole in am's and back on Ranitidine qhs to control symptoms, no longer on prn regiment, she states stress  is higher and symptoms gets worse  Left knee pain: seeing [facility] at [facility] and will have MRI soon, having swelling and instability   Dysuria: past couple of days, also has increase in frequency and urgency  Patient Active Problem List   Diagnosis Date Noted  . Spondylolisthesis of lumbosacral region 02/11/2016  . Hematuria 04/09/2015  . Hyperglycemia 04/06/2015  . Right lumbar radiculitis 12/28/2014  . Moderate recurrent major depression ([place]) 12/27/2014  . Gastro-esophageal reflux disease without esophagitis 12/27/2014  . Bulge of lumbar disc without myelopathy 12/27/2014  . Dysmetabolic syndrome 39/76/7341  . Migraine without aura and responsive to treatment 12/27/2014  . Osteoarthrosis 12/27/2014  . Obesity (BMI 30-39.9) 12/27/2014  . Restless leg 12/27/2014  . Allergic rhinitis, seasonal 12/27/2014    Past Surgical History:  Procedure Laterality Date  . KNEE SURGERY Left 05/2011   Dr. Rudene Christians- Arthroscopic  . LUMBAR LAMINECTOMY/DECOMPRESSION MICRODISCECTOMY N/A 09/16/2016   Procedure: LUMBAR LAMINECTOMY/DECOMPRESSION MICRODISCECTOMY 1 LEVEL L5-S1;  Surgeon: Meade Maw, MD;  Location: [facility] ORS;  Service: Neurosurgery;  Laterality: N/A;  . TONSILLECTOMY AND ADENOIDECTOMY    . URETHRAL STRICTURE DILATATION      Family History  Problem Relation Age of Onset  . Cancer Paternal Grandmother     Social History   Social History  . Marital status: Married    Spouse name: N/A  . Number of  children: N/A  . Years of education: N/A   Occupational History  . Not on file.   Social History Main Topics  . Smoking status: Former Smoker    Packs/day: 0.75    Years: 2.00    Types: Cigarettes    Start date: 07/06/1981    Quit date: 07/07/1983  . Smokeless tobacco: Never Used  . Alcohol use No  . Drug use: No  . Sexual activity: Yes    Partners: Male   Other Topics Concern  . Not on file   Social History Narrative  . No narrative on file     Current  Outpatient Prescriptions:  .  cloNIDine (CATAPRES) 0.1 MG tablet, TAKE 1 TABLET (0.1 MG TOTAL) BY MOUTH AT BEDTIME AS NEEDED. (Patient taking differently: Take 0.1 mg by mouth at bedtime. ), Disp: 30 tablet, Rfl: 5 .  hydrOXYzine (ATARAX/VISTARIL) 10 MG tablet, Take 1 tablet (10 mg total) by mouth 2 (two) times daily as needed. (Patient taking differently: Take 10 mg by mouth 2 (two) times daily as needed for anxiety. ), Disp: 30 tablet, Rfl: 0 .  loratadine (CLARITIN) 10 MG tablet, Take 1 tablet (10 mg total) by mouth daily., Disp: 30 tablet, Rfl: 5 .  metaxalone (SKELAXIN) 800 MG tablet, Take 1 tablet (800 mg total) by mouth 3 (three) times daily., Disp: 30 tablet, Rfl: 0 .  omeprazole (PRILOSEC) 40 MG capsule, Take 1 capsule (40 mg total) by mouth every morning., Disp: 30 capsule, Rfl: 5 .  pramipexole (MIRAPEX) 1.5 MG tablet, TAKE 1 TABLET BY MOUTH EVERY EVENING FOR RLS, Disp: 90 tablet, Rfl: 1 .  ranitidine (ZANTAC) 150 MG tablet, Take 1 tablet (150 mg total) by mouth at bedtime., Disp: 30 tablet, Rfl: 5 .  traZODone (DESYREL) 150 MG tablet, Take 1 tablet (150 mg total) by mouth at bedtime. (Patient taking differently: Take 150 mg by mouth at bedtime as needed for sleep. ), Disp: 30 tablet, Rfl: 1 .  triamcinolone (NASACORT ALLERGY 24HR) 55 MCG/ACT AERO nasal inhaler, Place 2 sprays into the nose at bedtime., Disp: 1 Inhaler, Rfl: 5 .  venlafaxine XR (EFFEXOR-XR) 150 MG 24 hr capsule, Take 1 capsule (150 mg total) by mouth daily. (Patient taking differently: Take 150 mg by mouth at bedtime. ), Disp: 30 capsule, Rfl: 1 .  vitamin B-12 (CYANOCOBALAMIN) 1000 MCG tablet, Take 1,000 mcg by mouth daily., Disp: , Rfl:  .  Vitamin D, Ergocalciferol, (DRISDOL) 50000 units CAPS capsule, TAKE 1 CAPSULE (50,000 UNITS TOTAL) BY MOUTH EVERY 7 (SEVEN) DAYS., Disp: 12 capsule, Rfl: 0  Allergies  Allergen Reactions  . Penicillins Anaphylaxis, Hives, Nausea And Vomiting and Swelling    Has patient had a PCN  reaction causing immediate rash, facial/tongue/throat swelling, SOB or lightheadedness with hypotension: Yes Has patient had a PCN reaction causing severe rash involving mucus membranes or skin necrosis: Yes Has patient had a PCN reaction that required hospitalization No Has patient had a PCN reaction occurring within the last 10 years: Yes If all of the above answers are "NO", then may proceed with Cephalosporin use.   . Ciprofloxacin Hives  . Clindamycin/Lincomycin Hives  . Erythromycin Hives and Nausea And Vomiting  . Keflex [Cephalexin] Hives  . Nitrofurantoin Monohyd Macro Hives and Nausea And Vomiting  . Other Hives  . Sulfa Antibiotics Hives and Nausea And Vomiting    Rapid heart rate  . Adhesive [Tape] Rash  . Latex Rash  . Vancomycin Itching and Rash  ROS  Constitutional: Negative for fever or weight change.  Respiratory: Negative for cough and shortness of breath.   Cardiovascular: Negative for chest pain or palpitations.  Gastrointestinal: Negative for abdominal pain, no bowel changes.  Musculoskeletal: Negative for gait problem or joint swelling.  Skin: Negative for rash. She has a bump on her back  Neurological: Negative for dizziness or headache.  No other specific complaints in a complete review of systems (except as listed in HPI above).  Objective  Vitals:   10/19/16 0811  BP: 120/68  Pulse: 96  Resp: 16  Temp: 98.1 F (36.7 C)  TempSrc: Oral  SpO2: 97%  Weight: 235 lb 14.4 oz (107 kg)  Height: _0  (1.651 m)    Body mass index is 39.26 kg/m.  Physical Exam  Constitutional: Patient appears well-developed and well-nourished. Obese No distress.  HEENT: head atraumatic, normocephalic, pupils equal and reactive to light, neck supple, throat within normal limits Cardiovascular: Normal rate, regular rhythm and normal heart sounds.  No murmur heard. No BLE edema. Pulmonary/Chest: Effort normal and breath sounds normal. No respiratory  distress. Abdominal: Soft.  There is no tenderness. Psychiatric: Patient has a normal mood and affect. behavior is normal. Judgment and thought content normal. Skin: angioma - not a mole  Recent Results (from the past 2160 hour(s))  Urinalysis, Complete w Microscopic     Status: Abnormal   Collection Time: 08/11/16  6:30 PM  Result Value Ref Range   Color, Urine YELLOW (A) YELLOW   APPearance HAZY (A) CLEAR   Specific Gravity, Urine 1.021 1.005 - 1.030   pH 5.0 5.0 - 8.0   Glucose, UA NEGATIVE NEGATIVE mg/dL   Hgb urine dipstick MODERATE (A) NEGATIVE   Bilirubin Urine NEGATIVE NEGATIVE   Ketones, ur 5 (A) NEGATIVE mg/dL   Protein, ur 30 (A) NEGATIVE mg/dL   Nitrite POSITIVE (A) NEGATIVE   Leukocytes, UA SMALL (A) NEGATIVE   RBC / HPF 0-5 0 - 5 RBC/hpf   WBC, UA 6-30 0 - 5 WBC/hpf   Bacteria, UA RARE (A) NONE SEEN   Squamous Epithelial / LPF 0-5 (A) NONE SEEN   Mucous PRESENT   Protime-INR     Status: None   Collection Time: 09/09/16 11:42 AM  Result Value Ref Range   Prothrombin Time 12.0 11.4 - 15.2 seconds   INR 0.89   APTT     Status: None   Collection Time: 09/09/16 11:42 AM  Result Value Ref Range   aPTT 27 24 - 36 seconds  CBC     Status: None   Collection Time: 09/09/16 11:42 AM  Result Value Ref Range   WBC 9.0 3.6 - 11.0 K/uL   RBC 4.56 3.80 - 5.20 MIL/uL   Hemoglobin 13.6 12.0 - 16.0 g/dL   HCT 40.6 35.0 - 47.0 %   MCV 89.0 80.0 - 100.0 fL   MCH 29.8 26.0 - 34.0 pg   MCHC 33.5 32.0 - 36.0 g/dL   RDW 13.1 11.5 - 14.5 %   Platelets 302 150 - 440 K/uL  Differential     Status: None   Collection Time: 09/09/16 11:42 AM  Result Value Ref Range   Neutrophils Relative % 65 %   Neutro Abs 5.9 1.4 - 6.5 K/uL   Lymphocytes Relative 26 %   Lymphs Abs 2.3 1.0 - 3.6 K/uL   Monocytes Relative 6 %   Monocytes Absolute 0.5 0.2 - 0.9 K/uL   Eosinophils Relative 2 %  Eosinophils Absolute 0.2 0 - 0.7 K/uL   Basophils Relative 1 %   Basophils Absolute 0.1 0 - 0.1 K/uL   Basic metabolic panel     Status: None   Collection Time: 09/09/16 11:42 AM  Result Value Ref Range   Sodium 141 135 - 145 mmol/L   Potassium 4.0 3.5 - 5.1 mmol/L   Chloride 105 101 - 111 mmol/L   CO2 28 22 - 32 mmol/L   Glucose, Bld 99 65 - 99 mg/dL   BUN 8 6 - 20 mg/dL   Creatinine, Ser 0.62 0.44 - 1.00 mg/dL   Calcium 9.2 8.9 - 10.3 mg/dL   GFR calc non Af Amer >60 >60 mL/min   GFR calc Af Amer >60 >60 mL/min    Comment: (NOTE) The eGFR has been calculated using the CKD EPI equation. This calculation has not been validated in all clinical situations. eGFR's persistently <60 mL/min signify possible Chronic Kidney Disease.    Anion gap 8 5 - 15  Urinalysis, Complete w Microscopic     Status: Abnormal   Collection Time: 09/09/16 11:42 AM  Result Value Ref Range   Color, Urine YELLOW (A) YELLOW   APPearance CLEAR (A) CLEAR   Specific Gravity, Urine 1.013 1.005 - 1.030   pH 6.0 5.0 - 8.0   Glucose, UA NEGATIVE NEGATIVE mg/dL   Hgb urine dipstick SMALL (A) NEGATIVE   Bilirubin Urine NEGATIVE NEGATIVE   Ketones, ur NEGATIVE NEGATIVE mg/dL   Protein, ur NEGATIVE NEGATIVE mg/dL   Nitrite NEGATIVE NEGATIVE   Leukocytes, UA NEGATIVE NEGATIVE   RBC / HPF 0-5 0 - 5 RBC/hpf   WBC, UA 0-5 0 - 5 WBC/hpf   Bacteria, UA NONE SEEN NONE SEEN   Squamous Epithelial / LPF 0-5 (A) NONE SEEN   Mucous PRESENT   Type and screen [facility]     Status: None   Collection Time: 09/09/16 11:42 AM  Result Value Ref Range   ABO/RH(D) A POS    Antibody Screen NEG    Sample Expiration 09/23/2016    Extend sample reason NO TRANSFUSIONS OR PREGNANCY IN THE PAST 3 MONTHS   Surgical pcr screen     Status: None   Collection Time: 09/09/16 11:47 AM  Result Value Ref Range   MRSA, PCR NEGATIVE NEGATIVE   Staphylococcus aureus NEGATIVE NEGATIVE    Comment:        The Xpert SA Assay (FDA approved for NASAL specimens in patients over 79 years of age), is one component of a  comprehensive surveillance program.  Test performance has been validated by [facility] for patients greater than or equal to 2 year old. It is not intended to diagnose infection nor to guide or monitor treatment.   Pregnancy, urine POC     Status: None   Collection Time: 09/16/16  6:12 AM  Result Value Ref Range   Preg Test, Ur NEGATIVE NEGATIVE    Comment:        THE SENSITIVITY OF THIS METHODOLOGY IS >24 mIU/mL   ABO/Rh     Status: None   Collection Time: 09/16/16  6:28 AM  Result Value Ref Range   ABO/RH(D) A POS     PHQ2/9: Depression screen [facility] 2/9 10/19/2016 06/17/2016 04/17/2016 02/27/2016 02/11/2016  Decreased Interest _0 0  Down, Depressed, Hopeless _1 0  PHQ - 2 Score _2 0  Altered sleeping 3 3 0 3 -  Tired, decreased energy _0 -  Change in appetite 3 3 - 3 -  Feeling bad or failure about yourself  2 3 0 3 -  Trouble concentrating _1 -  Moving slowly or fidgety/restless 3 1 0 2 -  Suicidal thoughts 0 0 0 0 -  PHQ-9 Score _2 -  Difficult doing work/chores Very difficult Very difficult Somewhat difficult Extremely dIfficult -     Fall Risk: Fall Risk  10/19/2016 06/17/2016 04/17/2016 02/27/2016 02/11/2016  Falls in the past year? No No Yes Yes No  Number falls in past yr: - - 1 1 -  Injury with Fall? - - - No -  Follow up - - Falls evaluation completed - -      Functional Status Survey: Is the patient deaf or have difficulty hearing?: No Does the patient have difficulty seeing, even when wearing glasses/contacts?: No Does the patient have difficulty concentrating, remembering, or making decisions?: No Does the patient have difficulty walking or climbing stairs?: No Does the patient have difficulty dressing or bathing?: No Does the patient have difficulty doing errands alone such as visiting a doctor's office or shopping?: No    Assessment & Plan  1. Migraine without aura and responsive to treatment  Getting worse  because of bariatric pressure changes  2. GAD (generalized anxiety disorder)  Seeing Psychiatrist  3. Insomnia, persistent  Seeing psychiatrist   4. Moderate recurrent major depression ([place])  Seeing Psychiatrist   5. Dysmetabolic syndrome  Discussed life style modification   6. Obesity (BMI 30-39.9)  Discussed with the patient the risk posed by an increased BMI. Discussed importance of portion control, calorie counting and at least 150 minutes of physical activity weekly. Avoid sweet beverages and drink more water. Eat at least 6 servings of fruit and vegetables daily   7. Gastro-esophageal reflux disease without esophagitis  - ranitidine (ZANTAC) 150 MG tablet; Take 1 tablet (150 mg total) by mouth at bedtime.  Dispense: 30 tablet; Refill: 5  8. Spondylolisthesis of lumbosacral region  Status post surgery on lumbar spine  9. Dysuria  - Urine Culture - doxycycline (VIBRA-TABS) 100 MG tablet; Take 1 tablet (100 mg total) by mouth 2 (two) times daily.  Dispense: 10 tablet; Refill: 0

## 2016-10-20 LAB — URINE CULTURE: Organism ID, Bacteria: NO GROWTH

## 2016-10-22 ENCOUNTER — Ambulatory Visit (INDEPENDENT_AMBULATORY_CARE_PROVIDER_SITE_OTHER): Payer: 59 | Admitting: Psychiatry

## 2016-10-22 ENCOUNTER — Encounter: Payer: Self-pay | Admitting: Psychiatry

## 2016-10-22 VITALS — BP 136/77 | HR 76 | Temp 98.1°F | Wt 237.4 lb

## 2016-10-22 DIAGNOSIS — F411 Generalized anxiety disorder: Secondary | ICD-10-CM | POA: Diagnosis not present

## 2016-10-22 DIAGNOSIS — F331 Major depressive disorder, recurrent, moderate: Secondary | ICD-10-CM | POA: Diagnosis not present

## 2016-10-22 MED ORDER — VENLAFAXINE HCL ER 150 MG PO CP24: 150.0000 mg | ORAL_CAPSULE | Freq: Every day | ORAL | 2 refills | Status: DC

## 2016-10-22 MED ORDER — TRAZODONE HCL 150 MG PO TABS: 150.0000 mg | ORAL_TABLET | Freq: Every evening | ORAL | 1 refills | Status: DC | PRN

## 2016-10-22 NOTE — Progress Notes (Signed)
Psychiatric Progress note  Patient Identification: Kelly [name] MRN:  [id number] Date of Evaluation:  10/22/2016 Referral Source: Dr.Sowles Chief Complaint:   Chief Complaint    Follow-up; Medication Refill    depression, anxiety Visit Diagnosis:    ICD-9-CM ICD-10-CM   1. MDD (major depressive disorder), recurrent episode, moderate (HCC) 296.32 F33.1   2. GAD (generalized anxiety disorder) 300.02 F41.1     History of Present Illness:  Patient is a 51 year old Caucasian female who Is seen today for a follow-up off for depression and anxiety. Patient reports that she has been doing okay overall. There continues to be a lot of stress surrounding the involvement of DSS in their lives with her adopted children. The court date is coming up soon and she states that there is a chance that the children would go back to her biological parents. She states she thinks that regardless water decision they make would be good for them. States her husband is right now not doing well with the pain from shingles and he is also going to start seeing a therapist. She is doing well at school. Denies any suicidal thoughts. Fair sleep and appetite.  Past Psychiatric History: none, denies any suicidal thoughts  Previous Psychotropic Medications: Yes   Substance Abuse History in the last 12 months:  No.  Consequences of Substance Abuse: Negative  Past Medical History:  Past Medical History:  Diagnosis Date  . Allergy   . Anxiety   . Depression   . GERD (gastroesophageal reflux disease)   . Insomnia   . Intermittent low back pain   . Migraines   . Osteoarthritis   . Recurrent UTI   . Restless leg syndrome   . Sciatica of right side   . Symptomatic menopausal or female climacteric states   . Vitamin D deficiency     Past Surgical History:  Procedure Laterality Date  . KNEE SURGERY Left 05/2011   Dr. Rudene Christians- Arthroscopic  . LUMBAR LAMINECTOMY/DECOMPRESSION MICRODISCECTOMY N/A 09/16/2016    Procedure: LUMBAR LAMINECTOMY/DECOMPRESSION MICRODISCECTOMY 1 LEVEL L5-S1;  Surgeon: Meade Maw, MD;  Location: [facility] ORS;  Service: Neurosurgery;  Laterality: N/A;  . TONSILLECTOMY AND ADENOIDECTOMY    . URETHRAL STRICTURE DILATATION      Family Psychiatric History: on father`s side aunts and uncles, cousins committed suicide (49 of them)  Family History:  Family History  Problem Relation Age of Onset  . Cancer Paternal Grandmother     Social History:   Social History   Social History  . Marital status: Married    Spouse name: N/A  . Number of children: N/A  . Years of education: N/A   Social History Main Topics  . Smoking status: Former Smoker    Packs/day: 0.75    Years: 2.00    Types: Cigarettes    Start date: 07/06/1981    Quit date: 07/07/1983  . Smokeless tobacco: Never Used  . Alcohol use No  . Drug use: No  . Sexual activity: Yes    Partners: Male   Other Topics Concern  . None   Social History Narrative  . None    Additional Social History: Lives with husband and 2 daughters and her mom.  Allergies:   Allergies  Allergen Reactions  . Penicillins Anaphylaxis, Hives, Nausea And Vomiting and Swelling    Has patient had a PCN reaction causing immediate rash, facial/tongue/throat swelling, SOB or lightheadedness with hypotension: Yes Has patient had a PCN reaction causing severe rash involving mucus membranes or  skin necrosis: Yes Has patient had a PCN reaction that required hospitalization No Has patient had a PCN reaction occurring within the last 10 years: Yes If all of the above answers are "NO", then may proceed with Cephalosporin use.   . Ciprofloxacin Hives  . Clindamycin/Lincomycin Hives  . Erythromycin Hives and Nausea And Vomiting  . Keflex [Cephalexin] Hives  . Nitrofurantoin Monohyd Macro Hives and Nausea And Vomiting  . Other Hives  . Sulfa Antibiotics Hives and Nausea And Vomiting    Rapid heart rate  . Adhesive [Tape] Rash  . Latex  Rash  . Vancomycin Itching and Rash    Metabolic Disorder Labs: Lab Results  Component Value Date   HGBA1C 5.5 02/27/2016   MPG 111 02/27/2016   No results found for: PROLACTIN Lab Results  Component Value Date   CHOL 159 02/27/2016   TRIG 137 02/27/2016   HDL 58 02/27/2016   CHOLHDL 2.7 02/27/2016   VLDL 27 02/27/2016   LDLCALC 74 02/27/2016   LDLCALC 78 07/14/2013     Current Medications: Current Outpatient Prescriptions  Medication Sig Dispense Refill  . cloNIDine (CATAPRES) 0.1 MG tablet TAKE 1 TABLET (0.1 MG TOTAL) BY MOUTH AT BEDTIME AS NEEDED. (Patient taking differently: Take 0.1 mg by mouth at bedtime. ) 30 tablet 5  . doxycycline (VIBRA-TABS) 100 MG tablet Take 1 tablet (100 mg total) by mouth 2 (two) times daily. 10 tablet 0  . hydrOXYzine (ATARAX/VISTARIL) 10 MG tablet Take 1 tablet (10 mg total) by mouth 2 (two) times daily as needed. (Patient taking differently: Take 10 mg by mouth 2 (two) times daily as needed for anxiety. ) 30 tablet 0  . loratadine (CLARITIN) 10 MG tablet Take 1 tablet (10 mg total) by mouth daily. 30 tablet 5  . metaxalone (SKELAXIN) 800 MG tablet Take 1 tablet (800 mg total) by mouth 3 (three) times daily. 30 tablet 0  . omeprazole (PRILOSEC) 40 MG capsule Take 1 capsule (40 mg total) by mouth every morning. 30 capsule 5  . pramipexole (MIRAPEX) 1.5 MG tablet TAKE 1 TABLET BY MOUTH EVERY EVENING FOR RLS 90 tablet 1  . traZODone (DESYREL) 150 MG tablet Take 1 tablet (150 mg total) by mouth at bedtime. (Patient taking differently: Take 150 mg by mouth at bedtime as needed for sleep. ) 30 tablet 1  . triamcinolone (NASACORT ALLERGY 24HR) 55 MCG/ACT AERO nasal inhaler Place 2 sprays into the nose at bedtime. 1 Inhaler 5  . venlafaxine XR (EFFEXOR-XR) 150 MG 24 hr capsule Take 1 capsule (150 mg total) by mouth daily. (Patient taking differently: Take 150 mg by mouth at bedtime. ) 30 capsule 1  . vitamin B-12 (CYANOCOBALAMIN) 1000 MCG tablet Take  1,000 mcg by mouth daily.    . Vitamin D, Ergocalciferol, (DRISDOL) 50000 units CAPS capsule TAKE 1 CAPSULE (50,000 UNITS TOTAL) BY MOUTH EVERY 7 (SEVEN) DAYS. 12 capsule 0  . ranitidine (ZANTAC) 150 MG tablet Take 1 tablet (150 mg total) by mouth at bedtime. (Patient not taking: Reported on 10/22/2016) 30 tablet 5   No current facility-administered medications for this visit.     Neurologic: Headache: Negative Seizure: No Paresthesias:No  Musculoskeletal: Strength & Muscle Tone: within normal limits Gait & Station: normal Patient leans: N/A  Psychiatric Specialty Exam: ROS  Blood pressure 136/77, pulse 76, temperature 98.1 F (36.7 C), temperature source Oral, weight 237 lb 6.4 oz (107.7 kg).Body mass index is 39.51 kg/m.  General Appearance: Casual  Eye Contact:  Fair  Speech:  Clear and Coherent  Volume:  Normal  Mood:  improved  Affect:  congruent  Thought Process:  Coherent  Orientation:  Full (Time, Place, and Person)  Thought Content:  WDL  Suicidal Thoughts:  No  Homicidal Thoughts:  No  Memory:  Immediate;   Fair Recent;   Fair Remote;   Fair  Judgement:  Fair  Insight:  Fair  Psychomotor Activity:  Normal  Concentration:  Concentration: Fair and Attention Span: Fair  Recall:  [employer] of Knowledge:Fair  Language: Fair  Akathisia:  No  Handed:  Right  AIMS (if indicated):  na  Assets:  Communication Skills Desire for Improvement Financial Resources/Insurance Housing Resilience Social Support  ADL's:  Intact  Cognition: WNL  Sleep:  fair    Treatment Plan Summary:  Major depressive disorder recurrent  Continue effexor at 150mg  po qd. Continue to see therapist.  Insomnia Continue trazodone to 150mg  po qd.   Anxiety Same as above  Patient has not needed Vistaril and we will not prescribe at this time  Return to Clinic in 3 months  time or call before if necessary    Elvin So, MD 4/19/20189:47 AM

## 2016-11-08 ENCOUNTER — Other Ambulatory Visit: Payer: Self-pay | Admitting: Family Medicine

## 2016-11-08 DIAGNOSIS — J302 Other seasonal allergic rhinitis: Secondary | ICD-10-CM

## 2016-11-13 ENCOUNTER — Ambulatory Visit
Admission: RE | Admit: 2016-11-13 | Discharge: 2016-11-13 | Disposition: A | Discharge status: Home / Self Care | Payer: 59 | Source: Ambulatory Visit | Attending: Orthopedic Surgery | Admitting: Orthopedic Surgery

## 2016-11-13 DIAGNOSIS — M1712 Unilateral primary osteoarthritis, left knee: Secondary | ICD-10-CM | POA: Diagnosis present

## 2016-11-13 DIAGNOSIS — M25562 Pain in left knee: Secondary | ICD-10-CM

## 2016-11-13 DIAGNOSIS — S83012A Lateral subluxation of left patella, initial encounter: Secondary | ICD-10-CM | POA: Insufficient documentation

## 2016-11-19 ENCOUNTER — Other Ambulatory Visit: Payer: Self-pay

## 2016-11-19 DIAGNOSIS — G2581 Restless legs syndrome: Secondary | ICD-10-CM

## 2016-11-19 DIAGNOSIS — K219 Gastro-esophageal reflux disease without esophagitis: Secondary | ICD-10-CM

## 2016-11-19 DIAGNOSIS — R61 Generalized hyperhidrosis: Secondary | ICD-10-CM

## 2016-11-19 DIAGNOSIS — F331 Major depressive disorder, recurrent, moderate: Secondary | ICD-10-CM

## 2016-11-19 DIAGNOSIS — J302 Other seasonal allergic rhinitis: Secondary | ICD-10-CM

## 2016-11-19 NOTE — Telephone Encounter (Signed)
Patient is changing pharmacy to [facility] and they are requesting new Rx to their fax number [phone].Kelly [name]: [phone]. Needs refill on Pramipexole, Loratadine, Clonidine, Venlafaxine, Vitamin D, Nasacort, Metaxalone and Hydroxyzine.

## 2016-11-24 ENCOUNTER — Encounter: Payer: Self-pay | Admitting: Family Medicine

## 2016-12-04 MED ORDER — HYDROXYZINE HCL 10 MG PO TABS: 10.0000 mg | ORAL_TABLET | Freq: Two times a day (BID) | ORAL | 0 refills | Status: DC | PRN

## 2016-12-04 MED ORDER — TRIAMCINOLONE ACETONIDE 55 MCG/ACT NA AERO: 2.0000 | INHALATION_SPRAY | Freq: Every day | NASAL | 5 refills | Status: DC

## 2016-12-04 MED ORDER — PRAMIPEXOLE DIHYDROCHLORIDE 1.5 MG PO TABS: ORAL_TABLET | ORAL | 0 refills | Status: DC

## 2016-12-04 MED ORDER — LORATADINE 10 MG PO TABS: 10.0000 mg | ORAL_TABLET | Freq: Every day | ORAL | 5 refills | Status: DC

## 2016-12-04 MED ORDER — METAXALONE 800 MG PO TABS: 800.0000 mg | ORAL_TABLET | Freq: Three times a day (TID) | ORAL | 0 refills | Status: DC

## 2016-12-04 MED ORDER — RANITIDINE HCL 150 MG PO TABS: 150.0000 mg | ORAL_TABLET | Freq: Every day | ORAL | 5 refills | Status: DC

## 2016-12-04 MED ORDER — OMEPRAZOLE 40 MG PO CPDR: 40.0000 mg | DELAYED_RELEASE_CAPSULE | Freq: Every morning | ORAL | 5 refills | Status: DC

## 2016-12-04 MED ORDER — CLONIDINE HCL 0.1 MG PO TABS: ORAL_TABLET | ORAL | 0 refills | Status: DC

## 2016-12-04 MED ORDER — VENLAFAXINE HCL ER 150 MG PO CP24: 150.0000 mg | ORAL_CAPSULE | Freq: Every day | ORAL | 0 refills | Status: DC

## 2016-12-04 NOTE — Telephone Encounter (Signed)
90-day supplies provided, more refills may be provided at her follow up appointment in June. Please call patient and ask her if she needs an appointment sooner. She had messaged Dr. Ancil Boozer on 11/24/2016 about possibly getting in sooner. Thank you!

## 2016-12-14 ENCOUNTER — Emergency Department
Admission: EM | Admit: 2016-12-14 | Discharge: 2016-12-14 | Disposition: A | Discharge status: Home / Self Care | Payer: 59 | Attending: Emergency Medicine | Admitting: Emergency Medicine

## 2016-12-14 ENCOUNTER — Encounter: Payer: Self-pay | Admitting: Emergency Medicine

## 2016-12-14 ENCOUNTER — Emergency Department: Payer: 59

## 2016-12-14 DIAGNOSIS — M25362 Other instability, left knee: Secondary | ICD-10-CM | POA: Diagnosis not present

## 2016-12-14 DIAGNOSIS — S8012XA Contusion of left lower leg, initial encounter: Secondary | ICD-10-CM | POA: Insufficient documentation

## 2016-12-14 DIAGNOSIS — S8992XA Unspecified injury of left lower leg, initial encounter: Secondary | ICD-10-CM | POA: Diagnosis present

## 2016-12-14 DIAGNOSIS — Y998 Other external cause status: Secondary | ICD-10-CM | POA: Diagnosis not present

## 2016-12-14 DIAGNOSIS — W1830XA Fall on same level, unspecified, initial encounter: Secondary | ICD-10-CM | POA: Diagnosis not present

## 2016-12-14 DIAGNOSIS — Z79899 Other long term (current) drug therapy: Secondary | ICD-10-CM | POA: Diagnosis not present

## 2016-12-14 DIAGNOSIS — Z91040 Latex allergy status: Secondary | ICD-10-CM | POA: Insufficient documentation

## 2016-12-14 DIAGNOSIS — Y939 Activity, unspecified: Secondary | ICD-10-CM | POA: Diagnosis not present

## 2016-12-14 DIAGNOSIS — S8002XA Contusion of left knee, initial encounter: Secondary | ICD-10-CM

## 2016-12-14 DIAGNOSIS — Y929 Unspecified place or not applicable: Secondary | ICD-10-CM | POA: Insufficient documentation

## 2016-12-14 DIAGNOSIS — Z87891 Personal history of nicotine dependence: Secondary | ICD-10-CM | POA: Insufficient documentation

## 2016-12-14 MED ORDER — HYDROMORPHONE HCL 1 MG/ML IJ SOLN
1.0000 mg | Freq: Once | INTRAMUSCULAR | Status: AC
  Administered 2016-12-14: 1 mg via INTRAMUSCULAR
  Filled 2016-12-14: qty 1

## 2016-12-14 MED ORDER — OXYCODONE-ACETAMINOPHEN 7.5-325 MG PO TABS: 1.0000 | ORAL_TABLET | ORAL | 0 refills | Status: DC | PRN

## 2016-12-14 MED ORDER — NAPROXEN 500 MG PO TABS: 500.0000 mg | ORAL_TABLET | Freq: Two times a day (BID) | ORAL | Status: DC

## 2016-12-14 MED ORDER — KETOROLAC TROMETHAMINE 60 MG/2ML IM SOLN
60.0000 mg | Freq: Once | INTRAMUSCULAR | Status: AC
  Administered 2016-12-14: 60 mg via INTRAMUSCULAR
  Filled 2016-12-14: qty 2

## 2016-12-14 NOTE — ED Triage Notes (Signed)
Presents with left knee pain  States she fell this am  And bent her left knee /leg behind her

## 2016-12-14 NOTE — ED Provider Notes (Signed)
[facility] Emergency Department Provider Note    ____________________________________________   First MD Initiated Contact with Patient 12/14/16 1020     (approximate)  I have reviewed the triage vital signs and the nursing notes.   HISTORY  Chief Complaint Fall    HPI Kelly CANEDO is a 51 y.o. female  PATIENT complaining of left knee pain secondary to a fall this morning. Patient said her knee bent behind her and she fell. Patient state she is now unable to bend the knee. Patient rates pain as a 10 over 10. No palliative measures prior to arrival patient was given ice pack triage.  Patient has surgery to left knee and 2012.   Past Medical History:  Diagnosis Date  . Allergy   . Anxiety   . Depression   . GERD (gastroesophageal reflux disease)   . Insomnia   . Intermittent low back pain   . Migraines   . Osteoarthritis   . Recurrent UTI   . Restless leg syndrome   . Sciatica of right side   . Symptomatic menopausal or female climacteric states   . Vitamin D deficiency     Patient Active Problem List   Diagnosis Date Noted  . Spondylolisthesis of lumbosacral region 02/11/2016  . Hematuria 04/09/2015  . Hyperglycemia 04/06/2015  . Right lumbar radiculitis 12/28/2014  . Moderate recurrent major depression ([place]) 12/27/2014  . Gastro-esophageal reflux disease without esophagitis 12/27/2014  . Bulge of lumbar disc without myelopathy 12/27/2014  . Dysmetabolic syndrome 85/46/2703  . Migraine without aura and responsive to treatment 12/27/2014  . Osteoarthrosis 12/27/2014  . Obesity (BMI 30-39.9) 12/27/2014  . Restless leg 12/27/2014  . Allergic rhinitis, seasonal 12/27/2014    Past Surgical History:  Procedure Laterality Date  . KNEE SURGERY Left 05/2011   Dr. Rudene Christians- Arthroscopic  . LUMBAR LAMINECTOMY/DECOMPRESSION MICRODISCECTOMY N/A 09/16/2016   Procedure: LUMBAR LAMINECTOMY/DECOMPRESSION MICRODISCECTOMY 1 LEVEL L5-S1;  Surgeon:  Meade Maw, MD;  Location: [facility] ORS;  Service: Neurosurgery;  Laterality: N/A;  . TONSILLECTOMY AND ADENOIDECTOMY    . URETHRAL STRICTURE DILATATION      Prior to Admission medications   Medication Sig Start Date End Date Taking? Authorizing Provider  pregabalin (LYRICA) 50 MG capsule Take 50 mg by mouth 3 (three) times daily.   Yes [provider]  cloNIDine (CATAPRES) 0.1 MG tablet TAKE 1 TABLET (0.1 MG TOTAL) BY MOUTH AT BEDTIME AS NEEDED. 12/04/16   Hubbard Hartshorn, FNP  doxycycline (VIBRA-TABS) 100 MG tablet Take 1 tablet (100 mg total) by mouth 2 (two) times daily. 10/19/16   Steele Sizer, MD  hydrOXYzine (ATARAX/VISTARIL) 10 MG tablet Take 1 tablet (10 mg total) by mouth 2 (two) times daily as needed. 12/04/16   Hubbard Hartshorn, FNP  loratadine (CLARITIN) 10 MG tablet Take 1 tablet (10 mg total) by mouth daily. 12/04/16   Hubbard Hartshorn, FNP  metaxalone (SKELAXIN) 800 MG tablet Take 1 tablet (800 mg total) by mouth 3 (three) times daily. 12/04/16   Hubbard Hartshorn, FNP  naproxen (NAPROSYN) 500 MG tablet Take 1 tablet (500 mg total) by mouth 2 (two) times daily with a meal. 12/14/16   Sable Feil, PA-C  omeprazole (PRILOSEC) 40 MG capsule Take 1 capsule (40 mg total) by mouth every morning. 12/04/16   Hubbard Hartshorn, FNP  oxyCODONE-acetaminophen (PERCOCET) 7.5-325 MG tablet Take 1 tablet by mouth every 4 (four) hours as needed for severe pain. 12/14/16 12/14/17  Sable Feil,  PA-C  pramipexole (MIRAPEX) 1.5 MG tablet TAKE 1 TABLET BY MOUTH EVERY EVENING FOR RLS 12/04/16   Hubbard Hartshorn, FNP  ranitidine (ZANTAC) 150 MG tablet Take 1 tablet (150 mg total) by mouth at bedtime. 12/04/16   Hubbard Hartshorn, FNP  traZODone (DESYREL) 150 MG tablet Take 1 tablet (150 mg total) by mouth at bedtime as needed for sleep. 10/22/16   Elvin So, MD  triamcinolone (NASACORT ALLERGY 24HR) 55 MCG/ACT AERO nasal inhaler Place 2 sprays into the nose at bedtime. 12/04/16   Hubbard Hartshorn, FNP    venlafaxine XR (EFFEXOR-XR) 150 MG 24 hr capsule Take 1 capsule (150 mg total) by mouth daily. 12/04/16 12/04/17  Hubbard Hartshorn, FNP  vitamin B-12 (CYANOCOBALAMIN) 1000 MCG tablet Take 1,000 mcg by mouth daily.    [provider]  Vitamin D, Ergocalciferol, (DRISDOL) 50000 units CAPS capsule TAKE 1 CAPSULE (50,000 UNITS TOTAL) BY MOUTH EVERY 7 (SEVEN) DAYS. 05/18/16   Steele Sizer, MD    Allergies Penicillins; Ciprofloxacin; Clindamycin/lincomycin; Erythromycin; Keflex [cephalexin]; Nitrofurantoin monohyd macro; Other; Sulfa antibiotics; Adhesive [tape]; Latex; and Vancomycin  Family History  Problem Relation Age of Onset  . Cancer Paternal Grandmother     Social History Social History  Substance Use Topics  . Smoking status: Former Smoker    Packs/day: 0.75    Years: 2.00    Types: Cigarettes    Start date: 07/06/1981    Quit date: 07/07/1983  . Smokeless tobacco: Never Used  . Alcohol use No    Review of Systems  Constitutional: No fever/chills Eyes: No visual changes. ENT: No sore throat. Cardiovascular: Denies chest pain. Respiratory: Denies shortness of breath. Gastrointestinal: No abdominal pain.  No nausea, no vomiting.  No diarrhea.  No constipation. Genitourinary: Negative for dysuria. Musculoskeletal:  Left knee pain Skin: Negative for rash. Neurological: Negative for headaches, focal weakness or numbness. Psychiatric:Anxietyand depression Allergic/Immunilogical: see medication list ____________________________________________   PHYSICAL EXAM:  VITAL SIGNS: ED Triage Vitals  Enc Vitals Group     BP 12/14/16 1016 123/67     Pulse Rate 12/14/16 1016 93     Resp 12/14/16 1016 20     Temp 12/14/16 1016 98.3 F (36.8 C)     Temp Source 12/14/16 1016 Oral     SpO2 12/14/16 1016 97 %     Weight 12/14/16 1017 240 lb (108.9 kg)     Height 12/14/16 1017 5\' 5"  (1.651 m)     Head Circumference --      Peak Flow --      Pain Score --      Pain Loc --       Pain Edu? --      Excl. in [place]? --     Constitutional: Alert and oriented. Well appearing and in no acute distress. .  Oropharynx non-erythematous. Neck: No stridor.  No cervical spine tenderness to palpation. Hematological/Lymphatic/Immunilogical: No cervical lymphadenopathy. Cardiovascular: Normal rate, regular rhythm. Grossly normal heart sounds.  Good peripheral circulation. Respiratory: Normal respiratory effort.  No retractions. Lungs CTAB. Musculoskeletal:  No obvious deformity or edema to the left knee. Moderate guarding palpation inferior patella. Neurologic:  Normal speech and language. No gross focal neurologic deficits are appreciated. No gait instability. Skin:  Skin is warm, dry and intact. No rash noted. Psychiatric: Mood and affect are normal. Speech and behavior are normal.  ____________________________________________   LABS (all labs ordered are listed, but only abnormal results are displayed)  Labs Reviewed - No  data to display ____________________________________________  EKG   ____________________________________________  RADIOLOGY  Dg Knee Complete 4 Views Left  Result Date: 12/14/2016 CLINICAL DATA:  Fall with left knee pain.  Initial encounter. EXAM: LEFT KNEE - COMPLETE 4+ VIEW COMPARISON:  Left knee MRI 11/13/2016 FINDINGS: Small crescentic bony structure in the posterior joint line. No visible donor site or joint effusion to suggest this is an acute fracture. Normal medial alignment. Early marginal spurring. IMPRESSION: 1. Small bony structure in the posterior joint line that is favored chronic. No donor site or joint effusion to suggest acute avulsion fracture. 2. Mild degenerative spurring. Electronically Signed   By: Monte Fantasia M.D.   On: 12/14/2016 11:21    __ No acute findings x-ray of the left knee.__________________________________________   PROCEDURES  Procedure(s) performed: None  Procedures  Critical Care performed:  No  ____________________________________________   INITIAL IMPRESSION / ASSESSMENT AND PLAN / ED COURSE  Pertinent labs & imaging results that were available during my care of the patient were reviewed by me and considered in my medical decision making (see chart for details).   Left knee pain secondary to contusion. Discussed x-ray findings with patient. Patient given discharge care instructions. Patient presenting the immobilizer nd given crutches. Patient advised follow-up follow-up  With family doctor for continual care.      ____________________________________________   FINAL CLINICAL IMPRESSION(S) / ED DIAGNOSES  Final diagnoses:  Contusion of left knee and lower leg, initial encounter      NEW MEDICATIONS STARTED DURING THIS VISIT:  New Prescriptions   NAPROXEN (NAPROSYN) 500 MG TABLET    Take 1 tablet (500 mg total) by mouth 2 (two) times daily with a meal.   OXYCODONE-ACETAMINOPHEN (PERCOCET) 7.5-325 MG TABLET    Take 1 tablet by mouth every 4 (four) hours as needed for severe pain.     Note:  This document was prepared using Dragon voice recognition software and may include unintentional dictation errors.    Sable Feil, PA-C 12/14/16 1144    Darel Hong, MD 12/14/16 [id number]

## 2016-12-14 NOTE — Discharge Instructions (Signed)
With knee support for 3-5 days as needed. Use crutches to assist with ambulation.

## 2016-12-16 ENCOUNTER — Other Ambulatory Visit: Payer: Self-pay

## 2016-12-16 DIAGNOSIS — J302 Other seasonal allergic rhinitis: Secondary | ICD-10-CM

## 2016-12-16 MED ORDER — TRIAMCINOLONE ACETONIDE 55 MCG/ACT NA AERO: 2.0000 | INHALATION_SPRAY | Freq: Every day | NASAL | 5 refills | Status: DC

## 2016-12-16 MED ORDER — VITAMIN D (ERGOCALCIFEROL) 1.25 MG (50000 UNIT) PO CAPS: 50000.0000 [IU] | ORAL_CAPSULE | ORAL | 0 refills | Status: DC

## 2016-12-22 ENCOUNTER — Other Ambulatory Visit: Payer: Self-pay | Admitting: Orthopedic Surgery

## 2016-12-22 DIAGNOSIS — M25562 Pain in left knee: Secondary | ICD-10-CM

## 2016-12-28 ENCOUNTER — Ambulatory Visit
Admission: RE | Admit: 2016-12-28 | Discharge: 2016-12-28 | Disposition: A | Discharge status: Home / Self Care | Payer: 59 | Source: Ambulatory Visit | Attending: Orthopedic Surgery | Admitting: Orthopedic Surgery

## 2016-12-28 DIAGNOSIS — S83282A Other tear of lateral meniscus, current injury, left knee, initial encounter: Secondary | ICD-10-CM | POA: Insufficient documentation

## 2016-12-28 DIAGNOSIS — S83242A Other tear of medial meniscus, current injury, left knee, initial encounter: Secondary | ICD-10-CM | POA: Diagnosis not present

## 2016-12-28 DIAGNOSIS — X58XXXA Exposure to other specified factors, initial encounter: Secondary | ICD-10-CM | POA: Diagnosis not present

## 2016-12-28 DIAGNOSIS — M25562 Pain in left knee: Secondary | ICD-10-CM | POA: Diagnosis present

## 2016-12-31 ENCOUNTER — Other Ambulatory Visit: Payer: Self-pay | Admitting: Family Medicine

## 2016-12-31 ENCOUNTER — Ambulatory Visit (INDEPENDENT_AMBULATORY_CARE_PROVIDER_SITE_OTHER): Payer: 59 | Admitting: Family Medicine

## 2016-12-31 ENCOUNTER — Encounter: Payer: Self-pay | Admitting: Family Medicine

## 2016-12-31 VITALS — BP 118/68 | HR 89 | Temp 98.1°F | Resp 18 | Ht 65.0 in | Wt 235.5 lb

## 2016-12-31 DIAGNOSIS — Z1231 Encounter for screening mammogram for malignant neoplasm of breast: Secondary | ICD-10-CM

## 2016-12-31 DIAGNOSIS — Z1239 Encounter for other screening for malignant neoplasm of breast: Secondary | ICD-10-CM

## 2016-12-31 DIAGNOSIS — R399 Unspecified symptoms and signs involving the genitourinary system: Secondary | ICD-10-CM

## 2016-12-31 DIAGNOSIS — Z124 Encounter for screening for malignant neoplasm of cervix: Secondary | ICD-10-CM

## 2016-12-31 DIAGNOSIS — Z0001 Encounter for general adult medical examination with abnormal findings: Secondary | ICD-10-CM

## 2016-12-31 DIAGNOSIS — Z01419 Encounter for gynecological examination (general) (routine) without abnormal findings: Secondary | ICD-10-CM

## 2016-12-31 DIAGNOSIS — Z114 Encounter for screening for human immunodeficiency virus [HIV]: Secondary | ICD-10-CM

## 2016-12-31 DIAGNOSIS — Z13220 Encounter for screening for lipoid disorders: Secondary | ICD-10-CM

## 2016-12-31 DIAGNOSIS — R739 Hyperglycemia, unspecified: Secondary | ICD-10-CM

## 2016-12-31 DIAGNOSIS — Z1211 Encounter for screening for malignant neoplasm of colon: Secondary | ICD-10-CM

## 2016-12-31 DIAGNOSIS — F331 Major depressive disorder, recurrent, moderate: Secondary | ICD-10-CM

## 2016-12-31 LAB — POCT URINALYSIS DIPSTICK
Bilirubin, UA: NEGATIVE
Blood, UA: POSITIVE
Glucose, UA: NEGATIVE
NITRITE UA: NEGATIVE
PH UA: 6.5 (ref 5.0–8.0)
PROTEIN UA: NEGATIVE
Spec Grav, UA: 1.015 (ref 1.010–1.025)
Urobilinogen, UA: >=8 E.U./dL — AB

## 2016-12-31 LAB — CBC WITH DIFFERENTIAL/PLATELET
BASOS PCT: 0 %
Basophils Absolute: 0 cells/uL (ref 0–200)
Eosinophils Absolute: 198 cells/uL (ref 15–500)
Eosinophils Relative: 2 %
HCT: 43.5 % (ref 35.0–45.0)
Hemoglobin: 13.9 g/dL (ref 11.7–15.5)
LYMPHS PCT: 25 %
Lymphs Abs: 2475 cells/uL (ref 850–3900)
MCH: 28.8 pg (ref 27.0–33.0)
MCHC: 32 g/dL (ref 32.0–36.0)
MCV: 90.1 fL (ref 80.0–100.0)
MONOS PCT: 5 %
MPV: 10.1 fL (ref 7.5–12.5)
Monocytes Absolute: 495 cells/uL (ref 200–950)
Neutro Abs: 6732 cells/uL (ref 1500–7800)
Neutrophils Relative %: 68 %
PLATELETS: 316 10*3/uL (ref 140–400)
RBC: 4.83 MIL/uL (ref 3.80–5.10)
RDW: 13.3 % (ref 11.0–15.0)
WBC: 9.9 10*3/uL (ref 3.8–10.8)

## 2016-12-31 LAB — VITAMIN D 25 HYDROXY (VIT D DEFICIENCY, FRACTURES): VIT D 25 HYDROXY: 56 ng/mL (ref 30–100)

## 2016-12-31 LAB — TSH: TSH: 2.56 mIU/L

## 2016-12-31 NOTE — Patient Instructions (Signed)
Preventive Care 40-64 Years, Female Preventive care refers to lifestyle choices and visits with your health care provider that can promote health and wellness. What does preventive care include?  A yearly physical exam. This is also called an annual well check.  Dental exams once or twice a year.  Routine eye exams. Ask your health care provider how often you should have your eyes checked.  Personal lifestyle choices, including: ? Daily care of your teeth and gums. ? Regular physical activity. ? Eating a healthy diet. ? Avoiding tobacco and drug use. ? Limiting alcohol use. ? Practicing safe sex. ? Taking low-dose aspirin daily starting at age 58. ? Taking vitamin and mineral supplements as recommended by your health care provider. What happens during an annual well check? The services and screenings done by your health care provider during your annual well check will depend on your age, overall health, lifestyle risk factors, and family history of disease. Counseling Your health care provider may ask you questions about your:  Alcohol use.  Tobacco use.  Drug use.  Emotional well-being.  Home and relationship well-being.  Sexual activity.  Eating habits.  Work and work Statistician.  Method of birth control.  Menstrual cycle.  Pregnancy history.  Screening You may have the following tests or measurements:  Height, weight, and BMI.  Blood pressure.  Lipid and cholesterol levels. These may be checked every 5 years, or more frequently if you are over 81 years old.  Skin check.  Lung cancer screening. You may have this screening every year starting at age 78 if you have a 30-pack-year history of smoking and currently smoke or have quit within the past 15 years.  Fecal occult blood test (FOBT) of the stool. You may have this test every year starting at age 65.  Flexible sigmoidoscopy or colonoscopy. You may have a sigmoidoscopy every 5 years or a colonoscopy  every 10 years starting at age 30.  Hepatitis C blood test.  Hepatitis B blood test.  Sexually transmitted disease (STD) testing.  Diabetes screening. This is done by checking your blood sugar (glucose) after you have not eaten for a while (fasting). You may have this done every 1-3 years.  Mammogram. This may be done every 1-2 years. Talk to your health care provider about when you should start having regular mammograms. This may depend on whether you have a family history of breast cancer.  BRCA-related cancer screening. This may be done if you have a family history of breast, ovarian, tubal, or peritoneal cancers.  Pelvic exam and Pap test. This may be done every 3 years starting at age 80. Starting at age 36, this may be done every 5 years if you have a Pap test in combination with an HPV test.  Bone density scan. This is done to screen for osteoporosis. You may have this scan if you are at high risk for osteoporosis.  Discuss your test results, treatment options, and if necessary, the need for more tests with your health care provider. Vaccines Your health care provider may recommend certain vaccines, such as:  Influenza vaccine. This is recommended every year.  Tetanus, diphtheria, and acellular pertussis (Tdap, Td) vaccine. You may need a Td booster every 10 years.  Varicella vaccine. You may need this if you have not been vaccinated.  Zoster vaccine. You may need this after age 5.  Measles, mumps, and rubella (MMR) vaccine. You may need at least one dose of MMR if you were born in  1958 or later. You may also need a second dose.  Pneumococcal 13-valent conjugate (PCV13) vaccine. You may need this if you have certain conditions and were not previously vaccinated.  Pneumococcal polysaccharide (PPSV23) vaccine. You may need one or two doses if you smoke cigarettes or if you have certain conditions.  Meningococcal vaccine. You may need this if you have certain  conditions.  Hepatitis A vaccine. You may need this if you have certain conditions or if you travel or work in places where you may be exposed to hepatitis A.  Hepatitis B vaccine. You may need this if you have certain conditions or if you travel or work in places where you may be exposed to hepatitis B.  Haemophilus influenzae type b (Hib) vaccine. You may need this if you have certain conditions.  Talk to your health care provider about which screenings and vaccines you need and how often you need them. This information is not intended to replace advice given to you by your health care provider. Make sure you discuss any questions you have with your health care provider. Document Released: 07/19/2015 Document Revised: 03/11/2016 Document Reviewed: 04/23/2015 Elsevier Interactive Patient Education  2017 [employer].

## 2016-12-31 NOTE — Progress Notes (Signed)
Name: Kelly [name]   MRN: [id number]    DOB: [birth year]   Date:12/31/2016       Progress Note  Subjective  Chief Complaint  Chief Complaint  Patient presents with  . Annual Exam  . Urinary Tract Infection    HPI  Well Woman: she has been married for 33 years, sexually active, cycles are irregular, she has hot flashes and night sweats. Last pap was 3 years ago. She is also due for mammogram and colonoscopy.   UTI symptoms: she has noticed dysuria, urine is dark and has an odor, also cloudy, symptoms started about one month ago. No back pain or fever. She took cranberry pills and drank more fluids. Symptoms improved, but is worse again now. She is allergic to multiple classed of mediations, we will wait for culture and treat depending on the results  Major Depression: she is struggling again, taking Effexor. She is crying a lot, worried about her husband - some type of neuropathy, her son is not doing well with his wife. Adopted children are a stress to her. They will lose her house and one of their cars this week. She is under the care of therapist and psychiatrist. She denies suicidal or homicidal thoughts, but she would like to have a break [place]   Hyperglycemia: she denies polyphagia, polydipsia or polyuria.    Patient Active Problem List   Diagnosis Date Noted  . Spondylolisthesis of lumbosacral region 02/11/2016  . Hematuria 04/09/2015  . Hyperglycemia 04/06/2015  . Right lumbar radiculitis 12/28/2014  . Moderate recurrent major depression ([place]) 12/27/2014  . Gastro-esophageal reflux disease without esophagitis 12/27/2014  . Bulge of lumbar disc without myelopathy 12/27/2014  . Dysmetabolic syndrome 73/41/9379  . Migraine without aura and responsive to treatment 12/27/2014  . Osteoarthrosis 12/27/2014  . Obesity (BMI 30-39.9) 12/27/2014  . Restless leg 12/27/2014  . Allergic rhinitis, seasonal 12/27/2014    Past Surgical History:   Procedure Laterality Date  . KNEE SURGERY Left 05/2011   Dr. Rudene Christians- Arthroscopic  . LUMBAR LAMINECTOMY/DECOMPRESSION MICRODISCECTOMY N/A 09/16/2016   Procedure: LUMBAR LAMINECTOMY/DECOMPRESSION MICRODISCECTOMY 1 LEVEL L5-S1;  Surgeon: Meade Maw, MD;  Location: [facility] ORS;  Service: Neurosurgery;  Laterality: N/A;  . TONSILLECTOMY AND ADENOIDECTOMY    . URETHRAL STRICTURE DILATATION      Family History  Problem Relation Age of Onset  . Cancer Paternal Grandmother     Social History   Social History  . Marital status: Married    Spouse name: N/A  . Number of children: N/A  . Years of education: N/A   Occupational History  . Not on file.   Social History Main Topics  . Smoking status: Former Smoker    Packs/day: 0.75    Years: 2.00    Types: Cigarettes    Start date: 07/06/1981    Quit date: 07/07/1983  . Smokeless tobacco: Never Used  . Alcohol use No  . Drug use: No  . Sexual activity: Yes    Partners: Male   Other Topics Concern  . Not on file   Social History Narrative  . No narrative on file     Current Outpatient Prescriptions:  .  cloNIDine (CATAPRES) 0.1 MG tablet, TAKE 1 TABLET (0.1 MG TOTAL) BY MOUTH AT BEDTIME AS NEEDED., Disp: 90 tablet, Rfl: 0 .  hydrOXYzine (ATARAX/VISTARIL) 10 MG tablet, Take 1 tablet (10 mg total) by mouth 2 (two) times daily as needed., Disp: 90 tablet, Rfl:  0 .  loratadine (CLARITIN) 10 MG tablet, Take 1 tablet (10 mg total) by mouth daily., Disp: 90 tablet, Rfl: 5 .  omeprazole (PRILOSEC) 40 MG capsule, Take 1 capsule (40 mg total) by mouth every morning., Disp: 90 capsule, Rfl: 5 .  pramipexole (MIRAPEX) 1.5 MG tablet, TAKE 1 TABLET BY MOUTH EVERY EVENING FOR RLS, Disp: 90 tablet, Rfl: 0 .  pregabalin (LYRICA) 50 MG capsule, Take 50 mg by mouth 3 (three) times daily., Disp: , Rfl:  .  ranitidine (ZANTAC) 150 MG tablet, Take 1 tablet (150 mg total) by mouth at bedtime., Disp: 90 tablet, Rfl: 5 .  traZODone (DESYREL) 150 MG  tablet, Take 1 tablet (150 mg total) by mouth at bedtime as needed for sleep. (Patient not taking: Reported on 12/31/2016), Disp: 30 tablet, Rfl: 1 .  triamcinolone (NASACORT ALLERGY 24HR) 55 MCG/ACT AERO nasal inhaler, Place 2 sprays into the nose at bedtime., Disp: 1 Inhaler, Rfl: 5 .  venlafaxine XR (EFFEXOR-XR) 150 MG 24 hr capsule, Take 1 capsule (150 mg total) by mouth daily., Disp: 90 capsule, Rfl: 0 .  vitamin B-12 (CYANOCOBALAMIN) 1000 MCG tablet, Take 1,000 mcg by mouth daily., Disp: , Rfl:   Allergies  Allergen Reactions  . Penicillins Anaphylaxis, Hives, Nausea And Vomiting and Swelling    Has patient had a PCN reaction causing immediate rash, facial/tongue/throat swelling, SOB or lightheadedness with hypotension: Yes Has patient had a PCN reaction causing severe rash involving mucus membranes or skin necrosis: Yes Has patient had a PCN reaction that required hospitalization No Has patient had a PCN reaction occurring within the last 10 years: Yes If all of the above answers are "NO", then may proceed with Cephalosporin use.   . Ciprofloxacin Hives  . Clindamycin/Lincomycin Hives  . Erythromycin Hives and Nausea And Vomiting  . Keflex [Cephalexin] Hives  . Nitrofurantoin Monohyd Macro Hives and Nausea And Vomiting  . Other Hives  . Sulfa Antibiotics Hives and Nausea And Vomiting    Rapid heart rate  . Tetracyclines & Related Hives  . Adhesive [Tape] Rash  . Latex Rash  . Vancomycin Itching and Rash     ROS  Constitutional: Negative for fever, she has mild  weight change.  Respiratory: Negative for cough and shortness of breath.   Cardiovascular: Negative for chest pain or palpitations.  Gastrointestinal: Negative for abdominal pain, no bowel changes.  Musculoskeletal: Negative for gait problem or joint swelling.  Skin: Negative for rash.  Neurological: Negative for dizziness or headache.  No other specific complaints in a complete review of systems (except as listed  in HPI above).  Objective  Vitals:   12/31/16 0820  BP: 118/68  Pulse: 89  Resp: 18  Temp: 98.1 F (36.7 C)  SpO2: 97%  Weight: 235 lb 8 oz (106.8 kg)  Height: 5\' 5"  (1.651 m)    Body mass index is 39.19 kg/m.  Physical Exam  Constitutional: Patient appears well-developed and obese No distress.  HENT: Head: Normocephalic and atraumatic. Ears: B TMs ok, no erythema or effusion; Nose: Nose normal. Mouth/Throat: Oropharynx is clear and moist. No oropharyngeal exudate.  Eyes: Conjunctivae and EOM are normal. Pupils are equal, round, and reactive to light. No scleral icterus.  Neck: Normal range of motion. Neck supple. No JVD present. No thyromegaly present.  Cardiovascular: Normal rate, regular rhythm and normal heart sounds.  No murmur heard. No BLE edema. Pulmonary/Chest: Effort normal and breath sounds normal. No respiratory distress. Abdominal: Soft. Bowel sounds are normal,  no distension. There is no tenderness. no masses Breast: no lumps or masses, no nipple discharge or rashes FEMALE GENITALIA:  External genitalia normal External urethra normal Vaginal vault normal without discharge or lesions Cervix normal without discharge or lesions Bimanual exam normal without masses RECTAL: not done Musculoskeletal: Normal range of motion, no joint effusions. No gross deformities Neurological: he is alert and oriented to person, place, and time. No cranial nerve deficit. Coordination, balance, strength, speech and gait are normal.  Skin: Skin is warm and dry. No rash noted. No erythema.  Psychiatric: Patient has a depressed mood, crying . behavior is normal. Judgment and thought content normal.  Recent Results (from the past 2160 hour(s))  Urine Culture     Status: None   Collection Time: 10/19/16  8:59 AM  Result Value Ref Range   Organism ID, Bacteria NO GROWTH   POCT urinalysis dipstick     Status: Abnormal   Collection Time: 12/31/16  8:26 AM  Result Value Ref Range    Color, UA yellow    Clarity, UA cloudy    Glucose, UA neg    Bilirubin, UA neg    Ketones, UA trace    Spec Grav, UA 1.015 1.010 - 1.025   Blood, UA pos    pH, UA 6.5 5.0 - 8.0   Protein, UA neg    Urobilinogen, UA >=8.0 (A) 0.2 or 1.0 E.U./dL   Nitrite, UA neg    Leukocytes, UA Small (1+) (A) Negative      PHQ2/9: Depression screen [facility] 2/9 10/19/2016 06/17/2016 04/17/2016 02/27/2016 02/11/2016  Decreased Interest 2 2 1 3  0  Down, Depressed, Hopeless 3 1 1 3  0  PHQ - 2 Score 5 3 2 6  0  Altered sleeping 3 3 0 3 -  Tired, decreased energy 3 3 1 3  -  Change in appetite 3 3 - 3 -  Feeling bad or failure about yourself  2 3 0 3 -  Trouble concentrating 2 1 1 2  -  Moving slowly or fidgety/restless 3 1 0 2 -  Suicidal thoughts 0 0 0 0 -  PHQ-9 Score 21 17 4 22  -  Difficult doing work/chores Very difficult Very difficult Somewhat difficult Extremely dIfficult -     Fall Risk: Fall Risk  10/19/2016 06/17/2016 04/17/2016 02/27/2016 02/11/2016  Falls in the past year? No No Yes Yes No  Number falls in past yr: - - 1 1 -  Injury with Fall? - - - No -  Follow up - - Falls evaluation completed - -      Functional Status Survey: Is the patient deaf or have difficulty hearing?: No Does the patient have difficulty seeing, even when wearing glasses/contacts?: No Does the patient have difficulty concentrating, remembering, or making decisions?: Yes (very depressed) Does the patient have difficulty walking or climbing stairs?: No Does the patient have difficulty dressing or bathing?: No Does the patient have difficulty doing errands alone such as visiting a doctor's office or shopping?: No    Assessment & Plan  1. Well woman exam  Discussed importance of 150 minutes of physical activity weekly, eat two servings of fish weekly, eat one serving of tree nuts ( cashews, pistachios, pecans, almonds.Marland Kitchen) every other day, eat 6 servings of fruit/vegetables daily and drink plenty of water and  avoid sweet beverages.  - COMPLETE METABOLIC PANEL WITH GFR - CBC with Differential/Platelet - TSH - Vitamin B12 - VITAMIN D 25 Hydroxy (Vit-D Deficiency, Fractures)  2. UTI  symptoms  - POCT urinalysis dipstick - Urine Culture Go to EC if symptoms gets worse before results of culture is back   3. Hyperglycemia  - Hemoglobin A1c - Insulin, fasting  4. Encounter for screening for cervical cancer   - Pap IG and HPV (high risk) DNA detection  5. Breast cancer screening  - MM Digital Screening; Future  6. Colon cancer screening  - Ambulatory referral to Gastroenterology  7. Encounter for screening for HIV  - HIV antibody  8. Lipid screening  - Lipid panel  9. Moderate recurrent major depression ([place])  Continue follow up with psychiatrist, she was crying today, worried about her husband, but also finances, adopted children and her own son.

## 2017-01-01 LAB — COMPLETE METABOLIC PANEL WITH GFR
ALT: 12 U/L (ref 6–29)
AST: 13 U/L (ref 10–35)
Albumin: 4.2 g/dL (ref 3.6–5.1)
Alkaline Phosphatase: 86 U/L (ref 33–130)
BUN: 9 mg/dL (ref 7–25)
CHLORIDE: 105 mmol/L (ref 98–110)
CO2: 24 mmol/L (ref 20–31)
Calcium: 9.5 mg/dL (ref 8.6–10.4)
Creat: 0.68 mg/dL (ref 0.50–1.05)
GLUCOSE: 99 mg/dL (ref 65–99)
POTASSIUM: 5.2 mmol/L (ref 3.5–5.3)
SODIUM: 139 mmol/L (ref 135–146)
Total Bilirubin: 0.7 mg/dL (ref 0.2–1.2)
Total Protein: 6.8 g/dL (ref 6.1–8.1)

## 2017-01-01 LAB — INSULIN, FASTING: Insulin fasting, serum: 16.9 u[IU]/mL (ref 2.0–19.6)

## 2017-01-01 LAB — LIPID PANEL
CHOL/HDL RATIO: 2.6 ratio (ref <5.0)
Cholesterol: 139 mg/dL (ref <200)
HDL: 53 mg/dL (ref >50)
LDL Cholesterol: 64 mg/dL (ref <100)
Triglycerides: 111 mg/dL (ref <150)
VLDL: 22 mg/dL (ref <30)

## 2017-01-01 LAB — VITAMIN B12: Vitamin B-12: 840 pg/mL (ref 200–1100)

## 2017-01-01 LAB — HEMOGLOBIN A1C
HEMOGLOBIN A1C: 5.6 % (ref <5.7)
MEAN PLASMA GLUCOSE: 114 mg/dL

## 2017-01-01 LAB — HIV ANTIBODY (ROUTINE TESTING W REFLEX): HIV 1&2 Ab, 4th Generation: NONREACTIVE

## 2017-01-02 LAB — URINE CULTURE

## 2017-01-04 ENCOUNTER — Encounter: Payer: Self-pay | Admitting: Emergency Medicine

## 2017-01-04 ENCOUNTER — Emergency Department
Admission: EM | Admit: 2017-01-04 | Discharge: 2017-01-04 | Disposition: A | Discharge status: Home / Self Care | Payer: 59 | Attending: Emergency Medicine | Admitting: Emergency Medicine

## 2017-01-04 DIAGNOSIS — Z87891 Personal history of nicotine dependence: Secondary | ICD-10-CM | POA: Diagnosis not present

## 2017-01-04 DIAGNOSIS — N3001 Acute cystitis with hematuria: Secondary | ICD-10-CM | POA: Diagnosis not present

## 2017-01-04 DIAGNOSIS — R300 Dysuria: Secondary | ICD-10-CM | POA: Diagnosis present

## 2017-01-04 DIAGNOSIS — N3000 Acute cystitis without hematuria: Secondary | ICD-10-CM

## 2017-01-04 LAB — COMPREHENSIVE METABOLIC PANEL
ALT: 13 U/L — ABNORMAL LOW (ref 14–54)
ANION GAP: 8 (ref 5–15)
AST: 19 U/L (ref 15–41)
Albumin: 4.4 g/dL (ref 3.5–5.0)
Alkaline Phosphatase: 69 U/L (ref 38–126)
BUN: 15 mg/dL (ref 6–20)
CHLORIDE: 102 mmol/L (ref 101–111)
CO2: 28 mmol/L (ref 22–32)
Calcium: 9.4 mg/dL (ref 8.9–10.3)
Creatinine, Ser: 0.71 mg/dL (ref 0.44–1.00)
GFR calc non Af Amer: >60 mL/min (ref >60)
Glucose, Bld: 87 mg/dL (ref 65–99)
Potassium: 4.2 mmol/L (ref 3.5–5.1)
SODIUM: 138 mmol/L (ref 135–145)
Total Bilirubin: 0.7 mg/dL (ref 0.3–1.2)
Total Protein: 7.7 g/dL (ref 6.5–8.1)

## 2017-01-04 LAB — URINALYSIS, COMPLETE (UACMP) WITH MICROSCOPIC
BILIRUBIN URINE: NEGATIVE
GLUCOSE, UA: NEGATIVE mg/dL
KETONES UR: NEGATIVE mg/dL
Nitrite: POSITIVE — AB
PROTEIN: NEGATIVE mg/dL
Specific Gravity, Urine: 1.013 (ref 1.005–1.030)
pH: 5 (ref 5.0–8.0)

## 2017-01-04 LAB — CBC
HCT: 43.6 % (ref 35.0–47.0)
HEMOGLOBIN: 14.4 g/dL (ref 12.0–16.0)
MCH: 29.1 pg (ref 26.0–34.0)
MCHC: 33.1 g/dL (ref 32.0–36.0)
MCV: 87.7 fL (ref 80.0–100.0)
Platelets: 328 10*3/uL (ref 150–440)
RBC: 4.97 MIL/uL (ref 3.80–5.20)
RDW: 13.4 % (ref 11.5–14.5)
WBC: 15 10*3/uL — ABNORMAL HIGH (ref 3.6–11.0)

## 2017-01-04 LAB — LIPASE, BLOOD: LIPASE: 26 U/L (ref 11–51)

## 2017-01-04 MED ORDER — FOSFOMYCIN TROMETHAMINE 3 G PO PACK
3.0000 g | PACK | Freq: Once | ORAL | Status: AC
  Administered 2017-01-04: 3 g via ORAL
  Filled 2017-01-04: qty 3

## 2017-01-04 NOTE — ED Notes (Signed)
Patient refused wheel chair out. Patient verbalizes understanding of d/c instructions and follow-up. VS stable and pain controlled per patient.  Patient in NAD at time of d/c and denies further concerns regarding this visit. Patient stable at the time of departure from the unit, departing unit by the safest and most appropriate manner per that patients condition and limitations. Patient advised to return to the ED at any time for emergent concerns, or for new/worsening symptoms.

## 2017-01-04 NOTE — ED Notes (Signed)
Pt a/o, denies pain. Call bell with in reach. Pt knows to call with s/s on allergic reaction.

## 2017-01-04 NOTE — ED Triage Notes (Signed)
Says dr Ancil Boozer called telling her to come to ED due to uti. Did urine on Friday or Thursday.  She wants her to be monitored during antibiotic administration due ot severe allergies.

## 2017-01-04 NOTE — ED Provider Notes (Signed)
[facility] Emergency Department Provider Note   ____________________________________________    I have reviewed the triage vital signs and the nursing notes.   HISTORY  Chief Complaint UTI    HPI Kelly [name] is a 51 y.o. female who presents with complaints of dysuria and feels that she has a urinary tract infection. She was sent in by her PCP after having a urine culture which demonstrated Escherichia coli UTI however the patient has allergies to multiple classes of medications so was referred to the ED for treatment. Patient denies back pain to me. She reports she has had kidney stones and kidney infections in the past and this feels like a mild UTI which she was also able to treat with cranberry juice she says. She complains of mild burning with urination. No fevers or chills reported. No nausea or vomiting.   Past Medical History:  Diagnosis Date  . Allergy   . Anxiety   . Depression   . GERD (gastroesophageal reflux disease)   . Insomnia   . Intermittent low back pain   . Migraines   . Osteoarthritis   . Recurrent UTI   . Restless leg syndrome   . Sciatica of right side   . Symptomatic menopausal or female climacteric states   . Vitamin D deficiency     Patient Active Problem List   Diagnosis Date Noted  . Spondylolisthesis of lumbosacral region 02/11/2016  . Hematuria 04/09/2015  . Hyperglycemia 04/06/2015  . Right lumbar radiculitis 12/28/2014  . Moderate recurrent major depression ([place]) 12/27/2014  . Gastro-esophageal reflux disease without esophagitis 12/27/2014  . Bulge of lumbar disc without myelopathy 12/27/2014  . Dysmetabolic syndrome 50/03/3817  . Migraine without aura and responsive to treatment 12/27/2014  . Osteoarthrosis 12/27/2014  . Obesity (BMI 30-39.9) 12/27/2014  . Restless leg 12/27/2014  . Allergic rhinitis, seasonal 12/27/2014    Past Surgical History:  Procedure Laterality Date  . KNEE SURGERY Left  05/2011   Dr. Rudene Christians- Arthroscopic  . LUMBAR LAMINECTOMY/DECOMPRESSION MICRODISCECTOMY N/A 09/16/2016   Procedure: LUMBAR LAMINECTOMY/DECOMPRESSION MICRODISCECTOMY 1 LEVEL L5-S1;  Surgeon: Meade Maw, MD;  Location: [facility] ORS;  Service: Neurosurgery;  Laterality: N/A;  . TONSILLECTOMY AND ADENOIDECTOMY    . URETHRAL STRICTURE DILATATION      Prior to Admission medications   Medication Sig Start Date End Date Taking? Authorizing Provider  cloNIDine (CATAPRES) 0.1 MG tablet TAKE 1 TABLET (0.1 MG TOTAL) BY MOUTH AT BEDTIME AS NEEDED. 12/04/16   Hubbard Hartshorn, FNP  hydrOXYzine (ATARAX/VISTARIL) 10 MG tablet Take 1 tablet (10 mg total) by mouth 2 (two) times daily as needed. 12/04/16   Hubbard Hartshorn, FNP  loratadine (CLARITIN) 10 MG tablet Take 1 tablet (10 mg total) by mouth daily. 12/04/16   Hubbard Hartshorn, FNP  omeprazole (PRILOSEC) 40 MG capsule Take 1 capsule (40 mg total) by mouth every morning. 12/04/16   Hubbard Hartshorn, FNP  pramipexole (MIRAPEX) 1.5 MG tablet TAKE 1 TABLET BY MOUTH EVERY EVENING FOR RLS 12/04/16   Hubbard Hartshorn, FNP  pregabalin (LYRICA) 50 MG capsule Take 50 mg by mouth 3 (three) times daily.    [provider]  ranitidine (ZANTAC) 150 MG tablet Take 1 tablet (150 mg total) by mouth at bedtime. 12/04/16   Hubbard Hartshorn, FNP  traZODone (DESYREL) 150 MG tablet Take 1 tablet (150 mg total) by mouth at bedtime as needed for sleep. Patient not taking: Reported on 12/31/2016 10/22/16  Elvin So, MD  triamcinolone (NASACORT ALLERGY 24HR) 55 MCG/ACT AERO nasal inhaler Place 2 sprays into the nose at bedtime. 12/16/16   Steele Sizer, MD  venlafaxine XR (EFFEXOR-XR) 150 MG 24 hr capsule Take 1 capsule (150 mg total) by mouth daily. 12/04/16 12/04/17  Hubbard Hartshorn, FNP  vitamin B-12 (CYANOCOBALAMIN) 1000 MCG tablet Take 1,000 mcg by mouth daily.    [provider]     Allergies Penicillins; Ciprofloxacin; Clindamycin/lincomycin; Erythromycin; Keflex [cephalexin];  Nitrofurantoin monohyd macro; Other; Sulfa antibiotics; Tetracyclines & related; Adhesive [tape]; Latex; and Vancomycin  Family History  Problem Relation Age of Onset  . Cancer Paternal Grandmother     Social History Social History  Substance Use Topics  . Smoking status: Former Smoker    Packs/day: 0.75    Years: 2.00    Types: Cigarettes    Start date: 07/06/1981    Quit date: 07/07/1983  . Smokeless tobacco: Never Used  . Alcohol use No    Review of Systems  Constitutional: No fever/chills    Gastrointestinal:No nausea, no vomiting.   Genitourinary: Positive for dysuria     ____________________________________________   PHYSICAL EXAM:  VITAL SIGNS: ED Triage Vitals [01/04/17 0917]  Enc Vitals Group     BP 128/63     Pulse Rate 89     Resp 14     Temp 99 F (37.2 C)     Temp Source Oral     SpO2 99 %     Weight 106.6 kg (235 lb)     Height 1.651 m (5\' 5" )     Head Circumference      Peak Flow      Pain Score 2     Pain Loc      Pain Edu?      Excl. in [place]?     Constitutional: Alert and oriented. No acute distress. Pleasant and interactive  Mouth/Throat: Mucous membranes are moist.    Cardiovascular: Normal rate, regular rhythm.   Good peripheral circulation. Respiratory: Normal respiratory effort.  No retractions.  Gastrointestinal: Soft and nontender. No distention.  No CVA tenderness.  Neurologic:  Normal speech and language. No gross focal neurologic deficits are appreciated.  Skin:  Skin is warm, dry and intact. No rash noted. Psychiatric: Mood and affect are normal. Speech and behavior are normal.  ____________________________________________   LABS (all labs ordered are listed, but only abnormal results are displayed)  Labs Reviewed  COMPREHENSIVE METABOLIC PANEL - Abnormal; Notable for the following:       Result Value   ALT 13 (*)    All other components within normal limits  CBC - Abnormal; Notable for the following:    WBC 15.0 (*)     All other components within normal limits  URINALYSIS, COMPLETE (UACMP) WITH MICROSCOPIC - Abnormal; Notable for the following:    Color, Urine YELLOW (*)    APPearance CLEAR (*)    Hgb urine dipstick SMALL (*)    Nitrite POSITIVE (*)    Leukocytes, UA TRACE (*)    Bacteria, UA FEW (*)    Squamous Epithelial / LPF 0-5 (*)    All other components within normal limits  LIPASE, BLOOD   ____________________________________________  EKG  None ____________________________________________  RADIOLOGY  None ____________________________________________   PROCEDURES  Procedure(s) performed: No    Critical Care performed: No ____________________________________________   INITIAL IMPRESSION / ASSESSMENT AND PLAN / ED COURSE  Pertinent labs & imaging results that were available during my  care of the patient were reviewed by me and considered in my medical decision making (see chart for details).  Patient presents with symptoms of UTI. She has a mildly elevated white blood cell count although reports she recently received a steroid shot. She is afebrile and externally well-appearing. She has no CVA tenderness, no abdominal pain. No indications of sepsis. She appears to have a simple UTI based on my evaluation. Discussed with pharmacist regarding her allergies and and the susceptibilities on her urine culture, we agreed fosfomycin 3 g single dose with a brief observation period is our best option as the patient does not want to be admitted to the hospital  ----------------------------------------- 11:45 AM on 01/04/2017 -----------------------------------------  Patient observed after fosfomycin administration, she has tolerated well, no symptoms of allergic reaction. She remains quite well-appearing and in no distress. She is anxious to leave and is comfortable doing so at this time. She will return if any worsening symptoms     ____________________________________________   FINAL CLINICAL IMPRESSION(S) / ED DIAGNOSES  Final diagnoses:  Acute cystitis without hematuria      NEW MEDICATIONS STARTED DURING THIS VISIT:  New Prescriptions   No medications on file     Note:  This document was prepared using Dragon voice recognition software and may include unintentional dictation errors.    Lavonia Drafts, MD 01/04/17 1146

## 2017-01-05 LAB — PAP IG AND HPV HIGH-RISK: HPV DNA High Risk: NOT DETECTED

## 2017-01-07 ENCOUNTER — Encounter: Payer: Self-pay | Admitting: Family Medicine

## 2017-01-08 ENCOUNTER — Encounter
Admission: RE | Admit: 2017-01-08 | Discharge: 2017-01-08 | Disposition: A | Discharge status: Home / Self Care | Payer: 59 | Source: Ambulatory Visit | Attending: Orthopedic Surgery | Admitting: Orthopedic Surgery

## 2017-01-08 DIAGNOSIS — Z01812 Encounter for preprocedural laboratory examination: Secondary | ICD-10-CM | POA: Insufficient documentation

## 2017-01-08 HISTORY — DX: Dizziness and giddiness: R42

## 2017-01-08 HISTORY — DX: Personal history of urinary calculi: Z87.442

## 2017-01-08 NOTE — Patient Instructions (Signed)
  Your procedure is scheduled on: January 14, 2017 (THURSDAY) Report to Same Day Surgery 2nd floor medical mall ([facility]-take elevator on left to 2nd floor.  Check in with surgery information desk.) To find out your arrival time please call [phone] between 1PM - 3PM on [facility])  Remember: Instructions that are not followed completely may result in serious medical risk, up to and including death, or upon the discretion of your surgeon and anesthesiologist your surgery may need to be rescheduled.    _x___ 1. Do not eat food or drink liquids after midnight. No gum chewing or hard candies                                __x__ 2. No Alcohol for 24 hours before or after surgery.   __x__3. No Smoking for 24 prior to surgery.   ____  4. Bring all medications with you on the day of surgery if instructed.    __x__ 5. Notify your doctor if there is any change in your medical condition     (cold, fever, infections).     Do not wear jewelry, make-up, hairpins, clips or nail polish.  Do not wear lotions, powders, or perfumes.    Do not shave 48 hours prior to surgery. Men may shave face and neck.  Do not bring valuables to the hospital.    [facility] is not responsible for any belongings or valuables.               Contacts, dentures or bridgework may not be worn into surgery.  Leave your suitcase in the car. After surgery it may be brought to your room.  For patients admitted to the hospital, discharge time is determined by your treatment team                       Patients discharged the day of surgery will not be allowed to drive home.  You will need someone to drive you home and stay with you the night of your procedure.    Please read over the following fact sheets that you were given:   [facility] Preparing for Surgery and or MRSA Information   _x___ Take the following medications with a sip of water the morning of surgery :  1.  OMEPRAZOLE     ____Fleets enema or Magnesium Citrate as directed.   ___ Use CHG Soap or sage wipes as directed on instruction sheet   ____ Use inhalers on the day of surgery and bring to hospital day of surgery  ____ Stop Metformin and Janumet 2 days prior to surgery.    ____ Take 1/2 of usual insulin dose the night before surgery and none on the morning  surgery    _x___ Follow recommendations from Cardiologist, Pulmonologist or PCP regarding          stopping Aspirin, Coumadin, Plavix ,Eliquis, Effient, or Pradaxa, and Pletal.  X____Stop Anti-inflammatories such as Advil, Aleve, Ibuprofen, Motrin, Naproxen, Naprosyn, Goodies powders or aspirin products. OK to take Tylenol    _x___ Stop supplements until after surgery.  But may continue Vitamin D, Vitamin B, and multivitamin       ____ Bring C-Pap to the hospital.

## 2017-01-14 ENCOUNTER — Ambulatory Visit: Payer: 59 | Admitting: Anesthesiology

## 2017-01-14 ENCOUNTER — Ambulatory Visit
Admission: RE | Admit: 2017-01-14 | Discharge: 2017-01-14 | Disposition: A | Discharge status: Home / Self Care | Payer: 59 | Source: Ambulatory Visit | Attending: Orthopedic Surgery | Admitting: Orthopedic Surgery

## 2017-01-14 ENCOUNTER — Encounter
Admission: RE | Disposition: A | Discharge status: Home / Self Care | Payer: Self-pay | Source: Ambulatory Visit | Attending: Orthopedic Surgery

## 2017-01-14 ENCOUNTER — Encounter: Payer: Self-pay | Admitting: *Deleted

## 2017-01-14 DIAGNOSIS — W109XXA Fall (on) (from) unspecified stairs and steps, initial encounter: Secondary | ICD-10-CM | POA: Diagnosis not present

## 2017-01-14 DIAGNOSIS — Z880 Allergy status to penicillin: Secondary | ICD-10-CM | POA: Diagnosis not present

## 2017-01-14 DIAGNOSIS — S83282A Other tear of lateral meniscus, current injury, left knee, initial encounter: Secondary | ICD-10-CM | POA: Diagnosis not present

## 2017-01-14 DIAGNOSIS — Z881 Allergy status to other antibiotic agents status: Secondary | ICD-10-CM | POA: Insufficient documentation

## 2017-01-14 DIAGNOSIS — G2581 Restless legs syndrome: Secondary | ICD-10-CM | POA: Insufficient documentation

## 2017-01-14 DIAGNOSIS — S83232A Complex tear of medial meniscus, current injury, left knee, initial encounter: Secondary | ICD-10-CM | POA: Diagnosis not present

## 2017-01-14 DIAGNOSIS — Z87891 Personal history of nicotine dependence: Secondary | ICD-10-CM | POA: Insufficient documentation

## 2017-01-14 DIAGNOSIS — F329 Major depressive disorder, single episode, unspecified: Secondary | ICD-10-CM | POA: Insufficient documentation

## 2017-01-14 DIAGNOSIS — Z6838 Body mass index (BMI) 38.0-38.9, adult: Secondary | ICD-10-CM | POA: Insufficient documentation

## 2017-01-14 DIAGNOSIS — Z882 Allergy status to sulfonamides status: Secondary | ICD-10-CM | POA: Insufficient documentation

## 2017-01-14 DIAGNOSIS — Z91040 Latex allergy status: Secondary | ICD-10-CM | POA: Insufficient documentation

## 2017-01-14 DIAGNOSIS — K219 Gastro-esophageal reflux disease without esophagitis: Secondary | ICD-10-CM | POA: Diagnosis not present

## 2017-01-14 DIAGNOSIS — M1712 Unilateral primary osteoarthritis, left knee: Secondary | ICD-10-CM | POA: Insufficient documentation

## 2017-01-14 DIAGNOSIS — Z888 Allergy status to other drugs, medicaments and biological substances status: Secondary | ICD-10-CM | POA: Insufficient documentation

## 2017-01-14 DIAGNOSIS — F419 Anxiety disorder, unspecified: Secondary | ICD-10-CM | POA: Diagnosis not present

## 2017-01-14 DIAGNOSIS — S83522A Sprain of posterior cruciate ligament of left knee, initial encounter: Secondary | ICD-10-CM | POA: Diagnosis not present

## 2017-01-14 DIAGNOSIS — Z79899 Other long term (current) drug therapy: Secondary | ICD-10-CM | POA: Diagnosis not present

## 2017-01-14 HISTORY — PX: KNEE ARTHROSCOPY WITH MEDIAL MENISECTOMY: SHX5651

## 2017-01-14 LAB — POCT PREGNANCY, URINE: Preg Test, Ur: NEGATIVE

## 2017-01-14 SURGERY — ARTHROSCOPY, KNEE, WITH MEDIAL MENISCECTOMY
Anesthesia: General | Site: Knee | Laterality: Left | Wound class: Clean

## 2017-01-14 MED ORDER — KETAMINE HCL 50 MG/ML IJ SOLN
INTRAMUSCULAR | Status: DC | PRN
  Administered 2017-01-14: 25 mg via INTRAMUSCULAR

## 2017-01-14 MED ORDER — GLYCOPYRROLATE 0.2 MG/ML IJ SOLN
INTRAMUSCULAR | Status: AC
  Filled 2017-01-14: qty 1

## 2017-01-14 MED ORDER — LACTATED RINGERS IV SOLN
INTRAVENOUS | Status: DC | PRN
  Administered 2017-01-14: 09:00:00 via INTRAVENOUS

## 2017-01-14 MED ORDER — BUPIVACAINE-EPINEPHRINE (PF) 0.5% -1:200000 IJ SOLN
INTRAMUSCULAR | Status: DC | PRN
  Administered 2017-01-14: 20 mL

## 2017-01-14 MED ORDER — GLYCOPYRROLATE 0.2 MG/ML IJ SOLN
INTRAMUSCULAR | Status: DC | PRN
  Administered 2017-01-14: 0.2 mg via INTRAVENOUS

## 2017-01-14 MED ORDER — PROPOFOL 10 MG/ML IV BOLUS
INTRAVENOUS | Status: DC | PRN
  Administered 2017-01-14: 200 mg via INTRAVENOUS

## 2017-01-14 MED ORDER — LIDOCAINE HCL (CARDIAC) 20 MG/ML IV SOLN
INTRAVENOUS | Status: DC | PRN
  Administered 2017-01-14: 50 mg via INTRAVENOUS

## 2017-01-14 MED ORDER — BUPIVACAINE-EPINEPHRINE (PF) 0.5% -1:200000 IJ SOLN
INTRAMUSCULAR | Status: AC
Start: 2017-01-14 — End: 2017-01-14
  Filled 2017-01-14: qty 30

## 2017-01-14 MED ORDER — HYDROCODONE-ACETAMINOPHEN 5-325 MG PO TABS
1.0000 | ORAL_TABLET | Freq: Four times a day (QID) | ORAL | 0 refills | Status: DC | PRN
Start: 2017-01-14 — End: 2017-05-10

## 2017-01-14 MED ORDER — ONDANSETRON HCL 4 MG PO TABS: 4.0000 mg | ORAL_TABLET | Freq: Four times a day (QID) | ORAL | Status: DC | PRN

## 2017-01-14 MED ORDER — METOCLOPRAMIDE HCL 5 MG/ML IJ SOLN: 5.0000 mg | Freq: Three times a day (TID) | INTRAMUSCULAR | Status: DC | PRN

## 2017-01-14 MED ORDER — ONDANSETRON HCL 4 MG/2ML IJ SOLN
INTRAMUSCULAR | Status: DC | PRN
  Administered 2017-01-14: 4 mg via INTRAVENOUS

## 2017-01-14 MED ORDER — FENTANYL CITRATE (PF) 100 MCG/2ML IJ SOLN
INTRAMUSCULAR | Status: DC | PRN
  Administered 2017-01-14 (×4): 50 ug via INTRAVENOUS

## 2017-01-14 MED ORDER — FENTANYL CITRATE (PF) 100 MCG/2ML IJ SOLN
INTRAMUSCULAR | Status: AC
  Filled 2017-01-14: qty 2

## 2017-01-14 MED ORDER — DEXAMETHASONE SODIUM PHOSPHATE 10 MG/ML IJ SOLN
INTRAMUSCULAR | Status: DC | PRN
  Administered 2017-01-14: 5 mg via INTRAVENOUS

## 2017-01-14 MED ORDER — KETAMINE HCL 50 MG/ML IJ SOLN
INTRAMUSCULAR | Status: AC
  Filled 2017-01-14: qty 10

## 2017-01-14 MED ORDER — ONDANSETRON HCL 4 MG/2ML IJ SOLN: 4.0000 mg | Freq: Four times a day (QID) | INTRAMUSCULAR | Status: DC | PRN

## 2017-01-14 MED ORDER — MIDAZOLAM HCL 2 MG/2ML IJ SOLN
INTRAMUSCULAR | Status: AC
  Filled 2017-01-14: qty 2

## 2017-01-14 MED ORDER — ONDANSETRON HCL 4 MG/2ML IJ SOLN
INTRAMUSCULAR | Status: AC
  Filled 2017-01-14: qty 2

## 2017-01-14 MED ORDER — METOCLOPRAMIDE HCL 10 MG PO TABS: 5.0000 mg | ORAL_TABLET | Freq: Three times a day (TID) | ORAL | Status: DC | PRN

## 2017-01-14 MED ORDER — KETOROLAC TROMETHAMINE 30 MG/ML IJ SOLN
INTRAMUSCULAR | Status: DC | PRN
  Administered 2017-01-14: 30 mg via INTRAVENOUS

## 2017-01-14 MED ORDER — PROPOFOL 10 MG/ML IV BOLUS
INTRAVENOUS | Status: AC
  Filled 2017-01-14: qty 20

## 2017-01-14 MED ORDER — SODIUM CHLORIDE 0.9 % IV SOLN: INTRAVENOUS | Status: DC

## 2017-01-14 MED ORDER — HYDROCODONE-ACETAMINOPHEN 5-325 MG PO TABS: 1.0000 | ORAL_TABLET | ORAL | Status: DC | PRN

## 2017-01-14 MED ORDER — MIDAZOLAM HCL 2 MG/2ML IJ SOLN
INTRAMUSCULAR | Status: DC | PRN
Start: 2017-01-14 — End: 2017-01-14
  Administered 2017-01-14: 2 mg via INTRAVENOUS

## 2017-01-14 MED ORDER — FENTANYL CITRATE (PF) 100 MCG/2ML IJ SOLN
25.0000 ug | INTRAMUSCULAR | Status: DC | PRN
  Administered 2017-01-14 (×2): 25 ug via INTRAVENOUS

## 2017-01-14 MED ORDER — FENTANYL CITRATE (PF) 100 MCG/2ML IJ SOLN
INTRAMUSCULAR | Status: AC
  Administered 2017-01-14: 25 ug via INTRAVENOUS
  Filled 2017-01-14: qty 2

## 2017-01-14 MED ORDER — ONDANSETRON HCL 4 MG/2ML IJ SOLN: 4.0000 mg | Freq: Once | INTRAMUSCULAR | Status: DC | PRN

## 2017-01-14 SURGICAL SUPPLY — 24 items
BANDAGE ACE 4X5 VEL STRL LF (GAUZE/BANDAGES/DRESSINGS) IMPLANT
BLADE INCISOR PLUS 4.5 (BLADE) IMPLANT
CHLORAPREP W/TINT 26ML (MISCELLANEOUS) ×2 IMPLANT
CUFF TOURN 24 STER (MISCELLANEOUS) IMPLANT
CUFF TOURN 30 STER DUAL PORT (MISCELLANEOUS) IMPLANT
GAUZE SPONGE 4X4 12PLY STRL (GAUZE/BANDAGES/DRESSINGS) ×2 IMPLANT
GLOVE SURG SYN 9.0  PF PI (GLOVE) ×1
GLOVE SURG SYN 9.0 PF PI (GLOVE) ×1 IMPLANT
GOWN SRG 2XL LVL 4 RGLN SLV (GOWNS) ×1 IMPLANT
GOWN STRL NON-REIN 2XL LVL4 (GOWNS) ×1
GOWN STRL REUS W/ TWL LRG LVL3 (GOWN DISPOSABLE) ×2 IMPLANT
GOWN STRL REUS W/TWL LRG LVL3 (GOWN DISPOSABLE) ×2
IV LACTATED RINGER IRRG 3000ML (IV SOLUTION) ×2
IV LR IRRIG 3000ML ARTHROMATIC (IV SOLUTION) ×2 IMPLANT
KIT RM TURNOVER STRD PROC AR (KITS) ×2 IMPLANT
MANIFOLD NEPTUNE II (INSTRUMENTS) ×2 IMPLANT
PACK ARTHROSCOPY KNEE (MISCELLANEOUS) ×2 IMPLANT
SET TUBE SUCT SHAVER OUTFL 24K (TUBING) ×2 IMPLANT
SET TUBE TIP INTRA-ARTICULAR (MISCELLANEOUS) ×2 IMPLANT
SUT ETHILON 4-0 (SUTURE) ×1
SUT ETHILON 4-0 FS2 18XMFL BLK (SUTURE) ×1
SUTURE ETHLN 4-0 FS2 18XMF BLK (SUTURE) ×1 IMPLANT
TUBING ARTHRO INFLOW-ONLY STRL (TUBING) ×2 IMPLANT
WAND COBLATION FLOW 50 (SURGICAL WAND) ×2 IMPLANT

## 2017-01-14 NOTE — Op Note (Signed)
01/14/2017  9:52 AM  PATIENT:  Kelly [name]  52 y.o. female  PRE-OPERATIVE DIAGNOSIS:  complex tear of medial meniscus tear of left knee as current injury and lateral meniscus tear  POST-OPERATIVE DIAGNOSIS:  complex tear of medial meniscus tear of left knee as current injury and lateral meniscus tear  PROCEDURE:  Procedure(s) with comments: KNEE ARTHROSCOPY WITH MEDIAL AND LATERAL MENISECTOMY (Left) - Partial Knee menisectomy  SURGEON: Laurene Footman, MD  ASSISTANTS: None  ANESTHESIA:   general  EBL:  Total I/O In: 600 [I.V.:600] Out: -   BLOOD ADMINISTERED:none  DRAINS: none   LOCAL MEDICATIONS USED:  MARCAINE     SPECIMEN:  No Specimen  DISPOSITION OF SPECIMEN:  N/A  COUNTS:  YES  TOURNIQUET:    IMPLANTS: 10 minutes at 300 mmHg  DICTATION: .Dragon Dictation patient brought the operating room and after adequate anesthesia was obtained the left leg was prepped and draped in sterile fashion. After appropriate patient identification and timeout procedures were completed, an inferior lateral portal was made and the arthroscope was introduced. Initial inspection revealed moderate patellofemoral degenerative change with normal tracking coming around medial compartment the prior arthroscopy left large scar and this was elliptically excised and the inferior medial portal made on probing there was a tear at the junction of the middle and posterior thirds going out to the rim this was subsequently debrided with meniscal punch and ArthriCare wand to a stable margin. Articular cartilage showed mild wear and partial-thickness loss but no fissuring and no exposed bone. In the notch there appeared be a few fibers of the anterior cruciate ligament flipped into the notch the anterior cruciate ligament appeared intact this these loose strands were ablated with the ArthroCare wand. Going lateral compartment there is a tear of the middle third of the meniscus with a flap tear which was  debrided with the ArthroCare wand again the articular cartilage laterally was relatively normal. The gutters were free of any loose bodies tourniquet was raised because of some excessive bleeding into the joint during the procedure after thorough irrigation of the joint argentation was withdrawn wounds were illustrated 10 cc of half percent Sensorcaine with epinephrine followed by closure of the wounds with simple 4-0 nylon. Xeroform 4 x 4 web roll and Ace wrap applied  PLAN OF CARE: Discharge to home after PACU  PATIENT DISPOSITION:  PACU - hemodynamically stable.

## 2017-01-14 NOTE — H&P (Signed)
Reviewed paper H+P, will be scanned into chart. No changes noted.  

## 2017-01-14 NOTE — Anesthesia Postprocedure Evaluation (Signed)
Anesthesia Post Note  Patient: Kelly [name]  Procedure(s) Performed: Procedure(s) (LRB): KNEE ARTHROSCOPY WITH MEDIAL AND LATERAL MENISECTOMY (Left)  Patient location during evaluation: PACU Anesthesia Type: General Level of consciousness: awake and alert Pain management: pain level controlled Vital Signs Assessment: post-procedure vital signs reviewed and stable Respiratory status: spontaneous breathing, nonlabored ventilation, respiratory function stable and patient connected to nasal cannula oxygen Cardiovascular status: blood pressure returned to baseline and stable Postop Assessment: no signs of nausea or vomiting Anesthetic complications: no     Last Vitals:  Vitals:   01/14/17 1043 01/14/17 1100  BP: 128/61 (!) 126/55  Pulse: 69 74  Resp: 16 16  Temp: 36.4 C     Last Pain:  Vitals:   01/14/17 1043  TempSrc:   PainSc: 0-No pain                 [name] S

## 2017-01-14 NOTE — Transfer of Care (Signed)
Immediate Anesthesia Transfer of Care Note  Patient: Kelly [name]  Procedure(s) Performed: Procedure(s) with comments: KNEE ARTHROSCOPY WITH MEDIAL AND LATERAL MENISECTOMY (Left) - Partial Knee menisectomy  Patient Location: PACU  Anesthesia Type:General  Level of Consciousness: sedated  Airway & Oxygen Therapy: Patient Spontanous Breathing and Patient connected to face mask oxygen  Post-op Assessment: Report given to RN and Post -op Vital signs reviewed and stable  Post vital signs: Reviewed  Last Vitals:  Vitals:   01/14/17 0725 01/14/17 0951  BP: 117/65 (!) 133/53  Pulse: 76 (!) 101  Resp: 16 14  Temp: 36.8 C (!) 36.2 C    Last Pain:  Vitals:   01/14/17 0725  TempSrc: Oral  PainSc: 5          Complications: No apparent anesthesia complications

## 2017-01-14 NOTE — Anesthesia Procedure Notes (Signed)
Procedure Name: LMA Insertion Performed by: Coy Vandoren Pre-anesthesia Checklist: Patient identified, Patient being monitored, Timeout performed, Emergency Drugs available and Suction available Patient Re-evaluated:Patient Re-evaluated prior to induction Oxygen Delivery Method: Circle system utilized Preoxygenation: Pre-oxygenation with 100% oxygen Induction Type: IV induction Ventilation: Mask ventilation without difficulty LMA: LMA inserted LMA Size: 3.5 Tube type: Oral Number of attempts: 1 Placement Confirmation: positive ETCO2 and breath sounds checked- equal and bilateral Tube secured with: Tape Dental Injury: Teeth and Oropharynx as per pre-operative assessment        

## 2017-01-14 NOTE — Anesthesia Preprocedure Evaluation (Signed)
Anesthesia Evaluation  Patient identified by MRN, date of birth, ID band Patient awake    Reviewed: Allergy & Precautions, NPO status , Patient's Chart, lab work & pertinent test results, reviewed documented beta blocker date and time   Airway Mallampati: III  TM Distance: >3 FB     Dental  (+) Chipped   Pulmonary former smoker,           Cardiovascular      Neuro/Psych  Headaches, PSYCHIATRIC DISORDERS Anxiety Depression  Neuromuscular disease    GI/Hepatic GERD  Controlled,  Endo/Other  Morbid obesity  Renal/GU      Musculoskeletal  (+) Arthritis ,   Abdominal   Peds  Hematology   Anesthesia Other Findings RLS.  Reproductive/Obstetrics                             Anesthesia Physical Anesthesia Plan  ASA: III  Anesthesia Plan: General   Post-op Pain Management:    Induction: Intravenous  PONV Risk Score and Plan:   Airway Management Planned: LMA  Additional Equipment:   Intra-op Plan:   Post-operative Plan:   Informed Consent: I have reviewed the patients History and Physical, chart, labs and discussed the procedure including the risks, benefits and alternatives for the proposed anesthesia with the patient or authorized representative who has indicated his/her understanding and acceptance.     Plan Discussed with: CRNA  Anesthesia Plan Comments:         Anesthesia Quick Evaluation

## 2017-01-14 NOTE — Discharge Instructions (Addendum)
Weightbearing as tolerated. Keep bandage clean and dry. If bandage slides down the leg, remove entire bandage covered to incisions with Band-Aids then reapply Ace wrap. Aspirin 325 mg daily. Pain medicine as directed    AMBULATORY SURGERY  DISCHARGE INSTRUCTIONS   1) The drugs that you were given will stay in your system until tomorrow so for the next 24 hours you should not:  A) Drive an automobile B) Make any legal decisions C) Drink any alcoholic beverage   2) You may resume regular meals tomorrow.  Today it is better to start with liquids and gradually work up to solid foods.  You may eat anything you prefer, but it is better to start with liquids, then soup and crackers, and gradually work up to solid foods.   3) Please notify your doctor immediately if you have any unusual bleeding, trouble breathing, redness and pain at the surgery site, drainage, fever, or pain not relieved by medication.    4) Additional Instructions:        Please contact your physician with any problems or Same Day Surgery at [phone], Monday through Friday 6 am to 4 pm, or [place] at [facility] number at [phone].

## 2017-01-14 NOTE — Anesthesia Post-op Follow-up Note (Cosign Needed)
Anesthesia QCDR form completed.        

## 2017-01-15 ENCOUNTER — Emergency Department
Admission: EM | Admit: 2017-01-15 | Discharge: 2017-01-15 | Disposition: A | Discharge status: Home / Self Care | Payer: 59 | Attending: Emergency Medicine | Admitting: Emergency Medicine

## 2017-01-15 ENCOUNTER — Encounter: Payer: Self-pay | Admitting: Emergency Medicine

## 2017-01-15 DIAGNOSIS — L299 Pruritus, unspecified: Secondary | ICD-10-CM | POA: Insufficient documentation

## 2017-01-15 DIAGNOSIS — R600 Localized edema: Secondary | ICD-10-CM | POA: Diagnosis present

## 2017-01-15 DIAGNOSIS — R609 Edema, unspecified: Secondary | ICD-10-CM

## 2017-01-15 DIAGNOSIS — Z91040 Latex allergy status: Secondary | ICD-10-CM | POA: Insufficient documentation

## 2017-01-15 DIAGNOSIS — Z79899 Other long term (current) drug therapy: Secondary | ICD-10-CM | POA: Diagnosis not present

## 2017-01-15 DIAGNOSIS — R070 Pain in throat: Secondary | ICD-10-CM | POA: Insufficient documentation

## 2017-01-15 DIAGNOSIS — Z87891 Personal history of nicotine dependence: Secondary | ICD-10-CM | POA: Diagnosis not present

## 2017-01-15 MED ORDER — PREDNISONE 20 MG PO TABS: 40.0000 mg | ORAL_TABLET | Freq: Every day | ORAL | 0 refills | Status: DC

## 2017-01-15 MED ORDER — SODIUM CHLORIDE 0.9 % IV BOLUS (SEPSIS)
1000.0000 mL | Freq: Once | INTRAVENOUS | Status: AC
  Administered 2017-01-15: 1000 mL via INTRAVENOUS

## 2017-01-15 MED ORDER — OXYCODONE-ACETAMINOPHEN 5-325 MG PO TABS
1.0000 | ORAL_TABLET | Freq: Once | ORAL | Status: AC
  Administered 2017-01-15: 1 via ORAL
  Filled 2017-01-15: qty 1

## 2017-01-15 MED ORDER — METHYLPREDNISOLONE SODIUM SUCC 125 MG IJ SOLR
125.0000 mg | Freq: Once | INTRAMUSCULAR | Status: AC
  Administered 2017-01-15: 125 mg via INTRAVENOUS
  Filled 2017-01-15: qty 2

## 2017-01-15 NOTE — ED Notes (Signed)
Triage information entered on this patient not typed by this RN. Entered by Otila Kluver, RN under this RN's sign in by mistake.

## 2017-01-15 NOTE — ED Notes (Signed)
MD made aware of b/p 88/48. New orders for saline bolus

## 2017-01-15 NOTE — ED Provider Notes (Signed)
[facility] Emergency Department Provider Note  ____________________________________   I have reviewed the triage vital signs and the nursing notes.   HISTORY  Chief Complaint Allergic Reaction; Post-op Problem; Dysphagia; and Pruritis   History limited by: Not Limited   HPI Kelly [name] is a 51 y.o. female who presents to the emergency department today because of concerns for facial swelling and throat discomfort. The patient underwent a left knee arthroscopically yesterday. Since the time of her surgery she has felt like she has had some throat discomfort. She does feel like her tongue is slightly swollen. She is also noticed some swelling and potential hive-like rash around her face. She did take Benadryl last night which might of help that the symptoms slightly. She did take some hydrocodone for pain the last night is unsure if this was a reaction to that medication.   Past Medical History:  Diagnosis Date  . Allergy   . Anxiety   . Depression   . GERD (gastroesophageal reflux disease)   . History of kidney stones   . Insomnia   . Intermittent low back pain   . Migraines   . Osteoarthritis   . Recurrent UTI   . Restless leg syndrome   . Sciatica of right side   . Symptomatic menopausal or female climacteric states   . Vertigo   . Vitamin D deficiency     Patient Active Problem List   Diagnosis Date Noted  . Spondylolisthesis of lumbosacral region 02/11/2016  . Hematuria 04/09/2015  . Hyperglycemia 04/06/2015  . Right lumbar radiculitis 12/28/2014  . Moderate recurrent major depression ([place]) 12/27/2014  . Gastro-esophageal reflux disease without esophagitis 12/27/2014  . Bulge of lumbar disc without myelopathy 12/27/2014  . Dysmetabolic syndrome 27/12/2374  . Migraine without aura and responsive to treatment 12/27/2014  . Osteoarthrosis 12/27/2014  . Obesity (BMI 30-39.9) 12/27/2014  . Restless leg 12/27/2014  . Allergic rhinitis,  seasonal 12/27/2014    Past Surgical History:  Procedure Laterality Date  . BACK SURGERY    . DIAGNOSTIC LAPAROSCOPY    . DILATION AND CURETTAGE OF UTERUS    . KNEE ARTHROSCOPY WITH MEDIAL MENISECTOMY Left 01/14/2017   Procedure: KNEE ARTHROSCOPY WITH MEDIAL AND LATERAL MENISECTOMY;  Surgeon: Hessie Knows, MD;  Location: [facility] ORS;  Service: Orthopedics;  Laterality: Left;  Partial Knee menisectomy  . KNEE SURGERY Left 05/2011   Dr. Rudene Christians- Arthroscopic  . LUMBAR LAMINECTOMY/DECOMPRESSION MICRODISCECTOMY N/A 09/16/2016   Procedure: LUMBAR LAMINECTOMY/DECOMPRESSION MICRODISCECTOMY 1 LEVEL L5-S1;  Surgeon: Meade Maw, MD;  Location: [facility] ORS;  Service: Neurosurgery;  Laterality: N/A;  . TONSILLECTOMY AND ADENOIDECTOMY    . URETHRAL STRICTURE DILATATION      Prior to Admission medications   Medication Sig Start Date End Date Taking? Authorizing Provider  cloNIDine (CATAPRES) 0.1 MG tablet TAKE 1 TABLET (0.1 MG TOTAL) BY MOUTH AT BEDTIME AS NEEDED. 12/04/16   Hubbard Hartshorn, FNP  HYDROcodone-acetaminophen (NORCO) 5-325 MG tablet Take 1-2 tablets by mouth every 6 (six) hours as needed for moderate pain. 01/14/17   Hessie Knows, MD  hydrOXYzine (ATARAX/VISTARIL) 10 MG tablet Take 1 tablet (10 mg total) by mouth 2 (two) times daily as needed. 12/04/16   Hubbard Hartshorn, FNP  loratadine (CLARITIN) 10 MG tablet Take 1 tablet (10 mg total) by mouth daily. 12/04/16   Hubbard Hartshorn, FNP  metaxalone (SKELAXIN) 800 MG tablet Take 800 mg by mouth 3 (three) times daily.    [provider]  omeprazole (PRILOSEC) 40 MG capsule Take 1 capsule (40 mg total) by mouth every morning. 12/04/16   Hubbard Hartshorn, FNP  oxyCODONE-acetaminophen (PERCOCET/ROXICET) 5-325 MG tablet Take 1 tablet by mouth every 4 (four) hours as needed for severe pain.    [provider]  pramipexole (MIRAPEX) 1.5 MG tablet TAKE 1 TABLET BY MOUTH EVERY EVENING FOR RLS 12/04/16   Hubbard Hartshorn, FNP  pregabalin (LYRICA) 50 MG  capsule Take 50 mg by mouth at bedtime.     [provider]  ranitidine (ZANTAC) 150 MG tablet Take 1 tablet (150 mg total) by mouth at bedtime. 12/04/16   Hubbard Hartshorn, FNP  traZODone (DESYREL) 150 MG tablet Take 1 tablet (150 mg total) by mouth at bedtime as needed for sleep. Patient not taking: Reported on 01/08/2017 10/22/16   Elvin So, MD  triamcinolone (NASACORT ALLERGY 24HR) 55 MCG/ACT AERO nasal inhaler Place 2 sprays into the nose at bedtime. 12/16/16   Steele Sizer, MD  venlafaxine XR (EFFEXOR-XR) 150 MG 24 hr capsule Take 1 capsule (150 mg total) by mouth daily. Patient taking differently: Take 150 mg by mouth at bedtime.  12/04/16 12/04/17  Hubbard Hartshorn, FNP  vitamin B-12 (CYANOCOBALAMIN) 1000 MCG tablet Take 1,000 mcg by mouth daily.    [provider]  Vitamin D, Ergocalciferol, (DRISDOL) 50000 units CAPS capsule Take 50,000 Units by mouth every 7 (seven) days. [facility]    [provider]    Allergies Penicillins; Chlorhexidine gluconate; Ciprofloxacin; Clindamycin/lincomycin; Erythromycin; Keflex [cephalexin]; Nitrofurantoin monohyd macro; Other; Sulfa antibiotics; Tetracyclines & related; Adhesive [tape]; Latex; and Vancomycin  Family History  Problem Relation Age of Onset  . Cancer Paternal Grandmother     Social History Social History  Substance Use Topics  . Smoking status: Former Smoker    Packs/day: 0.75    Years: 2.00    Types: Cigarettes    Start date: 07/06/1981    Quit date: 07/07/1983  . Smokeless tobacco: Never Used  . Alcohol use No    Review of Systems Constitutional: No fever/chills Eyes: No visual changes. ENT: Positive for sore throat.  Cardiovascular: Denies chest pain. Respiratory: Denies shortness of breath. Gastrointestinal: No abdominal pain.  Positive for nausea.  Genitourinary: Negative for dysuria. Musculoskeletal: Negative for back pain. Skin: Positive for rash. Neurological: Negative for headaches, focal  weakness or numbness.  ____________________________________________   PHYSICAL EXAM:  VITAL SIGNS: ED Triage Vitals  Enc Vitals Group     BP 01/15/17 0914 110/61     Pulse Rate 01/15/17 0914 83     Resp 01/15/17 0914 18     Temp 01/15/17 0914 98.7 F (37.1 C)     Temp Source 01/15/17 0914 Oral     SpO2 01/15/17 0914 100 %     Weight 01/15/17 0915 232 lb (105.2 kg)     Height 01/15/17 0915 5\' 5"  (1.651 m)     Head Circumference --      Peak Flow --      Pain Score 01/15/17 0914 0   Constitutional: Alert and oriented. Well appearing and in no distress. Eyes: Conjunctivae are normal.  ENT   Head: Normocephalic and atraumatic.   Nose: No congestion/rhinnorhea.   Mouth/Throat: Mucous membranes are moist.   Neck: No stridor. Hematological/Lymphatic/Immunilogical: No cervical lymphadenopathy. Cardiovascular: Normal rate, regular rhythm.  No murmurs, rubs, or gallops.  Respiratory: Normal respiratory effort without tachypnea nor retractions. Breath sounds are clear and equal bilaterally. No wheezes/rales/rhonchi. Gastrointestinal: Soft and non  tender. No rebound. No guarding.  Genitourinary: Deferred Musculoskeletal: Normal range of motion in all extremities. No lower extremity edema. Neurologic:  Normal speech and language. No gross focal neurologic deficits are appreciated.  Skin:  Skin is warm, dry and intact. No rash noted. Psychiatric: Mood and affect are normal. Speech and behavior are normal. Patient exhibits appropriate insight and judgment.  ____________________________________________    LABS (pertinent positives/negatives)  Labs Reviewed - No data to display   ____________________________________________   EKG  None  ____________________________________________    RADIOLOGY  None   ____________________________________________   PROCEDURES  Procedures  ____________________________________________   INITIAL IMPRESSION / ASSESSMENT  AND PLAN / ED COURSE  Pertinent labs & imaging results that were available during my care of the patient were reviewed by me and considered in my medical decision making (see chart for details).  Patient presented to the emergency department today because of concerns for possible allergic reaction. Patient did complain of some throat issues. I do wonder if it could be secondary to operation yesterday. No obvious hives on my exam. Patient was given Solu-Medrol did feel some improvement. His also given some IV fluids. Will plan on discharging with further steroids. This point I do doubt allergic reaction. Think more likely secondary to surgical procedure.  ____________________________________________   FINAL CLINICAL IMPRESSION(S) / ED DIAGNOSES  Final diagnoses:  Swelling     Note: This dictation was prepared with Dragon dictation. Any transcriptional errors that result from this process are unintentional     Nance Pear, MD 01/15/17 1531

## 2017-01-15 NOTE — Discharge Instructions (Signed)
Please seek medical attention for any high fevers, chest pain, shortness of breath, change in behavior, persistent vomiting, bloody stool or any other new or concerning symptoms.  

## 2017-01-15 NOTE — ED Triage Notes (Signed)
Pt to ed with c/o "allergic reaction"  Pt states she had knee surgery yesterday and right after surgery felt like her tongue was swollen and that she had some swelling to face.  Pt reports symptoms did not resolve during the night.  Reports this am she called nurse line and they told her to take 50 mg benadryl at 4 am.  Pt reports she feels as if her face is swollen now.  No difficulty breathing, no difficulty swallowing at this time,  sats 100% in no resp distress at this time.

## 2017-01-15 NOTE — ED Notes (Signed)
Pt ambulated with cane with this RN present with no problems. Slight limp noted. Pt c/o dizziness. Pt steady on feet. MD made aware

## 2017-01-15 NOTE — ED Notes (Signed)
Pt states post knee surgery yesterday, was "put to sleep with tube down my throat", states that last night started having some facial swelling, itching, throat swelling and difficulty swallowing and some shortness of breath. Pt took some benedryl last night and this am with no change. VSS.   Pt moved to ER room 15 and report was given via phone to [place], Therapist, sports. Charge RN aware.

## 2017-01-20 ENCOUNTER — Ambulatory Visit: Payer: 59 | Admitting: Psychiatry

## 2017-01-21 ENCOUNTER — Ambulatory Visit: Payer: 59 | Admitting: Psychiatry

## 2017-01-25 ENCOUNTER — Other Ambulatory Visit: Payer: Self-pay | Admitting: Family Medicine

## 2017-01-25 DIAGNOSIS — G2581 Restless legs syndrome: Secondary | ICD-10-CM

## 2017-01-25 DIAGNOSIS — R61 Generalized hyperhidrosis: Secondary | ICD-10-CM

## 2017-01-29 ENCOUNTER — Other Ambulatory Visit: Payer: Self-pay | Admitting: Family Medicine

## 2017-01-29 NOTE — Telephone Encounter (Signed)
Patient requesting refill of Vitamin D to [place].

## 2017-01-31 ENCOUNTER — Other Ambulatory Visit: Payer: Self-pay | Admitting: Family Medicine

## 2017-01-31 DIAGNOSIS — G2581 Restless legs syndrome: Secondary | ICD-10-CM

## 2017-01-31 DIAGNOSIS — R61 Generalized hyperhidrosis: Secondary | ICD-10-CM

## 2017-02-02 ENCOUNTER — Ambulatory Visit (INDEPENDENT_AMBULATORY_CARE_PROVIDER_SITE_OTHER): Payer: 59 | Admitting: Psychiatry

## 2017-02-02 ENCOUNTER — Encounter: Payer: Self-pay | Admitting: Psychiatry

## 2017-02-02 VITALS — BP 129/73 | HR 89 | Temp 98.2°F | Wt 239.8 lb

## 2017-02-02 DIAGNOSIS — F331 Major depressive disorder, recurrent, moderate: Secondary | ICD-10-CM | POA: Diagnosis not present

## 2017-02-02 DIAGNOSIS — F411 Generalized anxiety disorder: Secondary | ICD-10-CM

## 2017-02-02 MED ORDER — VENLAFAXINE HCL ER 150 MG PO CP24: 150.0000 mg | ORAL_CAPSULE | Freq: Every day | ORAL | 0 refills | Status: DC

## 2017-02-02 NOTE — Progress Notes (Signed)
Psychiatric Progress note  Patient Identification: Kelly [name] MRN:  [id number] Date of Evaluation:  02/02/2017 Referral Source: Dr.Sowles Chief Complaint:   Chief Complaint    Follow-up; Medication Refill    depression, anxiety Visit Diagnosis:    ICD-10-CM   1. MDD (major depressive disorder), recurrent episode, moderate (HCC) F33.1   2. GAD (generalized anxiety disorder) F41.1   3. Moderate recurrent major depression (HCC) F33.1 venlafaxine XR (EFFEXOR-XR) 150 MG 24 hr capsule    History of Present Illness:  Patient is a 51 year old Caucasian female who Is seen today for a follow-up off for depression and anxiety. Patient reports that she had a lot of stress. She had a fall and tore her meniscus on her knee. Stress from the court date from their adopted children. States that she is not taking any trazodone since she is on watch for her husband who has some dizziness. She has been taking some hydroxyzine as needed twice daily to help with her anxiety. Denies any suicidal thoughts. She is continuing to take classes and doing quite well. Continues to have A grades. Past Psychiatric History: none, denies any suicidal thoughts  Previous Psychotropic Medications: Yes   Substance Abuse History in the last 12 months:  No.  Consequences of Substance Abuse: Negative  Past Medical History:  Past Medical History:  Diagnosis Date  . Allergy   . Anxiety   . Depression   . GERD (gastroesophageal reflux disease)   . History of kidney stones   . Insomnia   . Intermittent low back pain   . Migraines   . Osteoarthritis   . Recurrent UTI   . Restless leg syndrome   . Sciatica of right side   . Symptomatic menopausal or female climacteric states   . Vertigo   . Vitamin D deficiency     Past Surgical History:  Procedure Laterality Date  . BACK SURGERY    . DIAGNOSTIC LAPAROSCOPY    . DILATION AND CURETTAGE OF UTERUS    . KNEE ARTHROSCOPY WITH MEDIAL MENISECTOMY Left 01/14/2017    Procedure: KNEE ARTHROSCOPY WITH MEDIAL AND LATERAL MENISECTOMY;  Surgeon: Hessie Knows, MD;  Location: [facility] OR[facility];  Service: Orthopedics;  Laterality: Left;  Partial Knee menisectomy  . KNEE SURGERY Left 05/2011   Dr. Rudene Christians- Arthroscopic  . LUMBAR LAMINECTOMY/DECOMPRESSION MICRODISCECTOMY N/A 09/16/2016   Procedure: LUMBAR LAMINECTOMY/DECOMPRESSION MICRODISCECTOMY 1 LEVEL L5-S1;  Surgeon: Meade Maw, MD;  Location: [facility] OR[facility];  Service: Neurosurgery;  Laterality: N/A;  . TONSILLECTOMY AND ADENOIDECTOMY    . URETHRAL STRICTURE DILATATION      Family Psychiatric History: on father`s side aunts and uncles, cousins committed suicide (25 of them)  Family History:  Family History  Problem Relation Age of Onset  . Cancer Paternal Grandmother     Social History:   Social History   Social History  . Marital status: Married    Spouse name: N/A  . Number of children: N/A  . Years of education: N/A   Social History Main Topics  . Smoking status: Former Smoker    Packs/day: 0.75    Years: 2.00    Types: Cigarettes    Start date: 07/06/1981    Quit date: 07/07/1983  . Smokeless tobacco: Never Used  . Alcohol use No  . Drug use: No  . Sexual activity: Yes    Partners: Male   Other Topics Concern  . None   Social History Narrative  . None    Additional Social History: Lives with  husband and 2 daughters and her mom.  Allergies:   Allergies  Allergen Reactions  . Penicillins Anaphylaxis, Hives, Nausea And Vomiting and Swelling    Has patient had a PCN reaction causing immediate rash, facial/tongue/throat swelling, SOB or lightheadedness with hypotension: Yes Has patient had a PCN reaction causing severe rash involving mucus membranes or skin necrosis: Yes Has patient had a PCN reaction that required hospitalization No Has patient had a PCN reaction occurring within the last 10 years: Yes If all of the above answers are "NO", then may proceed with Cephalosporin use.   .  Chlorhexidine Gluconate Itching and Rash  . Ciprofloxacin Hives  . Clindamycin/Lincomycin Hives  . Erythromycin Hives and Nausea And Vomiting  . Keflex [Cephalexin] Hives  . Nitrofurantoin Monohyd Macro Hives and Nausea And Vomiting  . Other Hives  . Sulfa Antibiotics Hives and Nausea And Vomiting    Rapid heart rate  . Tetracyclines & Related Hives  . Adhesive [Tape] Rash  . Latex Rash  . Vancomycin Itching and Rash    Metabolic Disorder Labs: Lab Results  Component Value Date   HGBA1C 5.6 12/31/2016   MPG 114 12/31/2016   MPG 111 02/27/2016   No results found for: PROLACTIN Lab Results  Component Value Date   CHOL 139 12/31/2016   TRIG 111 12/31/2016   HDL 53 12/31/2016   CHOLHDL 2.6 12/31/2016   VLDL 22 12/31/2016   LDLCALC 64 12/31/2016   LDLCALC 74 02/27/2016     Current Medications: Current Outpatient Prescriptions  Medication Sig Dispense Refill  . cloNIDine (CATAPRES) 0.1 MG tablet TAKE 1 TABLET (0.1 MG TOTAL) BY MOUTH AT BEDTIME AS NEEDED. 90 tablet 0  . HYDROcodone-acetaminophen (NORCO) 5-325 MG tablet Take 1-2 tablets by mouth every 6 (six) hours as needed for moderate pain. 30 tablet 0  . hydrOXYzine (ATARAX/VISTARIL) 10 MG tablet Take 1 tablet (10 mg total) by mouth 2 (two) times daily as needed. 30 tablet 0  . loratadine (CLARITIN) 10 MG tablet Take 1 tablet (10 mg total) by mouth daily. 90 tablet 5  . metaxalone (SKELAXIN) 800 MG tablet Take 800 mg by mouth 3 (three) times daily.    Marland Kitchen omeprazole (PRILOSEC) 40 MG capsule Take 1 capsule (40 mg total) by mouth every morning. 90 capsule 5  . oxyCODONE-acetaminophen (PERCOCET/ROXICET) 5-325 MG tablet Take 1 tablet by mouth every 4 (four) hours as needed for severe pain.    . pramipexole (MIRAPEX) 1.5 MG tablet TAKE 1 TABLET BY MOUTH EVERY EVENING FOR RLS 90 tablet 0  . predniSONE (DELTASONE) 20 MG tablet Take 2 tablets (40 mg total) by mouth daily. 8 tablet 0  . pregabalin (LYRICA) 50 MG capsule Take 50 mg by  mouth at bedtime.     . ranitidine (ZANTAC) 150 MG tablet Take 1 tablet (150 mg total) by mouth at bedtime. 90 tablet 5  . traZODone (DESYREL) 150 MG tablet Take 1 tablet (150 mg total) by mouth at bedtime as needed for sleep. 30 tablet 1  . triamcinolone (NASACORT ALLERGY 24HR) 55 MCG/ACT AERO nasal inhaler Place 2 sprays into the nose at bedtime. 1 Inhaler 5  . venlafaxine XR (EFFEXOR-XR) 150 MG 24 hr capsule Take 1 capsule (150 mg total) by mouth at bedtime. 90 capsule 0  . Vitamin D, Ergocalciferol, (DRISDOL) 50000 units CAPS capsule Take 50,000 Units by mouth every 7 (seven) days. WEDNESDAY     No current facility-administered medications for this visit.     Neurologic: Headache: Negative  Seizure: No Paresthesias:No  Musculoskeletal: Strength & Muscle Tone: within normal limits Gait & Station: normal Patient leans: N/A  Psychiatric Specialty Exam: ROS  Blood pressure 129/73, pulse 89, temperature 98.2 F (36.8 C), temperature source Oral, weight 239 lb 12.8 oz (108.8 kg), last menstrual period 01/13/2017.Body mass index is 39.9 kg/m.  General Appearance: Casual  Eye Contact:  Fair  Speech:  Clear and Coherent  Volume:  Normal  Mood:  improved  Affect:  congruent  Thought Process:  Coherent  Orientation:  Full (Time, Place, and Person)  Thought Content:  WDL  Suicidal Thoughts:  No  Homicidal Thoughts:  No  Memory:  Immediate;   Fair Recent;   Fair Remote;   Fair  Judgement:  Fair  Insight:  Fair  Psychomotor Activity:  Normal  Concentration:  Concentration: Fair and Attention Span: Fair  Recall:  [employer] of Knowledge:Fair  Language: Fair  Akathisia:  No  Handed:  Right  AIMS (if indicated):  na  Assets:  Communication Skills Desire for Improvement Financial Resources/Insurance Housing Resilience Social Support  ADL's:  Intact  Cognition: WNL  Sleep:  fair    Treatment Plan Summary:  Major depressive disorder recurrent  Continue effexor at 150mg   po qd. Continue to see therapist.  Insomnia Not taking the trazodone currently.   Anxiety Taking hydroxyzine at 10mg  po bid as needed for anxiety.  Return to Clinic in 3 months  time or call before if necessary    Elvin So, MD 7/31/20181:59 PM

## 2017-02-06 ENCOUNTER — Encounter: Payer: Self-pay | Admitting: Emergency Medicine

## 2017-02-06 ENCOUNTER — Emergency Department
Admission: EM | Admit: 2017-02-06 | Discharge: 2017-02-06 | Disposition: A | Discharge status: Home / Self Care | Payer: 59 | Attending: Emergency Medicine | Admitting: Emergency Medicine

## 2017-02-06 DIAGNOSIS — S60862A Insect bite (nonvenomous) of left wrist, initial encounter: Secondary | ICD-10-CM | POA: Diagnosis not present

## 2017-02-06 DIAGNOSIS — T63441A Toxic effect of venom of bees, accidental (unintentional), initial encounter: Secondary | ICD-10-CM

## 2017-02-06 DIAGNOSIS — Z79899 Other long term (current) drug therapy: Secondary | ICD-10-CM | POA: Diagnosis not present

## 2017-02-06 DIAGNOSIS — Y999 Unspecified external cause status: Secondary | ICD-10-CM | POA: Diagnosis not present

## 2017-02-06 DIAGNOSIS — Y939 Activity, unspecified: Secondary | ICD-10-CM | POA: Insufficient documentation

## 2017-02-06 DIAGNOSIS — Y929 Unspecified place or not applicable: Secondary | ICD-10-CM | POA: Insufficient documentation

## 2017-02-06 DIAGNOSIS — W57XXXA Bitten or stung by nonvenomous insect and other nonvenomous arthropods, initial encounter: Secondary | ICD-10-CM | POA: Diagnosis not present

## 2017-02-06 DIAGNOSIS — R21 Rash and other nonspecific skin eruption: Secondary | ICD-10-CM

## 2017-02-06 DIAGNOSIS — Z87891 Personal history of nicotine dependence: Secondary | ICD-10-CM | POA: Insufficient documentation

## 2017-02-06 DIAGNOSIS — L03119 Cellulitis of unspecified part of limb: Secondary | ICD-10-CM

## 2017-02-06 MED ORDER — PREDNISONE 10 MG PO TABS
ORAL_TABLET | ORAL | 0 refills | Status: DC
Start: 2017-02-06 — End: 2017-06-21

## 2017-02-06 MED ORDER — AZITHROMYCIN 250 MG PO TABS: ORAL_TABLET | ORAL | 0 refills | Status: DC

## 2017-02-06 NOTE — ED Provider Notes (Signed)
[facility] Emergency Department Provider Note  ____________________________________________   First MD Initiated Contact with Patient 02/06/17 1150     (approximate)  I have reviewed the triage vital signs and the nursing notes.   HISTORY  Chief Complaint Insect Bite   HPI Kelly [name] is a 51 y.o. female is here with complaint of bee sting to her left wrist last night that has become painful and swollen. She denies any shortness of breath or difficulty swallowing. Patient states that she routinely takes Benadryl because of her multiple allergies. She also has an EpiPen that she has not had to use. She states that the area is now red, hot and spreading. She also is concerned of a rash and questionable hives. Patient states that she is allergic to most antibiotics and has a history of urticaria. She also is concerned about her left knee that she had surgery on and whether or not this is becoming infected. She is unaware of any fever or chills. She rates her pain as 5/10.   Past Medical History:  Diagnosis Date  . Allergy   . Anxiety   . Depression   . GERD (gastroesophageal reflux disease)   . History of kidney stones   . Insomnia   . Intermittent low back pain   . Migraines   . Osteoarthritis   . Recurrent UTI   . Restless leg syndrome   . Sciatica of right side   . Symptomatic menopausal or female climacteric states   . Vertigo   . Vitamin D deficiency     Patient Active Problem List   Diagnosis Date Noted  . Spondylolisthesis of lumbosacral region 02/11/2016  . Hematuria 04/09/2015  . Hyperglycemia 04/06/2015  . Right lumbar radiculitis 12/28/2014  . Moderate recurrent major depression ([place]) 12/27/2014  . Gastro-esophageal reflux disease without esophagitis 12/27/2014  . Bulge of lumbar disc without myelopathy 12/27/2014  . Dysmetabolic syndrome 58/85/0277  . Migraine without aura and responsive to treatment 12/27/2014  .  Osteoarthrosis 12/27/2014  . Obesity (BMI 30-39.9) 12/27/2014  . Restless leg 12/27/2014  . Allergic rhinitis, seasonal 12/27/2014    Past Surgical History:  Procedure Laterality Date  . BACK SURGERY    . DIAGNOSTIC LAPAROSCOPY    . DILATION AND CURETTAGE OF UTERUS    . KNEE ARTHROSCOPY WITH MEDIAL MENISECTOMY Left 01/14/2017   Procedure: KNEE ARTHROSCOPY WITH MEDIAL AND LATERAL MENISECTOMY;  Surgeon: Hessie Knows, MD;  Location: [facility] ORS;  Service: Orthopedics;  Laterality: Left;  Partial Knee menisectomy  . KNEE SURGERY Left 05/2011   Dr. Rudene Christians- Arthroscopic  . LUMBAR LAMINECTOMY/DECOMPRESSION MICRODISCECTOMY N/A 09/16/2016   Procedure: LUMBAR LAMINECTOMY/DECOMPRESSION MICRODISCECTOMY 1 LEVEL L5-S1;  Surgeon: Meade Maw, MD;  Location: [facility] ORS;  Service: Neurosurgery;  Laterality: N/A;  . TONSILLECTOMY AND ADENOIDECTOMY    . URETHRAL STRICTURE DILATATION      Prior to Admission medications   Medication Sig Start Date End Date Taking? Authorizing Provider  azithromycin (ZITHROMAX Z-PAK) 250 MG tablet Take 2 tablets (500 mg) on  Day 1,  followed by 1 tablet (250 mg) once daily on Days 2 through 5. 02/06/17   Johnn Hai, PA-C  cloNIDine (CATAPRES) 0.1 MG tablet TAKE 1 TABLET (0.1 MG TOTAL) BY MOUTH AT BEDTIME AS NEEDED. 12/04/16   Hubbard Hartshorn, FNP  HYDROcodone-acetaminophen (NORCO) 5-325 MG tablet Take 1-2 tablets by mouth every 6 (six) hours as needed for moderate pain. 01/14/17   Hessie Knows, MD  hydrOXYzine (ATARAX/VISTARIL) 10  MG tablet Take 1 tablet (10 mg total) by mouth 2 (two) times daily as needed. 01/25/17   Steele Sizer, MD  loratadine (CLARITIN) 10 MG tablet Take 1 tablet (10 mg total) by mouth daily. 12/04/16   Hubbard Hartshorn, FNP  metaxalone (SKELAXIN) 800 MG tablet Take 800 mg by mouth 3 (three) times daily.    [provider]  omeprazole (PRILOSEC) 40 MG capsule Take 1 capsule (40 mg total) by mouth every morning. 12/04/16   Hubbard Hartshorn, FNP    oxyCODONE-acetaminophen (PERCOCET/ROXICET) 5-325 MG tablet Take 1 tablet by mouth every 4 (four) hours as needed for severe pain.    [provider]  pramipexole (MIRAPEX) 1.5 MG tablet TAKE 1 TABLET BY MOUTH EVERY EVENING FOR RLS 12/04/16   Hubbard Hartshorn, FNP  predniSONE (DELTASONE) 10 MG tablet Take 6 tablets  today, on day 2 take 5 tablets, day 3 take 4 tablets, day 4 take 3 tablets, day 5 take  2 tablets and 1 tablet the last day 02/06/17   Johnn Hai, PA-C  pregabalin (LYRICA) 50 MG capsule Take 50 mg by mouth at bedtime.     [provider]  ranitidine (ZANTAC) 150 MG tablet Take 1 tablet (150 mg total) by mouth at bedtime. 12/04/16   Hubbard Hartshorn, FNP  traZODone (DESYREL) 150 MG tablet Take 1 tablet (150 mg total) by mouth at bedtime as needed for sleep. 10/22/16   Elvin So, MD  triamcinolone (NASACORT ALLERGY 24HR) 55 MCG/ACT AERO nasal inhaler Place 2 sprays into the nose at bedtime. 12/16/16   Steele Sizer, MD  venlafaxine XR (EFFEXOR-XR) 150 MG 24 hr capsule Take 1 capsule (150 mg total) by mouth at bedtime. 02/02/17 02/02/18  Elvin So, MD  Vitamin D, Ergocalciferol, (DRISDOL) 50000 units CAPS capsule Take 50,000 Units by mouth every 7 (seven) days. [facility]    [provider]    Allergies Penicillins; Chlorhexidine gluconate; Ciprofloxacin; Clindamycin/lincomycin; Erythromycin; Keflex [cephalexin]; Nitrofurantoin monohyd macro; Other; Sulfa antibiotics; Tetracyclines & related; Adhesive [tape]; Latex; and Vancomycin  Family History  Problem Relation Age of Onset  . Cancer Paternal Grandmother     Social History Social History  Substance Use Topics  . Smoking status: Former Smoker    Packs/day: 0.75    Years: 2.00    Types: Cigarettes    Start date: 07/06/1981    Quit date: 07/07/1983  . Smokeless tobacco: Never Used  . Alcohol use No    Review of Systems  Constitutional: No fever/chills ENT: No throat  complaints. Cardiovascular: Denies chest pain. Respiratory: Denies shortness of breath. Gastrointestinal: No abdominal pain.  No nausea, no vomiting.   Musculoskeletal: Negative for back pain. Skin: Positive left wrist redness. Questionable infection left knee. Neurological: Negative for headaches, focal weakness or numbness.   ____________________________________________   PHYSICAL EXAM:  VITAL SIGNS: ED Triage Vitals [02/06/17 1111]  Enc Vitals Group     BP      Pulse      Resp      Temp      Temp src      SpO2      Weight      Height      Head Circumference      Peak Flow      Pain Score 5     Pain Loc      Pain Edu?      Excl. in [place]?    Constitutional: Alert and oriented. Well appearing and  in no acute distress. Eyes: Conjunctivae are normal.  Head: Atraumatic. Nose: No congestion/rhinnorhea. Mouth/Throat: Mucous membranes are moist.  Oropharynx non-erythematous. No edema noted Neck: No stridor. Hematological/Lymphatic/Immunilogical: No cervical lymphadenopathy. Cardiovascular: Normal rate, regular rhythm. Grossly normal heart sounds.  Good peripheral circulation. Respiratory: Normal respiratory effort.  No retractions. Lungs CTAB.  Musculoskeletal: Moves upper and lower strength without any difficulty. Normal gait was noted. Neurologic:  Normal speech and language. No gross focal neurologic deficits are appreciated. No gait instability. Skin:  Skin is warm, dry.  Examination of the left wrist there is moderate erythema and warmth. Area is tender to touch. Area appears to be cellulitic and is on the volar aspect of the left wrist. Minimal soft tissue swelling at present. On examination of the left knee area appears to be healing without any signs of infection. No drainage from the incision sites from a recent arthroscopy. Psychiatric: Mood and affect are normal. Speech and behavior are normal.  ____________________________________________   LABS (all labs ordered  are listed, but only abnormal results are displayed)  Labs Reviewed - No data to display    PROCEDURES  Procedure(s) performed: None  Procedures  Critical Care performed: No  ____________________________________________   INITIAL IMPRESSION / ASSESSMENT AND PLAN / ED COURSE  Pertinent labs & imaging results that were available during my care of the patient were reviewed by me and considered in my medical decision making (see chart for details).  Patient was given a prednisone taper beginning x-rays 60 mg and tapering down over the next 6 days. She is also given a prescription for a Z-Pak. Patient states that she has taken Zithromax in the past without any allergic reaction. She is to apply warm compress to her left wrist. She'll follow-up with her primary care doctor next week or return to the emergency room if any severe worsening of her symptoms.   ____________________________________________   FINAL CLINICAL IMPRESSION(S) / ED DIAGNOSES  Final diagnoses:  Cellulitis of wrist  Bee sting, accidental or unintentional, initial encounter  Rash      NEW MEDICATIONS STARTED DURING THIS VISIT:  Discharge Medication List as of 02/06/2017 12:18 PM    START taking these medications   Details  azithromycin (ZITHROMAX Z-PAK) 250 MG tablet Take 2 tablets (500 mg) on  Day 1,  followed by 1 tablet (250 mg) once daily on Days 2 through 5., Print         Note:  This document was prepared using Dragon voice recognition software and may include unintentional dictation errors.    Johnn Hai, PA-C 02/06/17 1351    Delman Kitten, MD 02/06/17 [phone]

## 2017-02-06 NOTE — Discharge Instructions (Signed)
Follow-up with your primary care doctor for follow-up next week. Continue taking Benadryl as needed for itching. Prednisone starting today as directed. Begin Zithromax as directed. Use warm compress to your wrist. If severe worsening or fever return to the emergency room over the weekend.

## 2017-02-06 NOTE — ED Triage Notes (Signed)
Pt to ed with c/o "allergic reaction to a bee sting last night"  Pt reports she was stung by a bee last night on left wrist and not has swelling and pain.  Denies sob, denies throat swelling.  Pt is in no acute distress.

## 2017-02-17 ENCOUNTER — Ambulatory Visit: Payer: Self-pay | Admitting: Family Medicine

## 2017-02-22 ENCOUNTER — Ambulatory Visit: Payer: Self-pay | Admitting: Family Medicine

## 2017-03-24 ENCOUNTER — Ambulatory Visit: Payer: Self-pay | Admitting: Family Medicine

## 2017-03-30 ENCOUNTER — Other Ambulatory Visit: Payer: Self-pay | Admitting: Family Medicine

## 2017-03-30 DIAGNOSIS — R61 Generalized hyperhidrosis: Secondary | ICD-10-CM

## 2017-04-02 IMAGING — CT CT RENAL STONE PROTOCOL
2 of 4 series · 16 of 46 positions shown, 18 images · non-contrast
Comparison: None.

CLINICAL DATA: Right flank pain, some low back pain, microscopic
hematuria, history of kidney stone

EXAM:
CT ABDOMEN AND PELVIS WITHOUT CONTRAST
TECHNIQUE: Multidetector CT imaging of the abdomen and pelvis was performed
following the standard protocol without IV contrast.

[Series 2: stone · axial · 0.75mm/px · z∈[-956,-536]mm · 13 of 92 slices shown, 15 images]
[im 4/92  soft-tissue]
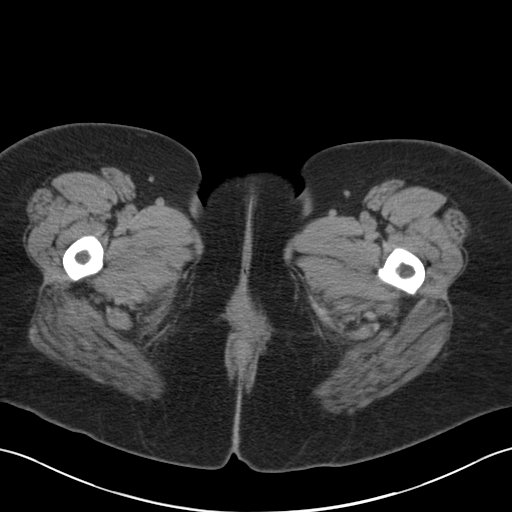
[im 4/92  bone]
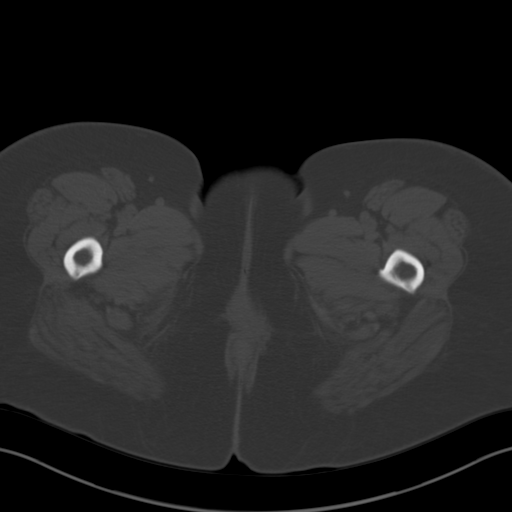
[im 12/92  soft-tissue]
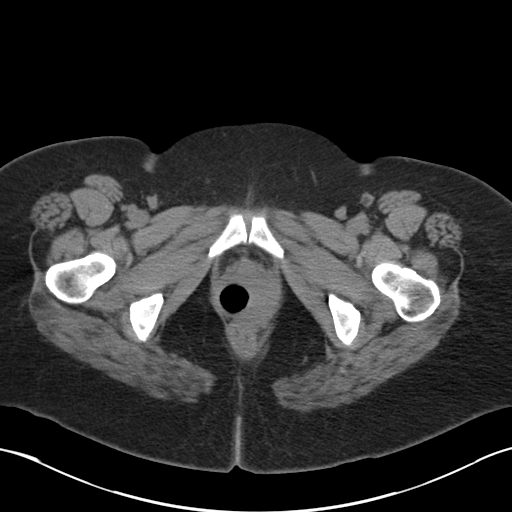
[im 19/92  soft-tissue]
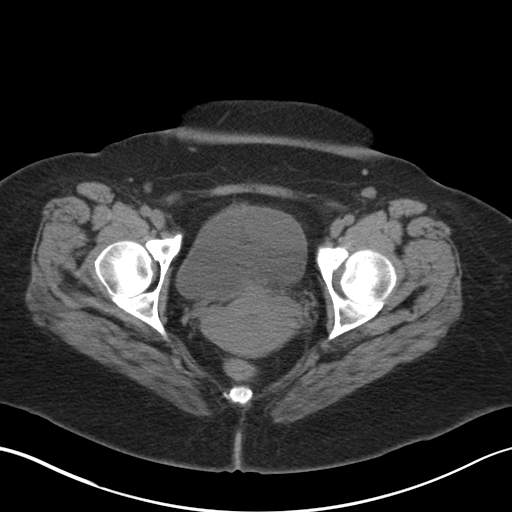
[im 27/92  soft-tissue]
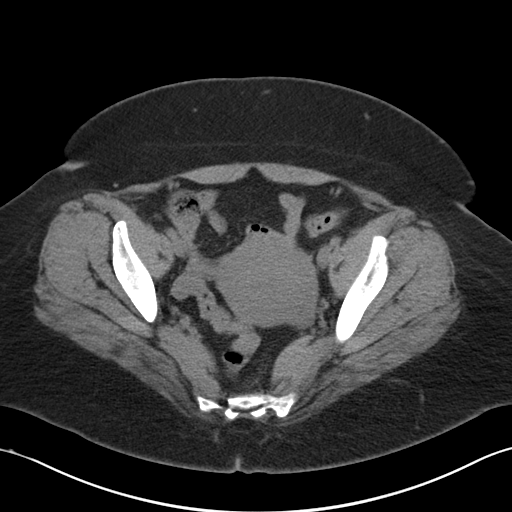
[im 31/92  soft-tissue]
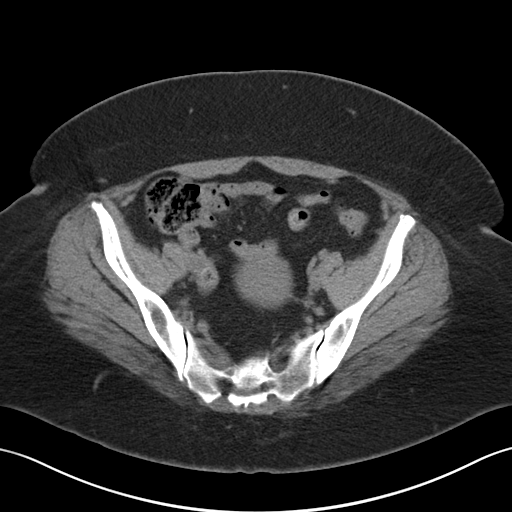
[im 38/92  soft-tissue]
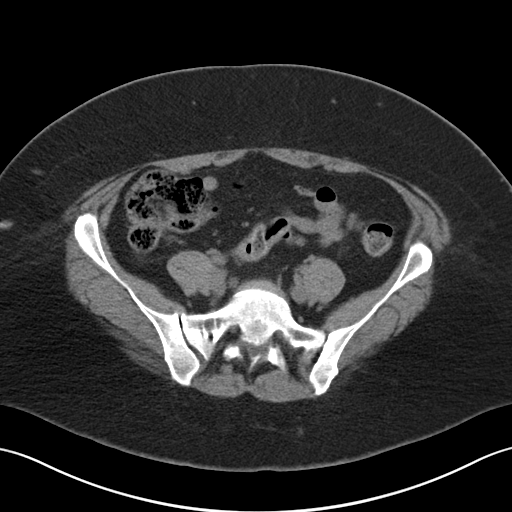
[im 46/92  soft-tissue]
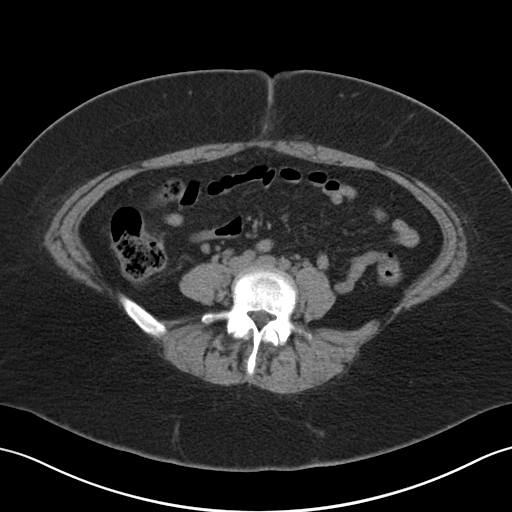
[im 54/92  soft-tissue]
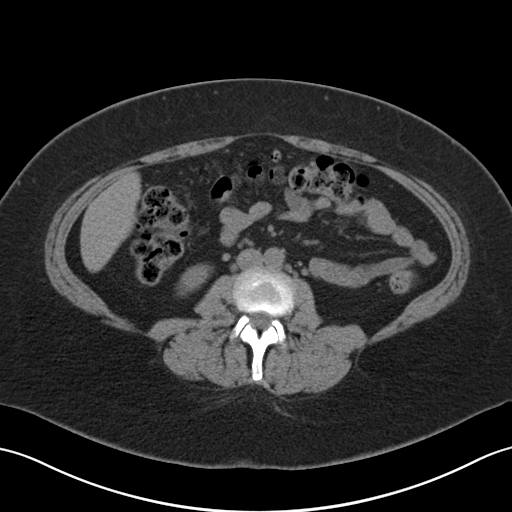
[im 61/92  soft-tissue]
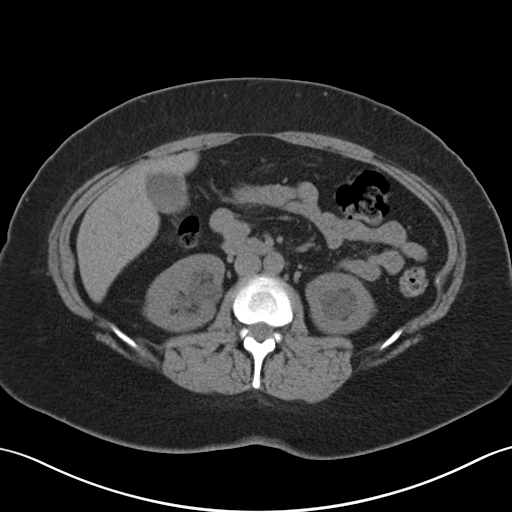
[im 61/92  bone]
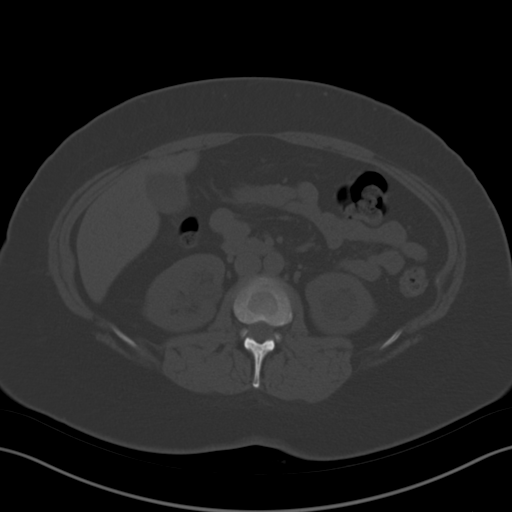
[im 65/92  soft-tissue]
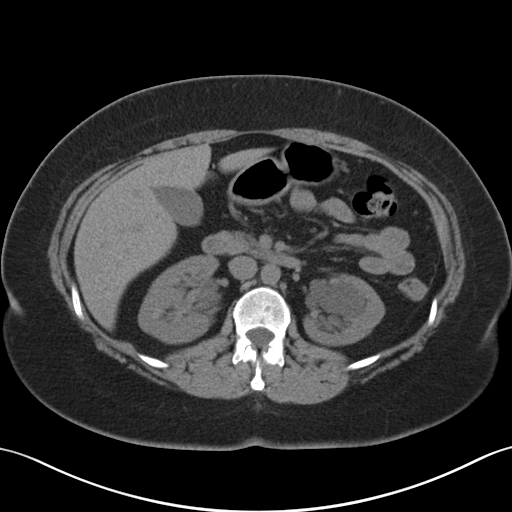
[im 73/92  soft-tissue]
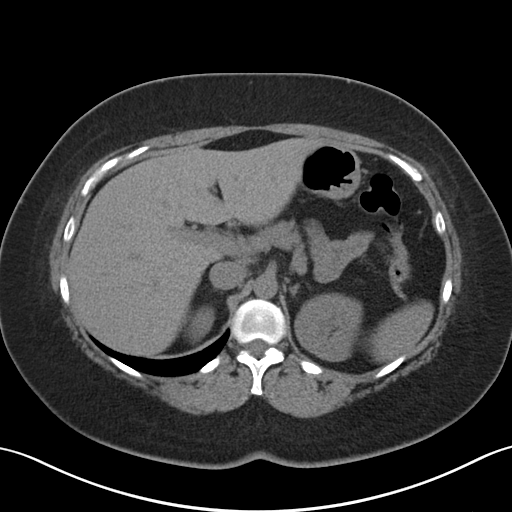
[im 80/92  soft-tissue]
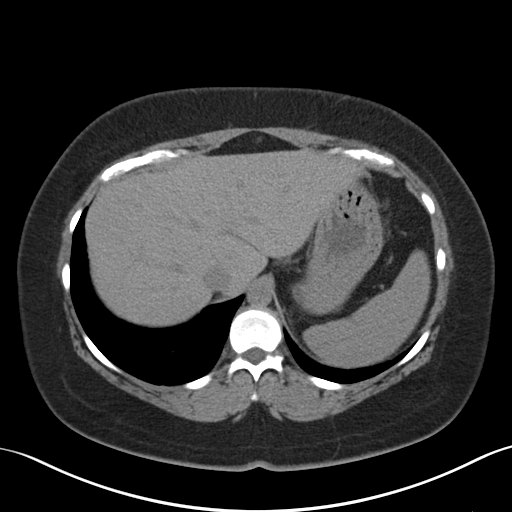
[im 88/92  soft-tissue]
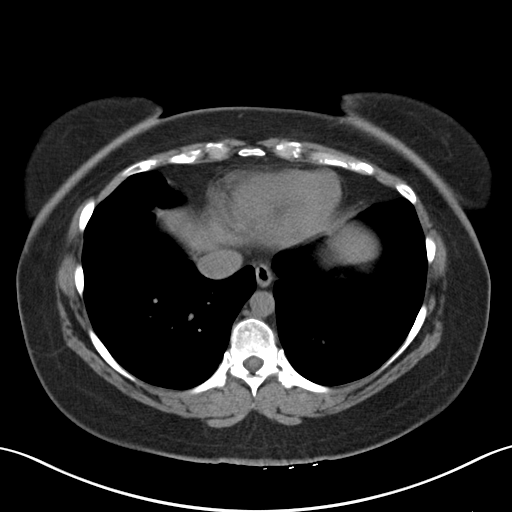

[Series 5: cor · coronal · 0.78mm/px · 3 of 160 slices shown]
[im 54/160  soft-tissue]
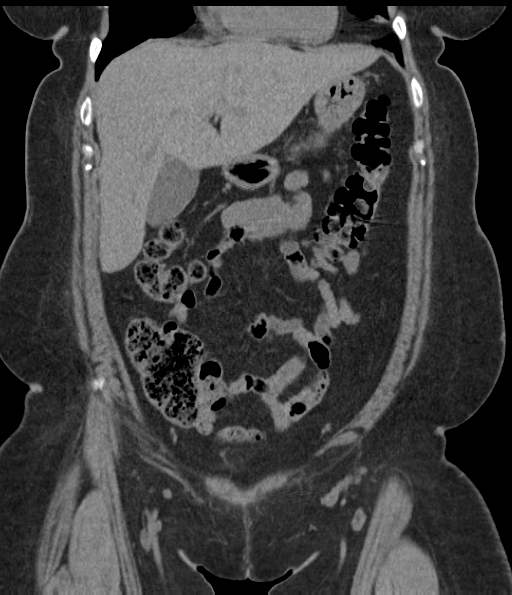
[im 71/160  soft-tissue]
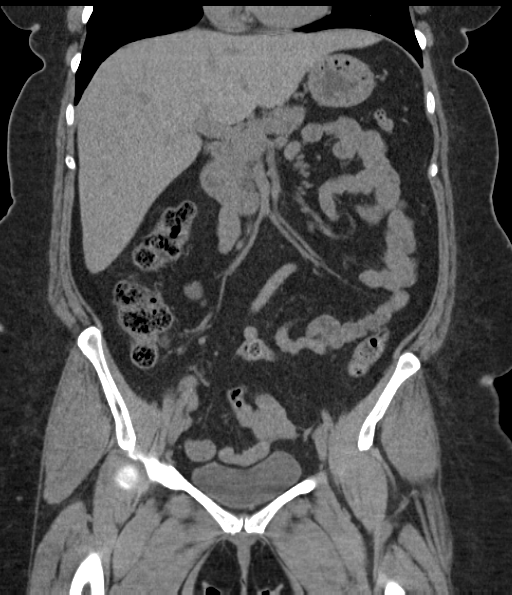
[im 89/160  soft-tissue]
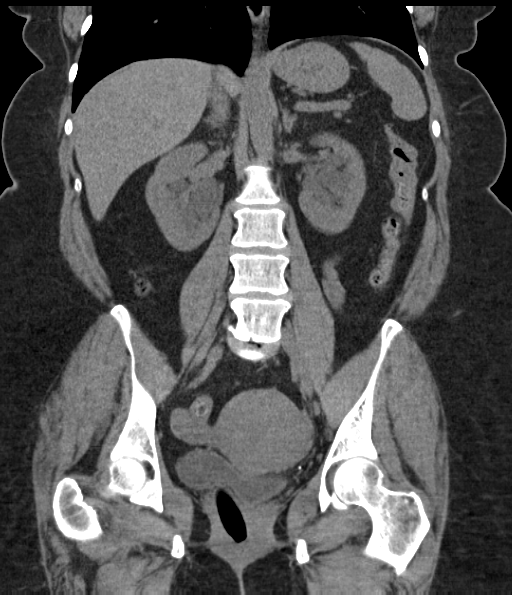

[16 of 46 positions shown; findings below may reference images not displayed]

FINDINGS: The lung bases are clear. The liver is unremarkable in the
unenhanced state. No calcified gallstones are seen. The pancreas is
normal in size on this unenhanced study and the pancreatic duct is
not dilated. The adrenal glands and spleen are unremarkable. The
stomach is decompressed. There is low-attenuation within both renal
collecting systems of which appears most typical of multiple
parapelvic renal cysts rather than hydronephrosis, not extending
into the calices. In addition, the proximal ureters are not dilated.
No renal calculi are noted. The abdominal aorta is normal in
caliber. No adenopathy is seen.

The distal ureters are not dilated and no distal ureteral calculus
is seen. The urinary bladder is not well distended but no
abnormality is noted. The uterus is normal in size. There do appear
to be small left ovarian follicles present. No free fluid is seen
within the pelvis. The colon is largely decompressed. No significant
colonic diverticula are noted. The terminal ileum is unremarkable.
The appendix is not definitely seen, but no inflammatory process is
noted. A tampon is noted within the vagina. Small groin nodes are
present. The lumbar vertebrae are in normal alignment. There is
degenerative disc disease particularly at L5-S1.
IMPRESSION: 1. Right flank pain is seen. No renal or ureteral calculi are noted.
2. Low-attenuation within both renal collecting systems is most
consistent with bilateral parapelvic cysts. CT with IV contrast
would be beneficial in confirming that finding.
3. No explanation for the patient's

## 2017-04-07 ENCOUNTER — Other Ambulatory Visit: Payer: Self-pay | Admitting: Neurosurgery

## 2017-04-07 DIAGNOSIS — M5416 Radiculopathy, lumbar region: Secondary | ICD-10-CM

## 2017-04-13 ENCOUNTER — Ambulatory Visit
Admission: RE | Admit: 2017-04-13 | Discharge: 2017-04-13 | Disposition: A | Discharge status: Home / Self Care | Payer: 59 | Source: Ambulatory Visit | Attending: Neurosurgery | Admitting: Neurosurgery

## 2017-04-13 DIAGNOSIS — M47817 Spondylosis without myelopathy or radiculopathy, lumbosacral region: Secondary | ICD-10-CM | POA: Diagnosis not present

## 2017-04-13 DIAGNOSIS — M4807 Spinal stenosis, lumbosacral region: Secondary | ICD-10-CM | POA: Insufficient documentation

## 2017-04-13 DIAGNOSIS — M5416 Radiculopathy, lumbar region: Secondary | ICD-10-CM

## 2017-04-13 DIAGNOSIS — M5116 Intervertebral disc disorders with radiculopathy, lumbar region: Secondary | ICD-10-CM | POA: Insufficient documentation

## 2017-04-13 DIAGNOSIS — M533 Sacrococcygeal disorders, not elsewhere classified: Secondary | ICD-10-CM | POA: Insufficient documentation

## 2017-04-13 MED ORDER — GADOBENATE DIMEGLUMINE 529 MG/ML IV SOLN
20.0000 mL | Freq: Once | INTRAVENOUS | Status: AC | PRN
  Administered 2017-04-13: 20 mL via INTRAVENOUS

## 2017-04-23 IMAGING — CT CT ABDOMEN WO/W CM
3 of 10 series · 13 of 46 positions shown, 19 images · IV contrast (omnipaque)
Comparison: CT abdomen pelvis dated 04/24/2015

CLINICAL DATA: Follow-up renal cysts

EXAM:
CT ABDOMEN WITHOUT AND WITH CONTRAST
TECHNIQUE: Multidetector CT imaging of the abdomen was performed following the
standard protocol before and following the bolus administration of
intravenous contrast.
CONTRAST:  100mL OMNIPAQUE IOHEXOL 350 MG/ML SOLN

[Series 2: renal without · axial · non-contrast · 0.62mm/px · z∈[-880,-666]mm · 7 of 143 slices shown, 12 images]
[im 18/143  soft-tissue]
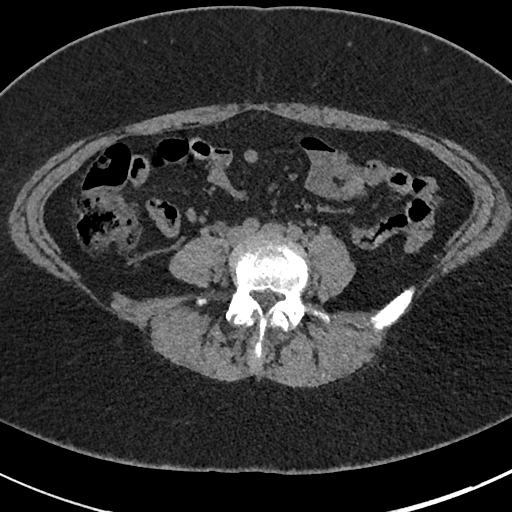
[im 18/143  bone]
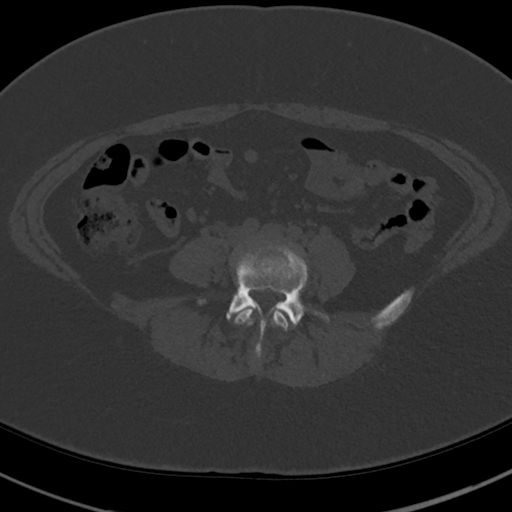
[im 36/143  soft-tissue]
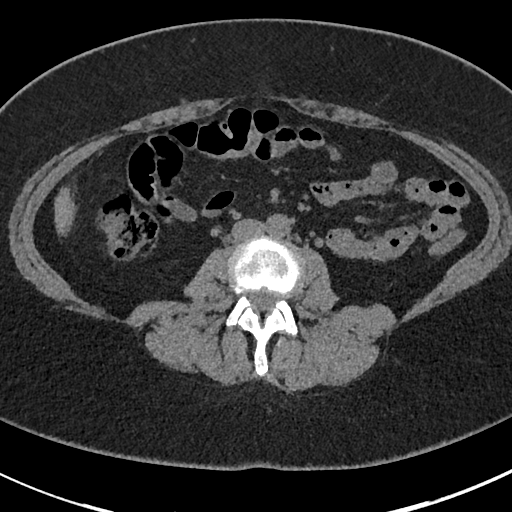
[im 54/143  soft-tissue]
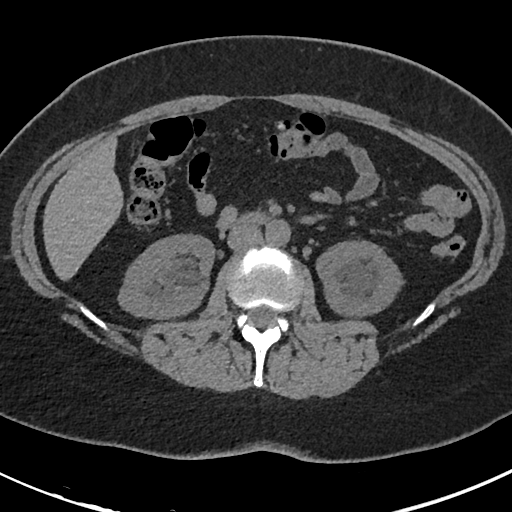
[im 72/143  soft-tissue]
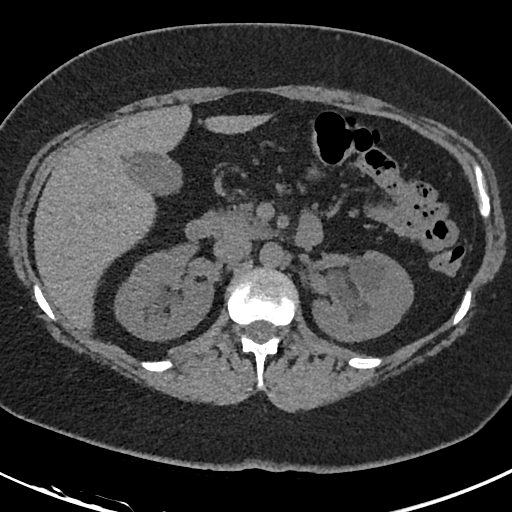
[im 72/143  lung]
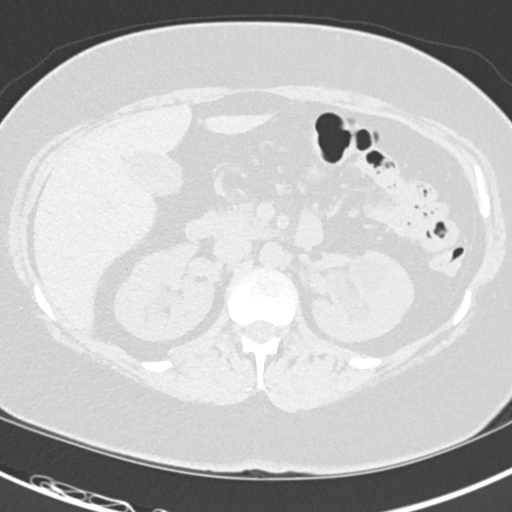
[im 89/143  soft-tissue]
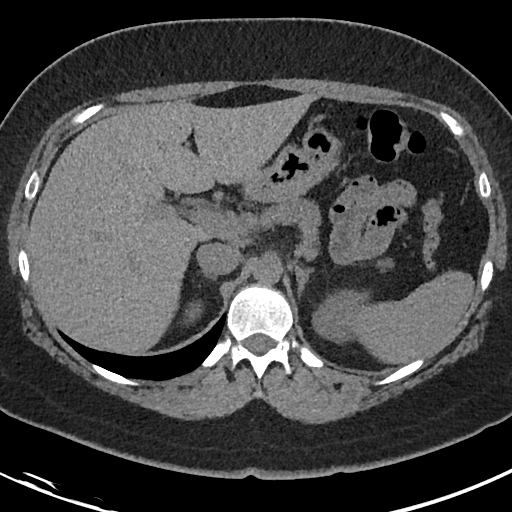
[im 89/143  lung]
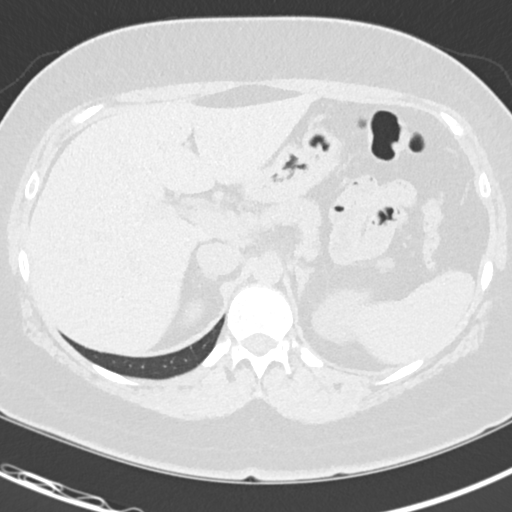
[im 107/143  soft-tissue]
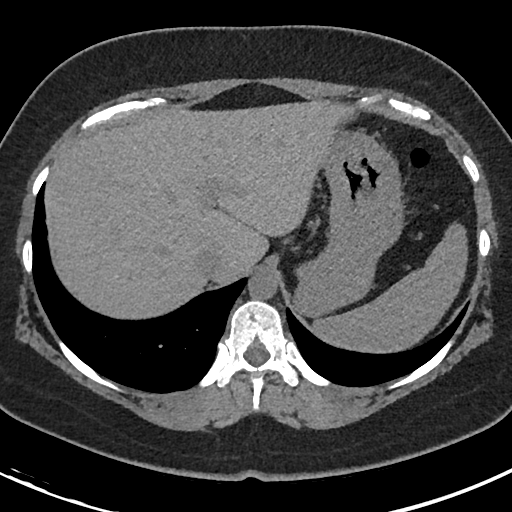
[im 107/143  lung]
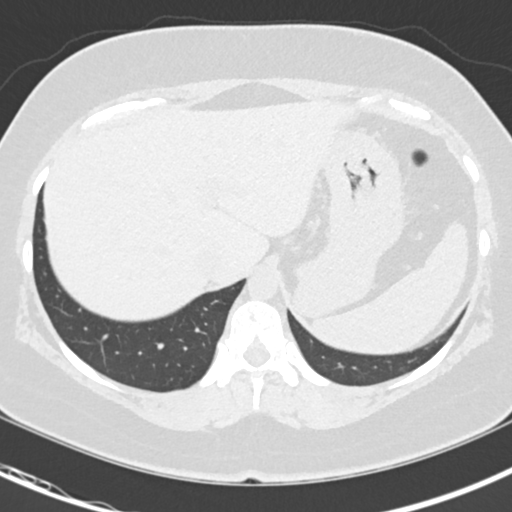
[im 125/143  soft-tissue]
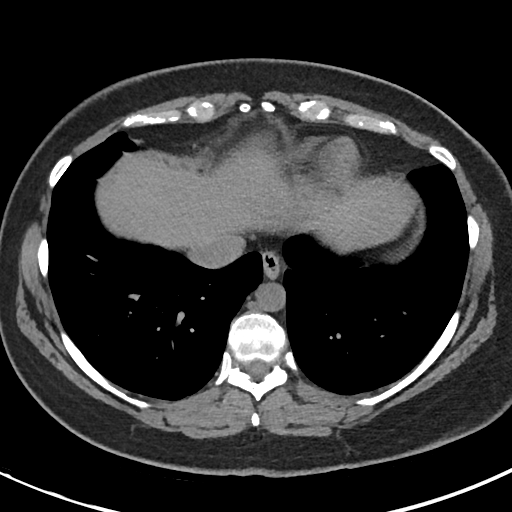
[im 125/143  lung]
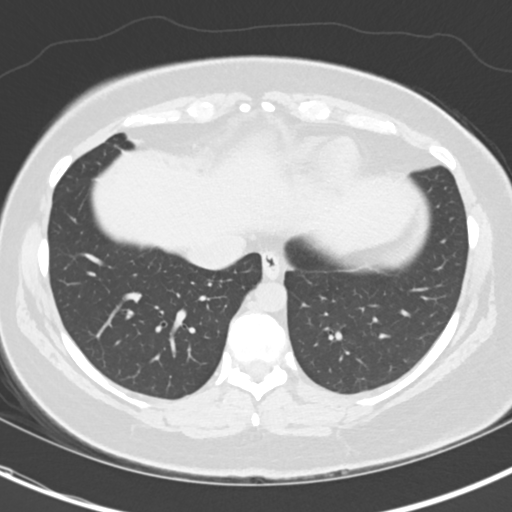

[Series 3: renal arterial · axial · arterial · 0.62mm/px · z∈[-868,-766]mm · 4 of 138 slices shown]
[im 18/138  soft-tissue]
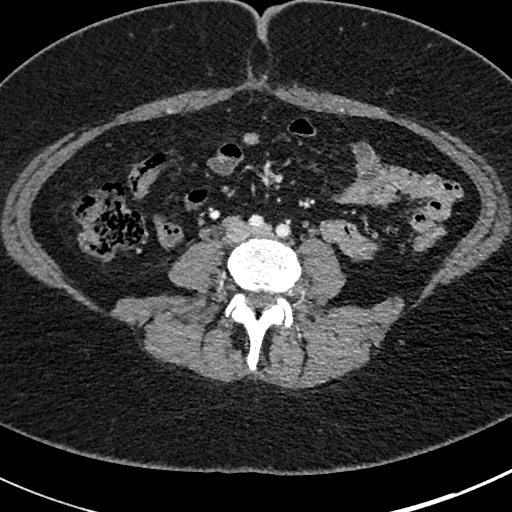
[im 35/138  soft-tissue]
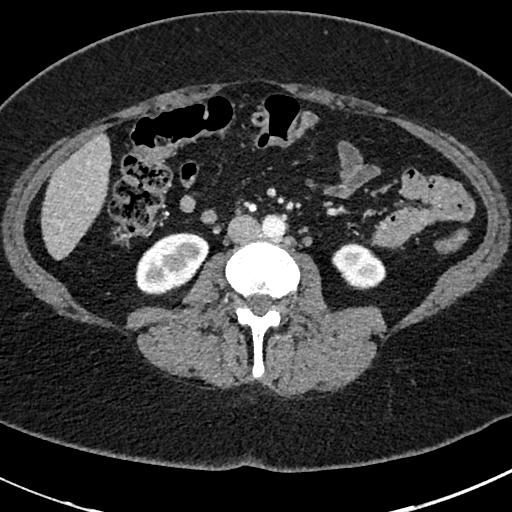
[im 52/138  soft-tissue]
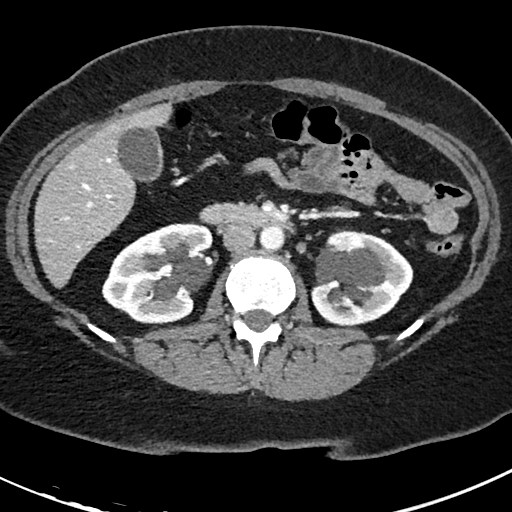
[im 69/138  soft-tissue]
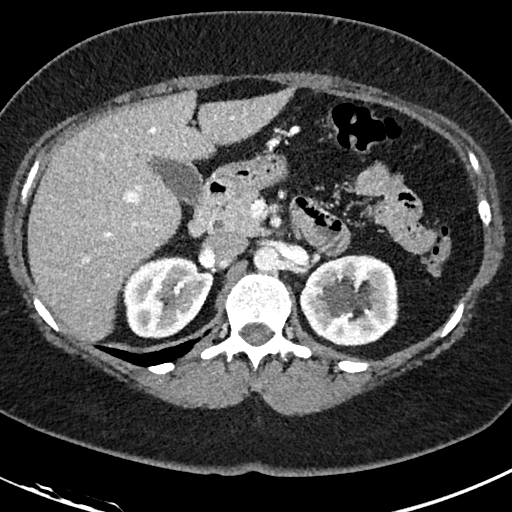

[Series 8: renal without cor · coronal · non-contrast · 0.56mm/px · 2 of 139 slices shown, 3 images]
[im 47/139  soft-tissue]
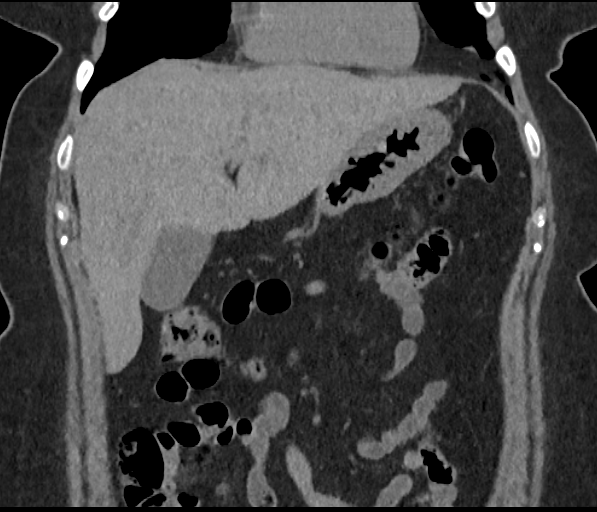
[im 47/139  bone]
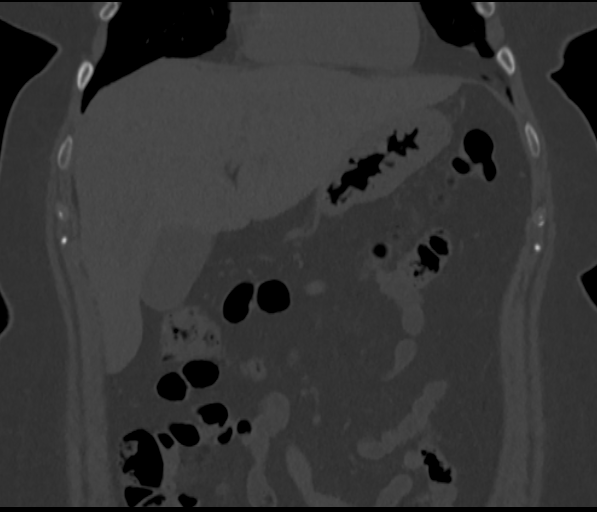
[im 93/139  soft-tissue]
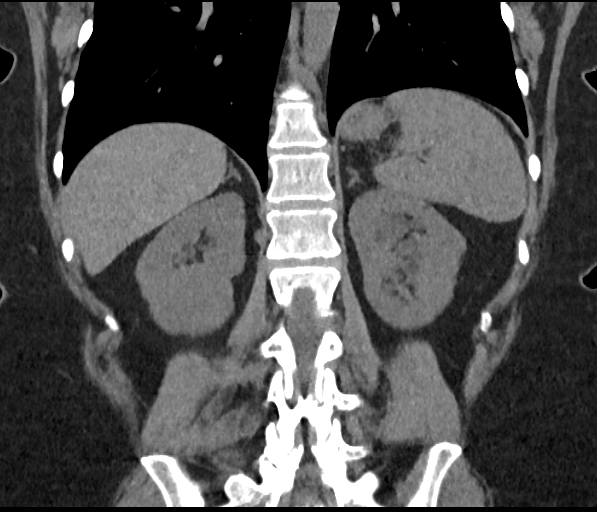

[13 of 46 positions shown; findings below may reference images not displayed]

FINDINGS: Lower chest:  Lung bases are clear.

Hepatobiliary: Liver is within normal limits. No
suspicious/enhancing hepatic lesions.

Gallbladder unremarkable. No intrahepatic or extrahepatic ductal
dilatation.

Pancreas: Within normal limits.

Spleen: Within normal limits.

Adrenals/Urinary Tract: Adrenal glands are within normal limits.

Kidneys are notable for bilateral renal sinus cysts, benign. No
enhancing renal lesions. No renal calculi or hydronephrosis.

Stomach/Bowel: Stomach is within normal limits.

Visualized bowel is unremarkable.

Vascular/Lymphatic: No evidence abdominal aortic aneurysm.

No suspicious abdominal lymphadenopathy.

Other: No abdominal ascites.

Musculoskeletal: Mild degenerative changes of the lower lumbar
spine.
IMPRESSION: Bilateral renal sinus cysts, benign (Bosniak I).

## 2017-05-04 ENCOUNTER — Ambulatory Visit: Payer: Managed Care, Other (non HMO) | Admitting: Psychiatry

## 2017-05-10 ENCOUNTER — Ambulatory Visit: Payer: Self-pay | Admitting: Psychiatry

## 2017-05-10 ENCOUNTER — Other Ambulatory Visit: Payer: Self-pay

## 2017-05-10 ENCOUNTER — Encounter: Payer: Self-pay | Admitting: Psychiatry

## 2017-05-10 VITALS — BP 133/82 | HR 76 | Temp 98.2°F | Wt 243.2 lb

## 2017-05-10 DIAGNOSIS — G2581 Restless legs syndrome: Secondary | ICD-10-CM

## 2017-05-10 DIAGNOSIS — R61 Generalized hyperhidrosis: Secondary | ICD-10-CM

## 2017-05-10 DIAGNOSIS — F411 Generalized anxiety disorder: Secondary | ICD-10-CM

## 2017-05-10 DIAGNOSIS — F331 Major depressive disorder, recurrent, moderate: Secondary | ICD-10-CM

## 2017-05-10 MED ORDER — TRAZODONE HCL 150 MG PO TABS: 150.0000 mg | ORAL_TABLET | Freq: Every evening | ORAL | 1 refills | Status: DC | PRN

## 2017-05-10 MED ORDER — VENLAFAXINE HCL ER 75 MG PO CP24: 225.0000 mg | ORAL_CAPSULE | Freq: Every day | ORAL | 1 refills | Status: DC

## 2017-05-10 MED ORDER — HYDROXYZINE HCL 25 MG PO TABS
25.0000 mg | ORAL_TABLET | Freq: Two times a day (BID) | ORAL | 1 refills | Status: DC | PRN
Start: 2017-05-10 — End: 2017-07-03

## 2017-05-10 NOTE — Progress Notes (Signed)
Psychiatric Progress note  Patient Identification: Kelly [name] MRN:  [id number] Date of Evaluation:  05/10/2017 Referral Source: Dr.Sowles Chief Complaint:  Tearful, depressed Chief Complaint    Follow-up; Depression; Anxiety; Panic Attack; Stress; Fatigue    depression, anxiety Visit Diagnosis:    ICD-10-CM   1. MDD (major depressive disorder), recurrent episode, moderate (HCC) F33.1   2. GAD (generalized anxiety disorder) F41.1     History of Present Illness:  Patient is a 51 year-old Caucasian female who Is seen today for a follow-up off for depression and anxiety. Reports not doing well. They have to move since their home is being sold by the owner. She has not been sleeping well. She is stressed and worried about her husband. She reports sending the 16yo back to her parents and the 59 yo has moved out. She denies any suicidal thoughts. States that once they move into the new place she will feel better. She is also reporting frequent panic attacks.  PHQ 9 of 23  Past Psychiatric History: none, denies any suicidal thoughts  Previous Psychotropic Medications: Yes   Substance Abuse History in the last 12 months:  No.  Consequences of Substance Abuse: Negative  Past Medical History:  Past Medical History:  Diagnosis Date  . Allergy   . Anxiety   . Depression   . GERD (gastroesophageal reflux disease)   . History of kidney stones   . Insomnia   . Intermittent low back pain   . Migraines   . Osteoarthritis   . Recurrent UTI   . Restless leg syndrome   . Sciatica of right side   . Symptomatic menopausal or female climacteric states   . Vertigo   . Vitamin D deficiency     Past Surgical History:  Procedure Laterality Date  . BACK SURGERY    . DIAGNOSTIC LAPAROSCOPY    . DILATION AND CURETTAGE OF UTERUS    . KNEE SURGERY Left 05/2011   Dr. Rudene Christians- Arthroscopic  . TONSILLECTOMY AND ADENOIDECTOMY    . URETHRAL STRICTURE DILATATION      Family Psychiatric  History: on father`s side aunts and uncles, cousins committed suicide (82 of them)  Family History:  Family History  Problem Relation Age of Onset  . Cancer Paternal Grandmother     Social History:   Social History   Socioeconomic History  . Marital status: Married    Spouse name: Jenny Reichmann  . Number of children: 2  . Years of education: None  . Highest education level: Associate degree: occupational, Hotel manager, or vocational program  Social Needs  . Financial resource strain: Very hard  . Food insecurity - worry: Often true  . Food insecurity - inability: Often true  . Transportation needs - medical: No  . Transportation needs - non-medical: Yes  Occupational History  . Occupation: not employed  Tobacco Use  . Smoking status: Former Smoker    Packs/day: 0.75    Years: 2.00    Pack years: 1.50    Types: Cigarettes    Start date: 07/06/1981    Last attempt to quit: 07/07/1983    Years since quitting: 33.8  . Smokeless tobacco: Never Used  Substance and Sexual Activity  . Alcohol use: No    Alcohol/week: 0.0 oz  . Drug use: No  . Sexual activity: Yes    Partners: Male  Other Topics Concern  . None  Social History Narrative  . None    Additional Social History: Lives with husband and 2  daughters and her mom.  Allergies:   Allergies  Allergen Reactions  . Penicillins Anaphylaxis, Hives, Nausea And Vomiting and Swelling    Has patient had a PCN reaction causing immediate rash, facial/tongue/throat swelling, SOB or lightheadedness with hypotension: Yes Has patient had a PCN reaction causing severe rash involving mucus membranes or skin necrosis: Yes Has patient had a PCN reaction that required hospitalization No Has patient had a PCN reaction occurring within the last 10 years: Yes If all of the above answers are "NO", then may proceed with Cephalosporin use.   . Chlorhexidine Gluconate Itching and Rash  . Ciprofloxacin Hives  . Clindamycin/Lincomycin Hives  .  Erythromycin Hives and Nausea And Vomiting  . Keflex [Cephalexin] Hives  . Nitrofurantoin Monohyd Macro Hives and Nausea And Vomiting  . Other Hives  . Sulfa Antibiotics Hives and Nausea And Vomiting    Rapid heart rate  . Tetracyclines & Related Hives  . Adhesive [Tape] Rash  . Latex Rash  . Vancomycin Itching and Rash    Metabolic Disorder Labs: Lab Results  Component Value Date   HGBA1C 5.6 12/31/2016   MPG 114 12/31/2016   MPG 111 02/27/2016   No results found for: PROLACTIN Lab Results  Component Value Date   CHOL 139 12/31/2016   TRIG 111 12/31/2016   HDL 53 12/31/2016   CHOLHDL 2.6 12/31/2016   VLDL 22 12/31/2016   LDLCALC 64 12/31/2016   LDLCALC 74 02/27/2016     Current Medications: Current Outpatient Medications  Medication Sig Dispense Refill  . azithromycin (ZITHROMAX Z-PAK) 250 MG tablet Take 2 tablets (500 mg) on  Day 1,  followed by 1 tablet (250 mg) once daily on Days 2 through 5. 6 each 0  . cloNIDine (CATAPRES) 0.1 MG tablet TAKE 1 TABLET (0.1 MG TOTAL) BY MOUTH AT BEDTIME AS NEEDED. 30 tablet 2  . HYDROcodone-acetaminophen (NORCO) 5-325 MG tablet Take 1-2 tablets by mouth every 6 (six) hours as needed for moderate pain. 30 tablet 0  . hydrOXYzine (ATARAX/VISTARIL) 10 MG tablet Take 1 tablet (10 mg total) by mouth 2 (two) times daily as needed. 30 tablet 0  . loratadine (CLARITIN) 10 MG tablet Take 1 tablet (10 mg total) by mouth daily. 90 tablet 5  . metaxalone (SKELAXIN) 800 MG tablet Take 800 mg by mouth 3 (three) times daily.    Marland Kitchen omeprazole (PRILOSEC) 40 MG capsule Take 1 capsule (40 mg total) by mouth every morning. 90 capsule 5  . oxyCODONE-acetaminophen (PERCOCET/ROXICET) 5-325 MG tablet Take 1 tablet by mouth every 4 (four) hours as needed for severe pain.    . pramipexole (MIRAPEX) 1.5 MG tablet TAKE 1 TABLET BY MOUTH EVERY EVENING FOR RLS 90 tablet 0  . predniSONE (DELTASONE) 10 MG tablet Take 6 tablets  today, on day 2 take 5 tablets, day 3  take 4 tablets, day 4 take 3 tablets, day 5 take  2 tablets and 1 tablet the last day 21 tablet 0  . pregabalin (LYRICA) 50 MG capsule Take 50 mg by mouth at bedtime.     . ranitidine (ZANTAC) 150 MG tablet Take 1 tablet (150 mg total) by mouth at bedtime. 90 tablet 5  . traZODone (DESYREL) 150 MG tablet Take 1 tablet (150 mg total) by mouth at bedtime as needed for sleep. 30 tablet 1  . triamcinolone (NASACORT ALLERGY 24HR) 55 MCG/ACT AERO nasal inhaler Place 2 sprays into the nose at bedtime. 1 Inhaler 5  . venlafaxine XR (EFFEXOR-XR) 150  MG 24 hr capsule Take 1 capsule (150 mg total) by mouth at bedtime. 90 capsule 0  . Vitamin D, Ergocalciferol, (DRISDOL) 50000 units CAPS capsule Take 50,000 Units by mouth every 7 (seven) days. WEDNESDAY     No current facility-administered medications for this visit.     Neurologic: Headache: Negative Seizure: No Paresthesias:No  Musculoskeletal: Strength & Muscle Tone: within normal limits Gait & Station: normal Patient leans: N/A  Psychiatric Specialty Exam: ROS  Blood pressure 133/82, pulse 76, temperature 98.2 F (36.8 C), temperature source Oral, weight 243 lb 3.2 oz (110.3 kg).Body mass index is 40.47 kg/m.  General Appearance: Casual  Eye Contact:  Fair  Speech:  Clear and Coherent  Volume:  Normal  Mood:  depressed  Affect:  tearful  Thought Process:  Coherent  Orientation:  Full (Time, Place, and Person)  Thought Content:  WDL  Suicidal Thoughts:  No  Homicidal Thoughts:  No  Memory:  Immediate;   Fair Recent;   Fair Remote;   Fair  Judgement:  Fair  Insight:  Fair  Psychomotor Activity:  Normal  Concentration:  Concentration: Fair and Attention Span: Fair  Recall:  [employer] of Knowledge:Fair  Language: Fair  Akathisia:  No  Handed:  Right  AIMS (if indicated):  na  Assets:  Communication Skills Desire for Improvement Financial Resources/Insurance Housing Resilience Social Support  ADL's:  Intact  Cognition:  WNL  Sleep:  Not sleeping well    Treatment Plan Summary:  Major depressive disorder recurrent  Increase effexor to 225mg  po qd. Continue to see therapist.  Insomnia Not taking the trazodone currently.   Anxiety Take hydroxyzine at 25mg  po bid as needed for anxiety.  Return to Clinic in 3-4 weeks  time or call before if necessary    Elvin So, MD 11/5/20184:55 PM

## 2017-05-12 ENCOUNTER — Encounter: Payer: Self-pay | Admitting: *Deleted

## 2017-05-12 ENCOUNTER — Emergency Department
Admission: EM | Admit: 2017-05-12 | Discharge: 2017-05-12 | Disposition: A | Discharge status: Home / Self Care | Payer: 59 | Attending: Emergency Medicine | Admitting: Emergency Medicine

## 2017-05-12 DIAGNOSIS — F410 Panic disorder [episodic paroxysmal anxiety] without agoraphobia: Secondary | ICD-10-CM | POA: Diagnosis not present

## 2017-05-12 DIAGNOSIS — Z760 Encounter for issue of repeat prescription: Secondary | ICD-10-CM | POA: Insufficient documentation

## 2017-05-12 DIAGNOSIS — Z5321 Procedure and treatment not carried out due to patient leaving prior to being seen by health care provider: Secondary | ICD-10-CM | POA: Insufficient documentation

## 2017-05-12 DIAGNOSIS — G2581 Restless legs syndrome: Secondary | ICD-10-CM | POA: Diagnosis not present

## 2017-05-12 NOTE — ED Triage Notes (Signed)
States she is out of her effexor, hydroxine and Mirapex, states she can not handle the panic attacks and restless legs and needs her medication

## 2017-05-13 ENCOUNTER — Telehealth: Payer: Self-pay | Admitting: Family Medicine

## 2017-05-13 ENCOUNTER — Other Ambulatory Visit: Payer: Self-pay | Admitting: Family Medicine

## 2017-05-13 DIAGNOSIS — G2581 Restless legs syndrome: Secondary | ICD-10-CM

## 2017-05-13 DIAGNOSIS — F331 Major depressive disorder, recurrent, moderate: Secondary | ICD-10-CM

## 2017-05-13 DIAGNOSIS — J302 Other seasonal allergic rhinitis: Secondary | ICD-10-CM

## 2017-05-13 DIAGNOSIS — R61 Generalized hyperhidrosis: Secondary | ICD-10-CM

## 2017-05-13 DIAGNOSIS — K219 Gastro-esophageal reflux disease without esophagitis: Secondary | ICD-10-CM

## 2017-05-13 MED ORDER — TRAZODONE HCL 150 MG PO TABS: 150.0000 mg | ORAL_TABLET | Freq: Every evening | ORAL | 0 refills | Status: DC | PRN

## 2017-05-13 MED ORDER — VENLAFAXINE HCL ER 75 MG PO CP24: 225.0000 mg | ORAL_CAPSULE | Freq: Every day | ORAL | 0 refills | Status: DC

## 2017-05-13 NOTE — Telephone Encounter (Signed)
Refill request for general medication: Mirapex 1.5 mg and Claritin 10 mg  Last office visit: 12/31/2016  Last physical exam: 12/31/2016  Follow up visit: None indicated

## 2017-05-13 NOTE — Telephone Encounter (Signed)
Copied from [facility] #[id number]. Topic: Quick Communication - See Telephone Encounter >> May 13, 2017  9:12 AM Robina Ade, Helene Kelp D wrote: CRM for notification. See Telephone encounter for: 05/13/17. Alura from [facility] called and is requesting refill for patient of her loratadine 10 mg & pramipexole 1.5 mg sent to the pharmacy, thanks.

## 2017-05-13 NOTE — Telephone Encounter (Signed)
LVM to inform pt to schedule an appt for refills

## 2017-05-13 NOTE — Telephone Encounter (Signed)
This has already been tee'd up and sent to the provider earlier this morning.

## 2017-05-17 ENCOUNTER — Telehealth: Payer: Self-pay

## 2017-05-17 NOTE — Telephone Encounter (Signed)
appt made for 05/25/17 with dr Ancil Boozer

## 2017-05-17 NOTE — Telephone Encounter (Signed)
Patient has not been here since 12/31/16 and has no future appointment. Patient will need an appointment for Dr. Ancil Boozer to send in patient medication. Please schedule. Thanks.

## 2017-05-25 ENCOUNTER — Ambulatory Visit: Payer: Self-pay | Admitting: Family Medicine

## 2017-05-27 ENCOUNTER — Other Ambulatory Visit: Payer: Self-pay | Admitting: Family Medicine

## 2017-05-27 DIAGNOSIS — F331 Major depressive disorder, recurrent, moderate: Secondary | ICD-10-CM

## 2017-06-01 ENCOUNTER — Ambulatory Visit (INDEPENDENT_AMBULATORY_CARE_PROVIDER_SITE_OTHER): Payer: 59 | Admitting: Psychiatry

## 2017-06-01 ENCOUNTER — Encounter: Payer: Self-pay | Admitting: Psychiatry

## 2017-06-01 ENCOUNTER — Other Ambulatory Visit: Payer: Self-pay

## 2017-06-01 VITALS — BP 113/75 | HR 79 | Temp 97.9°F | Wt 244.0 lb

## 2017-06-01 DIAGNOSIS — F411 Generalized anxiety disorder: Secondary | ICD-10-CM | POA: Diagnosis not present

## 2017-06-01 DIAGNOSIS — F331 Major depressive disorder, recurrent, moderate: Secondary | ICD-10-CM | POA: Diagnosis not present

## 2017-06-01 MED ORDER — TRAZODONE HCL 150 MG PO TABS: 150.0000 mg | ORAL_TABLET | Freq: Every evening | ORAL | 2 refills | Status: DC | PRN

## 2017-06-01 MED ORDER — TRAZODONE HCL 100 MG PO TABS: 200.0000 mg | ORAL_TABLET | Freq: Every evening | ORAL | 2 refills | Status: DC | PRN

## 2017-06-01 MED ORDER — VENLAFAXINE HCL ER 150 MG PO CP24: 150.0000 mg | ORAL_CAPSULE | Freq: Every day | ORAL | 2 refills | Status: DC

## 2017-06-01 NOTE — Progress Notes (Signed)
Psychiatric Progress note  Patient Identification: Kelly [name] MRN:  [id number] Date of Evaluation:  06/01/2017 Referral Source: Dr.Sowles Chief Complaint:  Doing better Chief Complaint    Follow-up; Medication Refill     Visit Diagnosis:    ICD-10-CM   1. MDD (major depressive disorder), recurrent episode, moderate (HCC) F33.1   2. GAD (generalized anxiety disorder) F41.1     History of Present Illness:  Patient is a 51 year-old Caucasian female who Is seen today for a follow-up off for depression and anxiety. Reports she is doing better. 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 She reports they have also moved into their new home and believes it has helped. She reports taking the Effexor at 150mg , says she was just taking the 150mg , her prescription instructions on the bottle read "take 2 to 3 tablets of the 75mg  of Effexor". Reports some trouble sleeping even with the trazodone. States she does have some stress with all the changes in her life. Denies any suicidal thoughts.   Past Psychiatric History: none, denies any suicidal thoughts  Previous Psychotropic Medications: Yes   Substance Abuse History in the last 12 months:  No.  Consequences of Substance Abuse: Negative  Past Medical History:  Past Medical History:  Diagnosis Date  . Allergy   . Anxiety   . Depression   . GERD (gastroesophageal reflux disease)   . History of kidney stones   . Insomnia   . Intermittent low back pain   . Migraines   . Osteoarthritis   . Recurrent UTI   . Restless leg syndrome   . Sciatica of right side   . Symptomatic menopausal or female climacteric states   . Vertigo   . Vitamin D deficiency     Past Surgical History:  Procedure Laterality Date  . BACK SURGERY    . DIAGNOSTIC LAPAROSCOPY    . DILATION AND CURETTAGE OF UTERUS    . KNEE ARTHROSCOPY WITH MEDIAL MENISECTOMY Left 01/14/2017   Procedure: KNEE ARTHROSCOPY WITH MEDIAL AND LATERAL MENISECTOMY;  Surgeon: Hessie Knows, MD;  Location: [facility] ORS;  Service: Orthopedics;  Laterality: Left;  Partial Knee menisectomy  . KNEE SURGERY Left 05/2011   Dr. Rudene Christians- Arthroscopic  . LUMBAR LAMINECTOMY/DECOMPRESSION MICRODISCECTOMY N/A 09/16/2016   Procedure: LUMBAR LAMINECTOMY/DECOMPRESSION MICRODISCECTOMY 1 LEVEL L5-S1;  Surgeon: Meade Maw, MD;  Location: [facility] OR[facility];  Service: Neurosurgery;  Laterality: N/A;  . TONSILLECTOMY AND ADENOIDECTOMY    . URETHRAL STRICTURE DILATATION      Family Psychiatric History: on father`s side aunts and uncles, cousins committed suicide (15 of them)  Family History:  Family History  Problem Relation Age of Onset  . Cancer Paternal Grandmother     Social History:   Social History   Socioeconomic History  . Marital status: Married    Spouse name: Jenny Reichmann  . Number of children: 2  . Years of education: None  . Highest education level: Associate degree: occupational, Hotel manager, or vocational program  Social Needs  . Financial resource strain: Very hard  . Food insecurity - worry: Often true  . Food insecurity - inability: Often true  . Transportation needs - medical: No  . Transportation needs - non-medical: Yes  Occupational History  . Occupation: not employed  Tobacco Use  . Smoking status: Former Smoker    Packs/day: 0.75    Years: 2.00    Pack years: 1.50    Types: Cigarettes    Start date: 07/06/1981    Last  attempt to quit: 07/07/1983    Years since quitting: 33.9  . Smokeless tobacco: Never Used  Substance and Sexual Activity  . Alcohol use: No    Alcohol/week: 0.0 oz  . Drug use: No  . Sexual activity: Yes    Partners: Male  Other Topics Concern  . None  Social History Narrative  . None    Additional Social History: Lives with husband and 2 daughters and her mom.  Allergies:   Allergies  Allergen Reactions  . Penicillins Anaphylaxis, Hives, Nausea And Vomiting and Swelling    Has patient had a PCN reaction causing immediate rash,  facial/tongue/throat swelling, SOB or lightheadedness with hypotension: Yes Has patient had a PCN reaction causing severe rash involving mucus membranes or skin necrosis: Yes Has patient had a PCN reaction that required hospitalization No Has patient had a PCN reaction occurring within the last 10 years: Yes If all of the above answers are "NO", then may proceed with Cephalosporin use.   . Chlorhexidine Gluconate Itching and Rash  . Ciprofloxacin Hives  . Clindamycin/Lincomycin Hives  . Erythromycin Hives and Nausea And Vomiting  . Keflex [Cephalexin] Hives  . Nitrofurantoin Monohyd Macro Hives and Nausea And Vomiting  . Other Hives  . Sulfa Antibiotics Hives and Nausea And Vomiting    Rapid heart rate  . Tetracyclines & Related Hives  . Adhesive [Tape] Rash  . Latex Rash  . Vancomycin Itching and Rash    Metabolic Disorder Labs: Lab Results  Component Value Date   HGBA1C 5.6 12/31/2016   MPG 114 12/31/2016   MPG 111 02/27/2016   No results found for: PROLACTIN Lab Results  Component Value Date   CHOL 139 12/31/2016   TRIG 111 12/31/2016   HDL 53 12/31/2016   CHOLHDL 2.6 12/31/2016   VLDL 22 12/31/2016   LDLCALC 64 12/31/2016   LDLCALC 74 02/27/2016     Current Medications: Current Outpatient Medications  Medication Sig Dispense Refill  . cloNIDine (CATAPRES) 0.1 MG tablet TAKE 1 TABLET (0.1 MG TOTAL) BY MOUTH AT BEDTIME AS NEEDED. 30 tablet 2  . hydrOXYzine (ATARAX/VISTARIL) 25 MG tablet Take 1 tablet (25 mg total) 2 (two) times daily as needed by mouth. 60 tablet 1  . loratadine (CLARITIN) 10 MG tablet Take 1 tablet (10 mg total) by mouth daily. 90 tablet 5  . omeprazole (PRILOSEC) 40 MG capsule Take 1 capsule (40 mg total) by mouth every morning. 90 capsule 5  . pramipexole (MIRAPEX) 1.5 MG tablet TAKE 1 TABLET BY MOUTH EVERY EVENING FOR RLS 90 tablet 0  . predniSONE (DELTASONE) 10 MG tablet Take 6 tablets  today, on day 2 take 5 tablets, day 3 take 4 tablets, day  4 take 3 tablets, day 5 take  2 tablets and 1 tablet the last day 21 tablet 0  . ranitidine (ZANTAC) 150 MG tablet Take 1 tablet (150 mg total) by mouth at bedtime. 90 tablet 5  . traZODone (DESYREL) 150 MG tablet Take 1 tablet (150 mg total) at bedtime as needed by mouth for sleep. 30 tablet 0  . triamcinolone (NASACORT ALLERGY 24HR) 55 MCG/ACT AERO nasal inhaler Place 2 sprays into the nose at bedtime. 1 Inhaler 5  . venlafaxine XR (EFFEXOR-XR) 75 MG 24 hr capsule Take 3 capsules (225 mg total) at bedtime by mouth. 30 capsule 0  . Vitamin D, Ergocalciferol, (DRISDOL) 50000 units CAPS capsule Take 50,000 Units by mouth every 7 (seven) days. WEDNESDAY     No current  facility-administered medications for this visit.     Neurologic: Headache: Negative Seizure: No Paresthesias:No  Musculoskeletal: Strength & Muscle Tone: within normal limits Gait & Station: normal Patient leans: N/A  Psychiatric Specialty Exam: ROS  Blood pressure 113/75, pulse 79, temperature 97.9 F (36.6 C), temperature source Oral, weight 110.7 kg (244 lb).Body mass index is 40.6 kg/m.  General Appearance: Casual  Eye Contact:  Fair  Speech:  Clear and Coherent  Volume:  Normal  Mood:  Significantly improved  Affect:  Smiling, pleasant  Thought Process:  Coherent  Orientation:  Full (Time, Place, and Person)  Thought Content:  WDL  Suicidal Thoughts:  No  Homicidal Thoughts:  No  Memory:  Immediate;   Fair Recent;   Fair Remote;   Fair  Judgement:  Fair  Insight:  Fair  Psychomotor Activity:  Normal  Concentration:  Concentration: Fair and Attention Span: Fair  Recall:  [place]: Fair  Akathisia:  No  Handed:  Right  AIMS (if indicated):  na  Assets:  Communication Skills Desire for Improvement Financial Resources/Insurance Housing Resilience Social Support  ADL's:  Intact  Cognition: WNL  Sleep:  Up and down    Treatment Plan Summary:  Major depressive  disorder recurrent  Continue Effexor at 150mg  po qd. Continue to see therapist.  Insomnia Increase Trazodone to 200mg  po qhs. Discussed sleep hygiene techniques.   Anxiety Take hydroxyzine at 25mg  po bid as needed for anxiety.  Return to Clinic in 3 months  time or call before if necessary. Patient will transition to Dr.Eappen at next visit and sh eis okay with this.    Elvin So, MD 11/27/20182:15 PM

## 2017-06-16 ENCOUNTER — Ambulatory Visit: Admission: RE | Admit: 2017-06-16 | Payer: 59 | Source: Ambulatory Visit | Admitting: Internal Medicine

## 2017-06-16 ENCOUNTER — Encounter: Admission: RE | Payer: Self-pay | Source: Ambulatory Visit

## 2017-06-16 SURGERY — COLONOSCOPY WITH PROPOFOL
Anesthesia: General

## 2017-06-17 ENCOUNTER — Other Ambulatory Visit: Payer: Self-pay

## 2017-06-17 ENCOUNTER — Ambulatory Visit
Payer: 59 | Attending: Student in an Organized Health Care Education/Training Program | Admitting: Student in an Organized Health Care Education/Training Program

## 2017-06-17 ENCOUNTER — Encounter: Payer: Self-pay | Admitting: Student in an Organized Health Care Education/Training Program

## 2017-06-17 VITALS — BP 124/60 | HR 83 | Temp 98.3°F | Resp 16 | Ht 65.0 in | Wt 240.0 lb

## 2017-06-17 DIAGNOSIS — E559 Vitamin D deficiency, unspecified: Secondary | ICD-10-CM | POA: Insufficient documentation

## 2017-06-17 DIAGNOSIS — M5136 Other intervertebral disc degeneration, lumbar region: Secondary | ICD-10-CM | POA: Diagnosis not present

## 2017-06-17 DIAGNOSIS — Z79899 Other long term (current) drug therapy: Secondary | ICD-10-CM | POA: Insufficient documentation

## 2017-06-17 DIAGNOSIS — Z87891 Personal history of nicotine dependence: Secondary | ICD-10-CM | POA: Diagnosis not present

## 2017-06-17 DIAGNOSIS — G8929 Other chronic pain: Secondary | ICD-10-CM | POA: Insufficient documentation

## 2017-06-17 DIAGNOSIS — F339 Major depressive disorder, recurrent, unspecified: Secondary | ICD-10-CM | POA: Diagnosis not present

## 2017-06-17 DIAGNOSIS — K219 Gastro-esophageal reflux disease without esophagitis: Secondary | ICD-10-CM | POA: Diagnosis not present

## 2017-06-17 DIAGNOSIS — M51369 Other intervertebral disc degeneration, lumbar region without mention of lumbar back pain or lower extremity pain: Secondary | ICD-10-CM

## 2017-06-17 DIAGNOSIS — F419 Anxiety disorder, unspecified: Secondary | ICD-10-CM | POA: Diagnosis not present

## 2017-06-17 DIAGNOSIS — M5116 Intervertebral disc disorders with radiculopathy, lumbar region: Secondary | ICD-10-CM | POA: Diagnosis not present

## 2017-06-17 DIAGNOSIS — M47816 Spondylosis without myelopathy or radiculopathy, lumbar region: Secondary | ICD-10-CM

## 2017-06-17 DIAGNOSIS — M47896 Other spondylosis, lumbar region: Secondary | ICD-10-CM | POA: Insufficient documentation

## 2017-06-17 DIAGNOSIS — M4317 Spondylolisthesis, lumbosacral region: Secondary | ICD-10-CM | POA: Diagnosis not present

## 2017-06-17 DIAGNOSIS — R739 Hyperglycemia, unspecified: Secondary | ICD-10-CM | POA: Diagnosis not present

## 2017-06-17 DIAGNOSIS — M5416 Radiculopathy, lumbar region: Secondary | ICD-10-CM

## 2017-06-17 MED ORDER — TIZANIDINE HCL 4 MG PO TABS: 4.0000 mg | ORAL_TABLET | Freq: Two times a day (BID) | ORAL | 1 refills | Status: DC | PRN

## 2017-06-17 MED ORDER — DICLOFENAC SODIUM 75 MG PO TBEC: 75.0000 mg | DELAYED_RELEASE_TABLET | Freq: Two times a day (BID) | ORAL | 0 refills | Status: DC

## 2017-06-17 NOTE — Progress Notes (Signed)
Patient's Name: Kelly [name]  MRN: [id number]  Referring Provider: Steele Sizer, MD  DOB: 21-Feb-1966  PCP: Steele Sizer, MD  DOS: 06/17/2017  Note by: Gillis Santa, MD  Service setting: Ambulatory outpatient  Specialty: Interventional Pain Management  Location: [facility] (AMB) Pain Management Facility  Visit type: Initial Patient Evaluation  Patient type: New Patient   Primary Reason(s) for Visit: Encounter for initial evaluation of one or more chronic problems (new to examiner) potentially causing chronic pain, and posing a threat to normal musculoskeletal function. (Level of risk: High) CC: Back Pain (low)  HPI  Kelly [name] is a 51 y.o. year old, female patient, who comes today to see [place] for the first time for an initial evaluation of her chronic pain. She has Moderate recurrent major depression ([place]); Gastro-esophageal reflux disease without esophagitis; Bulge of lumbar disc without myelopathy; Dysmetabolic syndrome; Migraine without aura and responsive to treatment; Osteoarthrosis; Obesity (BMI 30-39.9); Restless leg; Allergic rhinitis, seasonal; Right lumbar radiculitis; Hyperglycemia; Hematuria; and Spondylolisthesis of lumbosacral region on their problem list. Today she comes in for evaluation of her Back Pain (low)  Pain Assessment: Location: Lower Back Radiating: radiates down both legs on the outside to knee Onset: More than a month ago Duration: Chronic pain Quality: Pressure, Throbbing, Aching Severity: 10-Worst pain ever/10 (self-reported pain score)  Note: Reported level is inconsistent with clinical observations. Clinically the patient looks like a 2/10 A 2/10 is viewed as "Mild to Moderate" and described as noticeable and distracting. Impossible to hide from other people. More frequent flare-ups. Still possible to adapt and function close to normal. It can be very annoying and may have occasional stronger flare-ups. With discipline, patients may get used to it and adapt.        When using our objective Pain Scale, levels between 6 and 10/10 are said to belong in an emergency room, as it progressively worsens from a 6/10, described as severely limiting, requiring emergency care not usually available at an outpatient pain management facility. At a 6/10 level, communication becomes difficult and requires great effort. Assistance to reach the emergency department may be required. Facial flushing and profuse sweating along with potentially dangerous increases in heart rate and blood pressure will be evident. Effect on ADL:   Timing: Constant Modifying factors: nothing  Onset and Duration: Gradual Cause of pain: falls, breathing, childbirth Severity: Getting worse, NAS-11 at its worse: 10/10, NAS-11 at its best: 8/10, NAS-11 now: 10/10 and NAS-11 on the average: 9/10 Timing: Not influenced by the time of the day, During activity or exercise, After activity or exercise and After a period of immobility Aggravating Factors: Bending, Climbing, Kneeling, Lifiting, Motion, Prolonged sitting, Prolonged standing, Squatting, Stooping , Surgery made it worse, Twisting, Walking, Walking uphill and Walking downhill Alleviating Factors: TENS and Warm showers or baths Associated Problems: Day-time cramps, Night-time cramps, Depression, Dizziness, Inability to control bladder (urine), Numbness, Spasms, Sweating, Swelling, Tingling, Weakness, Pain that wakes patient up and Pain that does not allow patient to sleep Quality of Pain: Aching, Agonizing, Burning, Cramping, Deep, Disabling, Exhausting, Getting longer, Getting shorter, Heavy, Lancinating, Sharp, Sickening, Stabbing, Tingling and Uncomfortable Previous Examinations or Tests: CT scan, MRI scan, X-rays, Nerve conduction test, Neurological evaluation, Orthopedic evaluation and Psychiatric evaluation Previous Treatments: Epidural steroid injections, Narcotic medications, Physical Therapy, Relaxation therapy, Steroid treatments by mouth,  Strengthening exercises, Stretching exercises and TENS  The patient comes into the clinics today for the first time for a chronic pain management evaluation.   Very pleasant  51 year old female who presents as a referral from Dr. Cari Caraway with neurosurgery.  Her primary complaint is her axial low back and right leg pain.  She states that her pain is approximately 95% back and 5% leg and of the leg involvement right is greater than left.  Patient also has major depressive disorder for which she sees psychiatry.  Of note patient had a fall in May 2018 which resulted in injury to her meniscus.  In June she went off to have left knee arthroscopy.  She continues to have a left knee brace in place.  She states that her knee pain is different from her chronic low back pain that radiates to the back of her thighs but rarely beyond that.  Patient status post L5-S1 microdiscectomy and decompression in March 2018.  This was done after she failed conservative treatment with a transforaminal epidural steroid injections and physical therapy.  Prior to her surgery, the patient was very active and would walk 1-2 miles a day for her occupation.  She comes in today as a referral for spinal cord stimulator evaluation.  Patient does not know much information about this procedure, risks and benefits.  Of note patient has also tried various medications including gabapentin, Lyrica, various muscle relaxants which have not been effective.  She is currently not on any opioid therapy.  Today I took the time to provide the patient with information regarding my pain practice. The patient was informed that my practice is divided into two sections: an interventional pain management section, as well as a completely separate and distinct medication management section. I explained that I have procedure days for my interventional therapies, and evaluation days for follow-ups and medication management. Because of the amount of documentation  required during both, they are kept separated. This means that there is the possibility that she may be scheduled for a procedure on one day, and medication management the next. I have also informed her that because of staffing and facility limitations, I no longer take patients for medication management only. To illustrate the reasons for this, I gave the patient the example of surgeons, and how inappropriate it would be to refer a patient to his/her care, just to write for the post-surgical antibiotics on a surgery done by a different surgeon.   Because interventional pain management is my board-certified specialty, the patient was informed that joining my practice means that they are open to any and all interventional therapies. I made it clear that this does not mean that they will be forced to have any procedures done. What this means is that I believe interventional therapies to be essential part of the diagnosis and proper management of chronic pain conditions. Therefore, patients not interested in these interventional alternatives will be better served under the care of a different practitioner.  The patient was also made aware of my Comprehensive Pain Management Safety Guidelines where by joining my practice, they limit all of their nerve blocks and joint injections to those done by our practice, for as long as we are retained to manage their care.   Historic Controlled Substance Pharmacotherapy Review  PMP and historical list of controlled substances: Percocet 7.5 mg, quantity 20, last filled 12/14/2016 for acute pain. not on chronic opioid therapy Medications: The patient did not bring the medication(s) to the appointment, as requested in our "New Patient Package" Pharmacodynamics: Desired effects: Analgesia: The patient reports >50% benefit. Reported improvement in function: The patient reports medication allows her to accomplish basic  ADLs. Clinically meaningful improvement in function  (CMIF): Sustained CMIF goals met Perceived effectiveness: Described as relatively effective, allowing for increase in activities of daily living (ADL) Undesirable effects: Side-effects or Adverse reactions: None reported Historical Monitoring: The patient  reports that she does not use drugs. List of all UDS Test(s): No results found for: MDMA, COCAINSCRNUR, [place], [place], CANNABQUANT, [place], [place] List of other Serum/Urine Drug Screening Test(s):  No results found for: AMPHSCRSER, BARBSCRSER, BENZOSCRSER, COCAINSCRSER, COCAINSCRNUR, PCPSCRSER, PCPQUANT, THCSCRSER, THCU, CANNABQUANT, OPIATESCRSER, OXYSCRSER, PROPOXSCRSER, ETH Historical Background Evaluation: [place] PMP: Six (6) year initial data search conducted.             [place] Department of public safety, offender search: Editor, commissioning Information) Non-contributory Risk Assessment Profile: Aberrant behavior: None observed or detected today Risk factors for fatal opioid overdose: None identified today Fatal overdose hazard ratio (HR): Calculation deferred Non-fatal overdose hazard ratio (HR): Calculation deferred Risk of opioid abuse or dependence: 0.7-3.0% with doses ? 36 MME/day and 6.1-26% with doses ? 120 MME/day. Substance use disorder (SUD) risk level: Low Opioid risk tool (ORT) (Total Score): 1 Opioid Risk Tool - 06/17/17 1218      Family History of Substance Abuse   Alcohol  Negative    Illegal Drugs  Negative    Rx Drugs  Negative      Personal History of Substance Abuse   Alcohol  Negative    Illegal Drugs  Negative    Rx Drugs  Negative      Age   Age between 36-45 years   No      History of Preadolescent Sexual Abuse   History of Preadolescent Sexual Abuse  Negative or Female      Psychological Disease   Psychological Disease  Negative    Depression  Positive      Total Score   Opioid Risk Tool Scoring  1    Opioid Risk Interpretation  Low Risk      ORT Scoring interpretation table:  Score <3 = Low Risk for SUD   Score between 4-7 = Moderate Risk for SUD  Score >8 = High Risk for Opioid Abuse   PHQ-2 Depression Scale:  Total score: 0  PHQ-2 Scoring interpretation table: (Score and probability of major depressive disorder)  Score 0 = No depression  Score 1 = 15.4% Probability  Score 2 = 21.1% Probability  Score 3 = 38.4% Probability  Score 4 = 45.5% Probability  Score 5 = 56.4% Probability  Score 6 = 78.6% Probability   PHQ-9 Depression Scale:  Total score: 0  PHQ-9 Scoring interpretation table:  Score 0-4 = No depression  Score 5-9 = Mild depression  Score 10-14 = Moderate depression  Score 15-19 = Moderately severe depression  Score 20-27 = Severe depression (2.4 times higher risk of SUD and 2.89 times higher risk of overuse)   Pharmacologic Plan: Pending ordered tests and/or consults            Initial impression: Pending review of available data and ordered tests.  Meds   Current Outpatient Medications:  .  cloNIDine (CATAPRES) 0.1 MG tablet, TAKE 1 TABLET (0.1 MG TOTAL) BY MOUTH AT BEDTIME AS NEEDED., Disp: 30 tablet, Rfl: 2 .  EPINEPHrine 0.3 mg/0.3 mL IJ SOAJ injection, Inject into the muscle., Disp: , Rfl:  .  hydrOXYzine (ATARAX/VISTARIL) 25 MG tablet, Take 1 tablet (25 mg total) 2 (two) times daily as needed by mouth., Disp: 60 tablet, Rfl: 1 .  loratadine (CLARITIN) 10 MG  tablet, Take 1 tablet (10 mg total) by mouth daily., Disp: 90 tablet, Rfl: 5 .  omeprazole (PRILOSEC) 40 MG capsule, Take 1 capsule (40 mg total) by mouth every morning., Disp: 90 capsule, Rfl: 5 .  pramipexole (MIRAPEX) 1.5 MG tablet, TAKE 1 TABLET BY MOUTH EVERY EVENING FOR RLS, Disp: 90 tablet, Rfl: 0 .  ranitidine (ZANTAC) 150 MG tablet, Take 1 tablet (150 mg total) by mouth at bedtime., Disp: 90 tablet, Rfl: 5 .  traZODone (DESYREL) 100 MG tablet, Take 2 tablets (200 mg total) by mouth at bedtime as needed for sleep., Disp: 60 tablet, Rfl: 2 .  triamcinolone (NASACORT ALLERGY 24HR) 55 MCG/ACT AERO nasal  inhaler, Place 2 sprays into the nose at bedtime., Disp: 1 Inhaler, Rfl: 5 .  venlafaxine XR (EFFEXOR-XR) 150 MG 24 hr capsule, Take 1 capsule (150 mg total) by mouth daily., Disp: 30 capsule, Rfl: 2 .  diclofenac (VOLTAREN) 75 MG EC tablet, Take 1 tablet (75 mg total) by mouth 2 (two) times daily., Disp: 60 tablet, Rfl: 0 .  predniSONE (DELTASONE) 10 MG tablet, Take 6 tablets  today, on day 2 take 5 tablets, day 3 take 4 tablets, day 4 take 3 tablets, day 5 take  2 tablets and 1 tablet the last day (Patient not taking: Reported on 06/17/2017), Disp: 21 tablet, Rfl: 0 .  tiZANidine (ZANAFLEX) 4 MG tablet, Take 1 tablet (4 mg total) by mouth 2 (two) times daily as needed for muscle spasms., Disp: 60 tablet, Rfl: 1 .  Vitamin D, Ergocalciferol, (DRISDOL) 50000 units CAPS capsule, Take 50,000 Units by mouth every 7 (seven) days. WEDNESDAY, Disp: , Rfl:      Lumbosacral Imaging: Lumbar MR wo contrast:  Results for orders placed during the hospital encounter of 05/22/15  MR Lumbar Spine Wo Contrast   Narrative CLINICAL DATA:  Low back pain with RIGHT leg pain. RIGHT leg weakness.  EXAM: MRI LUMBAR SPINE WITHOUT CONTRAST  TECHNIQUE: Multiplanar, multisequence MR imaging of the lumbar spine was performed. No intravenous contrast was administered.  COMPARISON:  CT abdomen pelvis 04/24/2015.  FINDINGS: Segmentation: Normal.  Alignment:  Normal except for trace retrolisthesis L2-3.  Vertebrae: No worrisome osseous lesion.  Conus medullaris: Normal in size, signal, and location.  Paraspinal tissues: No evidence for hydronephrosis or paravertebral mass. BILATERAL parapelvic cysts simulate hydronephrosis.  Disc levels:  L1-L2:  Normal.  L2-L3: Mild bulge. Mild facet arthropathy. Trace retrolisthesis. No impingement.  L3-L4:  Mild bulge.  Mild facet arthropathy.  No impingement.  L4-L5: Mild bulge. Small foraminal protrusion on the LEFT. Mild facet arthropathy. No  impingement.  L5-S1: Shallow central protrusion. Mild LEFT and moderate RIGHT facet arthropathy. Mild foraminal narrowing on the RIGHT is asymmetric but clear-cut L5 nerve root impingement not established. Asymmetric RIGHT facet joint effusion with extra-spinal dorsal synovial cyst, noncompressive. Does the patient have signs and symptoms of facet mediated lumbago?  IMPRESSION: Mild multilevel spondylosis. Significant impingement resulting in RIGHT leg weakness is not observed.  Asymmetric foraminal narrowing at L5-S1 on the RIGHT, not clearly affecting the RIGHT L5 nerve root. Asymmetric facet arthropathy with asymmetric joint effusion, at this level also on the RIGHT.  If the patient has signs symptoms of facet mediated lumbago, RIGHT L5-S1 facet injection could be helpful as a diagnostic and potentially therapeutic maneuver.   Electronically Signed   By: Staci Righter M.D.   On: 05/22/2015 10:06    Results for orders placed during the hospital encounter of 04/13/17  MR Lumbar  Spine W Wo Contrast   Narrative CLINICAL DATA:  Lumbar radiculopathy. Low back pain and right greater than left leg pain since a fall in 12/2016. Lumbar surgery in 09/2016.  EXAM: MRI LUMBAR SPINE WITHOUT AND WITH CONTRAST  TECHNIQUE: Multiplanar and multiecho pulse sequences of the lumbar spine were obtained without and with intravenous contrast.  CONTRAST:  90m MULTIHANCE GADOBENATE DIMEGLUMINE 529 MG/ML IV SOLN  COMPARISON:  05/22/2015  FINDINGS: Segmentation:  Standard.  Alignment: Unchanged 3 mm retrolisthesis of L2 on L3 and trace retrolisthesis of L3 on L4.  Vertebrae: No compression fracture or suspicious osseous lesion. Edema in the L5 and S1 pedicles bilaterally is similar to the prior study on the right and new on the left. No discrete fracture is identified, and this may reflect a stress reaction or be related to facet arthritis.  Conus medullaris: Extends to the L1-2 level  and appears normal.  Paraspinal and other soft tissues: Renal sinus cysts are again noted bilaterally.  Disc levels:  Disc desiccation throughout the lumbar spine. Mild disc space narrowing at L2-3 and L4-5.  T12-L1:  Mild facet arthrosis without disc herniation or stenosis.  L1-2:  Negative.  L2-3: Mild disc bulging greater to the left and mild facet arthrosis without stenosis, unchanged.  L3-4: Minimal disc bulging greater to the left and mild facet arthrosis without stenosis, unchanged.  L4-5: Mild disc bulging, small left foraminal disc protrusion, and mild facet arthrosis result in borderline to mild left neural foraminal stenosis without L4 nerve root compression, unchanged. No spinal stenosis.  L5-S1: Interval right laminectomy with expected enhancing postoperative granulation tissue in the epidural space on the right. Disc bulging and moderate to advanced facet hypertrophy result in mild persistent right neural foraminal stenosis without evidence of L5 nerve root compression. No significant left neural foraminal stenosis. No significant lateral recess or spinal stenosis.  IMPRESSION: 1. Interval postoperative changes at L5-S1. Mild right neural foraminal stenosis due to disc bulging and advanced facet arthrosis. 2. Unchanged disc and facet degeneration elsewhere without definite evidence of neural impingement.   Electronically Signed   By: ALogan BoresM.D.   On: 04/14/2017 07:48    Lumbar DG 2-3 views:  Results for orders placed during the hospital encounter of 09/16/16  DG Lumbar Spine 2-3 Views   Narrative CLINICAL DATA:  Lumbar disc herniation.  EXAM: DG C-ARM 61-120 MIN; LUMBAR SPINE - 1 VIEW  COMPARISON:  None.  FINDINGS: Single cross-table lateral view shows a probe overlying the spinal canal at the level of L5-S1.  IMPRESSION: Intraoperative localization of L5-S1.   Electronically Signed   By: JEarle GellM.D.   On: 09/16/2016 09:09      Knee Imaging: Knee-R MR w contrast: No results found for this or any previous visit. Knee-L MR w contrast:  Results for orders placed during the hospital encounter of 12/28/16  MR KNEE LEFT WO CONTRAST   Narrative CLINICAL DATA:  Patient states that she fell down the stairs 2 weeks ago, medial knee pain, history of lateral release on left knee  EXAM: MRI OF THE LEFT KNEE WITHOUT CONTRAST  TECHNIQUE: Multiplanar, multisequence MR imaging of the knee was performed. No intravenous contrast was administered.  COMPARISON:  None.  FINDINGS: MENISCI  Medial meniscus: Oblique tear of the posterior horn of the medial meniscus extending to the inferior articular surface.  Lateral meniscus: Small horizontal tear of the body of the lateral meniscus extending to the superior articular surface.  LIGAMENTS  Cruciates:  Intact ACL.  Complete PCL tear.  Collaterals: Medial collateral ligament is intact. Lateral collateral ligament complex is intact.  CARTILAGE  Patellofemoral: Partial-thickness cartilage loss of the patellofemoral compartment.  Medial:  No chondral defect.  Lateral: Partial-thickness cartilage loss of the lateral femoral condyle lateral tibial plateau. Subchondral reactive marrow edema in the posterior nonweightbearing surface of the lateral femoral condyles.  Joint: Small joint effusion. Normal Hoffa's fat. No plical thickening.  Popliteal Fossa:  No Baker cyst. Intact popliteus tendon.  Extensor Mechanism: Intact quadriceps tendon and patellar tendon. Prior lateral patellar retinacular release. Complete tear of the femoral attachment of the MPFL.  Bones:  No acute fracture or dislocation.  Other: No fluid collection or hematoma.  IMPRESSION: 1. Oblique tear of the posterior horn of the medial meniscus extending to the inferior articular surface. 2. Small horizontal tear of the body of the lateral meniscus extending to the superior articular  surface. 3. Partial-thickness cartilage loss of the lateral femoral condyle lateral tibial plateau. Subchondral reactive marrow edema in the posterior nonweightbearing surface of the lateral femoral condyles. 4. Complete tear of the femoral attachment of the MPFL.   Electronically Signed   By: Kathreen Devoid   On: 12/28/2016 09:50    Results for orders placed during the hospital encounter of 12/14/16  DG Knee Complete 4 Views Left   Narrative CLINICAL DATA:  Fall with left knee pain.  Initial encounter.  EXAM: LEFT KNEE - COMPLETE 4+ VIEW  COMPARISON:  Left knee MRI 11/13/2016  FINDINGS: Small crescentic bony structure in the posterior joint line. No visible donor site or joint effusion to suggest this is an acute fracture. Normal medial alignment. Early marginal spurring.  IMPRESSION: 1. Small bony structure in the posterior joint line that is favored chronic. No donor site or joint effusion to suggest acute avulsion fracture. 2. Mild degenerative spurring.   Electronically Signed   By: Monte Fantasia M.D.   On: 12/14/2016 11:21     Complexity Note: Imaging results reviewed. Results shared with Ms. Dumler, using Layman's terms.                         ROS  Cardiovascular History: No reported cardiovascular signs or symptoms such as High blood pressure, coronary artery disease, abnormal heart rate or rhythm, heart attack, blood thinner therapy or heart weakness and/or failure Pulmonary or Respiratory History: Shortness of breath, Snoring  and Coughing up mucus (Bronchitis) Neurological History: No reported neurological signs or symptoms such as seizures, abnormal skin sensations, urinary and/or fecal incontinence, being born with an abnormal open spine and/or a tethered spinal cord Review of Past Neurological Studies: No results found for this or any previous visit. Psychological-Psychiatric History: Anxiousness, Depressed, Prone to panicking and Difficulty sleeping  and or falling asleep Gastrointestinal History: Reflux or heatburn Genitourinary History: Passing kidney stones Hematological History: Brusing easily Endocrine History: No reported endocrine signs or symptoms such as high or low blood sugar, rapid heart rate due to high thyroid levels, obesity or weight gain due to slow thyroid or thyroid disease Rheumatologic History: No reported rheumatological signs and symptoms such as fatigue, joint pain, tenderness, swelling, redness, heat, stiffness, decreased range of motion, with or without associated rash Musculoskeletal History: Negative for myasthenia gravis, muscular dystrophy, multiple sclerosis or malignant hyperthermia Work History: Working full time  Allergies  Ms. Scogin is allergic to penicillins; chlorhexidine gluconate; ciprofloxacin; clindamycin/lincomycin; erythromycin; keflex [cephalexin]; nitrofurantoin monohyd macro; other; sulfa antibiotics; tetracyclines &  related; adhesive [tape]; latex; and vancomycin.  Laboratory Chemistry  Inflammation Markers (CRP: Acute Phase) (ESR: Chronic Phase) No results found for: CRP, ESRSEDRATE, LATICACIDVEN               Rheumatology Markers No results found for: RF, ANA, Therisa [place], [facility]              Renal Function Markers Lab Results  Component Value Date   BUN 15 01/04/2017   CREATININE 0.71 01/04/2017   GFRAA >60 01/04/2017   GFRNONAA >60 01/04/2017                 Hepatic Function Markers Lab Results  Component Value Date   AST 19 01/04/2017   ALT 13 (L) 01/04/2017   ALBUMIN 4.4 01/04/2017   ALKPHOS 69 01/04/2017   LIPASE 26 01/04/2017                 Electrolytes Lab Results  Component Value Date   NA 138 01/04/2017   K 4.2 01/04/2017   CL 102 01/04/2017   CALCIUM 9.4 01/04/2017                 Neuropathy Markers Lab Results  Component Value Date   VITAMINB12 840 12/31/2016   HGBA1C 5.6 12/31/2016   HIV NONREACTIVE 12/31/2016                  Bone Pathology Markers Lab Results  Component Value Date   VD25OH 56 12/31/2016                 Coagulation Parameters Lab Results  Component Value Date   INR 0.89 09/09/2016   LABPROT 12.0 09/09/2016   APTT 27 09/09/2016   PLT 328 01/04/2017                 Cardiovascular Markers Lab Results  Component Value Date   HGB 14.4 01/04/2017   HCT 43.6 01/04/2017                 CA Markers No results found for: CEA, CA125, LABCA2               Note: Lab results reviewed.  [place]  Drug: Ms. Shands  reports that she does not use drugs. Alcohol:  reports that she does not drink alcohol. Tobacco:  reports that she quit smoking about 33 years ago. Her smoking use included cigarettes. She started smoking about 35 years ago. She has a 1.50 pack-year smoking history. she has never used smokeless tobacco. Medical:  has a past medical history of Allergy, Anxiety, Depression, GERD (gastroesophageal reflux disease), History of kidney stones, Insomnia, Intermittent low back pain, Migraines, Osteoarthritis, Recurrent UTI, Restless leg syndrome, Sciatica of right side, Symptomatic menopausal or female climacteric states, Vertigo, and Vitamin D deficiency. Family: family history includes Cancer in her paternal grandmother.  Past Surgical History:  Procedure Laterality Date  . BACK SURGERY    . DIAGNOSTIC LAPAROSCOPY    . DILATION AND CURETTAGE OF UTERUS    . KNEE ARTHROSCOPY WITH MEDIAL MENISECTOMY Left 01/14/2017   Procedure: KNEE ARTHROSCOPY WITH MEDIAL AND LATERAL MENISECTOMY;  Surgeon: Hessie Knows, MD;  Location: [facility];  Service: Orthopedics;  Laterality: Left;  Partial Knee menisectomy  . KNEE SURGERY Left 05/2011   Dr. Rudene Christians- Arthroscopic  . LUMBAR LAMINECTOMY/DECOMPRESSION MICRODISCECTOMY N/A 09/16/2016   Procedure: LUMBAR LAMINECTOMY/DECOMPRESSION MICRODISCECTOMY 1 LEVEL L5-S1;  Surgeon: Meade Maw, MD;  Location: [facility];  Service: Neurosurgery;  Laterality:  N/A;  .  TONSILLECTOMY AND ADENOIDECTOMY    . URETHRAL STRICTURE DILATATION     Active Ambulatory Problems    Diagnosis Date Noted  . Moderate recurrent major depression ([place]) 12/27/2014  . Gastro-esophageal reflux disease without esophagitis 12/27/2014  . Bulge of lumbar disc without myelopathy 12/27/2014  . Dysmetabolic syndrome 96/22/2979  . Migraine without aura and responsive to treatment 12/27/2014  . Osteoarthrosis 12/27/2014  . Obesity (BMI 30-39.9) 12/27/2014  . Restless leg 12/27/2014  . Allergic rhinitis, seasonal 12/27/2014  . Right lumbar radiculitis 12/28/2014  . Hyperglycemia 04/06/2015  . Hematuria 04/09/2015  . Spondylolisthesis of lumbosacral region 02/11/2016   Resolved Ambulatory Problems    Diagnosis Date Noted  . Insomnia, persistent 12/27/2014  . Viral gastroenteritis 06/10/2015   Past Medical History:  Diagnosis Date  . Allergy   . Anxiety   . Depression   . GERD (gastroesophageal reflux disease)   . History of kidney stones   . Insomnia   . Intermittent low back pain   . Migraines   . Osteoarthritis   . Recurrent UTI   . Restless leg syndrome   . Sciatica of right side   . Symptomatic menopausal or female climacteric states   . Vertigo   . Vitamin D deficiency    Constitutional Exam  General appearance: Well nourished, well developed, and well hydrated. In no apparent acute distress Vitals:   06/17/17 1211  BP: 124/60  Pulse: 83  Resp: 16  Temp: 98.3 F (36.8 C)  SpO2: 100%  Weight: 240 lb (108.9 kg)  Height: _0  (1.651 m)   BMI Assessment: Estimated body mass index is 39.94 kg/m as calculated from the following:   Height as of this encounter: _1  (1.651 m).   Weight as of this encounter: 240 lb (108.9 kg).  BMI interpretation table: BMI level Category Range association with higher incidence of chronic pain  <18 kg/m2 Underweight   18.5-24.9 kg/m2 Ideal body weight   25-29.9 kg/m2 Overweight Increased incidence by 20%  30-34.9 kg/m2  Obese (Class I) Increased incidence by 68%  35-39.9 kg/m2 Severe obesity (Class II) Increased incidence by 136%  >40 kg/m2 Extreme obesity (Class III) Increased incidence by 254%   BMI Readings from Last 4 Encounters:  06/17/17 39.94 kg/m  06/01/17 40.60 kg/m  05/12/17 40.60 kg/m  05/10/17 40.47 kg/m   Wt Readings from Last 4 Encounters:  06/17/17 240 lb (108.9 kg)  06/01/17 244 lb (110.7 kg)  05/12/17 244 lb (110.7 kg)  05/10/17 243 lb 3.2 oz (110.3 kg)  Psych/Mental status: Alert, oriented x 3 (person, place, & time)       Eyes: PERLA Respiratory: No evidence of acute respiratory distress  Cervical Spine Area Exam  Skin & Axial Inspection: No masses, redness, edema, swelling, or associated skin lesions Alignment: Symmetrical Functional ROM: Unrestricted ROM      Stability: No instability detected Muscle Tone/Strength: Functionally intact. No obvious neuro-muscular anomalies detected. Sensory (Neurological): Unimpaired Palpation: No palpable anomalies              Upper Extremity (UE) Exam    Side: Right upper extremity  Side: Left upper extremity  Skin & Extremity Inspection: Skin color, temperature, and hair growth are WNL. No peripheral edema or cyanosis. No masses, redness, swelling, asymmetry, or associated skin lesions. No contractures.  Skin & Extremity Inspection: Skin color, temperature, and hair growth are WNL. No peripheral edema or cyanosis. No masses, redness, swelling, asymmetry, or associated skin lesions. No  contractures.  Functional ROM: Unrestricted ROM          Functional ROM: Unrestricted ROM          Muscle Tone/Strength: Functionally intact. No obvious neuro-muscular anomalies detected.  Muscle Tone/Strength: Functionally intact. No obvious neuro-muscular anomalies detected.  Sensory (Neurological): Unimpaired          Sensory (Neurological): Unimpaired          Palpation: No palpable anomalies              Palpation: No palpable anomalies               Specialized Test(s): Deferred         Specialized Test(s): Deferred          Thoracic Spine Area Exam  Skin & Axial Inspection: No masses, redness, or swelling Alignment: Symmetrical Functional ROM: Unrestricted ROM Stability: No instability detected Muscle Tone/Strength: Functionally intact. No obvious neuro-muscular anomalies detected. Sensory (Neurological): Unimpaired Muscle strength & Tone: No palpable anomalies  Lumbar Spine Area Exam  Skin & Axial Inspection: Well healed scar from previous spine surgery detected Alignment: Symmetrical Functional ROM: Mechanically restricted ROM      Stability: No instability detected Muscle Tone/Strength: Functionally intact. No obvious neuro-muscular anomalies detected. Sensory (Neurological): Articular pain pattern Palpation: Complains of area being tender to palpation Bilateral Fist Percussion Test Provocative Tests: Lumbar Hyperextension and rotation test: Positive bilaterally for facet joint pain. Lumbar Lateral bending test: Positive due to pain. Patrick's Maneuver: Positive for bilateral S-I arthralgia              Gait & Posture Assessment  Ambulation: Unassisted Gait: Relatively normal for age and body habitus Posture: WNL   Lower Extremity Exam    Side: Right lower extremity  Side: Left lower extremity  Skin & Extremity Inspection: Skin color, temperature, and hair growth are WNL. No peripheral edema or cyanosis. No masses, redness, swelling, asymmetry, or associated skin lesions. No contractures.  Skin & Extremity Inspection: Skin color, temperature, and hair growth are WNL. No peripheral edema or cyanosis. No masses, redness, swelling, asymmetry, or associated skin lesions. No contractures.  Functional ROM: Unrestricted ROM          Functional ROM: Unrestricted ROM          Muscle Tone/Strength: L5 weakness, S1 weakness  Muscle Tone/Strength: L5 weakness, S1 weakness  Sensory (Neurological): Paresthesia (Tingling sensation)   Sensory (Neurological): Paresthesia (Tingling sensation)  Palpation: No palpable anomalies  Palpation: No palpable anomalies   Assessment  Primary Diagnosis & Pertinent Problem List: The primary encounter diagnosis was Lumbar spondylosis. Diagnoses of Spondylolisthesis of lumbosacral region, Right lumbar radiculitis, and Lumbar degenerative disc disease were also pertinent to this visit.  Visit Diagnosis (New problems to examiner): 1. Lumbar spondylosis   2. Spondylolisthesis of lumbosacral region   3. Right lumbar radiculitis   4. Lumbar degenerative disc disease    Plan of Care (Initial workup plan)  Note: Please be advised that as per protocol, today's visit has been an evaluation only. We have not taken over the patient's controlled substance management.  General Recommendations: The pain condition that the patient suffers from is best treated with a multidisciplinary approach that involves an increase in physical activity to prevent de-conditioning and worsening of the pain cycle, as well as psychological counseling (formal and/or informal) to address the co-morbid psychological affects of pain. Treatment will often involve judicious use of pain medications and interventional procedures to decrease the pain, allowing the patient  to participate in the physical activity that will ultimately produce long-lasting pain reductions. The goal of the multidisciplinary approach is to return the patient to a higher level of overall function and to restore their ability to perform activities of daily living.  51 year old female who presents with axial low back pain that radiates to bilateral thighs and right greater than left leg pain.  Patient states that her pain is 95% back and 5% leg.  She has a history of a L5-S1 lumbar laminectomy and microdiscectomy and fusion done in March 2018.  Prior to this she had tried conservative therapy with transfemoral epidural steroid injections, physical therapy,  medication management which was not effective.  She presents today as a referral from Dr. Cari Caraway for discussion of spinal cord stimulator trial.  Had an extensive discussion with the patient about what spinal cord stimulation entails, the trial details, associated risks and intended benefits.  I showed her a spine model of epidural electrodes.  At this point, the patient states that the majority of her pain is axial lumbar spine rather than radicular.  Explained to her that spinal cord stimulation is most effective for radicular pain but can have some utility and axial low back pain as well.  Given that the patient has tried lumbar epidural steroid injections and they were not effective, we will not plan on repeating.  Patient does have facet arthropathy and lumbar degenerative disc disease on her most recent MRI most pronounced in the L2-L5 region.  I discussed doing lumbar medial branch nerve blocks with steroid to target her axial low back and buttock pain.  If this is effective, we can consider radiofrequency ablation of these nerves.  If this is not effective, we can further discuss spinal cord stimulation.  I have provided the patient resources for her to view in regards to spinal cord stimulation.  In regards to medication management, patient will trial diclofenac 75 mg twice daily along with tizanidine 4 mill grams twice daily as needed muscle spasms.  Risks and benefits of lumbar medial branch blocks were discussed and patient would like to proceed.  We will schedule her for bilateral L2, L3, L4 medial branch nerve blocks with steroid above the level of her L5-S1 laminectomy and microdiscectomy.  Plan: -Diclofenac and tizanidine prescriptions as below -Scheduled for lumbar facet medial branch nerve blocks from L2, L3, L4 bilaterally.  This will be done under sedation. -We will continue to discuss spinal cord stimulation if other conservative measures fail.  Workup will include referral to  psychologist for implant evaluation along with thoracic MRI to rule out thoracic canal stenosis. -Resources provided for spinal cord stimulation.  Instructed patient to view and at next visit will discuss any questions that she has about it. -Continue psychiatric care.  Ordered Lab-work, Procedure(s), Referral(s), & Consult(s): Orders Placed This Encounter  Procedures  . LUMBAR FACET(MEDIAL BRANCH NERVE BLOCK) MBNB   Pharmacotherapy (current): Medications ordered:  Meds ordered this encounter  Medications  . diclofenac (VOLTAREN) 75 MG EC tablet    Sig: Take 1 tablet (75 mg total) by mouth 2 (two) times daily.    Dispense:  60 tablet    Refill:  0  . tiZANidine (ZANAFLEX) 4 MG tablet    Sig: Take 1 tablet (4 mg total) by mouth 2 (two) times daily as needed for muscle spasms.    Dispense:  60 tablet    Refill:  1   Medications administered during this visit: [name] had no  medications administered during this visit.   Pharmacological management options:  Opioid Analgesics: The patient was informed that there is no guarantee that she would be a candidate for opioid analgesics. The decision will be made following CDC guidelines. This decision will be based on the results of diagnostic studies, as well as Ms. Barnett risk profile.   Membrane stabilizer: Tried gabapentin and Lyrica which were not effective.  Currently on Effexor for major depression per her psychiatrist at a dose of 225.  Muscle relaxant: Has tried Flexeril which was not effective.  Trial of tizanidine today.  NSAID: Meloxicam, naproxen, ibuprofen not effective.  We will trial diclofenac 75 mg twice daily today.  Other analgesic(s): To be determined at a later time   Interventional management options: Ms. Ricardo was informed that there is no guarantee that she would be a candidate for interventional therapies. The decision will be based on the results of diagnostic studies, as well as Ms. Lasecki risk  profile.  Procedure(s) under consideration:  -L2-L4 medial branch nerve blocks with sedation -Bilateral SI joint injections -Spinal cord stimulator trial   Provider-requested follow-up: No Follow-up on file.  Future Appointments  Date Time Provider [place]  06/21/2017 10:30 AM Gillis Santa, MD [facility] None  09/01/2017  2:30 PM Elvin So, MD [facility] None    Primary Care Physician: Steele Sizer, MD Location: [facility] Note by: Gillis Santa, M.D, Date: 06/17/2017; Time: 1:43 PM  Patient Instructions   1. Diclofenac 75 mg twice daily , take after meal 2. Tizanidine muscle relaxant, can take 4 mg up to twice daily as needed 3. Schedule for bilateral- L2, L3, L4, facet blocks with sedation 4. Follow up for procedure 5. I will give you resources regarding spinal cord stimulation  GENERAL RISKS AND COMPLICATIONS  What are the risk, side effects and possible complications? Generally speaking, most procedures are safe.  However, with any procedure there are risks, side effects, and the possibility of complications.  The risks and complications are dependent upon the sites that are lesioned, or the type of nerve block to be performed.  The closer the procedure is to the spine, the more serious the risks are.  Great care is taken when placing the radio frequency needles, block needles or lesioning probes, but sometimes complications can occur. 1. Infection: Any time there is an injection through the skin, there is a risk of infection.  This is why sterile conditions are used for these blocks.  There are four possible types of infection. 1. Localized skin infection. 2. Central Nervous System Infection-This can be in the form of Meningitis, which can be deadly. 3. Epidural Infections-This can be in the form of an epidural abscess, which can cause pressure inside of the spine, causing compression of the spinal cord with subsequent paralysis.  This would require an emergency surgery to decompress, and there are no guarantees that the patient would recover from the paralysis. 4. Discitis-This is an infection of the intervertebral discs.  It occurs in about 1% of discography procedures.  It is difficult to treat and it may lead to surgery.        2. Pain: the needles have to go through skin and soft tissues, will cause soreness.       3. Damage to internal structures:  The nerves to be lesioned may be near blood vessels or    other nerves which can be potentially damaged.       4. Bleeding: Bleeding is  more common if the patient is taking blood thinners such as  aspirin, Coumadin, Ticiid, Plavix, etc., or if he/she have some genetic predisposition  such as hemophilia. Bleeding into the spinal canal can cause compression of the spinal  cord with subsequent paralysis.  This would require an emergency surgery to  decompress and there are no guarantees that the patient would recover from the  paralysis.       5. Pneumothorax:  Puncturing of a lung is a possibility, every time a needle is introduced in  the area of the chest or upper back.  Pneumothorax refers to free air around the  collapsed lung(s), inside of the thoracic cavity (chest cavity).  Another two possible  complications related to a similar event would include: Hemothorax and Chylothorax.   These are variations of the Pneumothorax, where instead of air around the collapsed  lung(s), you may have blood or chyle, respectively.       6. Spinal headaches: They may occur with any procedures in the area of the spine.       7. Persistent CSF (Cerebro-Spinal Fluid) leakage: This is a rare problem, but may occur  with prolonged intrathecal or epidural catheters either due to the formation of a fistulous  track or a dural tear.       8. Nerve damage: By working so close to the spinal cord, there is always a possibility of  nerve damage, which could be as serious as a permanent spinal cord injury  with  paralysis.       9. Death:  Although rare, severe deadly allergic reactions known as "Anaphylactic  reaction" can occur to any of the medications used.      10. Worsening of the symptoms:  We can always make thing worse.  What are the chances of something like this happening? Chances of any of this occuring are extremely low.  By statistics, you have more of a chance of getting killed in a motor vehicle accident: while driving to the hospital than any of the above occurring .  Nevertheless, you should be aware that they are possibilities.  In general, it is similar to taking a shower.  Everybody knows that you can slip, hit your head and get killed.  Does that mean that you should not shower again?  Nevertheless always keep in mind that statistics do not mean anything if you happen to be on the wrong side of them.  Even if a procedure has a 1 (one) in a 1,000,000 (million) chance of going wrong, it you happen to be that one..Also, keep in mind that by statistics, you have more of a chance of having something go wrong when taking medications.  Who should not have this procedure? If you are on a blood thinning medication (e.g. Coumadin, Plavix, see list of "Blood Thinners"), or if you have an active infection going on, you should not have the procedure.  If you are taking any blood thinners, please inform your physician.  How should I prepare for this procedure?  Do not eat or drink anything at least six hours prior to the procedure.  Bring a driver with you .  It cannot be a taxi.  Come accompanied by an adult that can drive you back, and that is strong enough to help you if your legs get weak or numb from the local anesthetic.  Take all of your medicines the morning of the procedure with just enough water to swallow them.  If  you have diabetes, make sure that you are scheduled to have your procedure done first thing in the morning, whenever possible.  If you have diabetes, take only half  of your insulin dose and notify our nurse that you have done so as soon as you arrive at the clinic.  If you are diabetic, but only take blood sugar pills (oral hypoglycemic), then do not take them on the morning of your procedure.  You may take them after you have had the procedure.  Do not take aspirin or any aspirin-containing medications, at least eleven (11) days prior to the procedure.  They may prolong bleeding.  Wear loose fitting clothing that may be easy to take off and that you would not mind if it got stained with Betadine or blood.  Do not wear any jewelry or perfume  Remove any nail coloring.  It will interfere with some of our monitoring equipment.  NOTE: Remember that this is not meant to be interpreted as a complete list of all possible complications.  Unforeseen problems may occur.  BLOOD THINNERS The following drugs contain aspirin or other products, which can cause increased bleeding during surgery and should not be taken for 2 weeks prior to and 1 week after surgery.  If you should need take something for relief of minor pain, you may take acetaminophen which is found in Tylenol,m Datril, Anacin-3 and Panadol. It is not blood thinner. The products listed below are.  Do not take any of the products listed below in addition to any listed on your instruction sheet.  A.P.C or A.P.C with Codeine Codeine Phosphate Capsules #3 Ibuprofen Ridaura  ABC compound Congesprin Imuran rimadil  Advil Cope Indocin Robaxisal  Alka-Seltzer Effervescent Pain Reliever and Antacid Coricidin or Coricidin-D  Indomethacin Rufen  Alka-Seltzer plus Cold Medicine Cosprin Ketoprofen S-A-C Tablets  Anacin Analgesic Tablets or Capsules Coumadin Korlgesic Salflex  Anacin Extra Strength Analgesic tablets or capsules CP-2 Tablets Lanoril Salicylate  Anaprox Cuprimine Capsules Levenox Salocol  Anexsia-D Dalteparin Magan Salsalate  Anodynos Darvon compound Magnesium Salicylate Sine-off  Ansaid Dasin  Capsules Magsal Sodium Salicylate  Anturane Depen Capsules Marnal Soma  APF Arthritis pain formula Dewitt's Pills Measurin Stanback  Argesic Dia-Gesic Meclofenamic Sulfinpyrazone  Arthritis Bayer Timed Release Aspirin Diclofenac Meclomen Sulindac  Arthritis pain formula Anacin Dicumarol Medipren Supac  Analgesic (Safety coated) Arthralgen Diffunasal Mefanamic Suprofen  Arthritis Strength Bufferin Dihydrocodeine Mepro Compound Suprol  Arthropan liquid Dopirydamole Methcarbomol with Aspirin Synalgos  ASA tablets/Enseals Disalcid Micrainin Tagament  Ascriptin Doan's Midol Talwin  Ascriptin A/D Dolene Mobidin Tanderil  Ascriptin Extra Strength Dolobid Moblgesic Ticlid  Ascriptin with Codeine Doloprin or Doloprin with Codeine Momentum Tolectin  Asperbuf Duoprin Mono-gesic Trendar  Aspergum Duradyne Motrin or Motrin IB Triminicin  Aspirin plain, buffered or enteric coated Durasal Myochrisine Trigesic  Aspirin Suppositories Easprin Nalfon Trillsate  Aspirin with Codeine Ecotrin Regular or Extra Strength Naprosyn Uracel  Atromid-S Efficin Naproxen Ursinus  Auranofin Capsules Elmiron Neocylate Vanquish  Axotal Emagrin Norgesic Verin  Azathioprine Empirin or Empirin with Codeine Normiflo Vitamin E  Azolid Emprazil Nuprin Voltaren  Bayer Aspirin plain, buffered or children's or timed BC Tablets or powders Encaprin Orgaran Warfarin Sodium  Buff-a-Comp Enoxaparin Orudis Zorpin  Buff-a-Comp with Codeine Equegesic Os-Cal-Gesic   Buffaprin Excedrin plain, buffered or Extra Strength Oxalid   Bufferin Arthritis Strength Feldene Oxphenbutazone   Bufferin plain or Extra Strength Feldene Capsules Oxycodone with Aspirin   Bufferin with Codeine Fenoprofen Fenoprofen Pabalate or Pabalate-SF   Buffets II Flogesic  Panagesic   Buffinol plain or Extra Strength Florinal or Florinal with Codeine Panwarfarin   Buf-Tabs Flurbiprofen Penicillamine   Butalbital Compound Four-way cold tablets Penicillin    Butazolidin Fragmin Pepto-Bismol   Carbenicillin Geminisyn Percodan   Carna Arthritis Reliever Geopen Persantine   Carprofen Gold's salt Persistin   Chloramphenicol Goody's Phenylbutazone   Chloromycetin Haltrain Piroxlcam   Clmetidine heparin Plaquenil   Cllnoril Hyco-pap Ponstel   Clofibrate Hydroxy chloroquine Propoxyphen         Before stopping any of these medications, be sure to consult the physician who ordered them.  Some, such as Coumadin (Warfarin) are ordered to prevent or treat serious conditions such as "deep thrombosis", "pumonary embolisms", and other heart problems.  The amount of time that you may need off of the medication may also vary with the medication and the reason for which you were taking it.  If you are taking any of these medications, please make sure you notify your pain physician before you undergo any procedures.          Facet Joint Block The facet joints connect the bones of the spine (vertebrae). They make it possible for you to bend, twist, and make other movements with your spine. They also keep you from bending too far, twisting too far, and making other excessive movements. A facet joint block is a procedure where a numbing medicine (anesthetic) is injected into a facet joint. Often, a type of anti-inflammatory medicine called a steroid is also injected. A facet joint block may be done to diagnose neck or back pain. If the pain gets better after a facet joint block, it means the pain is probably coming from the facet joint. If the pain does not get better, it means the pain is probably not coming from the facet joint. A facet joint block may also be done to relieve neck or back pain caused by an inflamed facet joint. A facet joint block is only done to relieve pain if the pain does not improve with other methods, such as medicine, exercise programs, and physical therapy. Tell a health care provider about:  Any allergies you have.  All medicines you  are taking, including vitamins, herbs, eye drops, creams, and over-the-counter medicines.  Any problems you or family members have had with anesthetic medicines.  Any blood disorders you have.  Any surgeries you have had.  Any medical conditions you have.  Whether you are pregnant or may be pregnant. What are the risks? Generally, this is a safe procedure. However, problems may occur, including:  Bleeding.  Injury to a nerve near the injection site.  Pain at the injection site.  Weakness or numbness in areas controlled by nerves near the injection site.  Infection.  Temporary fluid retention.  Allergic reactions to medicines or dyes.  Injury to other structures or organs near the injection site.  What happens before the procedure?  Follow instructions from your health care provider about eating or drinking restrictions.  Ask your health care provider about: ? Changing or stopping your regular medicines. This is especially important if you are taking diabetes medicines or blood thinners. ? Taking medicines such as aspirin and ibuprofen. These medicines can thin your blood. Do not take these medicines before your procedure if your health care provider instructs you not to.  Do not take any new dietary supplements or medicines without asking your health care provider first.  Plan to have someone take you home after the procedure. What happens  during the procedure?  You may need to remove your clothing and dress in an open-back gown.  The procedure will be done while you are lying on an X-ray table. You will most likely be asked to lie on your stomach, but you may be asked to lie in a different position if an injection will be made in your neck.  Machines will be used to monitor your oxygen levels, heart rate, and blood pressure.  If an injection will be made in your neck, an IV tube will be inserted into one of your veins. Fluids and medicine will flow directly into your  body through the IV tube.  The area over the facet joint where the injection will be made will be cleaned with soap. The surrounding skin will be covered with clean drapes.  A numbing medicine (local anesthetic) will be applied to your skin. Your skin may sting or burn for a moment.  A video X-ray machine (fluoroscopy) will be used to locate the joint. In some cases, a CT scan may be used.  A contrast dye may be injected into the facet joint area to help locate the joint.  When the joint is located, an anesthetic will be injected into the joint through the needle.  Your health care provider will ask you whether you feel pain relief. If you do feel relief, a steroid may be injected to provide pain relief for a longer period of time. If you do not feel relief or feel only partial relief, additional injections of an anesthetic may be made in other facet joints.  The needle will be removed.  Your skin will be cleaned.  A bandage (dressing) will be applied over each injection site. The procedure may vary among health care providers and hospitals. What happens after the procedure?  You will be observed for 15-30 minutes before being allowed to go home. This information is not intended to replace advice given to you by your health care provider. Make sure you discuss any questions you have with your health care provider. Document Released: 11/11/2006 Document Revised: 07/24/2015 Document Reviewed: 03/18/2015 Elsevier Interactive Patient Education  [employer].

## 2017-06-17 NOTE — Progress Notes (Signed)
Safety precautions to be maintained throughout the outpatient stay will include: orient to surroundings, keep bed in low position, maintain call bell within reach at all times, provide assistance with transfer out of bed and ambulation.  

## 2017-06-17 NOTE — Patient Instructions (Addendum)
1. Diclofenac 75 mg twice daily , take after meal 2. Tizanidine muscle relaxant, can take 4 mg up to twice daily as needed 3. Schedule for bilateral- L2, L3, L4, facet blocks with sedation 4. Follow up for procedure 5. I will give you resources regarding spinal cord stimulation  GENERAL RISKS AND COMPLICATIONS  What are the risk, side effects and possible complications? Generally speaking, most procedures are safe.  However, with any procedure there are risks, side effects, and the possibility of complications.  The risks and complications are dependent upon the sites that are lesioned, or the type of nerve block to be performed.  The closer the procedure is to the spine, the more serious the risks are.  Great care is taken when placing the radio frequency needles, block needles or lesioning probes, but sometimes complications can occur. 1. Infection: Any time there is an injection through the skin, there is a risk of infection.  This is why sterile conditions are used for these blocks.  There are four possible types of infection. 1. Localized skin infection. 2. Central Nervous System Infection-This can be in the form of Meningitis, which can be deadly. 3. Epidural Infections-This can be in the form of an epidural abscess, which can cause pressure inside of the spine, causing compression of the spinal cord with subsequent paralysis. This would require an emergency surgery to decompress, and there are no guarantees that the patient would recover from the paralysis. 4. Discitis-This is an infection of the intervertebral discs.  It occurs in about 1% of discography procedures.  It is difficult to treat and it may lead to surgery.        2. Pain: the needles have to go through skin and soft tissues, will cause soreness.       3. Damage to internal structures:  The nerves to be lesioned may be near blood vessels or    other nerves which can be potentially damaged.       4. Bleeding: Bleeding is more  common if the patient is taking blood thinners such as  aspirin, Coumadin, Ticiid, Plavix, etc., or if he/she have some genetic predisposition  such as hemophilia. Bleeding into the spinal canal can cause compression of the spinal  cord with subsequent paralysis.  This would require an emergency surgery to  decompress and there are no guarantees that the patient would recover from the  paralysis.       5. Pneumothorax:  Puncturing of a lung is a possibility, every time a needle is introduced in  the area of the chest or upper back.  Pneumothorax refers to free air around the  collapsed lung(s), inside of the thoracic cavity (chest cavity).  Another two possible  complications related to a similar event would include: Hemothorax and Chylothorax.   These are variations of the Pneumothorax, where instead of air around the collapsed  lung(s), you may have blood or chyle, respectively.       6. Spinal headaches: They may occur with any procedures in the area of the spine.       7. Persistent CSF (Cerebro-Spinal Fluid) leakage: This is a rare problem, but may occur  with prolonged intrathecal or epidural catheters either due to the formation of a fistulous  track or a dural tear.       8. Nerve damage: By working so close to the spinal cord, there is always a possibility of  nerve damage, which could be as serious as a permanent  spinal cord injury with  paralysis.       9. Death:  Although rare, severe deadly allergic reactions known as "Anaphylactic  reaction" can occur to any of the medications used.      10. Worsening of the symptoms:  We can always make thing worse.  What are the chances of something like this happening? Chances of any of this occuring are extremely low.  By statistics, you have more of a chance of getting killed in a motor vehicle accident: while driving to the hospital than any of the above occurring .  Nevertheless, you should be aware that they are possibilities.  In general, it is  similar to taking a shower.  Everybody knows that you can slip, hit your head and get killed.  Does that mean that you should not shower again?  Nevertheless always keep in mind that statistics do not mean anything if you happen to be on the wrong side of them.  Even if a procedure has a 1 (one) in a 1,000,000 (million) chance of going wrong, it you happen to be that one..Also, keep in mind that by statistics, you have more of a chance of having something go wrong when taking medications.  Who should not have this procedure? If you are on a blood thinning medication (e.g. Coumadin, Plavix, see list of "Blood Thinners"), or if you have an active infection going on, you should not have the procedure.  If you are taking any blood thinners, please inform your physician.  How should I prepare for this procedure?  Do not eat or drink anything at least six hours prior to the procedure.  Bring a driver with you .  It cannot be a taxi.  Come accompanied by an adult that can drive you back, and that is strong enough to help you if your legs get weak or numb from the local anesthetic.  Take all of your medicines the morning of the procedure with just enough water to swallow them.  If you have diabetes, make sure that you are scheduled to have your procedure done first thing in the morning, whenever possible.  If you have diabetes, take only half of your insulin dose and notify our nurse that you have done so as soon as you arrive at the clinic.  If you are diabetic, but only take blood sugar pills (oral hypoglycemic), then do not take them on the morning of your procedure.  You may take them after you have had the procedure.  Do not take aspirin or any aspirin-containing medications, at least eleven (11) days prior to the procedure.  They may prolong bleeding.  Wear loose fitting clothing that may be easy to take off and that you would not mind if it got stained with Betadine or blood.  Do not wear any  jewelry or perfume  Remove any nail coloring.  It will interfere with some of our monitoring equipment.  NOTE: Remember that this is not meant to be interpreted as a complete list of all possible complications.  Unforeseen problems may occur.  BLOOD THINNERS The following drugs contain aspirin or other products, which can cause increased bleeding during surgery and should not be taken for 2 weeks prior to and 1 week after surgery.  If you should need take something for relief of minor pain, you may take acetaminophen which is found in Tylenol,m Datril, Anacin-3 and Panadol. It is not blood thinner. The products listed below are.  Do not take  any of the products listed below in addition to any listed on your instruction sheet.  A.P.C or A.P.C with Codeine Codeine Phosphate Capsules #3 Ibuprofen Ridaura  ABC compound Congesprin Imuran rimadil  Advil Cope Indocin Robaxisal  Alka-Seltzer Effervescent Pain Reliever and Antacid Coricidin or Coricidin-D  Indomethacin Rufen  Alka-Seltzer plus Cold Medicine Cosprin Ketoprofen S-A-C Tablets  Anacin Analgesic Tablets or Capsules Coumadin Korlgesic Salflex  Anacin Extra Strength Analgesic tablets or capsules CP-2 Tablets Lanoril Salicylate  Anaprox Cuprimine Capsules Levenox Salocol  Anexsia-D Dalteparin Magan Salsalate  Anodynos Darvon compound Magnesium Salicylate Sine-off  Ansaid Dasin Capsules Magsal Sodium Salicylate  Anturane Depen Capsules Marnal Soma  APF Arthritis pain formula Dewitt's Pills Measurin Stanback  Argesic Dia-Gesic Meclofenamic Sulfinpyrazone  Arthritis Bayer Timed Release Aspirin Diclofenac Meclomen Sulindac  Arthritis pain formula Anacin Dicumarol Medipren Supac  Analgesic (Safety coated) Arthralgen Diffunasal Mefanamic Suprofen  Arthritis Strength Bufferin Dihydrocodeine Mepro Compound Suprol  Arthropan liquid Dopirydamole Methcarbomol with Aspirin Synalgos  ASA tablets/Enseals Disalcid Micrainin Tagament  Ascriptin Doan's  Midol Talwin  Ascriptin A/D Dolene Mobidin Tanderil  Ascriptin Extra Strength Dolobid Moblgesic Ticlid  Ascriptin with Codeine Doloprin or Doloprin with Codeine Momentum Tolectin  Asperbuf Duoprin Mono-gesic Trendar  Aspergum Duradyne Motrin or Motrin IB Triminicin  Aspirin plain, buffered or enteric coated Durasal Myochrisine Trigesic  Aspirin Suppositories Easprin Nalfon Trillsate  Aspirin with Codeine Ecotrin Regular or Extra Strength Naprosyn Uracel  Atromid-S Efficin Naproxen Ursinus  Auranofin Capsules Elmiron Neocylate Vanquish  Axotal Emagrin Norgesic Verin  Azathioprine Empirin or Empirin with Codeine Normiflo Vitamin E  Azolid Emprazil Nuprin Voltaren  Bayer Aspirin plain, buffered or children's or timed BC Tablets or powders Encaprin Orgaran Warfarin Sodium  Buff-a-Comp Enoxaparin Orudis Zorpin  Buff-a-Comp with Codeine Equegesic Os-Cal-Gesic   Buffaprin Excedrin plain, buffered or Extra Strength Oxalid   Bufferin Arthritis Strength Feldene Oxphenbutazone   Bufferin plain or Extra Strength Feldene Capsules Oxycodone with Aspirin   Bufferin with Codeine Fenoprofen Fenoprofen Pabalate or Pabalate-SF   Buffets II Flogesic Panagesic   Buffinol plain or Extra Strength Florinal or Florinal with Codeine Panwarfarin   Buf-Tabs Flurbiprofen Penicillamine   Butalbital Compound Four-way cold tablets Penicillin   Butazolidin Fragmin Pepto-Bismol   Carbenicillin Geminisyn Percodan   Carna Arthritis Reliever Geopen Persantine   Carprofen Gold's salt Persistin   Chloramphenicol Goody's Phenylbutazone   Chloromycetin Haltrain Piroxlcam   Clmetidine heparin Plaquenil   Cllnoril Hyco-pap Ponstel   Clofibrate Hydroxy chloroquine Propoxyphen         Before stopping any of these medications, be sure to consult the physician who ordered them.  Some, such as Coumadin (Warfarin) are ordered to prevent or treat serious conditions such as "deep thrombosis", "pumonary embolisms", and other heart  problems.  The amount of time that you may need off of the medication may also vary with the medication and the reason for which you were taking it.  If you are taking any of these medications, please make sure you notify your pain physician before you undergo any procedures.          Facet Joint Block The facet joints connect the bones of the spine (vertebrae). They make it possible for you to bend, twist, and make other movements with your spine. They also keep you from bending too far, twisting too far, and making other excessive movements. A facet joint block is a procedure where a numbing medicine (anesthetic) is injected into a facet joint. Often, a type of anti-inflammatory medicine called a  steroid is also injected. A facet joint block may be done to diagnose neck or back pain. If the pain gets better after a facet joint block, it means the pain is probably coming from the facet joint. If the pain does not get better, it means the pain is probably not coming from the facet joint. A facet joint block may also be done to relieve neck or back pain caused by an inflamed facet joint. A facet joint block is only done to relieve pain if the pain does not improve with other methods, such as medicine, exercise programs, and physical therapy. Tell a health care provider about:  Any allergies you have.  All medicines you are taking, including vitamins, herbs, eye drops, creams, and over-the-counter medicines.  Any problems you or family members have had with anesthetic medicines.  Any blood disorders you have.  Any surgeries you have had.  Any medical conditions you have.  Whether you are pregnant or may be pregnant. What are the risks? Generally, this is a safe procedure. However, problems may occur, including:  Bleeding.  Injury to a nerve near the injection site.  Pain at the injection site.  Weakness or numbness in areas controlled by nerves near the injection  site.  Infection.  Temporary fluid retention.  Allergic reactions to medicines or dyes.  Injury to other structures or organs near the injection site.  What happens before the procedure?  Follow instructions from your health care provider about eating or drinking restrictions.  Ask your health care provider about: ? Changing or stopping your regular medicines. This is especially important if you are taking diabetes medicines or blood thinners. ? Taking medicines such as aspirin and ibuprofen. These medicines can thin your blood. Do not take these medicines before your procedure if your health care provider instructs you not to.  Do not take any new dietary supplements or medicines without asking your health care provider first.  Plan to have someone take you home after the procedure. What happens during the procedure?  You may need to remove your clothing and dress in an open-back gown.  The procedure will be done while you are lying on an X-ray table. You will most likely be asked to lie on your stomach, but you may be asked to lie in a different position if an injection will be made in your neck.  Machines will be used to monitor your oxygen levels, heart rate, and blood pressure.  If an injection will be made in your neck, an IV tube will be inserted into one of your veins. Fluids and medicine will flow directly into your body through the IV tube.  The area over the facet joint where the injection will be made will be cleaned with soap. The surrounding skin will be covered with clean drapes.  A numbing medicine (local anesthetic) will be applied to your skin. Your skin may sting or burn for a moment.  A video X-ray machine (fluoroscopy) will be used to locate the joint. In some cases, a CT scan may be used.  A contrast dye may be injected into the facet joint area to help locate the joint.  When the joint is located, an anesthetic will be injected into the joint through the  needle.  Your health care provider will ask you whether you feel pain relief. If you do feel relief, a steroid may be injected to provide pain relief for a longer period of time. If you do not feel  relief or feel only partial relief, additional injections of an anesthetic may be made in other facet joints.  The needle will be removed.  Your skin will be cleaned.  A bandage (dressing) will be applied over each injection site. The procedure may vary among health care providers and hospitals. What happens after the procedure?  You will be observed for 15-30 minutes before being allowed to go home. This information is not intended to replace advice given to you by your health care provider. Make sure you discuss any questions you have with your health care provider. Document Released: 11/11/2006 Document Revised: 07/24/2015 Document Reviewed: 03/18/2015 [employer] Interactive Patient Education  [employer].

## 2017-06-20 ENCOUNTER — Encounter: Payer: Self-pay | Admitting: Family Medicine

## 2017-06-21 ENCOUNTER — Other Ambulatory Visit: Payer: Self-pay | Admitting: Family Medicine

## 2017-06-21 ENCOUNTER — Ambulatory Visit
Admission: RE | Admit: 2017-06-21 | Discharge: 2017-06-21 | Disposition: A | Discharge status: Home / Self Care | Payer: 59 | Source: Ambulatory Visit | Attending: Student in an Organized Health Care Education/Training Program | Admitting: Student in an Organized Health Care Education/Training Program

## 2017-06-21 ENCOUNTER — Encounter: Payer: Self-pay | Admitting: Student in an Organized Health Care Education/Training Program

## 2017-06-21 ENCOUNTER — Ambulatory Visit (HOSPITAL_BASED_OUTPATIENT_CLINIC_OR_DEPARTMENT_OTHER): Payer: 59 | Admitting: Student in an Organized Health Care Education/Training Program

## 2017-06-21 ENCOUNTER — Other Ambulatory Visit: Payer: Self-pay

## 2017-06-21 VITALS — BP 120/69 | HR 84 | Temp 97.4°F | Resp 18 | Ht 65.0 in | Wt 244.0 lb

## 2017-06-21 DIAGNOSIS — F419 Anxiety disorder, unspecified: Secondary | ICD-10-CM | POA: Insufficient documentation

## 2017-06-21 DIAGNOSIS — Z882 Allergy status to sulfonamides status: Secondary | ICD-10-CM | POA: Insufficient documentation

## 2017-06-21 DIAGNOSIS — Z881 Allergy status to other antibiotic agents status: Secondary | ICD-10-CM | POA: Diagnosis not present

## 2017-06-21 DIAGNOSIS — Z91040 Latex allergy status: Secondary | ICD-10-CM | POA: Insufficient documentation

## 2017-06-21 DIAGNOSIS — R61 Generalized hyperhidrosis: Secondary | ICD-10-CM

## 2017-06-21 DIAGNOSIS — Z883 Allergy status to other anti-infective agents status: Secondary | ICD-10-CM | POA: Diagnosis not present

## 2017-06-21 DIAGNOSIS — Z880 Allergy status to penicillin: Secondary | ICD-10-CM | POA: Insufficient documentation

## 2017-06-21 DIAGNOSIS — M47816 Spondylosis without myelopathy or radiculopathy, lumbar region: Secondary | ICD-10-CM | POA: Insufficient documentation

## 2017-06-21 DIAGNOSIS — M533 Sacrococcygeal disorders, not elsewhere classified: Secondary | ICD-10-CM | POA: Diagnosis present

## 2017-06-21 DIAGNOSIS — Z79899 Other long term (current) drug therapy: Secondary | ICD-10-CM | POA: Insufficient documentation

## 2017-06-21 DIAGNOSIS — G2581 Restless legs syndrome: Secondary | ICD-10-CM

## 2017-06-21 MED ORDER — DEXAMETHASONE SODIUM PHOSPHATE 10 MG/ML IJ SOLN
10.0000 mg | Freq: Once | INTRAMUSCULAR | Status: AC
  Administered 2017-06-21: 10 mg
  Filled 2017-06-21: qty 1

## 2017-06-21 MED ORDER — PRAMIPEXOLE DIHYDROCHLORIDE 1.5 MG PO TABS: ORAL_TABLET | ORAL | 0 refills | Status: DC

## 2017-06-21 MED ORDER — ROPIVACAINE HCL 2 MG/ML IJ SOLN
10.0000 mL | Freq: Once | INTRAMUSCULAR | Status: AC
  Administered 2017-06-21: 10 mL
  Filled 2017-06-21: qty 10

## 2017-06-21 MED ORDER — FENTANYL CITRATE (PF) 100 MCG/2ML IJ SOLN
25.0000 ug | INTRAMUSCULAR | Status: AC | PRN
  Administered 2017-06-21: 25 ug via INTRAVENOUS
  Administered 2017-06-21: 50 ug via INTRAVENOUS
  Administered 2017-06-21: 25 ug via INTRAVENOUS
  Filled 2017-06-21: qty 2

## 2017-06-21 MED ORDER — CLONIDINE HCL 0.1 MG PO TABS: ORAL_TABLET | ORAL | 0 refills | Status: DC

## 2017-06-21 MED ORDER — LACTATED RINGERS IV SOLN
1000.0000 mL | Freq: Once | INTRAVENOUS | Status: AC
  Administered 2017-06-21: 1000 mL via INTRAVENOUS

## 2017-06-21 MED ORDER — LIDOCAINE HCL (PF) 1 % IJ SOLN
10.0000 mL | Freq: Once | INTRAMUSCULAR | Status: AC
  Administered 2017-06-21: 5 mL
  Filled 2017-06-21: qty 10

## 2017-06-21 NOTE — Progress Notes (Signed)
Patient's Name: Kelly [name]  MRN: [id number]  Referring Provider: Steele Sizer, MD  DOB: [birth year]  PCP: Steele Sizer, MD  DOS: 06/21/2017  Note by: Gillis Santa, MD  Service setting: Ambulatory outpatient  Specialty: Interventional Pain Management  Patient type: Established  Location: [facility] (AMB) Pain Management Facility  Visit type: Interventional Procedure   Primary Reason for Visit: Interventional Pain Management Treatment. CC: Tailbone Pain (middle)  Procedure:  Anesthesia, Analgesia, Anxiolysis:  Type: Diagnostic Medial Branch Facet Block with steroid Region: Lumbar Level: L2, L3, L4 Medial Branch Level(s) Laterality: Bilateral  Type: Local Anesthesia with Moderate (Conscious) Sedation Local Anesthetic: Lidocaine 1% Route: Intravenous (IV) IV Access: Secured Sedation: Meaningful verbal contact was maintained at all times during the procedure  Indication(s): Analgesia and Anxiety   Indications: 1. Lumbar spondylosis    Pain Score: Pre-procedure: 9 /10 Post-procedure: 0-No pain/10  Pre-op Assessment:  Kelly [name] is a 51 y.o. (year old), female patient, seen today for interventional treatment. She  has a past surgical history that includes Tonsillectomy and adenoidectomy; Urethra dilation; Knee surgery (Left, 05/2011); Lumbar laminectomy/decompression microdiscectomy (N/A, 09/16/2016); Back surgery; Diagnostic laparoscopy; Dilation and curettage of uterus; and Knee arthroscopy with medial menisectomy (Left, 01/14/2017). Kelly [name] has a current medication list which includes the following prescription(s): diclofenac, epinephrine, loratadine, omeprazole, ranitidine, trazodone, triamcinolone, venlafaxine xr, clonidine, hydroxyzine, pramipexole, tizanidine, and vitamin d (ergocalciferol). Her primarily concern today is the Tailbone Pain (middle)  Initial Vital Signs: There were no vitals taken for this visit. BMI: Estimated body mass index is 40.6 kg/m as calculated from  the following:   Height as of this encounter: 5\' 5"  (1.651 m).   Weight as of this encounter: 244 lb (110.7 kg).  Risk Assessment: Allergies: Reviewed. She is allergic to penicillins; chlorhexidine gluconate; ciprofloxacin; clindamycin/lincomycin; erythromycin; keflex [cephalexin]; nitrofurantoin monohyd macro; other; sulfa antibiotics; tetracyclines & related; adhesive [tape]; latex; and vancomycin.  Allergy Precautions: None required Coagulopathies: Reviewed. None identified.  Blood-thinner therapy: None at this time Active Infection(s): Reviewed. None identified. Kelly [name] is afebrile  Site Confirmation: Kelly [name] was asked to confirm the procedure and laterality before marking the site Procedure checklist: Completed Consent: Before the procedure and under the influence of no sedative(s), amnesic(s), or anxiolytics, the patient was informed of the treatment options, risks and possible complications. To fulfill our ethical and legal obligations, as recommended by the American Medical Association's Code of Ethics, I have informed the patient of my clinical impression; the nature and purpose of the treatment or procedure; the risks, benefits, and possible complications of the intervention; the alternatives, including doing nothing; the risk(s) and benefit(s) of the alternative treatment(s) or procedure(s); and the risk(s) and benefit(s) of doing nothing. The patient was provided information about the general risks and possible complications associated with the procedure. These may include, but are not limited to: failure to achieve desired goals, infection, bleeding, organ or nerve damage, allergic reactions, paralysis, and death. In addition, the patient was informed of those risks and complications associated to Spine-related procedures, such as failure to decrease pain; infection (i.e.: Meningitis, epidural or intraspinal abscess); bleeding (i.e.: epidural hematoma, subarachnoid hemorrhage,  or any other type of intraspinal or peri-dural bleeding); organ or nerve damage (i.e.: Any type of peripheral nerve, nerve root, or spinal cord injury) with subsequent damage to sensory, motor, and/or autonomic systems, resulting in permanent pain, numbness, and/or weakness of one or several areas of the body; allergic reactions; (i.e.: anaphylactic reaction); and/or death. Furthermore, the patient was informed of  those risks and complications associated with the medications. These include, but are not limited to: allergic reactions (i.e.: anaphylactic or anaphylactoid reaction(s)); adrenal axis suppression; blood sugar elevation that in diabetics may result in ketoacidosis or comma; water retention that in patients with history of congestive heart failure may result in shortness of breath, pulmonary edema, and decompensation with resultant heart failure; weight gain; swelling or edema; medication-induced neural toxicity; particulate matter embolism and blood vessel occlusion with resultant organ, and/or nervous system infarction; and/or aseptic necrosis of one or more joints. Finally, the patient was informed that Medicine is not an exact science; therefore, there is also the possibility of unforeseen or unpredictable risks and/or possible complications that may result in a catastrophic outcome. The patient indicated having understood very clearly. We have given the patient no guarantees and we have made no promises. Enough time was given to the patient to ask questions, all of which were answered to the patient's satisfaction. Kelly [name] has indicated that she wanted to continue with the procedure. Attestation: I, the ordering provider, attest that I have discussed with the patient the benefits, risks, side-effects, alternatives, likelihood of achieving goals, and potential problems during recovery for the procedure that I have provided informed consent. Date: 06/21/2017; Time: 11:02 AM  Pre-Procedure  Preparation:  Monitoring: As per clinic protocol. Respiration, ETCO2, SpO2, BP, heart rate and rhythm monitor placed and checked for adequate function Safety Precautions: Patient was assessed for positional comfort and pressure points before starting the procedure. Time-out: I initiated and conducted the "Time-out" before starting the procedure, as per protocol. The patient was asked to participate by confirming the accuracy of the "Time Out" information. Verification of the correct person, site, and procedure were performed and confirmed by me, the nursing staff, and the patient. "Time-out" conducted as per Joint Commission's Universal Protocol (UP.01.01.01). "Time-out" Date & Time: 06/21/2017; 1148 hrs.  Description of Procedure Process:   Position: Prone Target Area: For Lumbar Facet blocks, the target is the groove formed by the junction of the transverse process and superior articular process. Approach: Paramedial approach. Area Prepped: Entire Posterior Lumbosacral Region Prepping solution: ChloraPrep (2% chlorhexidine gluconate and 70% isopropyl alcohol) Safety Precautions: Aspiration looking for blood return was conducted prior to all injections. At no point did we inject any substances, as a needle was being advanced. No attempts were made at seeking any paresthesias. Safe injection practices and needle disposal techniques used. Medications properly checked for expiration dates. SDV (single dose vial) medications used. Description of the Procedure: Protocol guidelines were followed. The patient was placed in position over the fluoroscopy table. The target area was identified and the area prepped in the usual manner. Skin desensitized using vapocoolant spray. Skin & deeper tissues infiltrated with local anesthetic. Appropriate amount of time allowed to pass for local anesthetics to take effect. The procedure needle was introduced through the skin, ipsilateral to the reported pain, and advanced  to the target area. Employing the "Medial Branch Technique", the needles were advanced to the angle made by the superior and medial portion of the transverse process, and the lateral and inferior portion of the superior articulating process of the targeted vertebral bodies. This area is known as "Burton's Eye" or the "Eye of the [place] Dog".  Negative aspiration confirmed. Solution injected in intermittent fashion, asking for systemic symptoms every 0.5cc of injectate. The needles were then removed and the area cleansed, making sure to leave some of the prepping solution back to take advantage of its long  term bactericidal properties.   Illustration of the posterior view of the lumbar spine and the posterior neural structures. Laminae of L2 through S1 are labeled. DPRL5, dorsal primary ramus of L5; DPRS1, dorsal primary ramus of S1; DPR3, dorsal primary ramus of L3; FJ, facet (zygapophyseal) joint L3-L4; I, inferior articular process of L4; LB1, lateral branch of dorsal primary ramus of L1; IAB, inferior articular branches from L3 medial branch (supplies L4-L5 facet joint); IBP, intermediate branch plexus; MB3, medial branch of dorsal primary ramus of L3; NR3, third lumbar nerve root; S, superior articular process of L5; SAB, superior articular branches from L4 (supplies L4-5 facet joint also); TP3, transverse process of L3.  Vitals:   06/21/17 1210 06/21/17 1217 06/21/17 1227 06/21/17 1237  BP: 125/62 105/80 125/72 120/69  Pulse:      Resp: 19 15 20 18   Temp:  (!) 97.4 F (36.3 C)    SpO2: 100% 99% 100% 99%  Weight:      Height:        Start Time: 1148 hrs. End Time: 1210 hrs. Materials:  Needle(s) Type: Regular needle Gauge: 22G Length: 3.5-in Medication(s): We administered lactated ringers, fentaNYL, lidocaine (PF), ropivacaine (PF) 2 mg/mL (0.2%), and dexamethasone. Please see chart orders for dosing details. 6 cc solution made a 5 cc of 0.2% ropivacaine, 1 cc of Decadron (10 mg/cc).  1  cc injected at each level bilaterally.  Imaging Guidance (Spinal):  Type of Imaging Technique: Fluoroscopy Guidance (Spinal) Indication(s): Assistance in needle guidance and placement for procedures requiring needle placement in or near specific anatomical locations not easily accessible without such assistance. Exposure Time: Please see nurses notes. Contrast: None used. Fluoroscopic Guidance: I was personally present during the use of fluoroscopy. "Tunnel Vision Technique" used to obtain the best possible view of the target area. Parallax error corrected before commencing the procedure. "Direction-depth-direction" technique used to introduce the needle under continuous pulsed fluoroscopy. Once target was reached, antero-posterior, oblique, and lateral fluoroscopic projection used confirm needle placement in all planes. Images permanently stored in EMR. Interpretation: No contrast injected. I personally interpreted the imaging intraoperatively. Adequate needle placement confirmed in multiple planes. Permanent images saved into the patient's record.  Antibiotic Prophylaxis:  Indication(s): None identified Antibiotic given: None  Post-operative Assessment:  EBL: None Complications: No immediate post-treatment complications observed by team, or reported by patient. Note: The patient tolerated the entire procedure well. A repeat set of vitals were taken after the procedure and the patient was kept under observation following institutional policy, for this type of procedure. Post-procedural neurological assessment was performed, showing return to baseline, prior to discharge. The patient was provided with post-procedure discharge instructions, including a section on how to identify potential problems. Should any problems arise concerning this procedure, the patient was given instructions to immediately contact us, at any time, without hesitation. In any case, we plan to contact the patient by telephone  for a follow-up status report regarding this interventional procedure. Comments:  No additional relevant information. 5 out of 5 strength bilateral lower extremity: Plantar flexion, dorsiflexion, knee flexion, knee extension.  Plan of Care    Imaging Orders     DG C-Arm 1-60 Min-No Report Procedure Orders    No procedure(s) ordered today    Medications ordered for procedure: Meds ordered this encounter  Medications  . lactated ringers infusion 1,000 mL  . fentaNYL (SUBLIMAZE) injection 25-100 mcg    Make sure Narcan is available in the pyxis when using this medication. In the  event of respiratory depression (RR< 8/min): Titrate NARCAN (naloxone) in increments of 0.1 to 0.2 mg IV at 2-3 minute intervals, until desired degree of reversal.  . lidocaine (PF) (XYLOCAINE) 1 % injection 10 mL  . ropivacaine (PF) 2 mg/mL (0.2%) (NAROPIN) injection 10 mL  . dexamethasone (DECADRON) injection 10 mg   Medications administered: We administered lactated ringers, fentaNYL, lidocaine (PF), ropivacaine (PF) 2 mg/mL (0.2%), and dexamethasone.  See the medical record for exact dosing, route, and time of administration.  This SmartLink is deprecated. Use AVSMEDLIST instead to display the medication list for a patient. Disposition: Discharge home  Discharge Date & Time: 06/21/2017; 1241 hrs.   Physician-requested Follow-up: Return in about 4 weeks (around 07/19/2017) for Post Procedure Evaluation. Future Appointments  Date Time Provider [place]  06/25/2017 11:20 AM Steele Sizer, MD [facility]  07/22/2017 11:30 AM Gillis Santa, MD [facility] None  09/01/2017  2:30 PM Elvin So, MD [facility] None   Primary Care Physician: Steele Sizer, MD Location: [facility] Facility Note by: Gillis Santa, MD Date: 06/21/2017; Time: 2:58 PM  Disclaimer:  Medicine is not an exact science. The only guarantee in medicine is that nothing is guaranteed. It is important to  note that the decision to proceed with this intervention was based on the information collected from the patient. The Data and conclusions were drawn from the patient's questionnaire, the interview, and the physical examination. Because the information was provided in large part by the patient, it cannot be guaranteed that it has not been purposely or unconsciously manipulated. Every effort has been made to obtain as much relevant data as possible for this evaluation. It is important to note that the conclusions that lead to this procedure are derived in large part from the available data. Always take into account that the treatment will also be dependent on availability of resources and existing treatment guidelines, considered by other Pain Management Practitioners as being common knowledge and practice, at the time of the intervention. For Medico-Legal purposes, it is also important to point out that variation in procedural techniques and pharmacological choices are the acceptable norm. The indications, contraindications, technique, and results of the above procedure should only be interpreted and judged by a Board-Certified Interventional Pain Specialist with extensive familiarity and expertise in the same exact procedure and technique.

## 2017-06-21 NOTE — Patient Instructions (Signed)

## 2017-06-22 ENCOUNTER — Telehealth: Payer: Self-pay | Admitting: *Deleted

## 2017-06-22 NOTE — Telephone Encounter (Signed)
Denies complications post procedure. 

## 2017-06-25 ENCOUNTER — Encounter: Payer: Self-pay | Admitting: Family Medicine

## 2017-06-25 ENCOUNTER — Ambulatory Visit (INDEPENDENT_AMBULATORY_CARE_PROVIDER_SITE_OTHER): Payer: 59 | Admitting: Family Medicine

## 2017-06-25 ENCOUNTER — Other Ambulatory Visit: Payer: Self-pay | Admitting: Family Medicine

## 2017-06-25 VITALS — BP 134/82 | HR 112 | Temp 98.3°F | Resp 18 | Ht 65.0 in | Wt 245.8 lb

## 2017-06-25 DIAGNOSIS — F331 Major depressive disorder, recurrent, moderate: Secondary | ICD-10-CM | POA: Diagnosis not present

## 2017-06-25 DIAGNOSIS — G43009 Migraine without aura, not intractable, without status migrainosus: Secondary | ICD-10-CM | POA: Diagnosis not present

## 2017-06-25 DIAGNOSIS — M5442 Lumbago with sciatica, left side: Secondary | ICD-10-CM

## 2017-06-25 DIAGNOSIS — E8881 Metabolic syndrome: Secondary | ICD-10-CM | POA: Diagnosis not present

## 2017-06-25 DIAGNOSIS — M5441 Lumbago with sciatica, right side: Secondary | ICD-10-CM | POA: Diagnosis not present

## 2017-06-25 DIAGNOSIS — G2581 Restless legs syndrome: Secondary | ICD-10-CM

## 2017-06-25 DIAGNOSIS — Z9889 Other specified postprocedural states: Secondary | ICD-10-CM

## 2017-06-25 DIAGNOSIS — R61 Generalized hyperhidrosis: Secondary | ICD-10-CM

## 2017-06-25 DIAGNOSIS — Z23 Encounter for immunization: Secondary | ICD-10-CM

## 2017-06-25 DIAGNOSIS — F411 Generalized anxiety disorder: Secondary | ICD-10-CM

## 2017-06-25 DIAGNOSIS — G8929 Other chronic pain: Secondary | ICD-10-CM | POA: Diagnosis not present

## 2017-06-25 DIAGNOSIS — R739 Hyperglycemia, unspecified: Secondary | ICD-10-CM

## 2017-06-25 MED ORDER — CLONIDINE HCL 0.1 MG PO TABS: ORAL_TABLET | ORAL | 1 refills | Status: DC

## 2017-06-25 MED ORDER — SEMAGLUTIDE(0.25 OR 0.5MG/DOS) 2 MG/1.5ML ~~LOC~~ SOPN: 0.5000 mg | PEN_INJECTOR | SUBCUTANEOUS | 0 refills | Status: DC

## 2017-06-25 MED ORDER — PRAMIPEXOLE DIHYDROCHLORIDE 1.5 MG PO TABS: ORAL_TABLET | ORAL | 1 refills | Status: DC

## 2017-06-25 MED ORDER — TOPIRAMATE 50 MG PO TABS: 50.0000 mg | ORAL_TABLET | Freq: Two times a day (BID) | ORAL | 0 refills | Status: DC

## 2017-06-25 NOTE — Progress Notes (Signed)
Name: Kelly [name]   MRN: [id number]    DOB: 1965/10/20   Date:06/25/2017       Progress Note  Subjective  Chief Complaint  Chief Complaint  Patient presents with  . Medication Refill    6 month F/U  . Fatigue    Onset-few weeks, started a new job in [employer]. Has been very tired and sleeping around 8 hours nightly  . Migraine    Has had a migraine for the past couple of days that she has not been able to get rid of.  . Gastroesophageal Reflux    Doing better  . Insomnia    Had a nerve block and doing better with the pain-able to rest easier. Still having the RLS and keeping her up at night occasionally    HPI   Major Depression: she is seeing Dr. Einar Grad and is doing better, but still under a lot of stress, waiting to start a new job, husband unemployed and they are struggling financially.   Hyperglycemia: she denies polyphagia, polydipsia or polyuria, she is morbidly obese, last lipid was normal. 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 She finished seeing Dr. Phyllis Ginger, had back surgery 09/2016 with Dr. Cari Caraway. She is off Gabapentin, could not tolerate Lyrica, she is having spinal blocks done by Dr. Holley Raring  and is not sure if it is helping yet  Migraine Headaches:  She states she has noticed worsening with increase in stress at home and also barometric pressure changes. . Associated with nausea, phonophobia but no photophobia. Pain is usually temporal and radiates to nuchal area, but having more facial pressure lately   Snoring: she states she wakes up feeling tired at times, snores loudly and at times she wakes up gasping at night. She had sleep study and was negative for OSA, she has RLS, not responding much to Mirapex anymore, wakes up at night with symptoms.   Hot Flashes: clonidine is helping  with symptoms  GERD: taking Pantoprazole in am's and back on Ranitidine qhs to control symptoms, no longer on prn regiment, she states stress is higher and symptoms gets worse  Left knee pain: seeing Dr. Cari Caraway at [facility] and had surgery done but still has pain and instability she would like to have a second opinion   Morbid obesity: she has been gradually gained weight over the years, today' is her highest weight at 245.8lbs. She is eating healthier, but not very active because of her pain.   Patient Active Problem List   Diagnosis Date Noted  . Spondylolisthesis of lumbosacral region 02/11/2016  . Hematuria 04/09/2015  . Hyperglycemia 04/06/2015  . Right lumbar radiculitis 12/28/2014  . Moderate recurrent major depression ([place]) 12/27/2014  . Gastro-esophageal reflux disease without esophagitis 12/27/2014  . Bulge of lumbar disc without myelopathy 12/27/2014  . Dysmetabolic syndrome 40/98/1191  . Migraine without aura and responsive to treatment 12/27/2014  . Osteoarthrosis 12/27/2014  . Obesity (BMI 30-39.9) 12/27/2014  . Restless leg 12/27/2014  . Allergic rhinitis, seasonal 12/27/2014    Past Surgical History:  Procedure Laterality Date  . BACK SURGERY    . DIAGNOSTIC LAPAROSCOPY    . DILATION AND CURETTAGE OF UTERUS    . KNEE ARTHROSCOPY WITH MEDIAL MENISECTOMY Left 01/14/2017   Procedure: KNEE ARTHROSCOPY WITH MEDIAL AND LATERAL MENISECTOMY;  Surgeon: Hessie Knows, MD;  Location: [facility] ORS;  Service: Orthopedics;  Laterality: Left;  Partial Knee menisectomy  . KNEE SURGERY Left 05/2011   Dr. Rudene Christians- Arthroscopic  . LUMBAR LAMINECTOMY/DECOMPRESSION MICRODISCECTOMY N/A 09/16/2016   Procedure: LUMBAR LAMINECTOMY/DECOMPRESSION MICRODISCECTOMY 1 LEVEL L5-S1;  Surgeon: Meade Maw, MD;  Location: [facility] ORS;  Service: Neurosurgery;  Laterality: N/A;  . TONSILLECTOMY AND ADENOIDECTOMY    . URETHRAL STRICTURE DILATATION      Family History  Problem Relation Age of  Onset  . Cancer Paternal Grandmother     Social History   Socioeconomic History  . Marital status: Married    Spouse name: Jenny Reichmann  . Number of children: 2  . Years of education: Not on file  . Highest education level: Associate degree: occupational, Hotel manager, or vocational program  Social Needs  . Financial resource strain: Very hard  . Food insecurity - worry: Often true  . Food insecurity - inability: Often true  . Transportation needs - medical: No  . Transportation needs - non-medical: Yes  Occupational History  . Occupation: not employed  Tobacco Use  . Smoking status: Former Smoker    Packs/day: 0.75    Years: 2.00    Pack years: 1.50    Types: Cigarettes    Start date: 07/06/1981    Last attempt to quit: 07/07/1983    Years since quitting: 33.9  . Smokeless tobacco: Never Used  Substance and Sexual Activity  . Alcohol use: No    Alcohol/week: 0.0 oz  . Drug use: No  . Sexual activity: Yes    Partners: Male  Other Topics Concern  . Not on file  Social History Narrative  . Not on file     Current Outpatient Medications:  .  cloNIDine (CATAPRES) 0.1 MG tablet, TAKE 1 TABLET (0.1 MG TOTAL) BY MOUTH AT BEDTIME AS NEEDED., Disp: 30 tablet, Rfl: 0 .  diclofenac (VOLTAREN) 75 MG EC tablet, Take 1 tablet (75 mg total) by mouth 2 (two) times daily., Disp: 60 tablet, Rfl: 0 .  EPINEPHrine 0.3 mg/0.3 mL IJ SOAJ injection, Inject into the muscle., Disp: , Rfl:  .  hydrOXYzine (ATARAX/VISTARIL) 25 MG tablet, Take 1 tablet (25 mg total) 2 (two) times daily as needed by mouth., Disp: 60 tablet, Rfl: 1 .  loratadine (CLARITIN) 10 MG tablet, Take 1 tablet (10 mg total) by mouth daily., Disp: 90 tablet, Rfl: 5 .  omeprazole (PRILOSEC) 40 MG capsule, Take 1 capsule (40 mg total) by mouth every morning., Disp: 90 capsule, Rfl: 5 .  pramipexole (MIRAPEX) 1.5 MG tablet, TAKE 1 TABLET BY MOUTH EVERY EVENING FOR RLS, Disp: 90 tablet, Rfl: 0 .  ranitidine (ZANTAC) 150 MG tablet, Take 1  tablet (150 mg total) by mouth at bedtime., Disp: 90 tablet, Rfl: 5 .  tiZANidine (ZANAFLEX) 4 MG tablet, Take 1 tablet (4 mg total) by mouth 2 (two) times daily as needed for muscle spasms., Disp: 60 tablet, Rfl: 1 .  traZODone (DESYREL) 100 MG tablet, Take 2 tablets (200 mg total) by mouth at bedtime as needed for sleep. (Patient taking differently: Take 150 mg by mouth at bedtime as needed for sleep. ), Disp: 60 tablet, Rfl: 2 .  triamcinolone (NASACORT ALLERGY 24HR) 55 MCG/ACT AERO nasal inhaler, Place 2 sprays into the nose at bedtime., Disp: 1 Inhaler, Rfl: 5 .  venlafaxine XR (EFFEXOR-XR) 150 MG 24 hr capsule, Take 1 capsule (150 mg total) by mouth daily., Disp: 30 capsule, Rfl: 2  Allergies  Allergen Reactions  . Penicillins Anaphylaxis, Hives, Nausea And Vomiting and Swelling    Has patient had a PCN reaction causing immediate rash, facial/tongue/throat swelling, SOB or lightheadedness with hypotension: Yes Has patient had a PCN reaction causing severe rash involving mucus membranes or skin necrosis: Yes Has patient had a PCN reaction that required hospitalization No Has patient had a PCN reaction occurring within the last 10 years: Yes If all of the above answers are "NO", then may proceed with Cephalosporin use.   . Chlorhexidine Gluconate Itching and Rash  . Ciprofloxacin Hives  . Clindamycin/Lincomycin Hives  . Erythromycin Hives and Nausea And Vomiting  . Keflex [Cephalexin] Hives  . Lyrica [Pregabalin]     Dizziness, syncope  . Nitrofurantoin Monohyd Macro Hives and Nausea And Vomiting  . Other Hives  . Sulfa Antibiotics Hives and Nausea And Vomiting    Rapid heart rate  . Tetracyclines & Related Hives  . Adhesive [Tape] Rash  . Latex Rash  . Vancomycin Itching and Rash     ROS  Constitutional: Negative for fever or weight change.  Respiratory: Negative for cough and shortness of breath.   Cardiovascular: Negative for chest pain or palpitations.  Gastrointestinal:  Negative for abdominal pain, no bowel changes.  Musculoskeletal: Positive  for gait problem or joint swelling.  Skin: Negative for rash.  Neurological: Negative for dizziness , positive for  headache.  No other specific complaints in a complete review of systems (except as listed in HPI above).  Objective  Vitals:   06/25/17 1136  BP: 134/82  Pulse: (!) 112  Resp: 18  Temp: 98.3 F (36.8 C)  TempSrc: Oral  SpO2: 98%  Weight: 245 lb 12.8 oz (111.5 kg)  Height: 5\' 5"  (1.651 m)    Body mass index is 40.9 kg/m.  Physical Exam  Constitutional: Patient appears well-developed and well-nourished. Obese No distress.  HEENT: head atraumatic, normocephalic, pupils equal and reactive to light, ears normal TM bilaterally,  neck supple, throat within normal limits, tender during percussion of all sinus.  Cardiovascular: Normal rate, regular rhythm and normal heart sounds.  No murmur heard. No BLE edema. Pulmonary/Chest: Effort normal and breath sounds normal. No respiratory distress. Abdominal: Soft.  There is no tenderness. Psychiatric: Patient has a normal mood and affect. behavior is normal. Judgment and thought content normal. Muscular Skeletal: pain during palpation of lumbar spine, no effusion left knee, pain with extension and crepitus    PHQ2/9: Depression screen [facility] 2/9 06/25/2017 06/21/2017 06/17/2017 10/19/2016 06/17/2016  Decreased Interest 1 0 0 2 2  Down, Depressed, Hopeless 1 0 0 3 1  PHQ - 2 Score 2 0 0 5 3  Altered sleeping 2 - - 3 3  Tired, decreased energy 2 - - 3 3  Change in appetite 3 - - 3 3  Feeling bad or failure about yourself  1 - - 2 3  Trouble concentrating 0 - - 2 1  Moving slowly or fidgety/restless 2 - - 3 1  Suicidal thoughts 0 - - 0 0  PHQ-9 Score 12 - - 21 17  Difficult doing work/chores Somewhat difficult - - Very difficult Very difficult     Fall Risk: Fall Risk  06/25/2017 06/21/2017 06/17/2017 10/19/2016 06/17/2016  Falls in the past year?  Yes No No No No  Number falls in past yr: 1 - - - -  Comment - - - - -  Injury with Fall? Yes - - - -  Comment - - - - -  Follow up - - - - -     Functional Status Survey: Is the patient deaf or have difficulty hearing?: Yes(Tinnitus in bilateral ears) Does the patient have difficulty seeing, even when wearing glasses/contacts?: No Does the patient have difficulty concentrating, remembering, or making decisions?: No Does the patient have difficulty walking or climbing stairs?: Yes(Knee Pain) Does the patient have difficulty dressing or bathing?: No Does the patient have difficulty doing errands alone such as visiting a doctor's office or shopping?: No   Assessment & Plan  1. Dysmetabolic syndrome  Discussed medication and she is willing to try Ozempic  2. Need for immunization against influenza  - Flu Vaccine QUAD 6+ mos PF IM (Fluarix Quad PF)  3. Night sweats  - cloNIDine (CATAPRES) 0.1 MG tablet; TAKE 1 TABLET (0.1 MG TOTAL) BY MOUTH AT BEDTIME AS NEEDED.  Dispense: 90 tablet; Refill: 1  4. Restless leg  - pramipexole (MIRAPEX) 1.5 MG tablet; TAKE 1 TABLET BY MOUTH EVERY EVENING FOR RLS  Dispense: 90 tablet; Refill: 1  5. Hyperglycemia  - Semaglutide (OZEMPIC) 0.25 or 0.5 MG/DOSE SOPN; Inject 0.5 mg into the skin once a week.  Dispense: 2 pen; Refill: 0  6. Migraine without aura and responsive to treatment  Cannot tolerate Gabapentin or Lyrica  7. Moderate recurrent major depression ([place])  Doing better under the care of Dr. Einar Grad  8. GAD (generalized anxiety disorder)  On Hydroxyzine   9. Chronic low back pain with bilateral sciatica, unspecified back pain laterality  Continue follow up with Dr. Cari Caraway   10. Morbid obesity ([place])  Discussed with the patient the risk posed by an increased BMI. Discussed importance of portion control, calorie counting and at least 150 minutes of physical activity weekly. Avoid sweet beverages and drink more water. Eat at  least 6 servings of fruit and vegetables daily   11. History of arthroscopy of left knee  Referral Ortho for second opinion

## 2017-07-01 ENCOUNTER — Other Ambulatory Visit: Payer: Self-pay

## 2017-07-03 ENCOUNTER — Other Ambulatory Visit: Payer: Self-pay | Admitting: Psychiatry

## 2017-07-03 ENCOUNTER — Other Ambulatory Visit: Payer: Self-pay | Admitting: Student in an Organized Health Care Education/Training Program

## 2017-07-03 DIAGNOSIS — R61 Generalized hyperhidrosis: Secondary | ICD-10-CM

## 2017-07-03 DIAGNOSIS — G2581 Restless legs syndrome: Secondary | ICD-10-CM

## 2017-07-07 ENCOUNTER — Telehealth: Payer: Self-pay

## 2017-07-07 ENCOUNTER — Other Ambulatory Visit: Payer: Self-pay | Admitting: Family Medicine

## 2017-07-07 MED ORDER — DULAGLUTIDE 1.5 MG/0.5ML ~~LOC~~ SOAJ: 1.5000 mg | SUBCUTANEOUS | 2 refills | Status: DC

## 2017-07-07 NOTE — Progress Notes (Signed)
Changed to Trulicity

## 2017-07-07 NOTE — Telephone Encounter (Signed)
Insurance does not cover Ozempic but possible alternatives are Trulicity, Victoza, Bydureon and Tanzeum.

## 2017-07-13 NOTE — Telephone Encounter (Signed)
[facility] called states they need to get a rx refill on pt hydroxyzine. pt next appt is 09-01-17 pt was last seen on  06-01-17 pt is a dr. Einar Grad pt.   hydrOXYzine (ATARAX/VISTARIL) 25 MG tablet 60 tablet 1 05/10/2017    Sig - Route: Take 1 tablet (25 mg total) 2 (two) times daily as needed by mouth. - Oral   Sent to pharmacy as: hydrOXYzine (ATARAX/VISTARIL) 25 MG tablet   E-Prescribing Status: Receipt confirmed by pharmacy (05/10/2017 5:01 PM EST)

## 2017-07-15 ENCOUNTER — Other Ambulatory Visit: Payer: Self-pay | Admitting: Nurse Practitioner

## 2017-07-15 ENCOUNTER — Telehealth: Payer: Self-pay

## 2017-07-15 MED ORDER — DICLOFENAC SODIUM 75 MG PO TBEC: 75.0000 mg | DELAYED_RELEASE_TABLET | Freq: Two times a day (BID) | ORAL | 0 refills | Status: AC

## 2017-07-15 NOTE — Telephone Encounter (Signed)
Hi please have her to verify which pharmacy (3 in system) I will be glad to. I have verified C[facility] is [facility]. She needs to make sure that she returns for her F/U as scheduled  thanks

## 2017-07-15 NOTE — Telephone Encounter (Signed)
Patient notified that Rx will be called and to be sure and keep f/up appt with Dr Holley Raring.

## 2017-07-15 NOTE — Telephone Encounter (Signed)
Same pharmacy that the first Rx went to.  I will call and make sure she keeps her f/up appt.

## 2017-07-15 NOTE — Telephone Encounter (Deleted)
Pt called and states He has the same insurance but they will only pay for 7days of meds, could you check on this for her and call back

## 2017-07-15 NOTE — Telephone Encounter (Signed)
Spoke with pharmacy and they are requesting refill on Voltaren 75 mg tablets.  I did tell the representative that DR Holley Raring is out of the office until Monday but that I would forward the message to Dionisio David, NP and if she feels comfortable filling she would escribe them, otherwise it would be Monday before we can the refill.

## 2017-07-15 NOTE — Telephone Encounter (Signed)
Pharmacy wants call back regarding meds

## 2017-07-22 ENCOUNTER — Other Ambulatory Visit: Payer: Self-pay

## 2017-07-22 ENCOUNTER — Encounter: Payer: Self-pay | Admitting: Student in an Organized Health Care Education/Training Program

## 2017-07-22 ENCOUNTER — Ambulatory Visit
Payer: 59 | Attending: Student in an Organized Health Care Education/Training Program | Admitting: Student in an Organized Health Care Education/Training Program

## 2017-07-22 VITALS — BP 105/53 | HR 95 | Temp 98.4°F | Resp 18 | Ht 65.0 in | Wt 244.0 lb

## 2017-07-22 DIAGNOSIS — M4726 Other spondylosis with radiculopathy, lumbar region: Secondary | ICD-10-CM | POA: Diagnosis not present

## 2017-07-22 DIAGNOSIS — M545 Low back pain: Secondary | ICD-10-CM | POA: Diagnosis present

## 2017-07-22 DIAGNOSIS — M47816 Spondylosis without myelopathy or radiculopathy, lumbar region: Secondary | ICD-10-CM | POA: Diagnosis not present

## 2017-07-22 DIAGNOSIS — Z881 Allergy status to other antibiotic agents status: Secondary | ICD-10-CM | POA: Diagnosis not present

## 2017-07-22 DIAGNOSIS — M51369 Other intervertebral disc degeneration, lumbar region without mention of lumbar back pain or lower extremity pain: Secondary | ICD-10-CM

## 2017-07-22 DIAGNOSIS — G8929 Other chronic pain: Secondary | ICD-10-CM | POA: Diagnosis present

## 2017-07-22 DIAGNOSIS — Z79899 Other long term (current) drug therapy: Secondary | ICD-10-CM | POA: Diagnosis not present

## 2017-07-22 DIAGNOSIS — Z880 Allergy status to penicillin: Secondary | ICD-10-CM | POA: Diagnosis not present

## 2017-07-22 DIAGNOSIS — M5416 Radiculopathy, lumbar region: Secondary | ICD-10-CM | POA: Diagnosis not present

## 2017-07-22 DIAGNOSIS — Z6830 Body mass index (BMI) 30.0-30.9, adult: Secondary | ICD-10-CM | POA: Insufficient documentation

## 2017-07-22 DIAGNOSIS — E669 Obesity, unspecified: Secondary | ICD-10-CM | POA: Insufficient documentation

## 2017-07-22 DIAGNOSIS — M5136 Other intervertebral disc degeneration, lumbar region: Secondary | ICD-10-CM | POA: Diagnosis not present

## 2017-07-22 DIAGNOSIS — Z87891 Personal history of nicotine dependence: Secondary | ICD-10-CM | POA: Insufficient documentation

## 2017-07-22 DIAGNOSIS — E559 Vitamin D deficiency, unspecified: Secondary | ICD-10-CM | POA: Diagnosis not present

## 2017-07-22 DIAGNOSIS — Z87442 Personal history of urinary calculi: Secondary | ICD-10-CM | POA: Insufficient documentation

## 2017-07-22 DIAGNOSIS — M4317 Spondylolisthesis, lumbosacral region: Secondary | ICD-10-CM | POA: Diagnosis not present

## 2017-07-22 DIAGNOSIS — Z87440 Personal history of urinary (tract) infections: Secondary | ICD-10-CM | POA: Diagnosis not present

## 2017-07-22 DIAGNOSIS — G2581 Restless legs syndrome: Secondary | ICD-10-CM | POA: Diagnosis not present

## 2017-07-22 DIAGNOSIS — F329 Major depressive disorder, single episode, unspecified: Secondary | ICD-10-CM | POA: Insufficient documentation

## 2017-07-22 DIAGNOSIS — F339 Major depressive disorder, recurrent, unspecified: Secondary | ICD-10-CM | POA: Diagnosis not present

## 2017-07-22 DIAGNOSIS — M5116 Intervertebral disc disorders with radiculopathy, lumbar region: Secondary | ICD-10-CM | POA: Diagnosis not present

## 2017-07-22 DIAGNOSIS — F419 Anxiety disorder, unspecified: Secondary | ICD-10-CM | POA: Insufficient documentation

## 2017-07-22 DIAGNOSIS — K219 Gastro-esophageal reflux disease without esophagitis: Secondary | ICD-10-CM | POA: Diagnosis not present

## 2017-07-22 NOTE — Progress Notes (Signed)
Patient's Name: Kelly [name]  MRN: [id number]  Referring Provider: Steele Sizer, MD  DOB: 1965/09/16  PCP: Steele Sizer, MD  DOS: 07/22/2017  Note by: Gillis Santa, MD  Service setting: Ambulatory outpatient  Specialty: Interventional Pain Management  Location: [facility] (AMB) Pain Management Facility    Patient type: Established   Primary Reason(s) for Visit: Encounter for post-procedure evaluation of chronic illness with mild to moderate exacerbation CC: Back Pain (lower)  HPI  Kelly [name] is a 52 y.o. year old, female patient, who comes today for a post-procedure evaluation. She has Moderate recurrent major depression ([place]); Gastro-esophageal reflux disease without esophagitis; Bulge of lumbar disc without myelopathy; Dysmetabolic syndrome; Migraine without aura and responsive to treatment; Osteoarthrosis; Obesity (BMI 30-39.9); Restless leg; Allergic rhinitis, seasonal; Right lumbar radiculitis; Hyperglycemia; Hematuria; and Spondylolisthesis of lumbosacral region on their problem list. Her primarily concern today is the Back Pain (lower)  Pain Assessment: Location: Lower Back Radiating: both legs Onset: More than a month ago Duration: Chronic pain Quality: Numbness, Sharp(aggravating) Severity: 3 /10 (self-reported pain score)  Note: Reported level is compatible with observation.                         When using our objective Pain Scale, levels between 6 and 10/10 are said to belong in an emergency room, as it progressively worsens from a 6/10, described as severely limiting, requiring emergency care not usually available at an outpatient pain management facility. At a 6/10 level, communication becomes difficult and requires great effort. Assistance to reach the emergency department may be required. Facial flushing and profuse sweating along with potentially dangerous increases in heart rate and blood pressure will be evident. Effect on ADL:   Timing: Constant Modifying factors:  hot bath  Kelly [name] comes in today for post-procedure evaluation after the treatment done on 07/03/2017.  Further details on both, my assessment(s), as well as the proposed treatment plan, please see below.  Post-Procedure Assessment  07/03/2017 Procedure: Bilateral L2, 3, 4 medial branch facet block with steroid Pre-procedure pain score:  9/10 Post-procedure pain score: 0/10         Influential Factors: BMI: 40.60 kg/m Intra-procedural challenges: None observed.         Assessment challenges: None detected.              Reported side-effects: None.        Post-procedural adverse reactions or complications: None reported         Sedation: Please see nurses note. When no sedatives are used, the analgesic levels obtained are directly associated to the effectiveness of the local anesthetics. However, when sedation is provided, the level of analgesia obtained during the initial 1 hour following the intervention, is believed to be the result of a combination of factors. These factors may include, but are not limited to: 1. The effectiveness of the local anesthetics used. 2. The effects of the analgesic(s) and/or anxiolytic(s) used. 3. The degree of discomfort experienced by the patient at the time of the procedure. 4. The patients ability and reliability in recalling and recording the events. 5. The presence and influence of possible secondary gains and/or psychosocial factors. Reported result: Relief experienced during the 1st hour after the procedure: 100 % (Ultra-Short Term Relief)            Interpretative annotation: Clinically appropriate result. Analgesia during this period is likely to be Local Anesthetic and/or IV Sedative (Analgesic/Anxiolytic) related.  Effects of local anesthetic: The analgesic effects attained during this period are directly associated to the localized infiltration of local anesthetics and therefore cary significant diagnostic value as to the etiological  location, or anatomical origin, of the pain. Expected duration of relief is directly dependent on the pharmacodynamics of the local anesthetic used. Long-acting (4-6 hours) anesthetics used.  Reported result: Relief during the next 4 to 6 hour after the procedure: 100 % (Short-Term Relief)            Interpretative annotation: Clinically appropriate result. Analgesia during this period is likely to be Local Anesthetic-related.          Long-term benefit: Defined as the period of time past the expected duration of local anesthetics (1 hour for short-acting and 4-6 hours for long-acting). With the possible exception of prolonged sympathetic blockade from the local anesthetics, benefits during this period are typically attributed to, or associated with, other factors such as analgesic sensory neuropraxia, antiinflammatory effects, or beneficial biochemical changes provided by agents other than the local anesthetics.  Reported result: Extended relief following procedure: 70% Long-Term Relief)            Interpretative annotation: Clinically appropriate result. Good relief. No long-term benefit expected. Inflammation plays a part in the etiology to the pain.          Current benefits: Defined as reported results that persistent at this point in time.   Analgesia: 25-50 %            Function: Back to baseline ROM: Back to baseline Interpretative annotation: Short-term benefit. Therapeutic benefit observed. Effective diagnostic intervention.          Interpretation: Results would suggest a successful diagnostic intervention. We'll proceed with diagnostic intervention #2, as soon as convenient          Plan:  Please see "Plan of Care" for details.        Laboratory Chemistry  Inflammation Markers (CRP: Acute Phase) (ESR: Chronic Phase) No results found for: CRP, ESRSEDRATE, LATICACIDVEN               Rheumatology Markers No results found for: RF, ANA, Therisa Doy[place], [facility]               Renal Function Markers Lab Results  Component Value Date   BUN 15 01/04/2017   CREATININE 0.71 01/04/2017   GFRAA >60 01/04/2017   GFRNONAA >60 01/04/2017                 Hepatic Function Markers Lab Results  Component Value Date   AST 19 01/04/2017   ALT 13 (L) 01/04/2017   ALBUMIN 4.4 01/04/2017   ALKPHOS 69 01/04/2017   LIPASE 26 01/04/2017                 Electrolytes Lab Results  Component Value Date   NA 138 01/04/2017   K 4.2 01/04/2017   CL 102 01/04/2017   CALCIUM 9.4 01/04/2017                 Neuropathy Markers Lab Results  Component Value Date   VITAMINB12 840 12/31/2016   HGBA1C 5.6 12/31/2016   HIV NONREACTIVE 12/31/2016                 Bone Pathology Markers Lab Results  Component Value Date   VD25OH 56 12/31/2016                 Coagulation Parameters Lab Results  Component Value Date   INR 0.89 09/09/2016   LABPROT 12.0 09/09/2016   APTT 27 09/09/2016   PLT 328 01/04/2017                 Cardiovascular Markers Lab Results  Component Value Date   HGB 14.4 01/04/2017   HCT 43.6 01/04/2017                 CA Markers No results found for: CEA, CA125, LABCA2               Note: Lab results reviewed.  Recent Diagnostic Imaging Results  DG C-Arm 1-60 Min-No Report Fluoroscopy was utilized by the requesting physician.  No radiographic  interpretation.   Complexity Note: Imaging results reviewed. Results shared with Kelly [name], using Layman's terms.                         Meds   Current Outpatient Medications:  .  cloNIDine (CATAPRES) 0.1 MG tablet, TAKE 1 TABLET (0.1 MG TOTAL) BY MOUTH AT BEDTIME AS NEEDED., Disp: 90 tablet, Rfl: 1 .  diclofenac (VOLTAREN) 75 MG EC tablet, Take 1 tablet (75 mg total) by mouth 2 (two) times daily., Disp: 60 tablet, Rfl: 0 .  Dulaglutide (TRULICITY) 1.5 FV/4.9SW SOPN, Inject 1.5 mg into the skin once a week., Disp: 4 pen, Rfl: 2 .  EPINEPHrine 0.3 mg/0.3 mL IJ SOAJ injection, Inject into  the muscle., Disp: , Rfl:  .  hydrOXYzine (ATARAX/VISTARIL) 25 MG tablet, Take 1 tablet (25 mg total) by mouth 2 (two) times daily as needed for anxiety., Disp: 60 tablet, Rfl: 0 .  loratadine (CLARITIN) 10 MG tablet, Take 1 tablet (10 mg total) by mouth daily., Disp: 90 tablet, Rfl: 5 .  omeprazole (PRILOSEC) 40 MG capsule, Take 1 capsule (40 mg total) by mouth every morning., Disp: 90 capsule, Rfl: 5 .  pramipexole (MIRAPEX) 1.5 MG tablet, TAKE 1 TABLET BY MOUTH EVERY EVENING FOR RLS, Disp: 90 tablet, Rfl: 1 .  ranitidine (ZANTAC) 150 MG tablet, Take 1 tablet (150 mg total) by mouth at bedtime., Disp: 90 tablet, Rfl: 5 .  tiZANidine (ZANAFLEX) 4 MG tablet, Take 1 tablet (4 mg total) by mouth 2 (two) times daily as needed for muscle spasms., Disp: 60 tablet, Rfl: 1 .  topiramate (TOPAMAX) 50 MG tablet, TAKE 1 TABLET BY MOUTH TWICE DAILY, Disp: 180 tablet, Rfl: 0 .  traZODone (DESYREL) 100 MG tablet, Take 2 tablets (200 mg total) by mouth at bedtime as needed for sleep. (Patient taking differently: Take 150 mg by mouth at bedtime as needed for sleep. ), Disp: 60 tablet, Rfl: 2 .  triamcinolone (NASACORT ALLERGY 24HR) 55 MCG/ACT AERO nasal inhaler, Place 2 sprays into the nose at bedtime., Disp: 1 Inhaler, Rfl: 5 .  venlafaxine XR (EFFEXOR-XR) 150 MG 24 hr capsule, Take 1 capsule (150 mg total) by mouth daily., Disp: 30 capsule, Rfl: 2  ROS  Constitutional: Denies any fever or chills Gastrointestinal: No reported hemesis, hematochezia, vomiting, or acute GI distress Musculoskeletal: Denies any acute onset joint swelling, redness, loss of ROM, or weakness Neurological: No reported episodes of acute onset apraxia, aphasia, dysarthria, agnosia, amnesia, paralysis, loss of coordination, or loss of consciousness  Allergies  Kelly [name] is allergic to penicillins; chlorhexidine gluconate; ciprofloxacin; clindamycin/lincomycin; erythromycin; keflex [cephalexin]; lyrica [pregabalin]; nitrofurantoin  monohyd macro; other; sulfa antibiotics; tetracyclines & related; adhesive [tape]; latex; and vancomycin.  [place]  Drug: Kelly [name]  reports that she does not use drugs. Alcohol:  reports that she does not drink alcohol. Tobacco:  reports that she quit smoking about 34 years ago. Her smoking use included cigarettes. She started smoking about 36 years ago. She has a 1.50 pack-year smoking history. she has never used smokeless tobacco. Medical:  has a past medical history of Allergy, Anxiety, Depression, GERD (gastroesophageal reflux disease), History of kidney stones, Insomnia, Intermittent low back pain, Migraines, Osteoarthritis, Recurrent UTI, Restless leg syndrome, Sciatica of right side, Symptomatic menopausal or female climacteric states, Vertigo, and Vitamin D deficiency. Surgical: Kelly [name]  has a past surgical history that includes Tonsillectomy and adenoidectomy; Urethra dilation; Knee surgery (Left, 05/2011); Lumbar laminectomy/decompression microdiscectomy (N/A, 09/16/2016); Back surgery; Diagnostic laparoscopy; Dilation and curettage of uterus; and Knee arthroscopy with medial menisectomy (Left, 01/14/2017). Family: family history includes Cancer in her paternal grandmother.  Constitutional Exam  General appearance: Well nourished, well developed, and well hydrated. In no apparent acute distress Vitals:   07/22/17 1246  BP: (!) 105/53  Pulse: 95  Resp: 18  Temp: 98.4 F (36.9 C)  TempSrc: Oral  SpO2: 99%  Weight: 244 lb (110.7 kg)  Height: '5\' 5"'  (1.651 m)   BMI Assessment: Estimated body mass index is 40.6 kg/m as calculated from the following:   Height as of this encounter: '5\' 5"'  (1.651 m).   Weight as of this encounter: 244 lb (110.7 kg).  BMI interpretation table: BMI level Category Range association with higher incidence of chronic pain  <18 kg/m2 Underweight   18.5-24.9 kg/m2 Ideal body weight   25-29.9 kg/m2 Overweight Increased incidence by 20%  30-34.9 kg/m2  Obese (Class I) Increased incidence by 68%  35-39.9 kg/m2 Severe obesity (Class II) Increased incidence by 136%  >40 kg/m2 Extreme obesity (Class III) Increased incidence by 254%   BMI Readings from Last 4 Encounters:  07/22/17 40.60 kg/m  06/25/17 40.90 kg/m  06/21/17 40.60 kg/m  06/17/17 39.94 kg/m   Wt Readings from Last 4 Encounters:  07/22/17 244 lb (110.7 kg)  06/25/17 245 lb 12.8 oz (111.5 kg)  06/21/17 244 lb (110.7 kg)  06/17/17 240 lb (108.9 kg)  Psych/Mental status: Alert, oriented x 3 (person, place, & time)       Eyes: PERLA Respiratory: No evidence of acute respiratory distress  Cervical Spine Area Exam  Skin & Axial Inspection: No masses, redness, edema, swelling, or associated skin lesions Alignment: Symmetrical Functional ROM: Unrestricted ROM      Stability: No instability detected Muscle Tone/Strength: Functionally intact. No obvious neuro-muscular anomalies detected. Sensory (Neurological): Unimpaired Palpation: No palpable anomalies              Upper Extremity (UE) Exam    Side: Right upper extremity  Side: Left upper extremity  Skin & Extremity Inspection: Skin color, temperature, and hair growth are WNL. No peripheral edema or cyanosis. No masses, redness, swelling, asymmetry, or associated skin lesions. No contractures.  Skin & Extremity Inspection: Skin color, temperature, and hair growth are WNL. No peripheral edema or cyanosis. No masses, redness, swelling, asymmetry, or associated skin lesions. No contractures.  Functional ROM: Unrestricted ROM          Functional ROM: Unrestricted ROM          Muscle Tone/Strength: Functionally intact. No obvious neuro-muscular anomalies detected.  Muscle Tone/Strength: Functionally intact. No obvious neuro-muscular anomalies detected.  Sensory (Neurological): Unimpaired          Sensory (Neurological): Unimpaired  Palpation: No palpable anomalies              Palpation: No palpable anomalies               Specialized Test(s): Deferred         Specialized Test(s): Deferred          Thoracic Spine Area Exam  Skin & Axial Inspection: No masses, redness, or swelling Alignment: Symmetrical Functional ROM: Unrestricted ROM Stability: No instability detected Muscle Tone/Strength: Functionally intact. No obvious neuro-muscular anomalies detected. Sensory (Neurological): Unimpaired Muscle strength & Tone: No palpable anomalies  Lumbar Spine Area Exam  Skin & Axial Inspection: Well healed scar from previous spine surgery detected Alignment: Symmetrical Functional ROM: Unrestricted ROM      Stability: No instability detected Muscle Tone/Strength: Functionally intact. No obvious neuro-muscular anomalies detected. Sensory (Neurological): Unimpaired Palpation: No palpable anomalies       Provocative Tests: Lumbar Hyperextension and rotation test: Improved after treatment however back to pretreatment levels       Lumbar Lateral bending test: evaluation deferred today       Patrick's Maneuver: evaluation deferred today                    Gait & Posture Assessment  Ambulation: Unassisted Gait: Relatively normal for age and body habitus Posture: WNL   Lower Extremity Exam    Side: Right lower extremity  Side: Left lower extremity  Skin & Extremity Inspection: Skin color, temperature, and hair growth are WNL. No peripheral edema or cyanosis. No masses, redness, swelling, asymmetry, or associated skin lesions. No contractures.  Skin & Extremity Inspection: Skin color, temperature, and hair growth are WNL. No peripheral edema or cyanosis. No masses, redness, swelling, asymmetry, or associated skin lesions. No contractures.  Functional ROM: Unrestricted ROM          Functional ROM: Unrestricted ROM          Muscle Tone/Strength: Functionally intact. No obvious neuro-muscular anomalies detected.  Muscle Tone/Strength: Functionally intact. No obvious neuro-muscular anomalies detected.  Sensory  (Neurological): Unimpaired  Sensory (Neurological): Unimpaired  Palpation: No palpable anomalies  Palpation: No palpable anomalies   Assessment  Primary Diagnosis & Pertinent Problem List: The primary encounter diagnosis was Lumbar spondylosis. Diagnoses of Spondylolisthesis of lumbosacral region, Right lumbar radiculitis, and Lumbar degenerative disc disease were also pertinent to this visit.  Status Diagnosis  Responding Responding Responding 1. Lumbar spondylosis   2. Spondylolisthesis of lumbosacral region   3. Right lumbar radiculitis   4. Lumbar degenerative disc disease      52 year old female (initial referral from Dr. Cari Caraway with neurosurgery) with axial low back and right leg pain.  She states that her pain is approximately 95% back and 5% leg and of the leg involvement right is greater than left.  Patient also has major depressive disorder for which she sees psychiatry.  Of note patient had a fall in May 2018 which resulted in injury to her meniscus.  In June she went off to have left knee arthroscopy.  She continues to have a left knee brace in place.  She states that her knee pain is different from her chronic low back pain that radiates to the back of her thighs but rarely beyond that.  Patient status post L5-S1 microdiscectomy and decompression in March 2018.  This was done after she failed conservative treatment with a transforaminal epidural steroid injections and physical therapy.  Prior to her surgery, the patient was very active  and would walk 1-2 miles a day for her occupation.  Patient returns today for follow-up status post bilateral L2/3, L3/4 facet medial branch nerve blocks with steroid which was performed on 06/21/2017.  Patient states that for the first 2 weeks, she endorsed significant benefit from the facet medial branch nerve blocks.  She was able to tolerate standing up erect and performing activities of daily living with greater ease.  Furthermore she drives 1-2  hours daily and was able to tolerate sitting in a car for that period of time.  This previously caused her significant discomfort.  Patient is still participating in physical therapy exercises at home and is doing stretching which I commended her on.  In addition to pain relief, the patient endorses improved range of motion and ability to ambulate 1-2 weeks after our lumbar facet blocks.  At this point, we will repeat L2/3, L3/4 facet medial branch nerve blocks with steroid.  We also discussed lumbar radiofrequency ablation at these levels if the next block failed to provide her with extended pain benefit.  Risks and benefits were discussed.  Questions were answered.  Patient in agreement with plan.  Plan: -Repeat L2-L4 facet blocks with steroid.  Consider radiofrequency ablation at these levels in the future. -Consideration of spinal cord stimulator trial if current interventional therapies are not helpful for her pain.  (primary complaint is axial low back pain rather than appendicular pain)   Lab-work, procedure(s), and/or referral(s): Orders Placed This Encounter  Procedures  . LUMBAR FACET(MEDIAL BRANCH NERVE BLOCK) [facility]    Provider-requested follow-up: Return for Procedure. Time Note: Greater than 50% of the 25 minute(s) of face-to-face time spent with Kelly [name], was spent in counseling/coordination of care regarding: the treatment plan, treatment alternatives, the risks and possible complications of proposed treatment, the results, interpretation and significance of  her recent diagnostic interventional treatment(s) and realistic expectations.  Future Appointments  Date Time Provider [place]  07/26/2017  1:40 PM Steele Sizer, MD [place] [facility]  09/01/2017  2:30 PM Elvin So, MD [facility] None    Primary Care Physician: Steele Sizer, MD Location: [facility] Note by: Gillis Santa, M.D Date: 07/22/2017; Time: 1:12 PM  There are no  Patient Instructions on file for this visit.

## 2017-07-22 NOTE — Progress Notes (Signed)
Safety precautions to be maintained throughout the outpatient stay will include: orient to surroundings, keep bed in low position, maintain call bell within reach at all times, provide assistance with transfer out of bed and ambulation.  

## 2017-07-26 ENCOUNTER — Encounter: Payer: Self-pay | Admitting: Family Medicine

## 2017-07-26 ENCOUNTER — Ambulatory Visit (INDEPENDENT_AMBULATORY_CARE_PROVIDER_SITE_OTHER): Payer: 59 | Admitting: Family Medicine

## 2017-07-26 VITALS — BP 110/60 | HR 120 | Resp 14 | Ht 65.0 in | Wt 231.6 lb

## 2017-07-26 DIAGNOSIS — F331 Major depressive disorder, recurrent, moderate: Secondary | ICD-10-CM | POA: Diagnosis not present

## 2017-07-26 DIAGNOSIS — Z741 Need for assistance with personal care: Secondary | ICD-10-CM | POA: Diagnosis not present

## 2017-07-26 DIAGNOSIS — E8881 Metabolic syndrome: Secondary | ICD-10-CM | POA: Diagnosis not present

## 2017-07-26 DIAGNOSIS — G43009 Migraine without aura, not intractable, without status migrainosus: Secondary | ICD-10-CM

## 2017-07-26 NOTE — Addendum Note (Signed)
Addended by: Vonna Kotyk L on: 07/26/2017 05:00 PM   Modules accepted: Orders

## 2017-07-26 NOTE — Progress Notes (Addendum)
Name: Kelly [name]   MRN: [id number]    DOB: 1966/06/15   Date:07/26/2017       Progress Note  Subjective  Chief Complaint  Chief Complaint  Patient presents with  . Headache  . Migraine    HPI  Major Depression: she is seeing Dr. Einar Grad and she was doing better, but still under a lot of stress, waiting to start a new job, husband unemployed and they are struggling financially. She had to admit him to psychiatric unit this past weekend.   Migraine Headaches: She started taking Topamax since Dec and episodes of headaches has decreased only two since last visit, she is only taking 50 mg at night and seems to be working. Discussed with her on how to adjust dose. She described and temporal area pressure, occasionally associated with vertigo and nausea, but not recently.   Morbid obesity: she has been gradually gained weight over the years, today' is her highest weight at 245.8lbs. She is eating healthier, but not very active because of her pain. She was started on Trulicity 78/2956 and lost 15 lbs since. Shot are scheduled for Friday's and has diarrhea 14-48 hours later, also feels nauseated , at times mild lower abdominal cramping. Advised to make sure she eats every 2 hours on Saturdays and Sundays to help control symptoms. She wants to continue medication at this time.    Patient Active Problem List   Diagnosis Date Noted  . Morbid obesity ([place]) 07/26/2017  . Spondylolisthesis of lumbosacral region 02/11/2016  . Hematuria 04/09/2015  . Hyperglycemia 04/06/2015  . Right lumbar radiculitis 12/28/2014  . Moderate recurrent major depression ([place]) 12/27/2014  . Gastro-esophageal reflux disease without esophagitis 12/27/2014  . Bulge of lumbar disc without myelopathy 12/27/2014  . Dysmetabolic syndrome 21/30/8657  . Migraine without aura and responsive to treatment 12/27/2014  . Osteoarthrosis 12/27/2014  . Obesity (BMI 30-39.9) 12/27/2014  . Restless leg 12/27/2014  . Allergic  rhinitis, seasonal 12/27/2014    Past Surgical History:  Procedure Laterality Date  . BACK SURGERY    . DIAGNOSTIC LAPAROSCOPY    . DILATION AND CURETTAGE OF UTERUS    . KNEE ARTHROSCOPY WITH MEDIAL MENISECTOMY Left 01/14/2017   Procedure: KNEE ARTHROSCOPY WITH MEDIAL AND LATERAL MENISECTOMY;  Surgeon: Hessie Knows, MD;  Location: [facility] ORS;  Service: Orthopedics;  Laterality: Left;  Partial Knee menisectomy  . KNEE SURGERY Left 05/2011   Dr. Rudene Christians- Arthroscopic  . LUMBAR LAMINECTOMY/DECOMPRESSION MICRODISCECTOMY N/A 09/16/2016   Procedure: LUMBAR LAMINECTOMY/DECOMPRESSION MICRODISCECTOMY 1 LEVEL L5-S1;  Surgeon: Meade Maw, MD;  Location: [facility] ORS;  Service: Neurosurgery;  Laterality: N/A;  . TONSILLECTOMY AND ADENOIDECTOMY    . URETHRAL STRICTURE DILATATION      Family History  Problem Relation Age of Onset  . Cancer Paternal Grandmother     Social History   Socioeconomic History  . Marital status: Married    Spouse name: Jenny Reichmann  . Number of children: 2  . Years of education: Not on file  . Highest education level: Associate degree: occupational, Hotel manager, or vocational program  Social Needs  . Financial resource strain: Very hard  . Food insecurity - worry: Often true  . Food insecurity - inability: Often true  . Transportation needs - medical: No  . Transportation needs - non-medical: Yes  Occupational History  . Occupation: not employed  Tobacco Use  . Smoking status: Former Smoker    Packs/day: 0.75    Years: 2.00    Pack years: 1.50  Types: Cigarettes    Start date: 07/06/1981    Last attempt to quit: 07/07/1983    Years since quitting: 34.0  . Smokeless tobacco: Never Used  Substance and Sexual Activity  . Alcohol use: No    Alcohol/week: 0.0 oz  . Drug use: No  . Sexual activity: Yes    Partners: Male  Other Topics Concern  . Not on file  Social History Narrative   Struggling.    Married and husband has a lot of medical problems, employed but not  getting paid - he was on short term disability but is pending to get on long term disability      Current Outpatient Medications:  .  cloNIDine (CATAPRES) 0.1 MG tablet, TAKE 1 TABLET (0.1 MG TOTAL) BY MOUTH AT BEDTIME AS NEEDED., Disp: 90 tablet, Rfl: 1 .  diclofenac (VOLTAREN) 75 MG EC tablet, Take 1 tablet (75 mg total) by mouth 2 (two) times daily., Disp: 60 tablet, Rfl: 0 .  Dulaglutide (TRULICITY) 1.5 RD/4.0CX SOPN, Inject 1.5 mg into the skin once a week., Disp: 4 pen, Rfl: 2 .  EPINEPHrine 0.3 mg/0.3 mL IJ SOAJ injection, Inject into the muscle., Disp: , Rfl:  .  hydrOXYzine (ATARAX/VISTARIL) 25 MG tablet, Take 1 tablet (25 mg total) by mouth 2 (two) times daily as needed for anxiety., Disp: 60 tablet, Rfl: 0 .  loratadine (CLARITIN) 10 MG tablet, Take 1 tablet (10 mg total) by mouth daily., Disp: 90 tablet, Rfl: 5 .  omeprazole (PRILOSEC) 40 MG capsule, Take 1 capsule (40 mg total) by mouth every morning., Disp: 90 capsule, Rfl: 5 .  pramipexole (MIRAPEX) 1.5 MG tablet, TAKE 1 TABLET BY MOUTH EVERY EVENING FOR RLS, Disp: 90 tablet, Rfl: 1 .  ranitidine (ZANTAC) 150 MG tablet, Take 1 tablet (150 mg total) by mouth at bedtime., Disp: 90 tablet, Rfl: 5 .  tiZANidine (ZANAFLEX) 4 MG tablet, Take 1 tablet (4 mg total) by mouth 2 (two) times daily as needed for muscle spasms., Disp: 60 tablet, Rfl: 1 .  topiramate (TOPAMAX) 50 MG tablet, TAKE 1 TABLET BY MOUTH TWICE DAILY, Disp: 180 tablet, Rfl: 0 .  traZODone (DESYREL) 100 MG tablet, Take 2 tablets (200 mg total) by mouth at bedtime as needed for sleep. (Patient taking differently: Take 150 mg by mouth at bedtime as needed for sleep. ), Disp: 60 tablet, Rfl: 2 .  triamcinolone (NASACORT ALLERGY 24HR) 55 MCG/ACT AERO nasal inhaler, Place 2 sprays into the nose at bedtime., Disp: 1 Inhaler, Rfl: 5 .  venlafaxine XR (EFFEXOR-XR) 150 MG 24 hr capsule, Take 1 capsule (150 mg total) by mouth daily., Disp: 30 capsule, Rfl: 2  Allergies  Allergen  Reactions  . Penicillins Anaphylaxis, Hives, Nausea And Vomiting and Swelling    Has patient had a PCN reaction causing immediate rash, facial/tongue/throat swelling, SOB or lightheadedness with hypotension: Yes Has patient had a PCN reaction causing severe rash involving mucus membranes or skin necrosis: Yes Has patient had a PCN reaction that required hospitalization No Has patient had a PCN reaction occurring within the last 10 years: Yes If all of the above answers are "NO", then may proceed with Cephalosporin use.   . Chlorhexidine Gluconate Itching and Rash  . Ciprofloxacin Hives  . Clindamycin/Lincomycin Hives  . Erythromycin Hives and Nausea And Vomiting  . Keflex [Cephalexin] Hives  . Lyrica [Pregabalin]     Dizziness, syncope  . Nitrofurantoin Monohyd Macro Hives and Nausea And Vomiting  . Other Hives  .  Sulfa Antibiotics Hives and Nausea And Vomiting    Rapid heart rate  . Tetracyclines & Related Hives  . Adhesive [Tape] Rash  . Latex Rash  . Vancomycin Itching and Rash     ROS  Constitutional: Negative for fever , positive for  weight change.  Respiratory: Negative for cough and shortness of breath.   Cardiovascular: Negative for chest pain or palpitations.  Gastrointestinal: Negative for abdominal pain, no bowel changes.  Musculoskeletal: Negative for gait problem or joint swelling.  Skin: Negative for rash.  Neurological: Negative for dizziness , positive for  headache.  No other specific complaints in a complete review of systems (except as listed in HPI above).  Objective  Vitals:   07/26/17 1344  BP: 110/60  Pulse: (!) 120  Resp: 14  SpO2: 98%  Weight: 231 lb 9.6 oz (105.1 kg)  Height: 5\' 5"  (1.651 m)    Body mass index is 38.54 kg/m.  Physical Exam  Constitutional: Patient appears well-developed and well-nourished. Obese  No distress.  HEENT: head atraumatic, normocephalic, pupils equal and reactive to light,  neck supple, throat within normal  limits Cardiovascular: Normal rate, regular rhythm and normal heart sounds.  No murmur heard. No BLE edema. Pulmonary/Chest: Effort normal and breath sounds normal. No respiratory distress. Abdominal: Soft.  There is no tenderness. Psychiatric: Patient has a depressed mood,  behavior is normal. Judgment and thought content normal. Neurological : no focal findings.   PHQ2/9: Depression screen [facility] 2/9 07/22/2017 06/25/2017 06/21/2017 06/17/2017 10/19/2016  Decreased Interest 0 1 0 0 2  Down, Depressed, Hopeless 0 1 0 0 3  PHQ - 2 Score 0 2 0 0 5  Altered sleeping - 2 - - 3  Tired, decreased energy - 2 - - 3  Change in appetite - 3 - - 3  Feeling bad or failure about yourself  - 1 - - 2  Trouble concentrating - 0 - - 2  Moving slowly or fidgety/restless - 2 - - 3  Suicidal thoughts - 0 - - 0  PHQ-9 Score - 12 - - 21  Difficult doing work/chores - Somewhat difficult - - Very difficult     Fall Risk: Fall Risk  07/22/2017 06/25/2017 06/21/2017 06/17/2017 10/19/2016  Falls in the past year? No Yes No No No  Number falls in past yr: - 1 - - -  Comment - - - - -  Injury with Fall? - Yes - - -  Comment - - - - -  Follow up - - - - -     Functional Status Survey: Is the patient deaf or have difficulty hearing?: No Does the patient have difficulty seeing, even when wearing glasses/contacts?: No Does the patient have difficulty concentrating, remembering, or making decisions?: No Does the patient have difficulty walking or climbing stairs?: No Does the patient have difficulty dressing or bathing?: No Does the patient have difficulty doing errands alone such as visiting a doctor's office or shopping?: No    Assessment & Plan  1. Dysmetabolic syndrome  Doing well on Trulicity   2. Morbid obesity ([place])  Lost 15 lbs on Trulicity, she states it curbs her appetite, eating small portions and drinking more water  3. Moderate recurrent major depression ([place])  Still seeing  psychiatrist, husband admitted to psychiatric unit two days ago.   4. Migraine without aura and responsive to treatment  She had a couple of episodes since last visit , taking Topamax only 50 mg at night  and seems to be doing well, explained to her she may go up to 200 mg at night. Increasing by 50 mg every three days.   5. Unable to budget finances  - Ambulatory referral to [facility]

## 2017-07-27 NOTE — Addendum Note (Signed)
Addended by: Steele Sizer F on: 07/27/2017 09:18 AM   Modules accepted: Orders

## 2017-08-03 ENCOUNTER — Telehealth: Payer: Self-pay | Admitting: Family Medicine

## 2017-08-03 NOTE — Telephone Encounter (Unsigned)
Copied from [place] [id number]. Topic: General - Other >> Aug 03, 2017  4:24 PM Neva Seat wrote: Trulicity Shot - pt hasn't taken it because she feels shaky and nauseous.  Pt has stopped taking it on Fri. and feeling better, better with acid reflux.  Pt tried the peanut butter, orange juice and it didn't work.  Needing to find another Rx.  Please call to discuss.Marland KitchenMarland Kitchen

## 2017-08-03 NOTE — Telephone Encounter (Signed)
Pt stating she was given Trulicity to help loose weight but since started taking the medication she has experienced nausea "like morning sickness". Pt states she tried to take the shot for the past 3 weeks and did not take the dose due on last Friday due to the symptoms.Pt states she also tried to take peanut butter and orange juice as suggested by Dr. Ancil Boozer but still experienced nausea and a shaky feeling.  Pt states she does feel better after stopping the medication. Pt states she was starting to experience trouble concentrating and voiced concern with driving back and forth to work while on the medication. Pt wants to make Dr. Ancil Boozer aware that she does not want to continue to take Trulicity due to the side effects.

## 2017-08-04 ENCOUNTER — Encounter: Payer: Self-pay | Admitting: Student in an Organized Health Care Education/Training Program

## 2017-08-04 ENCOUNTER — Ambulatory Visit
Admission: RE | Admit: 2017-08-04 | Discharge: 2017-08-04 | Disposition: A | Discharge status: Home / Self Care | Payer: 59 | Source: Ambulatory Visit | Attending: Student in an Organized Health Care Education/Training Program | Admitting: Student in an Organized Health Care Education/Training Program

## 2017-08-04 ENCOUNTER — Other Ambulatory Visit: Payer: Self-pay

## 2017-08-04 ENCOUNTER — Ambulatory Visit (HOSPITAL_BASED_OUTPATIENT_CLINIC_OR_DEPARTMENT_OTHER): Payer: 59 | Admitting: Student in an Organized Health Care Education/Training Program

## 2017-08-04 VITALS — BP 125/79 | HR 85 | Temp 98.1°F | Resp 13 | Ht 65.0 in | Wt 231.0 lb

## 2017-08-04 DIAGNOSIS — Z882 Allergy status to sulfonamides status: Secondary | ICD-10-CM | POA: Insufficient documentation

## 2017-08-04 DIAGNOSIS — Z880 Allergy status to penicillin: Secondary | ICD-10-CM | POA: Diagnosis not present

## 2017-08-04 DIAGNOSIS — Z888 Allergy status to other drugs, medicaments and biological substances status: Secondary | ICD-10-CM | POA: Diagnosis not present

## 2017-08-04 DIAGNOSIS — M25551 Pain in right hip: Secondary | ICD-10-CM | POA: Diagnosis not present

## 2017-08-04 DIAGNOSIS — M545 Low back pain: Secondary | ICD-10-CM | POA: Diagnosis not present

## 2017-08-04 DIAGNOSIS — F419 Anxiety disorder, unspecified: Secondary | ICD-10-CM | POA: Diagnosis not present

## 2017-08-04 DIAGNOSIS — Z881 Allergy status to other antibiotic agents status: Secondary | ICD-10-CM | POA: Diagnosis not present

## 2017-08-04 DIAGNOSIS — M4317 Spondylolisthesis, lumbosacral region: Secondary | ICD-10-CM | POA: Diagnosis not present

## 2017-08-04 DIAGNOSIS — M47816 Spondylosis without myelopathy or radiculopathy, lumbar region: Secondary | ICD-10-CM

## 2017-08-04 MED ORDER — LIDOCAINE HCL (PF) 1 % IJ SOLN
10.0000 mL | Freq: Once | INTRAMUSCULAR | Status: AC
  Administered 2017-08-04: 10 mL
  Filled 2017-08-04: qty 10

## 2017-08-04 MED ORDER — ROPIVACAINE HCL 2 MG/ML IJ SOLN
10.0000 mL | Freq: Once | INTRAMUSCULAR | Status: AC
  Administered 2017-08-04: 10 mL
  Filled 2017-08-04: qty 10

## 2017-08-04 MED ORDER — DEXAMETHASONE SODIUM PHOSPHATE 10 MG/ML IJ SOLN
10.0000 mg | Freq: Once | INTRAMUSCULAR | Status: AC
  Administered 2017-08-04: 10 mg
  Filled 2017-08-04: qty 1

## 2017-08-04 MED ORDER — FENTANYL CITRATE (PF) 100 MCG/2ML IJ SOLN
25.0000 ug | INTRAMUSCULAR | Status: DC | PRN
  Administered 2017-08-04: 75 ug via INTRAVENOUS
  Filled 2017-08-04: qty 2

## 2017-08-04 NOTE — Progress Notes (Signed)
Patient's Name: Kelly [name]  MRN: [id number]  Referring Provider: Gillis Santa, MD  DOB: Dec 07, 1965  PCP: Steele Sizer, MD  DOS: 08/04/2017  Note by: Gillis Santa, MD  Service setting: Ambulatory outpatient  Specialty: Interventional Pain Management  Patient type: Established  Location: [facility] (AMB) Pain Management Facility  Visit type: Interventional Procedure   Primary Reason for Visit: Interventional Pain Management Treatment. CC: Hip Pain (right)  Procedure:  Anesthesia, Analgesia, Anxiolysis:  Type: Diagnostic Medial Branch Facet Block with steroid #2 Region: Lumbar Level: L2/3, L3/4, L4/5 Laterality: Bilateral  Type: Local Anesthesia with Moderate (Conscious) Sedation Local Anesthetic: Lidocaine 1% Route: Intravenous (IV) IV Access: Secured Sedation: Meaningful verbal contact was maintained at all times during the procedure  Indication(s): Analgesia and Anxiety   Indications: 1. Lumbar spondylosis   2. Spondylolisthesis of lumbosacral region    Pain Score: Pre-procedure: 2 /10 Post-procedure: 0-No pain/70  52 year old female who presents with axial low back pain that radiates to hips  (R>L) status post L2/3, L3/4, and L4/5 facet block bilaterally on June 21, 2017 which provided her with greater than 70% improvement in her axial low back pain symptoms for 3-5 days suggesting a positive diagnostic block.  Patient endorsed improvement in range of motion, ability to perform activities of daily living, overall pain.  She is here for diagnostic blocks #2.   Pre-op Assessment:  Kelly [name] is a 52 y.o. (year old), female patient, seen today for interventional treatment. She  has a past surgical history that includes Tonsillectomy and adenoidectomy; Urethra dilation; Knee surgery (Left, 05/2011); Lumbar laminectomy/decompression microdiscectomy (N/A, 09/16/2016); Back surgery; Diagnostic laparoscopy; Dilation and curettage of uterus; and Knee arthroscopy with medial  menisectomy (Left, 01/14/2017). Kelly [name] has a current medication list which includes the following prescription(s): clonidine, diclofenac, epinephrine, hydroxyzine, loratadine, omeprazole, pramipexole, ranitidine, tizanidine, topiramate, trazodone, triamcinolone, venlafaxine xr, and dulaglutide, and the following Facility-Administered Medications: fentanyl. Her primarily concern today is the Hip Pain (right)  Initial Vital Signs: There were no vitals taken for this visit. BMI: Estimated body mass index is 38.44 kg/m as calculated from the following:   Height as of this encounter: 5\' 5"  (1.651 m).   Weight as of this encounter: 231 lb (104.8 kg).  Risk Assessment: Allergies: Reviewed. She is allergic to penicillins; chlorhexidine gluconate; ciprofloxacin; clindamycin/lincomycin; erythromycin; keflex [cephalexin]; lyrica [pregabalin]; nitrofurantoin monohyd macro; other; sulfa antibiotics; tetracyclines & related; adhesive [tape]; latex; and vancomycin.  Allergy Precautions: None required Coagulopathies: Reviewed. None identified.  Blood-thinner therapy: None at this time Active Infection(s): Reviewed. None identified. Kelly [name] is afebrile  Site Confirmation: Kelly [name] was asked to confirm the procedure and laterality before marking the site Procedure checklist: Completed Consent: Before the procedure and under the influence of no sedative(s), amnesic(s), or anxiolytics, the patient was informed of the treatment options, risks and possible complications. To fulfill our ethical and legal obligations, as recommended by the American Medical Association's Code of Ethics, I have informed the patient of my clinical impression; the nature and purpose of the treatment or procedure; the risks, benefits, and possible complications of the intervention; the alternatives, including doing nothing; the risk(s) and benefit(s) of the alternative treatment(s) or procedure(s); and the risk(s) and benefit(s)  of doing nothing. The patient was provided information about the general risks and possible complications associated with the procedure. These may include, but are not limited to: failure to achieve desired goals, infection, bleeding, organ or nerve damage, allergic reactions, paralysis, and death. In addition, the patient was informed  of those risks and complications associated to Spine-related procedures, such as failure to decrease pain; infection (i.e.: Meningitis, epidural or intraspinal abscess); bleeding (i.e.: epidural hematoma, subarachnoid hemorrhage, or any other type of intraspinal or peri-dural bleeding); organ or nerve damage (i.e.: Any type of peripheral nerve, nerve root, or spinal cord injury) with subsequent damage to sensory, motor, and/or autonomic systems, resulting in permanent pain, numbness, and/or weakness of one or several areas of the body; allergic reactions; (i.e.: anaphylactic reaction); and/or death. Furthermore, the patient was informed of those risks and complications associated with the medications. These include, but are not limited to: allergic reactions (i.e.: anaphylactic or anaphylactoid reaction(s)); adrenal axis suppression; blood sugar elevation that in diabetics may result in ketoacidosis or comma; water retention that in patients with history of congestive heart failure may result in shortness of breath, pulmonary edema, and decompensation with resultant heart failure; weight gain; swelling or edema; medication-induced neural toxicity; particulate matter embolism and blood vessel occlusion with resultant organ, and/or nervous system infarction; and/or aseptic necrosis of one or more joints. Finally, the patient was informed that Medicine is not an exact science; therefore, there is also the possibility of unforeseen or unpredictable risks and/or possible complications that may result in a catastrophic outcome. The patient indicated having understood very clearly. We  have given the patient no guarantees and we have made no promises. Enough time was given to the patient to ask questions, all of which were answered to the patient's satisfaction. Kelly [name] has indicated that she wanted to continue with the procedure. Attestation: I, the ordering provider, attest that I have discussed with the patient the benefits, risks, side-effects, alternatives, likelihood of achieving goals, and potential problems during recovery for the procedure that I have provided informed consent. Date: 08/04/2017; Time: 11:02 AM  Pre-Procedure Preparation:  Monitoring: As per clinic protocol. Respiration, ETCO2, SpO2, BP, heart rate and rhythm monitor placed and checked for adequate function Safety Precautions: Patient was assessed for positional comfort and pressure points before starting the procedure. Time-out: I initiated and conducted the "Time-out" before starting the procedure, as per protocol. The patient was asked to participate by confirming the accuracy of the "Time Out" information. Verification of the correct person, site, and procedure were performed and confirmed by me, the nursing staff, and the patient. "Time-out" conducted as per Joint Commission's Universal Protocol (UP.01.01.01). "Time-out" Date & Time: 08/04/2017; 0921 hrs.  Description of Procedure Process:   Position: Prone Target Area: For Lumbar Facet blocks, the target is the groove formed by the junction of the transverse process and superior articular process. Approach: Paramedial approach. Area Prepped: Entire Posterior Lumbosacral Region Prepping solution: ChloraPrep (2% chlorhexidine gluconate and 70% isopropyl alcohol) Safety Precautions: Aspiration looking for blood return was conducted prior to all injections. At no point did we inject any substances, as a needle was being advanced. No attempts were made at seeking any paresthesias. Safe injection practices and needle disposal techniques used. Medications  properly checked for expiration dates. SDV (single dose vial) medications used. Description of the Procedure: Protocol guidelines were followed. The patient was placed in position over the fluoroscopy table. The target area was identified and the area prepped in the usual manner. Skin desensitized using vapocoolant spray. Skin & deeper tissues infiltrated with local anesthetic. Appropriate amount of time allowed to pass for local anesthetics to take effect. The procedure needle was introduced through the skin, ipsilateral to the reported pain, and advanced to the target area. Employing the "Medial Branch Technique",  the needles were advanced to the angle made by the superior and medial portion of the transverse process, and the lateral and inferior portion of the superior articulating process of the targeted vertebral bodies. This area is known as "Burton's Eye" or the "Eye of the [place] Dog".  Negative aspiration confirmed. Solution injected in intermittent fashion, asking for systemic symptoms every 0.5cc of injectate. The needles were then removed and the area cleansed, making sure to leave some of the prepping solution back to take advantage of its long term bactericidal properties.   Illustration of the posterior view of the lumbar spine and the posterior neural structures. Laminae of L2 through S1 are labeled. DPRL5, dorsal primary ramus of L5; DPRS1, dorsal primary ramus of S1; DPR3, dorsal primary ramus of L3; FJ, facet (zygapophyseal) joint L3-L4; I, inferior articular process of L4; LB1, lateral branch of dorsal primary ramus of L1; IAB, inferior articular branches from L3 medial branch (supplies L4-L5 facet joint); IBP, intermediate branch plexus; MB3, medial branch of dorsal primary ramus of L3; NR3, third lumbar nerve root; S, superior articular process of L5; SAB, superior articular branches from L4 (supplies L4-5 facet joint also); TP3, transverse process of L3.  Vitals:   08/04/17 0935  08/04/17 0945 08/04/17 0955 08/04/17 1004  BP: 130/73 106/85 118/61 125/79  Pulse: 85     Resp: 12 14 15 13   Temp:      SpO2: 100% 99% 99% 100%  Weight:      Height:        Start Time: 0921 hrs. End Time: 0935 hrs. Materials:  Needle(s) Type: Regular needle Gauge: 22G Length: 3.5-in Medication(s): We administered fentaNYL, lidocaine (PF), ropivacaine (PF) 2 mg/mL (0.2%), and dexamethasone. Please see chart orders for dosing details. 6 cc solution made a 5 cc of 0.2% ropivacaine, 1 cc of Decadron (10 mg/cc).  1 cc injected at each level bilaterally.  Imaging Guidance (Spinal):  Type of Imaging Technique: Fluoroscopy Guidance (Spinal) Indication(s): Assistance in needle guidance and placement for procedures requiring needle placement in or near specific anatomical locations not easily accessible without such assistance. Exposure Time: Please see nurses notes. Contrast: None used. Fluoroscopic Guidance: I was personally present during the use of fluoroscopy. "Tunnel Vision Technique" used to obtain the best possible view of the target area. Parallax error corrected before commencing the procedure. "Direction-depth-direction" technique used to introduce the needle under continuous pulsed fluoroscopy. Once target was reached, antero-posterior, oblique, and lateral fluoroscopic projection used confirm needle placement in all planes. Images permanently stored in EMR. Interpretation: No contrast injected. I personally interpreted the imaging intraoperatively. Adequate needle placement confirmed in multiple planes. Permanent images saved into the patient's record.  Antibiotic Prophylaxis:  Indication(s): None identified Antibiotic given: None  Post-operative Assessment:  EBL: None Complications: No immediate post-treatment complications observed by team, or reported by patient. Note: The patient tolerated the entire procedure well. A repeat set of vitals were taken after the procedure and the  patient was kept under observation following institutional policy, for this type of procedure. Post-procedural neurological assessment was performed, showing return to baseline, prior to discharge. The patient was provided with post-procedure discharge instructions, including a section on how to identify potential problems. Should any problems arise concerning this procedure, the patient was given instructions to immediately contact us, at any time, without hesitation. In any case, we plan to contact the patient by telephone for a follow-up status report regarding this interventional procedure. Comments:  No additional relevant information. 5 out  of 5 strength bilateral lower extremity: Plantar flexion, dorsiflexion, knee flexion, knee extension.  Plan of Care    Imaging Orders     DG C-Arm 1-60 Min-No Report Procedure Orders    No procedure(s) ordered today    Medications ordered for procedure: Meds ordered this encounter  Medications  . fentaNYL (SUBLIMAZE) injection 25-100 mcg    Make sure Narcan is available in the pyxis when using this medication. In the event of respiratory depression (RR< 8/min): Titrate NARCAN (naloxone) in increments of 0.1 to 0.2 mg IV at 2-3 minute intervals, until desired degree of reversal.  . lidocaine (PF) (XYLOCAINE) 1 % injection 10 mL  . ropivacaine (PF) 2 mg/mL (0.2%) (NAROPIN) injection 10 mL  . dexamethasone (DECADRON) injection 10 mg   Medications administered: We administered fentaNYL, lidocaine (PF), ropivacaine (PF) 2 mg/mL (0.2%), and dexamethasone.  See the medical record for exact dosing, route, and time of administration.  New Prescriptions   No medications on file   Disposition: Discharge home  Discharge Date & Time: 08/04/2017; 1010 hrs.   Physician-requested Follow-up: Return in about 4 weeks (around 09/01/2017) for Post Procedure Evaluation. Future Appointments  Date Time Provider [address]  08/31/2017 10:00 AM Gillis Santa,  MD [facility] None  09/01/2017  2:30 PM Elvin So, MD [facility] None  10/21/2017 10:00 AM Steele Sizer, MD [facility]   Primary Care Physician: Steele Sizer, MD Location: [facility] Note by: Gillis Santa, MD Date: 08/04/2017; Time: 10:47 AM  Disclaimer:  Medicine is not an exact science. The only guarantee in medicine is that nothing is guaranteed. It is important to note that the decision to proceed with this intervention was based on the information collected from the patient. The Data and conclusions were drawn from the patient's questionnaire, the interview, and the physical examination. Because the information was provided in large part by the patient, it cannot be guaranteed that it has not been purposely or unconsciously manipulated. Every effort has been made to obtain as much relevant data as possible for this evaluation. It is important to note that the conclusions that lead to this procedure are derived in large part from the available data. Always take into account that the treatment will also be dependent on availability of resources and existing treatment guidelines, considered by other Pain Management Practitioners as being common knowledge and practice, at the time of the intervention. For Medico-Legal purposes, it is also important to point out that variation in procedural techniques and pharmacological choices are the acceptable norm. The indications, contraindications, technique, and results of the above procedure should only be interpreted and judged by a Board-Certified Interventional Pain Specialist with extensive familiarity and expertise in the same exact procedure and technique.

## 2017-08-04 NOTE — Patient Instructions (Signed)

## 2017-08-05 ENCOUNTER — Telehealth: Payer: Self-pay | Admitting: *Deleted

## 2017-08-05 NOTE — Telephone Encounter (Signed)
Okay, I will take off her active list.

## 2017-08-05 NOTE — Telephone Encounter (Signed)
No problems post procedure. 

## 2017-08-05 NOTE — Telephone Encounter (Signed)
Patient called. Patient aware. She likes the fact that it was working and would like to try another alternative. Please advise. Send alternative to [facility] on [address].

## 2017-08-05 NOTE — Telephone Encounter (Signed)
Patient called. Patient aware. She will try to start it back and eat small meals and see if that helps.

## 2017-08-05 NOTE — Telephone Encounter (Signed)
It is a class effect, usually subsides the longer the patient takes it, she needs to eat very small meals, but some patient cannot tolerate it. Unfortunately does not seem like she can tolerate it

## 2017-08-11 ENCOUNTER — Encounter: Admission: RE | Payer: Self-pay | Source: Ambulatory Visit

## 2017-08-11 ENCOUNTER — Ambulatory Visit: Admission: RE | Admit: 2017-08-11 | Payer: 59 | Source: Ambulatory Visit | Admitting: Internal Medicine

## 2017-08-11 SURGERY — COLONOSCOPY WITH PROPOFOL
Anesthesia: General

## 2017-08-17 ENCOUNTER — Other Ambulatory Visit: Payer: Self-pay | Admitting: Family Medicine

## 2017-08-17 NOTE — Telephone Encounter (Signed)
Is patient suppose to be on this medication-Mirapex?

## 2017-08-31 ENCOUNTER — Ambulatory Visit: Payer: Self-pay | Admitting: Student in an Organized Health Care Education/Training Program

## 2017-09-01 ENCOUNTER — Ambulatory Visit: Payer: 59 | Admitting: Psychiatry

## 2017-10-11 ENCOUNTER — Encounter: Payer: Self-pay | Admitting: Student in an Organized Health Care Education/Training Program

## 2017-10-11 ENCOUNTER — Encounter: Payer: Self-pay | Admitting: Family Medicine

## 2017-10-21 ENCOUNTER — Ambulatory Visit: Payer: Self-pay | Admitting: Family Medicine

## 2018-01-31 ENCOUNTER — Ambulatory Visit (INDEPENDENT_AMBULATORY_CARE_PROVIDER_SITE_OTHER): Payer: Self-pay | Admitting: Family Medicine

## 2018-01-31 ENCOUNTER — Encounter: Payer: Self-pay | Admitting: Family Medicine

## 2018-01-31 ENCOUNTER — Other Ambulatory Visit: Payer: Self-pay | Admitting: Family Medicine

## 2018-01-31 VITALS — BP 114/76 | HR 103 | Temp 97.8°F | Resp 16 | Ht 65.0 in | Wt 220.4 lb

## 2018-01-31 DIAGNOSIS — K219 Gastro-esophageal reflux disease without esophagitis: Secondary | ICD-10-CM

## 2018-01-31 DIAGNOSIS — Z1231 Encounter for screening mammogram for malignant neoplasm of breast: Secondary | ICD-10-CM

## 2018-01-31 DIAGNOSIS — G43009 Migraine without aura, not intractable, without status migrainosus: Secondary | ICD-10-CM

## 2018-01-31 DIAGNOSIS — F322 Major depressive disorder, single episode, severe without psychotic features: Secondary | ICD-10-CM

## 2018-01-31 DIAGNOSIS — M5441 Lumbago with sciatica, right side: Secondary | ICD-10-CM

## 2018-01-31 DIAGNOSIS — M5442 Lumbago with sciatica, left side: Secondary | ICD-10-CM

## 2018-01-31 DIAGNOSIS — F411 Generalized anxiety disorder: Secondary | ICD-10-CM

## 2018-01-31 DIAGNOSIS — Z1211 Encounter for screening for malignant neoplasm of colon: Secondary | ICD-10-CM

## 2018-01-31 DIAGNOSIS — G2581 Restless legs syndrome: Secondary | ICD-10-CM

## 2018-01-31 DIAGNOSIS — Z1239 Encounter for other screening for malignant neoplasm of breast: Secondary | ICD-10-CM

## 2018-01-31 DIAGNOSIS — G8929 Other chronic pain: Secondary | ICD-10-CM

## 2018-01-31 DIAGNOSIS — R61 Generalized hyperhidrosis: Secondary | ICD-10-CM

## 2018-01-31 MED ORDER — HYDROXYZINE HCL 25 MG PO TABS: 25.0000 mg | ORAL_TABLET | Freq: Two times a day (BID) | ORAL | 0 refills | Status: DC | PRN

## 2018-01-31 MED ORDER — VENLAFAXINE HCL ER 37.5 MG PO CP24: 37.5000 mg | ORAL_CAPSULE | Freq: Every day | ORAL | 0 refills | Status: DC

## 2018-01-31 MED ORDER — CLONIDINE HCL 0.1 MG PO TABS: ORAL_TABLET | ORAL | 1 refills | Status: DC

## 2018-01-31 MED ORDER — OMEPRAZOLE 40 MG PO CPDR
40.0000 mg | DELAYED_RELEASE_CAPSULE | Freq: Every morning | ORAL | 1 refills | Status: DC
Start: 2018-01-31 — End: 2018-06-17

## 2018-01-31 MED ORDER — TOPIRAMATE 50 MG PO TABS: 50.0000 mg | ORAL_TABLET | Freq: Two times a day (BID) | ORAL | 0 refills | Status: DC

## 2018-01-31 NOTE — Telephone Encounter (Signed)
Refill request for general medication. Topamax  Last office visit 07/26/17   Follow up on 01/31/18

## 2018-01-31 NOTE — Progress Notes (Signed)
Name: Kelly [name]   MRN: [id number]    DOB: Feb 19, 1966   Date:01/31/2018       Progress Note  Subjective  Chief Complaint  Chief Complaint  Patient presents with  . Medication Refill    6 month-patient lost insurance and has not gotten reinstated yet-going through the process of having it back.   . Dysmetabolic Syndrome  . Depression    Wants to get back to normal-don't want to leave the house no desire to get out of the bed.  . Migraine  . RLS    Have been very severe-taking Benadryl and Tramadol to just try to go to sleep.     HPI  Major Depression: shewas seeing Dr. Einar Grad and she was doing better, however her husband was unemployed, they were going to court because of adopted daughter Anola Gurney and it caused a lot of stress, she ran out of medication because of lack of insurance 08/2017 and depression got much worse. She states not motivation, lack of appetite, weight loss, hypersomnia during the day and cannot sleep at night. Crying spells. She is worried about not working but does not have the energy to get a job.   Migraine Headaches: She was doing well on Topamax but ran out because of cost of medication. She described and temporal area pressure, occasionally associated with vertigo and nausea. She takes ibuprofen prn. She would like a refill of medication   Morbid obesity/Metabolic syndrome: she has lost weight since last visit, she was taking Trulicity but wants to hold off on medication at this time because of cost. She denies polyphagia, polyuria or polydipsia.  RLS: she states topamax seems to help, but Mirapex does not help.  Night sweats: getting worse, she would like to resume Clonidine qhs.    Patient Active Problem List   Diagnosis Date Noted  . Morbid obesity ([place]) 07/26/2017  . Spondylolisthesis of lumbosacral region 02/11/2016  . Hematuria 04/09/2015  . Hyperglycemia 04/06/2015  . Right lumbar radiculitis 12/28/2014  . Moderate recurrent major  depression ([place]) 12/27/2014  . Gastro-esophageal reflux disease without esophagitis 12/27/2014  . Bulge of lumbar disc without myelopathy 12/27/2014  . Dysmetabolic syndrome 82/95/6213  . Migraine without aura and responsive to treatment 12/27/2014  . Osteoarthrosis 12/27/2014  . Obesity (BMI 30-39.9) 12/27/2014  . Restless leg 12/27/2014  . Allergic rhinitis, seasonal 12/27/2014    Past Surgical History:  Procedure Laterality Date  . BACK SURGERY    . DIAGNOSTIC LAPAROSCOPY    . DILATION AND CURETTAGE OF UTERUS    . KNEE ARTHROSCOPY WITH MEDIAL MENISECTOMY Left 01/14/2017   Procedure: KNEE ARTHROSCOPY WITH MEDIAL AND LATERAL MENISECTOMY;  Surgeon: Hessie Knows, MD;  Location: [facility] ORS;  Service: Orthopedics;  Laterality: Left;  Partial Knee menisectomy  . KNEE SURGERY Left 05/2011   Dr. Rudene Christians- Arthroscopic  . LUMBAR LAMINECTOMY/DECOMPRESSION MICRODISCECTOMY N/A 09/16/2016   Procedure: LUMBAR LAMINECTOMY/DECOMPRESSION MICRODISCECTOMY 1 LEVEL L5-S1;  Surgeon: Meade Maw, MD;  Location: [facility] ORS;  Service: Neurosurgery;  Laterality: N/A;  . TONSILLECTOMY AND ADENOIDECTOMY    . URETHRAL STRICTURE DILATATION      Family History  Problem Relation Age of Onset  . Cancer Paternal Grandmother     Social History   Socioeconomic History  . Marital status: Married    Spouse name: Jenny Reichmann  . Number of children: 2  . Years of education: Not on file  . Highest education level: Associate degree: occupational, Hotel manager, or vocational program  Occupational History  . Occupation:  not employed  Social Needs  . Financial resource strain: Very hard  . Food insecurity:    Worry: Often true    Inability: Often true  . Transportation needs:    Medical: No    Non-medical: Yes  Tobacco Use  . Smoking status: Former Smoker    Packs/day: 0.75    Years: 2.00    Pack years: 1.50    Types: Cigarettes    Start date: 07/06/1981    Last attempt to quit: 07/07/1983    Years since quitting: 34.5   . Smokeless tobacco: Never Used  Substance and Sexual Activity  . Alcohol use: No    Alcohol/week: 0.0 oz  . Drug use: No  . Sexual activity: Yes    Partners: Male  Lifestyle  . Physical activity:    Days per week: 7 days    Minutes per session: 30 min  . Stress: Very much  Relationships  . Social connections:    Talks on phone: More than three times a week    Gets together: Twice a week    Attends religious service: Never    Active member of club or organization: No    Attends meetings of clubs or organizations: Never    Relationship status: Married  . Intimate partner violence:    Fear of current or ex partner: No    Emotionally abused: No    Physically abused: No    Forced sexual activity: No  Other Topics Concern  . Not on file  Social History Narrative   Struggling.    Married and husband has a lot of medical problems, employed but not getting paid - he was on short term disability but is pending to get on long term disability      Current Outpatient Medications:  .  cloNIDine (CATAPRES) 0.1 MG tablet, TAKE 1 TABLET (0.1 MG TOTAL) BY MOUTH AT BEDTIME AS NEEDED., Disp: 90 tablet, Rfl: 1 .  EPINEPHrine 0.3 mg/0.3 mL IJ SOAJ injection, Inject into the muscle., Disp: , Rfl:  .  hydrOXYzine (ATARAX/VISTARIL) 25 MG tablet, Take 1 tablet (25 mg total) by mouth 2 (two) times daily as needed for anxiety., Disp: 60 tablet, Rfl: 0 .  ibuprofen (ADVIL,MOTRIN) 600 MG tablet, Take by mouth., Disp: , Rfl:  .  loratadine (CLARITIN) 10 MG tablet, Take 1 tablet (10 mg total) by mouth daily., Disp: 90 tablet, Rfl: 5 .  omeprazole (PRILOSEC) 40 MG capsule, Take 1 capsule (40 mg total) by mouth every morning., Disp: 90 capsule, Rfl: 5 .  ranitidine (ZANTAC) 150 MG tablet, Take 1 tablet (150 mg total) by mouth at bedtime., Disp: 90 tablet, Rfl: 5 .  topiramate (TOPAMAX) 50 MG tablet, TAKE 1 TABLET BY MOUTH TWICE DAILY, Disp: 180 tablet, Rfl: 0 .  traZODone (DESYREL) 100 MG tablet, Take 2  tablets (200 mg total) by mouth at bedtime as needed for sleep. (Patient taking differently: Take 150 mg by mouth at bedtime as needed for sleep. ), Disp: 60 tablet, Rfl: 2 .  triamcinolone (NASACORT ALLERGY 24HR) 55 MCG/ACT AERO nasal inhaler, Place 2 sprays into the nose at bedtime., Disp: 1 Inhaler, Rfl: 5 .  venlafaxine XR (EFFEXOR-XR) 150 MG 24 hr capsule, Take 1 capsule (150 mg total) by mouth daily., Disp: 30 capsule, Rfl: 2  Allergies  Allergen Reactions  . Penicillins Anaphylaxis, Hives, Nausea And Vomiting and Swelling    Has patient had a PCN reaction causing immediate rash, facial/tongue/throat swelling, SOB or lightheadedness with hypotension:  Yes Has patient had a PCN reaction causing severe rash involving mucus membranes or skin necrosis: Yes Has patient had a PCN reaction that required hospitalization No Has patient had a PCN reaction occurring within the last 10 years: Yes If all of the above answers are "NO", then may proceed with Cephalosporin use.   . Chlorhexidine Gluconate Itching and Rash  . Ciprofloxacin Hives  . Clindamycin/Lincomycin Hives  . Erythromycin Hives and Nausea And Vomiting  . Keflex [Cephalexin] Hives  . Lyrica [Pregabalin]     Dizziness, syncope  . Nitrofurantoin Monohyd Macro Hives and Nausea And Vomiting  . Other Hives  . Sulfa Antibiotics Hives and Nausea And Vomiting    Rapid heart rate  . Tetracyclines & Related Hives  . Adhesive [Tape] Rash  . Latex Rash  . Vancomycin Itching and Rash     ROS  Constitutional: Negative for fever , positive for weight change - lost weight since last visit .  Respiratory: Negative for cough and shortness of breath.   Cardiovascular: Negative for chest pain or palpitations.  Gastrointestinal: Negative for abdominal pain, no bowel changes.  Musculoskeletal: Negative for gait problem or joint swelling.  Skin: Negative for rash.   Neurological: Negative for dizziness , positive for intermittent  headache.   No other specific complaints in a complete review of systems (except as listed in HPI above).  Objective  Vitals:   01/31/18 0935  BP: 114/76  Pulse: (!) 103  Resp: 16  Temp: 97.8 F (36.6 C)  TempSrc: Oral  SpO2: 97%  Weight: 220 lb 6.4 oz (100 kg)  Height: 5\' 5"  (1.651 m)    Body mass index is 36.68 kg/m.  Physical Exam  Constitutional: Patient appears well-developed and well-nourished. Obese  No distress.  HEENT: head atraumatic, normocephalic, pupils equal and reactive to light,  neck supple, throat within normal limits Cardiovascular: Normal rate, regular rhythm and normal heart sounds.  No murmur heard. No BLE edema. Pulmonary/Chest: Effort normal and breath sounds normal. No respiratory distress. Abdominal: Soft.  There is no tenderness. Muscular Skeletal: tender during palpation of lumbar spine  Psychiatric: Patient is depressed, cried during visit.   PHQ2/9: Depression screen [facility] 2/9 01/31/2018 08/04/2017 07/22/2017 06/25/2017 06/21/2017  Decreased Interest 3 0 0 1 0  Down, Depressed, Hopeless 3 0 0 1 0  PHQ - 2 Score 6 0 0 2 0  Altered sleeping 3 - - 2 -  Tired, decreased energy 3 - - 2 -  Change in appetite 2 - - 3 -  Feeling bad or failure about yourself  3 - - 1 -  Trouble concentrating 1 - - 0 -  Moving slowly or fidgety/restless 1 - - 2 -  Suicidal thoughts 1 - - 0 -  PHQ-9 Score 20 - - 12 -  Difficult doing work/chores Very difficult - - Somewhat difficult -  Some recent data might be hidden     Fall Risk: Fall Risk  08/04/2017 07/22/2017 06/25/2017 06/21/2017 06/17/2017  Falls in the past year? No No Yes No No  Number falls in past yr: - - 1 - -  Comment - - - - -  Injury with Fall? - - Yes - -  Comment - - - - -  Follow up - - - - -     Functional Status Survey: Is the patient deaf or have difficulty hearing?: No Does the patient have difficulty seeing, even when wearing glasses/contacts?: Yes(contacts) Does the patient have difficulty  concentrating, remembering, or making decisions?: Yes Does the patient have difficulty walking or climbing stairs?: Yes Does the patient have difficulty dressing or bathing?: Yes Does the patient have difficulty doing errands alone such as visiting a doctor's office or shopping?: Yes(Has not been driving due to depression and excessive sleeping)   Assessment & Plan  1. Severe major depression (HCC)  - venlafaxine XR (EFFEXOR-XR) 37.5 MG 24 hr capsule; Take 1-2 capsules (37.5-75 mg total) by mouth daily. First week take one after that two in am and follow up with psychiatrist  Dispense: 60 capsule; Refill: 0  2. Migraine without aura and responsive to treatment  - topiramate (TOPAMAX) 50 MG tablet; Take 1 tablet (50 mg total) by mouth 2 (two) times daily.  Dispense: 180 tablet; Refill: 0  3. Night sweats  - cloNIDine (CATAPRES) 0.1 MG tablet; TAKE 1 TABLET (0.1 MG TOTAL) BY MOUTH AT BEDTIME AS NEEDED.  Dispense: 90 tablet; Refill: 1 - hydrOXYzine (ATARAX/VISTARIL) 25 MG tablet; Take 1 tablet (25 mg total) by mouth 2 (two) times daily as needed for anxiety.  Dispense: 60 tablet; Refill: 0  4. Restless leg  - hydrOXYzine (ATARAX/VISTARIL) 25 MG tablet; Take 1 tablet (25 mg total) by mouth 2 (two) times daily as needed for anxiety.  Dispense: 60 tablet; Refill: 0  5. Gastro-esophageal reflux disease without esophagitis  - omeprazole (PRILOSEC) 40 MG capsule; Take 1 capsule (40 mg total) by mouth every morning.  Dispense: 90 capsule; Refill: 1  6. Morbid obesity ([place])  BMI above 30 with co-morbidities   7. Chronic low back pain with bilateral sciatica, unspecified back pain laterality  She used to see Dr. Holley Raring but ran out of insurance, but will contact therm to go back again   8. GAD (generalized anxiety disorder)  Resume hydroxyzine  - hydrOXYzine (ATARAX/VISTARIL) 25 MG tablet; Take 1 tablet (25 mg total) by mouth 2 (two) times daily as needed for anxiety.  Dispense: 60 tablet;  Refill: 0  9. Breast cancer screening  - MM DIGITAL SCREENING BILATERAL; Future  10. Colon cancer screening  - Cologuard

## 2018-02-01 ENCOUNTER — Telehealth: Payer: Self-pay

## 2018-02-01 DIAGNOSIS — G43009 Migraine without aura, not intractable, without status migrainosus: Secondary | ICD-10-CM

## 2018-02-01 DIAGNOSIS — J302 Other seasonal allergic rhinitis: Secondary | ICD-10-CM

## 2018-02-01 DIAGNOSIS — F322 Major depressive disorder, single episode, severe without psychotic features: Secondary | ICD-10-CM

## 2018-02-01 DIAGNOSIS — R61 Generalized hyperhidrosis: Secondary | ICD-10-CM

## 2018-02-01 DIAGNOSIS — F411 Generalized anxiety disorder: Secondary | ICD-10-CM

## 2018-02-01 NOTE — Telephone Encounter (Signed)
Copied from [place] [id number]. Topic: General - Other >> Feb 01, 2018 12:04 PM Keene Breath wrote: Reason for CRM: Patient called to change pharmacy for all medications to [place], [place] [phone] (Phone) [phone] (Fax).  She stated that this pharmacy was more affordable for her.  She would like for her prescriptions from her visit yesterday, 01/31/18 to go to this [facility] as well.  CB# [phone].

## 2018-02-01 NOTE — Telephone Encounter (Signed)
She needs to call the new pharmacy and tell them to request rx to be transferred to [facility]. You can also call [facility] and cancel all rx and after that I can send it to [facility] if needed.

## 2018-02-02 NOTE — Telephone Encounter (Signed)
Called Kelly [name] and the patient will need to call to provide her information and put in for a request of transfer. Left message notifying patient to call [facility] for the request.

## 2018-03-09 ENCOUNTER — Encounter: Payer: Self-pay | Admitting: Family Medicine

## 2018-03-09 ENCOUNTER — Ambulatory Visit (INDEPENDENT_AMBULATORY_CARE_PROVIDER_SITE_OTHER): Payer: PRIVATE HEALTH INSURANCE | Admitting: Family Medicine

## 2018-03-09 VITALS — BP 114/72 | HR 80 | Temp 97.7°F | Resp 18 | Ht 65.0 in | Wt 212.6 lb

## 2018-03-09 DIAGNOSIS — R61 Generalized hyperhidrosis: Secondary | ICD-10-CM

## 2018-03-09 DIAGNOSIS — F322 Major depressive disorder, single episode, severe without psychotic features: Secondary | ICD-10-CM

## 2018-03-09 DIAGNOSIS — Z23 Encounter for immunization: Secondary | ICD-10-CM

## 2018-03-09 DIAGNOSIS — G43009 Migraine without aura, not intractable, without status migrainosus: Secondary | ICD-10-CM

## 2018-03-09 DIAGNOSIS — F411 Generalized anxiety disorder: Secondary | ICD-10-CM

## 2018-03-09 DIAGNOSIS — K219 Gastro-esophageal reflux disease without esophagitis: Secondary | ICD-10-CM

## 2018-03-09 MED ORDER — VENLAFAXINE HCL ER 150 MG PO CP24: 150.0000 mg | ORAL_CAPSULE | Freq: Every day | ORAL | 0 refills | Status: DC

## 2018-03-09 MED ORDER — HYDROXYZINE HCL 25 MG PO TABS: 25.0000 mg | ORAL_TABLET | Freq: Two times a day (BID) | ORAL | 0 refills | Status: DC | PRN

## 2018-03-09 NOTE — Progress Notes (Signed)
Name: Kelly [name]   MRN: [id number]    DOB: [birth year]   Date:03/09/2018       Progress Note  Subjective  Chief Complaint  Chief Complaint  Patient presents with  . Follow-up    1 month F/U  . GAD    States the Clonidine made her feel dizzy headed and had to lay off of it  . Night Sweats    Trazodone makes her stay up more and quit taking it-now she is sleeping better.   . Depression  . Gastroesophageal Reflux    Has been better     HPI  Major Depression: shewas seeing Dr. Barnetta Hammersmith was doing better, however her husband was unemployed, they were going to court because of adopted daughter Anola Gurney and it caused a lot of stress, she ran out of medication because of lack of insurance 08/2017 and depression got much worse. She is back on Effexor, husband is working again and not struggling as much. Able to get up and move, sleeping better even without Trazodone. She states grandchildren helps her emotionally. Also helping a friend home- school and may help another friend baby sit a new born soon.   Migraine Headaches: She states migraine not as frequent, stopped topamax and does not want to go back on medication. She states migraine is  Pain is usually pressure like on temporal area and frontal area. Associated with nausea, phonophobia and photophobia, taking prn nsaid's and takes a nap.   Morbid obesity/Metabolic syndrome: she lost 12 more lbs since last visit, she states not sleeping all day anymore, helping a friend home school a child and is starting to move more. Discussed adding walking daily   Night sweats: she states while on clonidine she felt very tired and stopped medication. She states sleeping has improved.   GERD: under control with omeprazole at this time.   Patient Active Problem List   Diagnosis Date Noted  . Morbid obesity ([place]) 07/26/2017  . Spondylolisthesis of lumbosacral region 02/11/2016  . Hematuria 04/09/2015  . Hyperglycemia 04/06/2015  . Right  lumbar radiculitis 12/28/2014  . Moderate recurrent major depression ([place]) 12/27/2014  . Gastro-esophageal reflux disease without esophagitis 12/27/2014  . Bulge of lumbar disc without myelopathy 12/27/2014  . Dysmetabolic syndrome 82/50/5397  . Migraine without aura and responsive to treatment 12/27/2014  . Osteoarthrosis 12/27/2014  . Obesity (BMI 30-39.9) 12/27/2014  . Restless leg 12/27/2014  . Allergic rhinitis, seasonal 12/27/2014    Past Surgical History:  Procedure Laterality Date  . BACK SURGERY    . DIAGNOSTIC LAPAROSCOPY    . DILATION AND CURETTAGE OF UTERUS    . KNEE ARTHROSCOPY WITH MEDIAL MENISECTOMY Left 01/14/2017   Procedure: KNEE ARTHROSCOPY WITH MEDIAL AND LATERAL MENISECTOMY;  Surgeon: Hessie Knows, MD;  Location: [facility] ORS;  Service: Orthopedics;  Laterality: Left;  Partial Knee menisectomy  . KNEE SURGERY Left 05/2011   Dr. Rudene Christians- Arthroscopic  . LUMBAR LAMINECTOMY/DECOMPRESSION MICRODISCECTOMY N/A 09/16/2016   Procedure: LUMBAR LAMINECTOMY/DECOMPRESSION MICRODISCECTOMY 1 LEVEL L5-S1;  Surgeon: Meade Maw, MD;  Location: [facility] ORS;  Service: Neurosurgery;  Laterality: N/A;  . TONSILLECTOMY AND ADENOIDECTOMY    . URETHRAL STRICTURE DILATATION      Family History  Problem Relation Age of Onset  . Cancer Paternal Grandmother     Social History   Socioeconomic History  . Marital status: Married    Spouse name: Jenny Reichmann  . Number of children: 2  . Years of education: 49  . Highest education level:  Associate degree: occupational, technical, or vocational program  Occupational History  . 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 Financial resource strain: Hard  . Food insecurity:    Worry: Often true    Inability: Often true  . Transportation needs:    Medical: No    Non-medical: Yes  Tobacco Use  . Smoking status: Former Smoker    Packs/day: 0.75    Years: 2.00    Pack years: 1.50    Types: Cigarettes    Start date: 07/06/1981    Last attempt to  quit: 07/07/1983    Years since quitting: 34.6  . Smokeless tobacco: Never Used  Substance and Sexual Activity  . Alcohol use: No    Alcohol/week: 0.0 standard drinks  . Drug use: No  . Sexual activity: Yes    Partners: Male  Lifestyle  . Physical activity:    Days per week: 0 days    Minutes per session: 0 min  . Stress: To some extent  Relationships  . Social connections:    Talks on phone: More than three times a week    Gets together: Twice a week    Attends religious service: Never    Active member of club or organization: No    Attends meetings of clubs or organizations: Never    Relationship status: Married  . Intimate partner violence:    Fear of current or ex partner: No    Emotionally abused: No    Physically abused: No    Forced sexual activity: No  Other Topics Concern  . Not on file  Social History Narrative   Married and husband has a lot of medical problems, he was unemployed but has a job now.      Current Outpatient Medications:  .  omeprazole (PRILOSEC) 40 MG capsule, Take 1 capsule (40 mg total) by mouth every morning., Disp: 90 capsule, Rfl: 1 .  venlafaxine XR (EFFEXOR-XR) 150 MG 24 hr capsule, Take 1 capsule (150 mg total) by mouth daily with breakfast. First week take one after that two in am and follow up with psychiatrist, Disp: 30 capsule, Rfl: 0 .  EPINEPHrine 0.3 mg/0.3 mL IJ SOAJ injection, Inject into the muscle., Disp: , Rfl:  .  hydrOXYzine (ATARAX/VISTARIL) 25 MG tablet, Take 1 tablet (25 mg total) by mouth 2 (two) times daily as needed for anxiety., Disp: 60 tablet, Rfl: 0 .  ibuprofen (ADVIL,MOTRIN) 600 MG tablet, Take by mouth., Disp: , Rfl:  .  loratadine (CLARITIN) 10 MG tablet, Take 1 tablet (10 mg total) by mouth daily. (Patient not taking: Reported on 03/09/2018), Disp: 90 tablet, Rfl: 5  Allergies  Allergen Reactions  . Penicillins Anaphylaxis, Hives, Nausea And Vomiting and Swelling    Has patient had a PCN reaction causing immediate  rash, facial/tongue/throat swelling, SOB or lightheadedness with hypotension: Yes Has patient had a PCN reaction causing severe rash involving mucus membranes or skin necrosis: Yes Has patient had a PCN reaction that required hospitalization No Has patient had a PCN reaction occurring within the last 10 years: Yes If all of the above answers are "NO", then may proceed with Cephalosporin use.   . Chlorhexidine Gluconate Itching and Rash  . Ciprofloxacin Hives  . Clindamycin/Lincomycin Hives  . Erythromycin Hives and Nausea And Vomiting  . Keflex [Cephalexin] Hives  . Lyrica [Pregabalin]     Dizziness, syncope  . Nitrofurantoin Monohyd Macro Hives and Nausea And Vomiting  . Other Hives  . Sulfa Antibiotics  Hives and Nausea And Vomiting    Rapid heart rate  . Tetracyclines & Related Hives  . Adhesive [Tape] Rash  . Latex Rash  . Vancomycin Itching and Rash     ROS  Constitutional: Negative for fever, positive for weight change.  Respiratory: Negative for cough and shortness of breath.   Cardiovascular: Negative for chest pain or palpitations.  Gastrointestinal: Negative for abdominal pain, no bowel changes.  Musculoskeletal: Negative for gait problem or joint swelling.  Skin: Negative for rash.  Neurological: Negative for dizziness , positive for intermittent  headache.  No other specific complaints in a complete review of systems (except as listed in HPI above).  Objective  Vitals:   03/09/18 1057  BP: 114/72  Pulse: 80  Resp: 18  Temp: 97.7 F (36.5 C)  TempSrc: Oral  SpO2: 98%  Weight: 212 lb 9.6 oz (96.4 kg)  Height: 5\' 5"  (1.651 m)    Body mass index is 35.38 kg/m.  Physical Exam  Constitutional: Patient appears well-developed and well-nourished. Obese No distress.  HEENT: head atraumatic, normocephalic, pupils equal and reactive to light, neck supple, throat within normal limits Cardiovascular: Normal rate, regular rhythm and normal heart sounds.  No murmur  heard. No BLE edema. Pulmonary/Chest: Effort normal and breath sounds normal. No respiratory distress. Abdominal: Soft.  There is no tenderness. Psychiatric: Patient has a normal mood and affect. behavior is normal. Judgment and thought content normal.  PHQ2/9: Depression screen [facility] 2/9 03/09/2018 01/31/2018 08/04/2017 07/22/2017 06/25/2017  Decreased Interest 1 3 0 0 1  Down, Depressed, Hopeless 2 3 0 0 1  PHQ - 2 Score 3 6 0 0 2  Altered sleeping 1 3 - - 2  Tired, decreased energy 1 3 - - 2  Change in appetite 2 2 - - 3  Feeling bad or failure about yourself  2 3 - - 1  Trouble concentrating 2 1 - - 0  Moving slowly or fidgety/restless 1 1 - - 2  Suicidal thoughts 0 1 - - 0  PHQ-9 Score 12 20 - - 12  Difficult doing work/chores Somewhat difficult Very difficult - - Somewhat difficult  Some recent data might be hidden    GAD 7 : Generalized Anxiety Score 03/09/2018 01/31/2018 06/25/2017 02/27/2016  Nervous, Anxious, on Edge 1 3 2 3   Control/stop worrying 2 2 1 3   Worry too much - different things 2 3 1 3   Trouble relaxing 1 2 1 3   Restless 1 1 1 3   Easily annoyed or irritable 1 2 2 3   Afraid - awful might happen 1 2 0 3  Total GAD 7 Score 9 15 8 21   Anxiety Difficulty Somewhat difficult Very difficult Somewhat difficult Very difficult    Fall Risk: Fall Risk  03/09/2018 08/04/2017 07/22/2017 06/25/2017 06/21/2017  Falls in the past year? No No No Yes No  Number falls in past yr: - - - 1 -  Comment - - - - -  Injury with Fall? - - - Yes -  Comment - - - - -  Follow up - - - - -     Functional Status Survey: Is the patient deaf or have difficulty hearing?: No Does the patient have difficulty seeing, even when wearing glasses/contacts?: Yes Does the patient have difficulty concentrating, remembering, or making decisions?: Yes Does the patient have difficulty walking or climbing stairs?: Yes Does the patient have difficulty dressing or bathing?: Yes Does the patient have difficulty  doing errands  alone such as visiting a doctor's office or shopping?: Yes    Assessment & Plan  1. Severe major depression ([place])  She still has rx from Dr. Einar Grad on 150 mg now and is feeling better  - venlafaxine XR (EFFEXOR-XR) 150 MG 24 hr capsule; Take 1 capsule (150 mg total) by mouth daily with breakfast. First week take one after that two in am and follow up with psychiatrist  Dispense: 30 capsule; Refill: 0  2. GAD (generalized anxiety disorder)  - hydrOXYzine (ATARAX/VISTARIL) 25 MG tablet; Take 1 tablet (25 mg total) by mouth 2 (two) times daily as needed for anxiety.  Dispense: 60 tablet; Refill: 0

## 2018-03-09 NOTE — Addendum Note (Signed)
Addended by: Inda Coke on: 03/09/2018 11:37 AM   Modules accepted: Orders

## 2018-06-08 ENCOUNTER — Ambulatory Visit: Payer: Self-pay | Admitting: Family Medicine

## 2018-06-16 DIAGNOSIS — H524 Presbyopia: Secondary | ICD-10-CM | POA: Diagnosis not present

## 2018-06-17 ENCOUNTER — Encounter: Payer: Self-pay | Admitting: Family Medicine

## 2018-06-17 ENCOUNTER — Ambulatory Visit: Payer: BLUE CROSS/BLUE SHIELD | Admitting: Family Medicine

## 2018-06-17 VITALS — BP 118/74 | HR 76 | Temp 97.5°F | Resp 16 | Ht 65.0 in | Wt 210.9 lb

## 2018-06-17 DIAGNOSIS — G8929 Other chronic pain: Secondary | ICD-10-CM

## 2018-06-17 DIAGNOSIS — E8881 Metabolic syndrome: Secondary | ICD-10-CM

## 2018-06-17 DIAGNOSIS — F331 Major depressive disorder, recurrent, moderate: Secondary | ICD-10-CM | POA: Diagnosis not present

## 2018-06-17 DIAGNOSIS — K219 Gastro-esophageal reflux disease without esophagitis: Secondary | ICD-10-CM

## 2018-06-17 DIAGNOSIS — G2581 Restless legs syndrome: Secondary | ICD-10-CM

## 2018-06-17 DIAGNOSIS — M5441 Lumbago with sciatica, right side: Secondary | ICD-10-CM

## 2018-06-17 DIAGNOSIS — Z79899 Other long term (current) drug therapy: Secondary | ICD-10-CM | POA: Diagnosis not present

## 2018-06-17 DIAGNOSIS — Z23 Encounter for immunization: Secondary | ICD-10-CM | POA: Diagnosis not present

## 2018-06-17 DIAGNOSIS — F411 Generalized anxiety disorder: Secondary | ICD-10-CM

## 2018-06-17 DIAGNOSIS — M5442 Lumbago with sciatica, left side: Secondary | ICD-10-CM

## 2018-06-17 MED ORDER — ROPINIROLE HCL 0.5 MG PO TABS: 0.5000 mg | ORAL_TABLET | Freq: Every day | ORAL | 0 refills | Status: DC

## 2018-06-17 MED ORDER — OMEPRAZOLE 40 MG PO CPDR: 40.0000 mg | DELAYED_RELEASE_CAPSULE | Freq: Every morning | ORAL | 1 refills | Status: DC

## 2018-06-17 MED ORDER — VENLAFAXINE HCL ER 150 MG PO CP24
150.0000 mg | ORAL_CAPSULE | Freq: Every day | ORAL | 0 refills | Status: DC
Start: 2018-06-17 — End: 2018-08-24

## 2018-06-17 NOTE — Progress Notes (Signed)
Name: Kelly [name]   MRN: [id number]    DOB: 1965/12/07   Date:06/17/2018       Progress Note  Subjective  Chief Complaint  Chief Complaint  Patient presents with  . Medication Refill    3 month F/U  . Depression    Her and her husband now have to pay Child support to DSS for Caryl Pina since they gave her back to DSS. She is stressed over financial matters.  . Migraine    Has been better   . Night Sweats  . Gastroesophageal Reflux  . Obesity    Has losted 3 pounds since last visit  . Restless Leg Syndrome    Has been really bad lately    HPI  Major Depression: shewas seeingDr. Barnetta Hammersmith was doing better, however her husband was unemployed, they were going to court because of adopted daughter Anola Gurney and it caused a lot of stress, she ran out of medication because of lack of insurance 08/2017 and depression got much worse, she has been stretching effexor and needs to go back to Dr. Einar Grad and therapist, I will send a 30 day supply of medication for now . Phq 9 is still high at 16, panic attacks not as frequent now.   Migraine Headaches: She states migraine not as frequent, stopped topamax , but resumed taking it prn lately, explained it does not work for prn use.She states migraine is pain is usually pressure like on temporal area and frontal area. Associated with nausea, phonophobia and photophobia, taking prn nsaid's and usually needs to take a nap.   Morbid obesity/Metabolic syndrome:she lost a few more pounds since last visit, she denies polyphagia, polydipsia or polyuria   Night sweats: she states while on clonidine she felt very tired and stopped medication. She has irregular cycles also   GERD: under control with omeprazole at this time. She needs refill of PPI   RLS: she states used to take Mirapex and worked well for  Long time but towards the end not efficacious anymore, she also gabapentin for pain and not sure if helped with leg, we will try Requip, check CBC  and ferritin   Morbid obesity: based on BMI above 35 and co-morbidities: metabolic syndrome, back pain, GERD  Patient Active Problem List   Diagnosis Date Noted  . Morbid obesity ([place]) 07/26/2017  . Spondylolisthesis of lumbosacral region 02/11/2016  . Hematuria 04/09/2015  . Hyperglycemia 04/06/2015  . Right lumbar radiculitis 12/28/2014  . Moderate recurrent major depression ([place]) 12/27/2014  . Gastro-esophageal reflux disease without esophagitis 12/27/2014  . Bulge of lumbar disc without myelopathy 12/27/2014  . Dysmetabolic syndrome 78/29/5621  . Migraine without aura and responsive to treatment 12/27/2014  . Osteoarthrosis 12/27/2014  . Obesity (BMI 30-39.9) 12/27/2014  . Restless leg 12/27/2014  . Allergic rhinitis, seasonal 12/27/2014    Past Surgical History:  Procedure Laterality Date  . BACK SURGERY    . DIAGNOSTIC LAPAROSCOPY    . DILATION AND CURETTAGE OF UTERUS    . KNEE ARTHROSCOPY WITH MEDIAL MENISECTOMY Left 01/14/2017   Procedure: KNEE ARTHROSCOPY WITH MEDIAL AND LATERAL MENISECTOMY;  Surgeon: Hessie Knows, MD;  Location: [facility] ORS;  Service: Orthopedics;  Laterality: Left;  Partial Knee menisectomy  . KNEE SURGERY Left 05/2011   Dr. Rudene Christians- Arthroscopic  . LUMBAR LAMINECTOMY/DECOMPRESSION MICRODISCECTOMY N/A 09/16/2016   Procedure: LUMBAR LAMINECTOMY/DECOMPRESSION MICRODISCECTOMY 1 LEVEL L5-S1;  Surgeon: Meade Maw, MD;  Location: [facility] ORS;  Service: Neurosurgery;  Laterality: N/A;  . TONSILLECTOMY AND  ADENOIDECTOMY    . URETHRAL STRICTURE DILATATION      Family History  Problem Relation Age of Onset  . Cancer Paternal Grandmother     Social History   Socioeconomic History  . Marital status: Married    Spouse name: Jenny Reichmann  . Number of children: 2  . Years of education: 61  . Highest education level: Associate degree: occupational, Hotel manager, or vocational program  Occupational History  . 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 Financial  resource strain: Hard  . Food insecurity:    Worry: Often true    Inability: Often true  . Transportation needs:    Medical: No    Non-medical: Yes  Tobacco Use  . Smoking status: Former Smoker    Packs/day: 0.75    Years: 2.00    Pack years: 1.50    Types: Cigarettes    Start date: 07/06/1981    Last attempt to quit: 07/07/1983    Years since quitting: 34.9  . Smokeless tobacco: Never Used  Substance and Sexual Activity  . Alcohol use: No    Alcohol/week: 0.0 standard drinks  . Drug use: No  . Sexual activity: Yes    Partners: Male  Lifestyle  . Physical activity:    Days per week: 0 days    Minutes per session: 0 min  . Stress: To some extent  Relationships  . Social connections:    Talks on phone: More than three times a week    Gets together: Twice a week    Attends religious service: Never    Active member of club or organization: No    Attends meetings of clubs or organizations: Never    Relationship status: Married  . Intimate partner violence:    Fear of current or ex partner: No    Emotionally abused: No    Physically abused: No    Forced sexual activity: No  Other Topics Concern  . Not on file  Social History Narrative   Married and husband has a lot of medical problems, he has a job       Current Outpatient Medications:  .  hydrOXYzine (ATARAX/VISTARIL) 25 MG tablet, Take 1 tablet (25 mg total) by mouth 2 (two) times daily as needed for anxiety., Disp: 60 tablet, Rfl: 0 .  loratadine (CLARITIN) 10 MG tablet, Take 1 tablet (10 mg total) by mouth daily., Disp: 90 tablet, Rfl: 5 .  omeprazole (PRILOSEC) 40 MG capsule, Take 1 capsule (40 mg total) by mouth every morning., Disp: 90 capsule, Rfl: 1 .  venlafaxine XR (EFFEXOR-XR) 150 MG 24 hr capsule, Take 1 capsule (150 mg total) by mouth daily with breakfast. First week take one after that two in am and follow up with psychiatrist, Disp: 30 capsule, Rfl: 0 .  EPINEPHrine 0.3 mg/0.3 mL IJ SOAJ injection, Inject  into the muscle., Disp: , Rfl:  .  ibuprofen (ADVIL,MOTRIN) 600 MG tablet, Take by mouth., Disp: , Rfl:   Allergies  Allergen Reactions  . Penicillins Anaphylaxis, Hives, Nausea And Vomiting and Swelling    Has patient had a PCN reaction causing immediate rash, facial/tongue/throat swelling, SOB or lightheadedness with hypotension: Yes Has patient had a PCN reaction causing severe rash involving mucus membranes or skin necrosis: Yes Has patient had a PCN reaction that required hospitalization No Has patient had a PCN reaction occurring within the last 10 years: Yes If all of the above answers are "NO", then may proceed with Cephalosporin use.   [place] [facility]  Chlorhexidine Gluconate Itching and Rash  . Ciprofloxacin Hives  . Clindamycin/Lincomycin Hives  . Erythromycin Hives and Nausea And Vomiting  . Keflex [Cephalexin] Hives  . Lyrica [Pregabalin]     Dizziness, syncope  . Nitrofurantoin Monohyd Macro Hives and Nausea And Vomiting  . Other Hives  . Sulfa Antibiotics Hives and Nausea And Vomiting    Rapid heart rate  . Tetracyclines & Related Hives  . Adhesive [Tape] Rash  . Latex Rash  . Vancomycin Itching and Rash    I personally reviewed active problem list, medication list, allergies with the patient/caregiver today.   ROS  Constitutional: Negative for fever or significant  weight change.  Respiratory: Negative for cough and shortness of breath.   Cardiovascular: Negative for chest pain or palpitations.  Gastrointestinal: Negative for abdominal pain, no bowel changes.  Musculoskeletal: Negative for gait problem or joint swelling.  Skin: Negative for rash.  Neurological: Negative for dizziness, positive for mild  headache.  No other specific complaints in a complete review of systems (except as listed in HPI above).  Objective  Vitals:   06/17/18 0958  BP: 118/74  Pulse: 76  Resp: 16  Temp: (!) 97.5 F (36.4 C)  TempSrc: Oral  SpO2: 99%  Weight: 210 lb 14.4 oz (95.7 kg)   Height: 5\' 5"  (1.651 m)    Body mass index is 35.1 kg/m.  Physical Exam  Constitutional: Patient appears well-developed and well-nourished. Obese  No distress.  HEENT: head atraumatic, normocephalic, pupils equal and reactive to light, neck supple, throat within normal limits Cardiovascular: Normal rate, regular rhythm and normal heart sounds.  No murmur heard. No BLE edema. Pulmonary/Chest: Effort normal and breath sounds normal. No respiratory distress. Abdominal: Soft.  There is no tenderness. Muscular Skeletal: pain during palpation of lumbar spine  Psychiatric: Patient has a normal mood and affect. behavior is normal. Judgment and thought content normal.  PHQ2/9: Depression screen [facility] 2/9 06/17/2018 03/09/2018 01/31/2018 08/04/2017 07/22/2017  Decreased Interest 3 1 3  0 0  Down, Depressed, Hopeless 2 2 3  0 0  PHQ - 2 Score 5 3 6  0 0  Altered sleeping 3 1 3  - -  Tired, decreased energy 2 1 3  - -  Change in appetite 2 2 2  - -  Feeling bad or failure about yourself  2 2 3  - -  Trouble concentrating 1 2 1  - -  Moving slowly or fidgety/restless 1 1 1  - -  Suicidal thoughts 0 0 1 - -  PHQ-9 Score 16 12 20  - -  Difficult doing work/chores Very difficult Somewhat difficult Very difficult - -  Some recent data might be hidden    Fall Risk: Fall Risk  06/17/2018 03/09/2018 08/04/2017 07/22/2017 06/25/2017  Falls in the past year? 1 No No No Yes  Number falls in past yr: 1 - - - 1  Comment - - - - -  Injury with Fall? 1 - - - Yes  Comment Twisted Left Knee and fell on Left Hip - - - -  Risk for fall due to : Impaired balance/gait;History of fall(s) - - - -  Risk for fall due to: Comment Left Knee and Right Hip are weak - - - -  Follow up - - - - -    Functional Status Survey: Is the patient deaf or have difficulty hearing?: No Does the patient have difficulty seeing, even when wearing glasses/contacts?: Yes Does the patient have difficulty concentrating, remembering, or making  decisions?: Yes(  A little) Does the patient have difficulty walking or climbing stairs?: Yes Does the patient have difficulty dressing or bathing?: No Does the patient have difficulty doing errands alone such as visiting a doctor's office or shopping?: Yes(Husband drives her around)    Assessment & Plan  1. Depression, major, recurrent, moderate (HCC)  - venlafaxine XR (EFFEXOR-XR) 150 MG 24 hr capsule; Take 1 capsule (150 mg total) by mouth daily with breakfast.  Dispense: 30 capsule; Refill: 0  2. Need for immunization against influenza  - Flu Vaccine QUAD 6+ mos PF IM (Fluarix Quad PF)  3. Gastro-esophageal reflux disease without esophagitis  - omeprazole (PRILOSEC) 40 MG capsule; Take 1 capsule (40 mg total) by mouth every morning.  Dispense: 90 capsule; Refill: 1  4. Morbid obesity ([place])  She has lost a few pounds since last visit   5. Dysmetabolic syndrome  - Lipid panel - Hemoglobin A1c  6. GAD (generalized anxiety disorder)  Needs to follow up with Dr. Einar Grad again , going to give 30 days of medication   7. Chronic low back pain with bilateral sciatica, unspecified back pain laterality  She used to see Dr. Holley Raring but lost insurance, she states pain is not as severe   8. Restless leg  - rOPINIRole (REQUIP) 0.5 MG tablet; Take 1 tablet (0.5 mg total) by mouth at bedtime.  Dispense: 90 tablet; Refill: 0 - Ferritin  9. Long-term use of high-risk medication  - CBC with Differential/Platelet - COMPLETE METABOLIC PANEL WITH GFR  10. Need for Tdap vaccination  - Tdap vaccine greater than or equal to 7yo IM  She will be around an infant that is due Feb [address], give booster today

## 2018-06-18 LAB — CBC WITH DIFFERENTIAL/PLATELET
BASOS PCT: 1 %
Basophils Absolute: 80 cells/uL (ref 0–200)
EOS ABS: 320 {cells}/uL (ref 15–500)
Eosinophils Relative: 4 %
HEMATOCRIT: 44.4 % (ref 35.0–45.0)
Hemoglobin: 14.6 g/dL (ref 11.7–15.5)
LYMPHS ABS: 3232 {cells}/uL (ref 850–3900)
MCH: 29.6 pg (ref 27.0–33.0)
MCHC: 32.9 g/dL (ref 32.0–36.0)
MCV: 90.1 fL (ref 80.0–100.0)
MPV: 11.4 fL (ref 7.5–12.5)
Monocytes Relative: 7 %
NEUTROS PCT: 47.6 %
Neutro Abs: 3808 cells/uL (ref 1500–7800)
PLATELETS: 295 10*3/uL (ref 140–400)
RBC: 4.93 10*6/uL (ref 3.80–5.10)
RDW: 12.4 % (ref 11.0–15.0)
TOTAL LYMPHOCYTE: 40.4 %
WBC: 8 10*3/uL (ref 3.8–10.8)
WBCMIX: 560 {cells}/uL (ref 200–950)

## 2018-06-18 LAB — LIPID PANEL
Cholesterol: 160 mg/dL (ref <200)
HDL: 56 mg/dL (ref >50)
LDL CHOLESTEROL (CALC): 82 mg/dL
Non-HDL Cholesterol (Calc): 104 mg/dL (calc) (ref <130)
TRIGLYCERIDES: 119 mg/dL (ref <150)
Total CHOL/HDL Ratio: 2.9 (calc) (ref <5.0)

## 2018-06-18 LAB — COMPLETE METABOLIC PANEL WITH GFR
AG RATIO: 1.9 (calc) (ref 1.0–2.5)
ALT: 23 U/L (ref 6–29)
AST: 22 U/L (ref 10–35)
Albumin: 4.2 g/dL (ref 3.6–5.1)
Alkaline phosphatase (APISO): 55 U/L (ref 33–130)
BILIRUBIN TOTAL: 0.7 mg/dL (ref 0.2–1.2)
BUN: 9 mg/dL (ref 7–25)
CALCIUM: 9.2 mg/dL (ref 8.6–10.4)
CO2: 27 mmol/L (ref 20–32)
CREATININE: 0.73 mg/dL (ref 0.50–1.05)
Chloride: 107 mmol/L (ref 98–110)
GFR, Est African American: 109 mL/min/{1.73_m2} (ref >=60)
GFR, Est Non African American: 94 mL/min/{1.73_m2} (ref >=60)
Globulin: 2.2 g/dL (calc) (ref 1.9–3.7)
Glucose, Bld: 86 mg/dL (ref 65–99)
Potassium: 4.6 mmol/L (ref 3.5–5.3)
Sodium: 141 mmol/L (ref 135–146)
TOTAL PROTEIN: 6.4 g/dL (ref 6.1–8.1)

## 2018-06-18 LAB — HEMOGLOBIN A1C
EAG (MMOL/L): 6 (calc)
Hgb A1c MFr Bld: 5.4 % of total Hgb (ref <5.7)
Mean Plasma Glucose: 108 (calc)

## 2018-06-18 LAB — FERRITIN: Ferritin: 30 ng/mL (ref 16–232)

## 2018-08-23 ENCOUNTER — Ambulatory Visit: Payer: Self-pay

## 2018-08-23 NOTE — Telephone Encounter (Signed)
Copied from [place] [phone]. Topic: Referral - Request for Referral >> Aug 23, 2018  2:22 PM Margot Ables wrote: Has patient seen PCP for this complaint? Yes - ongoing *If NO, is insurance requiring patient see PCP for this issue before PCP can refer them? Yes - BCBS Referral for which specialty: Psychiatry Preferred provider/office: [place] - pt is out of her medication and was advised Dr. Ancil Boozer needs to send new referral as pt has not been seen in over a year. Reason for referral: Psychiatrist in [place], [place]  Address: [address], [place], [place] [address] Phone: [phone]

## 2018-08-23 NOTE — Telephone Encounter (Signed)
Pt. Reports she started having pain to left arm in December after her flu vaccine.Reports she has weakness/tingling in arm at intervals. States it hurts from her shoulder down to her pinky finger. Fingers are normal color. Does not remember any injury to left arm. Has tried OTC pain reliever - does not help.Appointment made for tomorrow.   Reason for Disposition . Numbness (i.e., loss of sensation) in hand or fingers  Answer Assessment - Initial Assessment Questions 1. ONSET: "When did the pain start?"     December after her flu vaccine 2. LOCATION: "Where is the pain located?"     Shoulder down into the elbow down to pinky finger 3. PAIN: "How bad is the pain?" (Scale 1-10; or mild, moderate, severe)   - MILD (1-3): doesn't interfere with normal activities   - MODERATE (4-7): interferes with normal activities (e.g., work or school) or awakens from sleep   - SEVERE (8-10): excruciating pain, unable to do any normal activities, unable to hold a cup of water     More than 10 4. WORK OR EXERCISE: "Has there been any recent work or exercise that involved this part of the body?"     No 5. CAUSE: "What do you think is causing the arm pain?"     Started after the flu vaccine 6. OTHER SYMPTOMS: "Do you have any other symptoms?" (e.g., neck pain, swelling, rash, fever, numbness, weakness)     Weakness to left arm, pain, numbness at times 7. PREGNANCY: "Is there any chance you are pregnant?" "When was your last menstrual period?"     No  Protocols used: ARM PAIN-A-AH

## 2018-08-24 ENCOUNTER — Ambulatory Visit: Payer: BLUE CROSS/BLUE SHIELD | Admitting: Nurse Practitioner

## 2018-08-24 ENCOUNTER — Other Ambulatory Visit: Payer: Self-pay | Admitting: Family Medicine

## 2018-08-24 ENCOUNTER — Encounter: Payer: Self-pay | Admitting: Nurse Practitioner

## 2018-08-24 VITALS — BP 136/82 | HR 100 | Temp 98.0°F | Resp 16 | Ht 65.0 in | Wt 208.4 lb

## 2018-08-24 DIAGNOSIS — J302 Other seasonal allergic rhinitis: Secondary | ICD-10-CM

## 2018-08-24 DIAGNOSIS — F331 Major depressive disorder, recurrent, moderate: Secondary | ICD-10-CM

## 2018-08-24 DIAGNOSIS — M25512 Pain in left shoulder: Secondary | ICD-10-CM | POA: Diagnosis not present

## 2018-08-24 DIAGNOSIS — R202 Paresthesia of skin: Secondary | ICD-10-CM | POA: Diagnosis not present

## 2018-08-24 MED ORDER — MELOXICAM 7.5 MG PO TABS: 7.5000 mg | ORAL_TABLET | Freq: Every day | ORAL | 1 refills | Status: DC

## 2018-08-24 MED ORDER — LORATADINE 10 MG PO TABS: 10.0000 mg | ORAL_TABLET | Freq: Every day | ORAL | 3 refills | Status: DC

## 2018-08-24 MED ORDER — TIZANIDINE HCL 2 MG PO CAPS: 2.0000 mg | ORAL_CAPSULE | Freq: Every day | ORAL | 0 refills | Status: DC

## 2018-08-24 MED ORDER — VENLAFAXINE HCL ER 150 MG PO CP24: 150.0000 mg | ORAL_CAPSULE | Freq: Every day | ORAL | 1 refills | Status: DC

## 2018-08-24 NOTE — Progress Notes (Signed)
Name: Kelly [name]   MRN: [id number]    DOB: 03-30-1966   Date:08/24/2018       Progress Note  Subjective  Chief Complaint  Chief Complaint  Patient presents with  . Arm Pain    patient has been having issues with left arm since her flu shot in December. pain radiates down to pinky. numbness, tingling, limited ROM, hard to lift or grip things. has tried ice and heat.   . Medication Refill    loratadine & effexor    HPI  Arm pain Endorses left arm pain around mid December, states around the time she got her flu shot but does not know if it is directly related. Patient endorses left shoulder pain that shoots down mid arm and down to pinky. States pain was intermittent but has become more constant and has pins and needles sensation. Patient has been taking aleve on the days it hurts the worse. Also using ice and heat.   Allergies Takes loratadine daily for allergies has itchy eyes and rhinorrhea.   Depression Patient needs new referral for psychiatry. Takes effexor 150 once a day feels this works well for her.    Depression screen [facility] 2/9 08/24/2018 06/17/2018 03/09/2018 01/31/2018 08/04/2017  Decreased Interest 1 3 1 3  0  Down, Depressed, Hopeless 2 2 2 3  0  PHQ - 2 Score 3 5 3 6  0  Altered sleeping 2 3 1 3  -  Tired, decreased energy 1 2 1 3  -  Change in appetite 2 2 2 2  -  Feeling bad or failure about yourself  1 2 2 3  -  Trouble concentrating 1 1 2 1  -  Moving slowly or fidgety/restless 0 1 1 1  -  Suicidal thoughts 0 0 0 1 -  PHQ-9 Score 10 16 12 20  -  Difficult doing work/chores Somewhat difficult Very difficult Somewhat difficult Very difficult -  Some recent data might be hidden     Patient Active Problem List   Diagnosis Date Noted  . Morbid obesity ([place]) 07/26/2017  . Spondylolisthesis of lumbosacral region 02/11/2016  . Hematuria 04/09/2015  . Hyperglycemia 04/06/2015  . Right lumbar radiculitis 12/28/2014  . Moderate recurrent major depression ([place]) 12/27/2014   . Gastro-esophageal reflux disease without esophagitis 12/27/2014  . Bulge of lumbar disc without myelopathy 12/27/2014  . Dysmetabolic syndrome 00/86/7619  . Migraine without aura and responsive to treatment 12/27/2014  . Osteoarthrosis 12/27/2014  . Obesity (BMI 30-39.9) 12/27/2014  . Restless leg 12/27/2014  . Allergic rhinitis, seasonal 12/27/2014    Past Medical History:  Diagnosis Date  . Allergy   . Anxiety   . Depression   . GERD (gastroesophageal reflux disease)   . History of kidney stones   . Insomnia   . Intermittent low back pain   . Migraines   . Osteoarthritis   . Recurrent UTI   . Restless leg syndrome   . Sciatica of right side   . Symptomatic menopausal or female climacteric states   . Vertigo   . Vitamin D deficiency     Past Surgical History:  Procedure Laterality Date  . BACK SURGERY    . DIAGNOSTIC LAPAROSCOPY    . DILATION AND CURETTAGE OF UTERUS    . KNEE ARTHROSCOPY WITH MEDIAL MENISECTOMY Left 01/14/2017   Procedure: KNEE ARTHROSCOPY WITH MEDIAL AND LATERAL MENISECTOMY;  Surgeon: Hessie Knows, MD;  Location: [facility] ORS;  Service: Orthopedics;  Laterality: Left;  Partial Knee menisectomy  . KNEE SURGERY Left 05/2011  Dr. Rudene Christians- Arthroscopic  . LUMBAR LAMINECTOMY/DECOMPRESSION MICRODISCECTOMY N/A 09/16/2016   Procedure: LUMBAR LAMINECTOMY/DECOMPRESSION MICRODISCECTOMY 1 LEVEL L5-S1;  Surgeon: Meade Maw, MD;  Location: [facility] ORS;  Service: Neurosurgery;  Laterality: N/A;  . TONSILLECTOMY AND ADENOIDECTOMY    . URETHRAL STRICTURE DILATATION      Social History   Tobacco Use  . Smoking status: Former Smoker    Packs/day: 0.75    Years: 2.00    Pack years: 1.50    Types: Cigarettes    Start date: 07/06/1981    Last attempt to quit: 07/07/1983    Years since quitting: 35.1  . Smokeless tobacco: Never Used  Substance Use Topics  . Alcohol use: No    Alcohol/week: 0.0 standard drinks     Current Outpatient Medications:  .  hydrOXYzine  (ATARAX/VISTARIL) 25 MG tablet, Take 1 tablet (25 mg total) by mouth 2 (two) times daily as needed for anxiety., Disp: 60 tablet, Rfl: 0 .  naproxen sodium (ALEVE) 220 MG tablet, Take 220 mg by mouth., Disp: , Rfl:  .  omeprazole (PRILOSEC) 40 MG capsule, Take 1 capsule (40 mg total) by mouth every morning., Disp: 90 capsule, Rfl: 1 .  rOPINIRole (REQUIP) 0.5 MG tablet, Take 1 tablet (0.5 mg total) by mouth at bedtime., Disp: 90 tablet, Rfl: 0 .  venlafaxine XR (EFFEXOR-XR) 150 MG 24 hr capsule, Take 1 capsule (150 mg total) by mouth daily with breakfast., Disp: 30 capsule, Rfl: 0 .  loratadine (CLARITIN) 10 MG tablet, Take 1 tablet (10 mg total) by mouth daily. (Patient not taking: Reported on 08/24/2018), Disp: 90 tablet, Rfl: 5  Allergies  Allergen Reactions  . Penicillins Anaphylaxis, Hives, Nausea And Vomiting and Swelling    Has patient had a PCN reaction causing immediate rash, facial/tongue/throat swelling, SOB or lightheadedness with hypotension: Yes Has patient had a PCN reaction causing severe rash involving mucus membranes or skin necrosis: Yes Has patient had a PCN reaction that required hospitalization No Has patient had a PCN reaction occurring within the last 10 years: Yes If all of the above answers are "NO", then may proceed with Cephalosporin use.   . Chlorhexidine Gluconate Itching and Rash  . Ciprofloxacin Hives  . Clindamycin/Lincomycin Hives  . Erythromycin Hives and Nausea And Vomiting  . Keflex [Cephalexin] Hives  . Lyrica [Pregabalin]     Dizziness, syncope  . Nitrofurantoin Monohyd Macro Hives and Nausea And Vomiting  . Other Hives  . Sulfa Antibiotics Hives and Nausea And Vomiting    Rapid heart rate  . Tetracyclines & Related Hives  . Adhesive [Tape] Rash  . Latex Rash  . Vancomycin Itching and Rash    ROS   No other specific complaints in a complete review of systems (except as listed in HPI above).  Objective  Vitals:   08/24/18 1224  BP: 136/82   Pulse: 100  Resp: 16  Temp: 98 F (36.7 C)  TempSrc: Oral  SpO2: 98%  Weight: 208 lb 6.4 oz (94.5 kg)  Height: 5\' 5"  (1.651 m)    Body mass index is 34.68 kg/m.  Nursing Note and Vital Signs reviewed.  Physical Exam Constitutional:      Appearance: Normal appearance. She is well-developed.  HENT:     Head: Normocephalic and atraumatic.     Right Ear: Hearing normal.     Left Ear: Hearing normal.  Eyes:     Conjunctiva/sclera: Conjunctivae normal.  Cardiovascular:     Rate and Rhythm: Normal rate and regular  rhythm.     Heart sounds: Normal heart sounds.  Pulmonary:     Effort: Pulmonary effort is normal.     Breath sounds: Normal breath sounds.  Musculoskeletal:     Left shoulder: She exhibits decreased range of motion, tenderness, pain and spasm. She exhibits no bony tenderness, no swelling, no effusion, no deformity and no laceration.  Skin:    General: Skin is warm and dry.     Findings: No erythema.  Neurological:     General: No focal deficit present.     Mental Status: She is alert and oriented to person, place, and time.     Motor: No weakness.  Psychiatric:        Speech: Speech normal.        Behavior: Behavior normal. Behavior is cooperative.        Thought Content: Thought content normal.        Judgment: Judgment normal.     No results found for this or any previous visit (from the past 48 hour(s)).  Assessment & Plan  1. Acute pain of left shoulder  Follow up next Tuesday if pain is not improved a little, I am ordering an x-ray of your shoulder. If your pain is not improved some by the weekend go to the imaging center across the street on Monday morning to complete the shoulder x-ray.  - meloxicam (MOBIC) 7.5 MG tablet; Take 1 tablet (7.5 mg total) by mouth daily.  Dispense: 30 tablet; Refill: 1 - Ambulatory referral to Physical Therapy - tizanidine (ZANAFLEX) 2 MG capsule; Take 1 capsule (2 mg total) by mouth at bedtime.  Dispense: 30 capsule;  Refill: 0 - DG Shoulder Left; Future  2. Arm paresthesia, left - Ambulatory referral to Physical Therapy - DG Shoulder Left; Future  3. Depression, major, recurrent, moderate ([place]) - Ambulatory referral to Psychiatry - venlafaxine XR (EFFEXOR-XR) 150 MG 24 hr capsule; Take 1 capsule (150 mg total) by mouth daily with breakfast.  Dispense: 90 capsule; Refill: 1  4. Seasonal allergic rhinitis, unspecified trigger - loratadine (CLARITIN) 10 MG tablet; Take 1 tablet (10 mg total) by mouth daily.  Dispense: 90 tablet; Refill: 3

## 2018-08-24 NOTE — Patient Instructions (Addendum)
-  Please take mobic 7.5 mg daily with meal for the next 3 days to help with pain can up to take 15mg  daily with foods.  - Take muscle relaxer at night time, do not drink alcohol with this - Will receive a phone call about physical therapy likely within the next 2 weeks - Follow up next Tuesday, I am ordering an x-ray of your shoulder. If your pain is not improved some by the weekend go to the imaging center across the street on Monday morning to complete the shoulder x-ray.

## 2018-08-30 ENCOUNTER — Ambulatory Visit
Admission: RE | Admit: 2018-08-30 | Discharge: 2018-08-30 | Disposition: A | Discharge status: Home / Self Care | Payer: BLUE CROSS/BLUE SHIELD | Attending: Nurse Practitioner | Admitting: Nurse Practitioner

## 2018-08-30 ENCOUNTER — Ambulatory Visit: Payer: BLUE CROSS/BLUE SHIELD | Admitting: Nurse Practitioner

## 2018-08-30 ENCOUNTER — Ambulatory Visit
Admission: RE | Admit: 2018-08-30 | Discharge: 2018-08-30 | Disposition: A | Discharge status: Home / Self Care | Payer: BLUE CROSS/BLUE SHIELD | Source: Ambulatory Visit | Attending: Nurse Practitioner | Admitting: Nurse Practitioner

## 2018-08-30 ENCOUNTER — Ambulatory Visit: Payer: BLUE CROSS/BLUE SHIELD

## 2018-08-30 ENCOUNTER — Encounter: Payer: Self-pay | Admitting: Nurse Practitioner

## 2018-08-30 VITALS — BP 124/76 | HR 90 | Temp 98.1°F | Resp 16 | Ht 65.0 in | Wt 207.7 lb

## 2018-08-30 DIAGNOSIS — R202 Paresthesia of skin: Secondary | ICD-10-CM | POA: Insufficient documentation

## 2018-08-30 DIAGNOSIS — M25512 Pain in left shoulder: Secondary | ICD-10-CM | POA: Insufficient documentation

## 2018-08-30 DIAGNOSIS — M19012 Primary osteoarthritis, left shoulder: Secondary | ICD-10-CM | POA: Diagnosis not present

## 2018-08-30 DIAGNOSIS — G2581 Restless legs syndrome: Secondary | ICD-10-CM | POA: Diagnosis not present

## 2018-08-30 MED ORDER — ROPINIROLE HCL 0.5 MG PO TABS: 1.0000 mg | ORAL_TABLET | Freq: Every day | ORAL | 0 refills | Status: DC

## 2018-08-30 NOTE — Progress Notes (Signed)
Name: CATY TESSLER   MRN: [id number]    DOB: 1965/08/10   Date:08/30/2018       Progress Note  Subjective  Chief Complaint  Chief Complaint  Patient presents with  . Follow-up    no improvements  . Restless Leg    possible med change    HPI  Patient presents for one week follow-up on shoulder pain has been taking meloxicam 15mg  daily the last few days. States was having some mild improvement but worsened when she used her left shoulder to help push her up- caused frontal shoulder pain that shoots backwards, states having some weakness when she lifts up certain objects. Denies chest pain or jaw pain.   RLS: states has unpleasant "creepy crawly" sensation has increased and having intermittent leg pain. It is worse with inactivity, improved with walking or kicking the sheets. States recently cannot get good sleep. States has taken 1mg  of the requip occasionally with more improvement.  Is using therafoam spray.    Patient Active Problem List   Diagnosis Date Noted  . Morbid obesity ([place]) 07/26/2017  . Spondylolisthesis of lumbosacral region 02/11/2016  . Hematuria 04/09/2015  . Hyperglycemia 04/06/2015  . Right lumbar radiculitis 12/28/2014  . Moderate recurrent major depression ([place]) 12/27/2014  . Gastro-esophageal reflux disease without esophagitis 12/27/2014  . Bulge of lumbar disc without myelopathy 12/27/2014  . Dysmetabolic syndrome 73/22/0254  . Migraine without aura and responsive to treatment 12/27/2014  . Osteoarthrosis 12/27/2014  . Obesity (BMI 30-39.9) 12/27/2014  . Restless leg 12/27/2014  . Allergic rhinitis, seasonal 12/27/2014    Past Medical History:  Diagnosis Date  . Allergy   . Anxiety   . Depression   . GERD (gastroesophageal reflux disease)   . History of kidney stones   . Insomnia   . Intermittent low back pain   . Migraines   . Osteoarthritis   . Recurrent UTI   . Restless leg syndrome   . Sciatica of right side   . Symptomatic menopausal  or female climacteric states   . Vertigo   . Vitamin D deficiency     Past Surgical History:  Procedure Laterality Date  . BACK SURGERY    . DIAGNOSTIC LAPAROSCOPY    . DILATION AND CURETTAGE OF UTERUS    . KNEE ARTHROSCOPY WITH MEDIAL MENISECTOMY Left 01/14/2017   Procedure: KNEE ARTHROSCOPY WITH MEDIAL AND LATERAL MENISECTOMY;  Surgeon: Hessie Knows, MD;  Location: [facility] ORS;  Service: Orthopedics;  Laterality: Left;  Partial Knee menisectomy  . KNEE SURGERY Left 05/2011   Dr. Rudene Christians- Arthroscopic  . LUMBAR LAMINECTOMY/DECOMPRESSION MICRODISCECTOMY N/A 09/16/2016   Procedure: LUMBAR LAMINECTOMY/DECOMPRESSION MICRODISCECTOMY 1 LEVEL L5-S1;  Surgeon: Meade Maw, MD;  Location: [facility] ORS;  Service: Neurosurgery;  Laterality: N/A;  . TONSILLECTOMY AND ADENOIDECTOMY    . URETHRAL STRICTURE DILATATION      Social History   Tobacco Use  . Smoking status: Former Smoker    Packs/day: 0.75    Years: 2.00    Pack years: 1.50    Types: Cigarettes    Start date: 07/06/1981    Last attempt to quit: 07/07/1983    Years since quitting: 35.1  . Smokeless tobacco: Never Used  Substance Use Topics  . Alcohol use: No    Alcohol/week: 0.0 standard drinks     Current Outpatient Medications:  .  hydrOXYzine (ATARAX/VISTARIL) 25 MG tablet, Take 1 tablet (25 mg total) by mouth 2 (two) times daily as needed for anxiety., Disp: 60 tablet,  Rfl: 0 .  loratadine (CLARITIN) 10 MG tablet, Take 1 tablet (10 mg total) by mouth daily., Disp: 90 tablet, Rfl: 3 .  meloxicam (MOBIC) 7.5 MG tablet, Take 1 tablet (7.5 mg total) by mouth daily., Disp: 30 tablet, Rfl: 1 .  omeprazole (PRILOSEC) 40 MG capsule, Take 1 capsule (40 mg total) by mouth every morning., Disp: 90 capsule, Rfl: 1 .  rOPINIRole (REQUIP) 0.5 MG tablet, Take 2 tablets (1 mg total) by mouth at bedtime., Disp: 90 tablet, Rfl: 0 .  tizanidine (ZANAFLEX) 2 MG capsule, Take 1 capsule (2 mg total) by mouth at bedtime., Disp: 30 capsule, Rfl: 0 .   venlafaxine XR (EFFEXOR-XR) 150 MG 24 hr capsule, Take 1 capsule (150 mg total) by mouth daily with breakfast., Disp: 90 capsule, Rfl: 1  Allergies  Allergen Reactions  . Penicillins Anaphylaxis, Hives, Nausea And Vomiting and Swelling    Has patient had a PCN reaction causing immediate rash, facial/tongue/throat swelling, SOB or lightheadedness with hypotension: Yes Has patient had a PCN reaction causing severe rash involving mucus membranes or skin necrosis: Yes Has patient had a PCN reaction that required hospitalization No Has patient had a PCN reaction occurring within the last 10 years: Yes If all of the above answers are "NO", then may proceed with Cephalosporin use.   . Chlorhexidine Gluconate Itching and Rash  . Ciprofloxacin Hives  . Clindamycin/Lincomycin Hives  . Erythromycin Hives and Nausea And Vomiting  . Keflex [Cephalexin] Hives  . Lyrica [Pregabalin]     Dizziness, syncope  . Nitrofurantoin Monohyd Macro Hives and Nausea And Vomiting  . Other Hives  . Sulfa Antibiotics Hives and Nausea And Vomiting    Rapid heart rate  . Tetracyclines & Related Hives  . Adhesive [Tape] Rash  . Latex Rash  . Vancomycin Itching and Rash    ROS   No other specific complaints in a complete review of systems (except as listed in HPI above).  Objective  Vitals:   08/30/18 1133  BP: 124/76  Pulse: 90  Resp: 16  Temp: 98.1 F (36.7 C)  TempSrc: Oral  SpO2: 98%  Weight: 207 lb 11.2 oz (94.2 kg)  Height: 5\' 5"  (1.651 m)    Body mass index is 34.56 kg/m.  Nursing Note and Vital Signs reviewed.  Physical Exam Constitutional:      Appearance: Normal appearance.  HENT:     Head: Normocephalic and atraumatic.  Cardiovascular:     Rate and Rhythm: Normal rate.     Pulses: Normal pulses.  Pulmonary:     Effort: Pulmonary effort is normal.  Musculoskeletal:        General: No swelling or deformity.     Left shoulder: She exhibits decreased range of motion, tenderness,  pain and spasm. She exhibits no bony tenderness, no swelling and no deformity.  Neurological:     General: No focal deficit present.     Mental Status: She is alert and oriented to person, place, and time.  Psychiatric:        Mood and Affect: Mood normal.        Behavior: Behavior normal.       No results found for this or any previous visit (from the past 48 hour(s)).  Assessment & Plan 1. Acute pain of left shoulder Continue NSAID, rest, heat, acetaminophen, pick up muscle relaxer and complete xray. Discussed switching to steroid. High suspicion for rotator cuff injury.  - Ambulatory referral to Orthopedic Surgery  2. Restless  leg Increase to 1mg  nightly - rOPINIRole (REQUIP) 0.5 MG tablet; Take 2 tablets (1 mg total) by mouth at bedtime.  Dispense: 90 tablet; Refill: 0

## 2018-08-30 NOTE — Patient Instructions (Addendum)
- Please get xray across the street - You should receive a phone call about ortho referral within the next week - Continue taking meloxicam 15mg  with food, take tizanidine at night time. Use heat on the atea 20 minutes 4-5 times a day - Increase requip to 1mg  nightly, try to take it close to 3 hours before bedtime.    Rotator Cuff Tear  A rotator cuff tear is a partial or complete tear of the cord-like bands (tendons) that connect muscle to bone in the rotator cuff. The rotator cuff is a group of muscles and tendons that surround the shoulder joint and keep the upper arm bone (humerus) in the shoulder socket. The tear can occur suddenly (acute tear) or can develop over a long period of time (chronic tear). What are the causes? Acute tears may be caused by:  A fall, especially on an outstretched arm.  Lifting very heavy objects with a jerking motion. Chronic tears may be caused by overuse of the muscles. This may happen in sports, physical work, or activities in which your arm repeatedly moves over your head. What increases the risk? This condition is more likely to occur in:  Athletes and workers who frequently use their shoulder or reach over their heads. This may include activities such as: ? Tennis. ? Baseball and softball. ? Swimming and rowing. ? Weightlifting. ? Architect work. ? Painting.  People who smoke.  Older people who have arthritis or poor blood supply. These can make the muscles and tendons weaker. What are the signs or symptoms? Symptoms of this condition depend on the type and severity of the injury:  An acute tear may include a sudden tearing feeling, followed by severe pain that goes from your upper shoulder, down your arm, and toward your elbow.  A chronic tear includes a gradual weakness and decreased shoulder motion as the pain gets worse. The pain is usually worse at night. Both types may have symptoms such as:  Pain that spreads (radiates) from the  shoulder to the upper arm.  Swelling and tenderness in front of the shoulder.  Decreased range of motion.  Pain when: ? Reaching, pulling, or lifting the arm above the head. ? Lowering the arm from above the head.  Not being able to raise your arm out to the side.  Difficulty placing the arm behind your back. How is this diagnosed? This condition is diagnosed with a medical history and physical exam. Imaging tests may also be done, including:  X-rays.  MRI.  Ultrasound.  CT or MR arthrogram. During this test, a contrast material is injected into your shoulder and then images are taken. How is this treated? Treatment for this condition depends on the type and severity of the condition. In less severe cases, treatment may include:  Rest. This may be done with a sling that holds the shoulder still (immobilization). Your health care provider may also recommend avoiding activities that involve lifting your arm over your head.  Icing the shoulder.  Anti-inflammatory medicines, such as aspirin or ibuprofen.  Strengthening and stretching exercises. Your health care provider may recommend specific exercises to improve your range of motion and strengthen your shoulder. In more severe cases, treatment may include:  Physical therapy.  Steroid injections.  Surgery. Follow these instructions at home: Managing pain, stiffness, and swelling  If directed, put ice on the injured area. ? If you have a removable sling, remove it as told by your health care provider. ? Put ice in  a plastic bag. ? Place a towel between your skin and the bag. ? Leave the ice on for 20 minutes, 2-3 times a day.  Raise (elevate) the injured area above the level of your heart while you are lying down.  Find a comfortable sleeping position or sleep on a recliner, if available.  Move your fingers often to avoid stiffness and to lessen swelling.  Once the swelling has gone down, your health care provider  may direct you to apply heat to relax the muscles. Use the heat source that your health care provider recommends, such as a moist heat pack or a heating pad. ? Place a towel between your skin and the heat source. ? Leave the heat on for 20-30 minutes. ? Remove the heat if your skin turns bright red. This is especially important if you are unable to feel pain, heat, or cold. You may have a greater risk of getting burned. If you have a sling:  Wear the sling as told by your health care provider. Remove it only as told by your health care provider.  Loosen the sling if your fingers tingle, become numb, or turn cold and blue.  Keep the sling clean.  If the sling is not waterproof: ? Do not let it get wet. ? Cover it with a watertight covering when you take a bath or a shower. Driving  Do not drive or use heavy machinery while taking prescription pain medicine.  Ask your health care provider when it is safe to drive if you have a sling on your arm. Activity  Rest your shoulder as told by your health care provider.  Return to your normal activities as told by your health care provider. Ask your health care provider what activities are safe for you.  Do any exercises or stretches as told by your health care provider. General instructions  Do not use any products that contain nicotine or tobacco, such as cigarettes and e-cigarettes. If you need help quitting, ask your health care provider.  Take over-the-counter and prescription medicines only as told by your health care provider.  Keep all follow-up visits as told by your health care provider. This is important. Contact a health care provider if:  Your pain gets worse.  You have new pain in your arm, hands, or fingers.  Medicine does not help your pain. Get help right away if:  Your arm, hand, or fingers are numb or tingling.  Your arm, hand, or fingers are swollen or painful or they turn white or blue.  Your hand or fingers  on your injured arm are colder than your other hand. Summary  A rotator cuff tear is a partial or complete tear of the cord-like bands (tendons) that connect muscle to bone in the rotator cuff.  The tear can occur suddenly (acute tear) or can develop over a long period of time (chronic tear).  Treatment generally includes rest, anti-inflammatory medicines, and icing. In some cases, physical therapy and steroid injections may be needed. In severe cases, surgery may be needed. This information is not intended to replace advice given to you by your health care provider. Make sure you discuss any questions you have with your health care provider. Document Released: 06/19/2000 Document Revised: 09/07/2016 Document Reviewed: 09/07/2016 Elsevier Interactive Patient Education  2019 [employer].

## 2018-09-18 ENCOUNTER — Encounter: Payer: Self-pay | Admitting: Family Medicine

## 2018-09-20 ENCOUNTER — Encounter: Payer: Self-pay | Admitting: Family Medicine

## 2018-09-20 ENCOUNTER — Other Ambulatory Visit: Payer: Self-pay

## 2018-09-20 ENCOUNTER — Ambulatory Visit: Payer: BLUE CROSS/BLUE SHIELD | Admitting: Family Medicine

## 2018-09-20 VITALS — BP 122/68 | HR 116 | Temp 98.2°F | Resp 16 | Ht 65.0 in | Wt 205.6 lb

## 2018-09-20 DIAGNOSIS — M5441 Lumbago with sciatica, right side: Secondary | ICD-10-CM | POA: Diagnosis not present

## 2018-09-20 DIAGNOSIS — M7542 Impingement syndrome of left shoulder: Secondary | ICD-10-CM | POA: Diagnosis not present

## 2018-09-20 DIAGNOSIS — G2581 Restless legs syndrome: Secondary | ICD-10-CM

## 2018-09-20 DIAGNOSIS — E8881 Metabolic syndrome: Secondary | ICD-10-CM

## 2018-09-20 DIAGNOSIS — G8929 Other chronic pain: Secondary | ICD-10-CM

## 2018-09-20 DIAGNOSIS — G43009 Migraine without aura, not intractable, without status migrainosus: Secondary | ICD-10-CM

## 2018-09-20 DIAGNOSIS — F331 Major depressive disorder, recurrent, moderate: Secondary | ICD-10-CM

## 2018-09-20 DIAGNOSIS — M5442 Lumbago with sciatica, left side: Secondary | ICD-10-CM

## 2018-09-20 DIAGNOSIS — K219 Gastro-esophageal reflux disease without esophagitis: Secondary | ICD-10-CM

## 2018-09-20 MED ORDER — ROPINIROLE HCL 1 MG PO TABS: 1.0000 mg | ORAL_TABLET | Freq: Every day | ORAL | 0 refills | Status: DC

## 2018-09-20 NOTE — Progress Notes (Signed)
Name: NYLIAH NIERENBERG   MRN: [id number]    DOB: 28-Jun-1966   Date:09/20/2018       Progress Note  Subjective  Chief Complaint  Chief Complaint  Patient presents with  . Medication Refill    3 month F/U  . Depression  . Migraine  . Obesity  . Night Sweats  . Gastroesophageal Reflux  . RLS    HPI  Major Depression: shewas seeingDr. Barnetta Hammersmith was doing better,hsuband is back to work, she is taking medication, seeing therapist. She has been having nightmare that her adopted children will come after her.   Migraine Headaches: She states migraine not as frequent, stopped topamax .She states migraine is pain is usually pressure like on temporal area and frontal area. Associated with nausea, phonophobia and photophobia, taking prn nsaid'sand usually needs to take a nap. Sometimes only taking caffeine and it resolves    Morbid obesity/Metabolic syndrome:shelost a few more pounds since last visit, she denies polyphagia, polydipsia or polyuria  Last hgbA1C was  5.4%   Night sweats:she states while on clonidine she felt very tired and stopped medication. She has irregular cycles also   GERD: under control with omeprazole at this time.Stable   RLS: she is now taking Requip 1 mg at night and it has helped with symptoms. She would like the higher dose rx.   Morbid obesity: based on BMI above 35 and co-morbidities: metabolic syndrome, back pain, GERD. Discussed importance of life style modification.   Left shoulder pain: anterior, present Dec after flu shot, but worse over the past month, seen by Suezanne Cheshire and was given nsaid's but continues to have pain, radiates to left arm, and sometimes causes numbness around her left elbow and down to left 5th finger. She feels like left arm is not as strong, pain worse with abduction and internal rotation of left shoulder. No problems with nect pain or decrease in rom. She was referred for PT but it was cancelled by Benjamine Mola  because she was worried about a tear. She is willing to see Ortho now    Patient Active Problem List   Diagnosis Date Noted  . Morbid obesity ([place]) 07/26/2017  . Spondylolisthesis of lumbosacral region 02/11/2016  . Hematuria 04/09/2015  . Hyperglycemia 04/06/2015  . Right lumbar radiculitis 12/28/2014  . Moderate recurrent major depression ([place]) 12/27/2014  . Gastro-esophageal reflux disease without esophagitis 12/27/2014  . Bulge of lumbar disc without myelopathy 12/27/2014  . Dysmetabolic syndrome 40/98/1191  . Migraine without aura and responsive to treatment 12/27/2014  . Osteoarthrosis 12/27/2014  . Obesity (BMI 30-39.9) 12/27/2014  . Restless leg 12/27/2014  . Allergic rhinitis, seasonal 12/27/2014    Past Surgical History:  Procedure Laterality Date  . BACK SURGERY    . DIAGNOSTIC LAPAROSCOPY    . DILATION AND CURETTAGE OF UTERUS    . KNEE ARTHROSCOPY WITH MEDIAL MENISECTOMY Left 01/14/2017   Procedure: KNEE ARTHROSCOPY WITH MEDIAL AND LATERAL MENISECTOMY;  Surgeon: Hessie Knows, MD;  Location: [facility] ORS;  Service: Orthopedics;  Laterality: Left;  Partial Knee menisectomy  . KNEE SURGERY Left 05/2011   Dr. Rudene Christians- Arthroscopic  . LUMBAR LAMINECTOMY/DECOMPRESSION MICRODISCECTOMY N/A 09/16/2016   Procedure: LUMBAR LAMINECTOMY/DECOMPRESSION MICRODISCECTOMY 1 LEVEL L5-S1;  Surgeon: Meade Maw, MD;  Location: [facility] ORS;  Service: Neurosurgery;  Laterality: N/A;  . TONSILLECTOMY AND ADENOIDECTOMY    . URETHRAL STRICTURE DILATATION      Family History  Problem Relation Age of Onset  . Cancer Paternal Grandmother  Social History   Socioeconomic History  . Marital status: Married    Spouse name: Jenny Reichmann  . Number of children: 2  . Years of education: 34  . Highest education level: Associate degree: occupational, Hotel manager, or vocational program  Occupational History  . 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 Financial resource strain: Hard  . Food insecurity:     Worry: Often true    Inability: Often true  . Transportation needs:    Medical: No    Non-medical: Yes  Tobacco Use  . Smoking status: Former Smoker    Packs/day: 0.75    Years: 2.00    Pack years: 1.50    Types: Cigarettes    Start date: 07/06/1981    Last attempt to quit: 07/07/1983    Years since quitting: 35.2  . Smokeless tobacco: Never Used  Substance and Sexual Activity  . Alcohol use: No    Alcohol/week: 0.0 standard drinks  . Drug use: No  . Sexual activity: Yes    Partners: Male  Lifestyle  . Physical activity:    Days per week: 0 days    Minutes per session: 0 min  . Stress: To some extent  Relationships  . Social connections:    Talks on phone: More than three times a week    Gets together: Twice a week    Attends religious service: Never    Active member of club or organization: No    Attends meetings of clubs or organizations: Never    Relationship status: Married  . Intimate partner violence:    Fear of current or ex partner: No    Emotionally abused: No    Physically abused: No    Forced sexual activity: No  Other Topics Concern  . Not on file  Social History Narrative   Married and husband has a lot of medical problems, he has a job       Current Outpatient Medications:  .  hydrOXYzine (ATARAX/VISTARIL) 25 MG tablet, Take 1 tablet (25 mg total) by mouth 2 (two) times daily as needed for anxiety., Disp: 60 tablet, Rfl: 0 .  loratadine (CLARITIN) 10 MG tablet, Take 1 tablet (10 mg total) by mouth daily., Disp: 90 tablet, Rfl: 3 .  omeprazole (PRILOSEC) 40 MG capsule, Take 1 capsule (40 mg total) by mouth every morning., Disp: 90 capsule, Rfl: 1 .  rOPINIRole (REQUIP) 1 MG tablet, Take 1 tablet (1 mg total) by mouth at bedtime., Disp: 90 tablet, Rfl: 0 .  tizanidine (ZANAFLEX) 2 MG capsule, Take 1 capsule (2 mg total) by mouth at bedtime., Disp: 30 capsule, Rfl: 0 .  venlafaxine XR (EFFEXOR-XR) 150 MG 24 hr capsule, Take 1 capsule (150 mg total) by  mouth daily with breakfast., Disp: 90 capsule, Rfl: 1  Allergies  Allergen Reactions  . Penicillins Anaphylaxis, Hives, Nausea And Vomiting and Swelling    Has patient had a PCN reaction causing immediate rash, facial/tongue/throat swelling, SOB or lightheadedness with hypotension: Yes Has patient had a PCN reaction causing severe rash involving mucus membranes or skin necrosis: Yes Has patient had a PCN reaction that required hospitalization No Has patient had a PCN reaction occurring within the last 10 years: Yes If all of the above answers are "NO", then may proceed with Cephalosporin use.   . Chlorhexidine Gluconate Itching and Rash  . Ciprofloxacin Hives  . Clindamycin/Lincomycin Hives  . Erythromycin Hives and Nausea And Vomiting  . Keflex [Cephalexin] Hives  . Lyrica [Pregabalin]  Dizziness, syncope  . Nitrofurantoin Monohyd Macro Hives and Nausea And Vomiting  . Other Hives  . Sulfa Antibiotics Hives and Nausea And Vomiting    Rapid heart rate  . Tetracyclines & Related Hives  . Adhesive [Tape] Rash  . Latex Rash  . Vancomycin Itching and Rash    I personally reviewed active problem list, medication list, allergies, family history, social history with the patient/caregiver today.   ROS  Constitutional: Negative for fever or weight change.  Respiratory: Negative for cough and shortness of breath.   Cardiovascular: Negative for chest pain or palpitations.  Gastrointestinal: Negative for abdominal pain, no bowel changes.  Musculoskeletal: Negative for gait problem or joint swelling.  Skin: Negative for rash.  Neurological: Negative for dizziness , positive for intermittent  headache.  No other specific complaints in a complete review of systems (except as listed in HPI above).  Objective  Vitals:   09/20/18 0934  BP: 122/68  Pulse: (!) 116  Resp: 16  Temp: 98.2 F (36.8 C)  TempSrc: Oral  SpO2: 99%  Weight: 205 lb 9.6 oz (93.3 kg)  Height: 5\' 5"  (1.651 m)     Body mass index is 34.21 kg/m.  Physical Exam  Constitutional: Patient appears well-developed and well-nourished. Obese  No distress.  HEENT: head atraumatic, normocephalic, pupils equal and reactive to light,  neck supple, throat within normal limits Cardiovascular: Normal rate, regular rhythm and normal heart sounds.  No murmur heard. No BLE edema. Pulmonary/Chest: Effort normal and breath sounds normal. No respiratory distress Muscular Skeletal: pain with abduction and internal rotation, normal grip positive impingement sign Abdominal: Soft.  There is no tenderness. Psychiatric: Patient has a normal mood and affect. behavior is normal. Judgment and thought content normal.  PHQ2/9: Depression screen [facility] 2/9 09/20/2018 08/30/2018 08/24/2018 06/17/2018 03/09/2018  Decreased Interest 2 1 1 3 1   Down, Depressed, Hopeless 1 2 2 2 2   PHQ - 2 Score 3 3 3 5 3   Altered sleeping 3 2 2 3 1   Tired, decreased energy 2 1 1 2 1   Change in appetite 1 2 2 2 2   Feeling bad or failure about yourself  0 1 1 2 2   Trouble concentrating 2 1 1 1 2   Moving slowly or fidgety/restless 1 0 0 1 1  Suicidal thoughts 0 0 0 0 0  PHQ-9 Score 12 10 10 16 12   Difficult doing work/chores Very difficult Not difficult at all Somewhat difficult Very difficult Somewhat difficult  Some recent data might be hidden   phq 9 positive   Fall Risk: Fall Risk  08/30/2018 08/24/2018 06/17/2018 03/09/2018 08/04/2017  Falls in the past year? 1 1 1  No No  Number falls in past yr: 1 1 1  - -  Comment - - - - -  Injury with Fall? 1 1 1  - -  Comment - - Twisted Left Knee and fell on Left Hip - -  Risk for fall due to : History of fall(s);Impaired balance/gait History of fall(s);Impaired balance/gait Impaired balance/gait;History of fall(s) - -  Risk for fall due to: Comment - - Left Knee and Right Hip are weak - -  Follow up - - - - -    Assessment & Plan  1. Impingement syndrome of left shoulder  - Ambulatory referral to  Orthopedic Surgery  2. Morbid obesity ([place])  Discussed with the patient the risk posed by an increased BMI. Discussed importance of portion control, calorie counting and at least 150 minutes  of physical activity weekly. Avoid sweet beverages and drink more water. Eat at least 6 servings of fruit and vegetables daily   3. Chronic low back pain with bilateral sciatica, unspecified back pain laterality  Seen by Dr. Holley Raring in the past ,she continues to have pain, thinking about going back to see him   4. Dysmetabolic syndrome  Last P7T was normal  5. Gastro-esophageal reflux disease without esophagitis  Taking omeprazole   6. Migraine without aura and responsive to treatment  Very seldom now and takes otc medication prn   7. Moderate recurrent major depression ([place])  She has follow up with Dr. Einar Grad coming up in April   8. Restless leg  She states higher dose helped  - rOPINIRole (REQUIP) 1 MG tablet; Take 1 tablet (1 mg total) by mouth at bedtime.  Dispense: 90 tablet; Refill: 0

## 2018-09-22 DIAGNOSIS — M7542 Impingement syndrome of left shoulder: Secondary | ICD-10-CM | POA: Diagnosis not present

## 2018-09-22 DIAGNOSIS — M542 Cervicalgia: Secondary | ICD-10-CM | POA: Diagnosis not present

## 2018-10-01 ENCOUNTER — Other Ambulatory Visit: Payer: Self-pay | Admitting: Nurse Practitioner

## 2018-10-01 DIAGNOSIS — G2581 Restless legs syndrome: Secondary | ICD-10-CM

## 2018-10-03 ENCOUNTER — Other Ambulatory Visit: Payer: Self-pay | Admitting: Family Medicine

## 2018-10-03 ENCOUNTER — Encounter: Payer: Self-pay | Admitting: Family Medicine

## 2018-10-03 DIAGNOSIS — M25512 Pain in left shoulder: Secondary | ICD-10-CM

## 2018-10-03 DIAGNOSIS — G2581 Restless legs syndrome: Secondary | ICD-10-CM

## 2018-10-03 DIAGNOSIS — F411 Generalized anxiety disorder: Secondary | ICD-10-CM

## 2018-10-03 MED ORDER — HYDROXYZINE HCL 25 MG PO TABS: 25.0000 mg | ORAL_TABLET | Freq: Two times a day (BID) | ORAL | 0 refills | Status: DC | PRN

## 2018-10-03 MED ORDER — TIZANIDINE HCL 2 MG PO CAPS: 2.0000 mg | ORAL_CAPSULE | Freq: Every day | ORAL | 0 refills | Status: DC

## 2018-10-03 MED ORDER — ROPINIROLE HCL 1 MG PO TABS: 1.0000 mg | ORAL_TABLET | Freq: Every day | ORAL | 0 refills | Status: DC

## 2018-10-03 NOTE — Telephone Encounter (Signed)
Refill request for general medication. Requip and Hydroxyzine to [facility].  Last office visit 09/20/2018   Follow up on 01/12/2019

## 2018-10-25 ENCOUNTER — Other Ambulatory Visit: Payer: Self-pay

## 2018-10-25 ENCOUNTER — Ambulatory Visit (INDEPENDENT_AMBULATORY_CARE_PROVIDER_SITE_OTHER): Payer: Self-pay | Admitting: Psychiatry

## 2018-10-25 ENCOUNTER — Encounter: Payer: Self-pay | Admitting: Psychiatry

## 2018-10-25 DIAGNOSIS — F410 Panic disorder [episodic paroxysmal anxiety] without agoraphobia: Secondary | ICD-10-CM

## 2018-10-25 DIAGNOSIS — F411 Generalized anxiety disorder: Secondary | ICD-10-CM

## 2018-10-25 DIAGNOSIS — F5105 Insomnia due to other mental disorder: Secondary | ICD-10-CM

## 2018-10-25 DIAGNOSIS — F331 Major depressive disorder, recurrent, moderate: Secondary | ICD-10-CM

## 2018-10-25 MED ORDER — VENLAFAXINE HCL ER 75 MG PO CP24: 75.0000 mg | ORAL_CAPSULE | Freq: Every day | ORAL | 1 refills | Status: DC

## 2018-10-25 MED ORDER — PRAZOSIN HCL 1 MG PO CAPS: 1.0000 mg | ORAL_CAPSULE | Freq: Every day | ORAL | 0 refills | Status: DC

## 2018-10-25 NOTE — Progress Notes (Signed)
TC on  10-25-18 @ 10:21  spoke with patient reviewed patient allergies with updated. Reviewed the medical and surgical hx with updates.  Pt medications and pharmacy were reviewed and updated. No vitals taken because this is a phone consult.
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
## 2018-11-16 ENCOUNTER — Ambulatory Visit (INDEPENDENT_AMBULATORY_CARE_PROVIDER_SITE_OTHER): Payer: Self-pay | Admitting: Psychiatry

## 2018-11-16 ENCOUNTER — Encounter: Payer: Self-pay | Admitting: Psychiatry

## 2018-11-16 ENCOUNTER — Other Ambulatory Visit: Payer: Self-pay

## 2018-11-16 DIAGNOSIS — F331 Major depressive disorder, recurrent, moderate: Secondary | ICD-10-CM

## 2018-11-16 DIAGNOSIS — F410 Panic disorder [episodic paroxysmal anxiety] without agoraphobia: Secondary | ICD-10-CM

## 2018-11-16 DIAGNOSIS — F5105 Insomnia due to other mental disorder: Secondary | ICD-10-CM

## 2018-11-16 DIAGNOSIS — F411 Generalized anxiety disorder: Secondary | ICD-10-CM

## 2018-11-16 NOTE — Progress Notes (Signed)
Virtual Visit via Video Note  I connected with Kelly [name] on 11/16/18 at 10:00 AM EDT by a video enabled telemedicine application and verified that I am speaking with the correct person using two identifiers.   I discussed the limitations of evaluation and management by telemedicine and the availability of in person appointments. The patient expressed understanding and agreed to proceed.   I discussed the assessment and treatment plan with the patient. The patient was provided an opportunity to ask questions and all were answered. The patient agreed with the plan and demonstrated an understanding of the instructions.   The patient was advised to call back or seek an in-person evaluation if the symptoms worsen or if the condition fails to improve as anticipated.  [place] MD OP Progress Note  11/16/2018 12:52 PM Kelly [name]  MRN:  [id number]  Chief Complaint:  Chief Complaint    Follow-up     WUX:[id number] is a 53 year old Caucasian female who is married, unemployed, lives in [place], has a history of depression, anxiety disorder, morbid obesity, gastroesophageal reflux disease, bulging lumbar disc, migraine headaches, was evaluated by telemedicine today.  Patient today reports she is tolerating the higher dosage of Effexor well.  She reports her mood symptoms are improving.  She feels more motivated.  She has picked up her hobbies again.  She started cross stitching and crocheting again.  She is happy about the same.  She also reports she has some new bunnies which were born in her home and has been spending a lot of time enjoying them.  She also has been babysitting her friend's granddaughter on and off.  That also helps her as a distraction.  She reports the prazosin has been very helpful.  She does not have nightmares anymore.  She is sleeping okay.  She does have some anxiety about the current COVID-19 situation however has been coping okay.  She continues to follow-up  with her therapist Ms. Marjie Skiff.  Patient denies any suicidality, homicidality or perceptual disturbances.  Patient denies any other concerns today. Visit Diagnosis:    ICD-10-CM   1. MDD (major depressive disorder), recurrent episode, moderate (HCC) F33.1   2. GAD (generalized anxiety disorder) F41.1   3. Panic disorder F41.0   4. Insomnia due to mental condition F51.05     Past Psychiatric History: Reviewed past psychiatric history from my progress note on 10/25/2018.  Trials of medications like Effexor, trazodone, hydroxyzine  Past Medical History:  Past Medical History:  Diagnosis Date  . Allergy   . Anxiety   . Depression   . GERD (gastroesophageal reflux disease)   . History of kidney stones   . Insomnia   . Intermittent low back pain   . Migraines   . Osteoarthritis   . Recurrent UTI   . Restless leg syndrome   . Sciatica of right side   . Symptomatic menopausal or female climacteric states   . Vertigo   . Vitamin D deficiency     Past Surgical History:  Procedure Laterality Date  . BACK SURGERY    . DIAGNOSTIC LAPAROSCOPY    . DILATION AND CURETTAGE OF UTERUS    . KNEE ARTHROSCOPY WITH MEDIAL MENISECTOMY Left 01/14/2017   Procedure: KNEE ARTHROSCOPY WITH MEDIAL AND LATERAL MENISECTOMY;  Surgeon: Hessie Knows, MD;  Location: [facility] ORS;  Service: Orthopedics;  Laterality: Left;  Partial Knee menisectomy  . KNEE SURGERY Left 05/2011   Dr. Rudene Christians- Arthroscopic  . LUMBAR LAMINECTOMY/DECOMPRESSION MICRODISCECTOMY N/A  09/16/2016   Procedure: LUMBAR LAMINECTOMY/DECOMPRESSION MICRODISCECTOMY 1 LEVEL L5-S1;  Surgeon: Meade Maw, MD;  Location: [facility] ORS;  Service: Neurosurgery;  Laterality: N/A;  . TONSILLECTOMY AND ADENOIDECTOMY    . URETHRAL STRICTURE DILATATION      Family Psychiatric History: Reviewed family psychiatric history from my progress note on 10/25/2018.  Family History:  Family History  Problem Relation Age of Onset  . Cancer Paternal  Grandmother   . Anxiety disorder Cousin   . Depression Cousin     Social History:  Social History   Socioeconomic History  . Marital status: Married    Spouse name: Jenny Reichmann  . Number of children: 2  . Years of education: 36  . Highest education level: Associate degree: occupational, Hotel manager, or vocational program  Occupational History  . 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 Financial resource strain: Hard  . Food insecurity:    Worry: Often true    Inability: Often true  . Transportation needs:    Medical: No    Non-medical: Yes  Tobacco Use  . Smoking status: Former Smoker    Packs/day: 0.75    Years: 2.00    Pack years: 1.50    Types: Cigarettes    Start date: 07/06/1981    Last attempt to quit: 07/07/1983    Years since quitting: 35.3  . Smokeless tobacco: Never Used  Substance and Sexual Activity  . Alcohol use: No    Alcohol/week: 0.0 standard drinks  . Drug use: No  . Sexual activity: Yes    Partners: Male  Lifestyle  . Physical activity:    Days per week: 0 days    Minutes per session: 0 min  . Stress: To some extent  Relationships  . Social connections:    Talks on phone: More than three times a week    Gets together: Twice a week    Attends religious service: Never    Active member of club or organization: No    Attends meetings of clubs or organizations: Never    Relationship status: Married  Other Topics Concern  . Not on file  Social History Narrative   Married and husband has a lot of medical problems, he has a job      Allergies:  Allergies  Allergen Reactions  . Penicillins Anaphylaxis, Hives, Nausea And Vomiting and Swelling    Has patient had a PCN reaction causing immediate rash, facial/tongue/throat swelling, SOB or lightheadedness with hypotension: Yes Has patient had a PCN reaction causing severe rash involving mucus membranes or skin necrosis: Yes Has patient had a PCN reaction that required hospitalization No Has patient had a  PCN reaction occurring within the last 10 years: Yes If all of the above answers are "NO", then may proceed with Cephalosporin use.   . Chlorhexidine Gluconate Itching and Rash  . Ciprofloxacin Hives  . Clindamycin/Lincomycin Hives  . Erythromycin Hives and Nausea And Vomiting  . Keflex [Cephalexin] Hives  . Lyrica [Pregabalin]     Dizziness, syncope  . Nitrofurantoin Monohyd Macro Hives and Nausea And Vomiting  . Other Hives  . Sulfa Antibiotics Hives and Nausea And Vomiting    Rapid heart rate  . Tetracyclines & Related Hives  . Adhesive [Tape] Rash  . Latex Rash  . Vancomycin Itching and Rash    Metabolic Disorder Labs: Lab Results  Component Value Date   HGBA1C 5.4 06/17/2018   MPG 108 06/17/2018   MPG 114 12/31/2016   No results found for:  PROLACTIN Lab Results  Component Value Date   CHOL 160 06/17/2018   TRIG 119 06/17/2018   HDL 56 06/17/2018   CHOLHDL 2.9 06/17/2018   VLDL 22 12/31/2016   LDLCALC 82 06/17/2018   LDLCALC 64 12/31/2016   Lab Results  Component Value Date   TSH 2.56 12/31/2016   TSH 2.80 02/27/2016    Therapeutic Level Labs: No results found for: LITHIUM No results found for: VALPROATE No components found for:  CBMZ  Current Medications: Current Outpatient Medications  Medication Sig Dispense Refill  . hydrOXYzine (ATARAX/VISTARIL) 25 MG tablet Take 1 tablet (25 mg total) by mouth 2 (two) times daily as needed for anxiety. 60 tablet 0  . loratadine (CLARITIN) 10 MG tablet Take 1 tablet (10 mg total) by mouth daily. 90 tablet 3  . omeprazole (PRILOSEC) 40 MG capsule Take 1 capsule (40 mg total) by mouth every morning. 90 capsule 1  . prazosin (MINIPRESS) 1 MG capsule Take 1 capsule (1 mg total) by mouth at bedtime. 90 capsule 0  . rOPINIRole (REQUIP) 1 MG tablet Take 1 tablet (1 mg total) by mouth at bedtime. 90 tablet 0  . tizanidine (ZANAFLEX) 2 MG capsule Take 1 capsule (2 mg total) by mouth at bedtime. 30 capsule 0  . venlafaxine XR  (EFFEXOR XR) 75 MG 24 hr capsule Take 1 capsule (75 mg total) by mouth daily with breakfast. To be added to 150 mg to make it 225 mg 90 capsule 1  . venlafaxine XR (EFFEXOR-XR) 150 MG 24 hr capsule Take 1 capsule (150 mg total) by mouth daily with breakfast. 90 capsule 1   No current facility-administered medications for this visit.      Musculoskeletal: Strength & Muscle Tone: UTA Gait & Station: UTA Patient leans: N/A  Psychiatric Specialty Exam: Review of Systems  Psychiatric/Behavioral: The patient is nervous/anxious.   All other systems reviewed and are negative.   There were no vitals taken for this visit.There is no height or weight on file to calculate BMI.  General Appearance: Casual  Eye Contact:  Fair  Speech:  Clear and Coherent  Volume:  Normal  Mood:  Anxious  Affect:  Congruent  Thought Process:  Goal Directed and Descriptions of Associations: Intact  Orientation:  Full (Time, Place, and Person)  Thought Content: Logical   Suicidal Thoughts:  No  Homicidal Thoughts:  No  Memory:  Immediate;   Fair Recent;   Fair Remote;   Fair  Judgement:  Fair  Insight:  Fair  Psychomotor Activity:  Normal  Concentration:  Concentration: Fair and Attention Span: Fair  Recall:  [employer] of Knowledge: Fair  Language: Fair  Akathisia:  No  Handed:  Right  AIMS (if indicated): Denies tremors, rigidity  Assets:  Communication Skills Desire for Improvement Housing Social Support  ADL's:  Intact  Cognition: WNL  Sleep:  Fair   Screenings: GAD-7     [facility] Visit from 09/20/2018 in [facility] Office Visit from 08/30/2018 in [facility] Office Visit from 08/24/2018 in [facility] Office Visit from [facility]/17/2018 in [facility] Office Visit from 03/09/2018 in [facility]  Total GAD-7 Score  11  10  10  5  9     PHQ2-9     Office Visit from 09/20/2018 in [facility] Office Visit from 08/30/2018 in [facility] Office Visit from 08/24/2018 in [facility] Office Visit  from 06/17/2018 in [facility] Office Visit from 03/09/2018 in [facility]  PHQ-2 Total Score  3  3  3  5  3   PHQ-9 Total Score  12  10  10  16  12        Assessment and Plan: Kelly [name] is a 53 year old Caucasian female, married, unemployed, lives in [place], has a history of MDD, GAD, panic disorder, insomnia, GERD, arthritis, chronic pain was evaluated by telemedicine today.  Patient is biologically predisposed given her family history, history of trauma and multiple medical problems.  Patient also has psychosocial stressors of financial problems and chronic pain.  Patient is currently making progress on the current medication regimen.  Plan as noted below.  Plan MDD-improving Effexor 225 mg p.o. daily. Continue psychotherapy sessions with therapist Marjie Skiff.  For GAD-improving Effexor as prescribed Continue CBT  For panic disorder-improving Effexor as prescribed   For insomnia-improving Prazosin 1 mg p.o. nightly for nightmares Melatonin 3 to 5 mg at bedtime as needed.  Patient was advised to sign a release to obtain medical records from therapist Kathryne Gin.  Follow-up in clinic in 1 month or sooner if needed.  Appointment scheduled for June 18 at 4:30 PM  I have spent atleast 15 minutes non face to face with patient today. More than 50 % of the time was spent for psychoeducation and supportive psychotherapy and care coordination.  This note was generated in part or whole with voice recognition software. Voice recognition is usually quite accurate but there are transcription errors that can and very often do occur. I apologize for any typographical errors that were not detected and corrected.          Ursula Alert, MD 11/16/2018, 12:52 PM

## 2018-11-23 IMAGING — CR DG KNEE COMPLETE 4+V*L*
1 series · 4 of 4 positions shown · non-contrast
Comparison: Left knee MRI 11/13/2016

CLINICAL DATA: Fall with left knee pain.  Initial encounter.

EXAM:
LEFT KNEE - COMPLETE 4+ VIEW

[Series 1: dg knee complete 4 views left · 0.14mm/px · 4 of 4 slices shown]
[im 1/4]
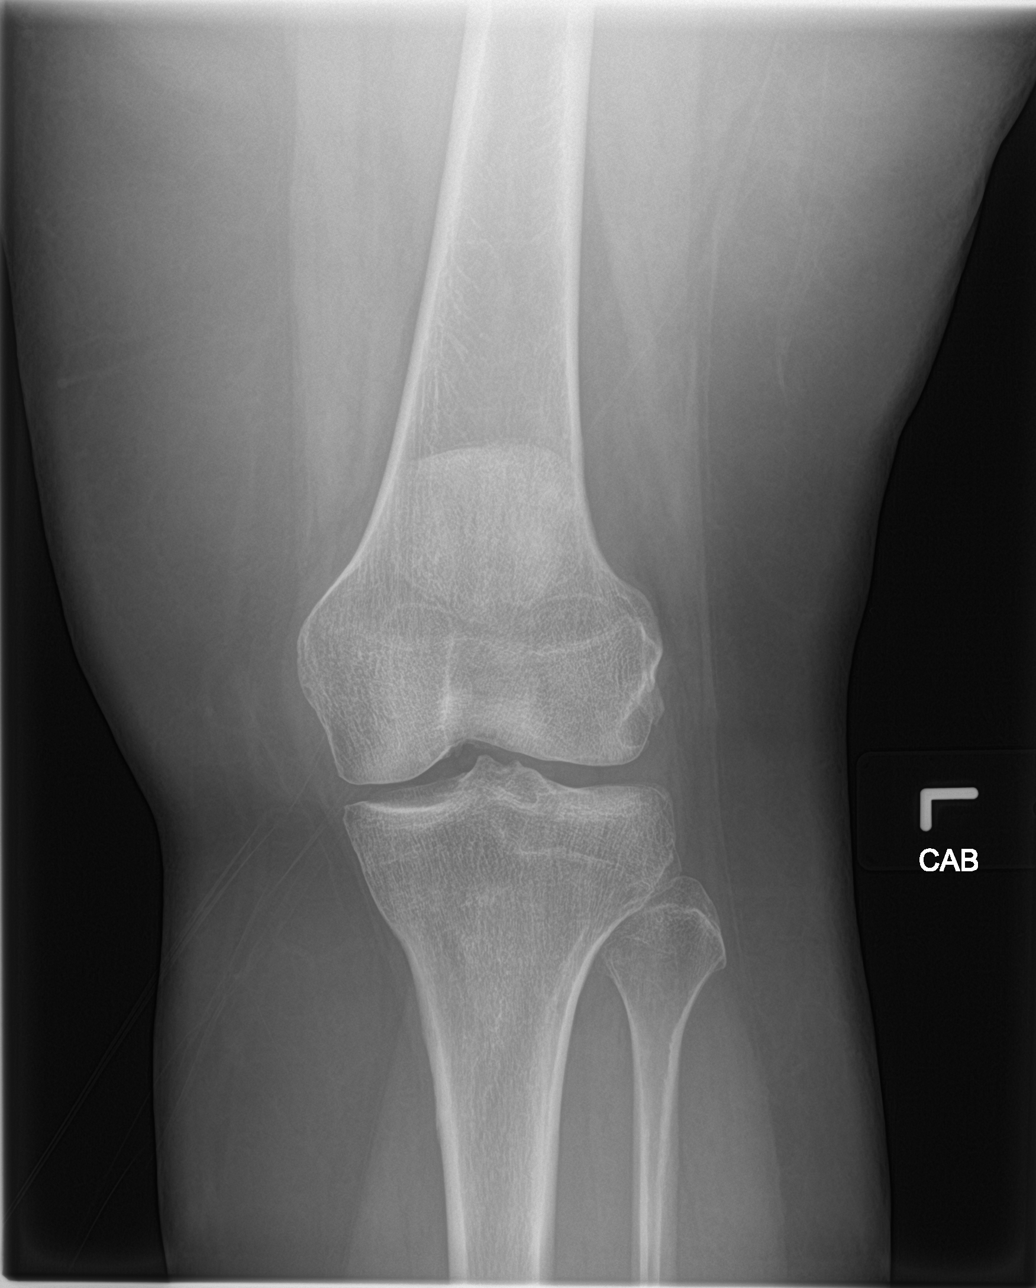
[im 2/4]
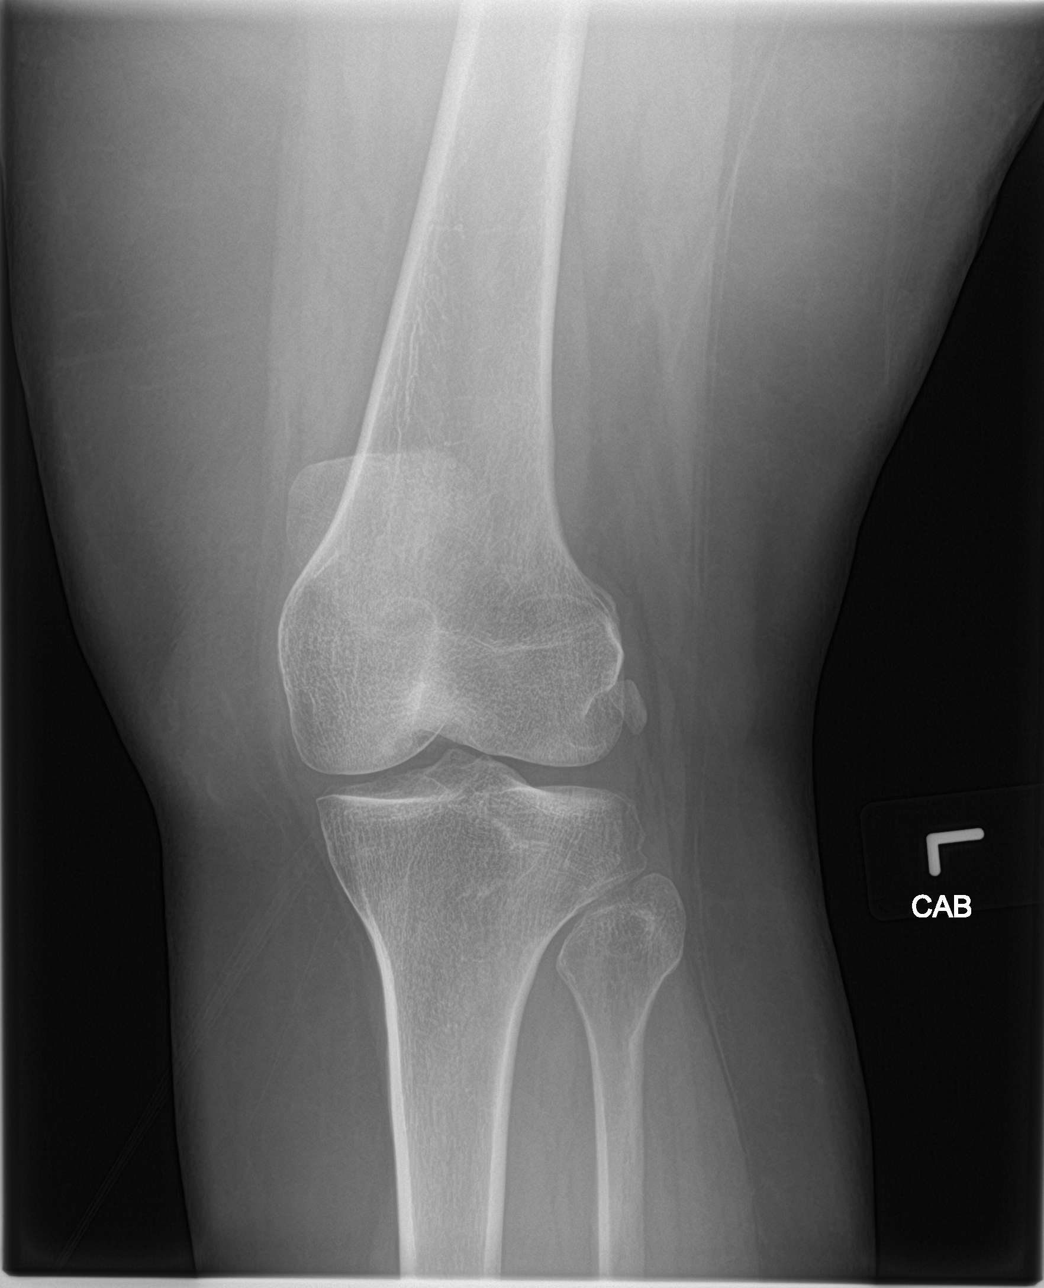
[im 3/4]
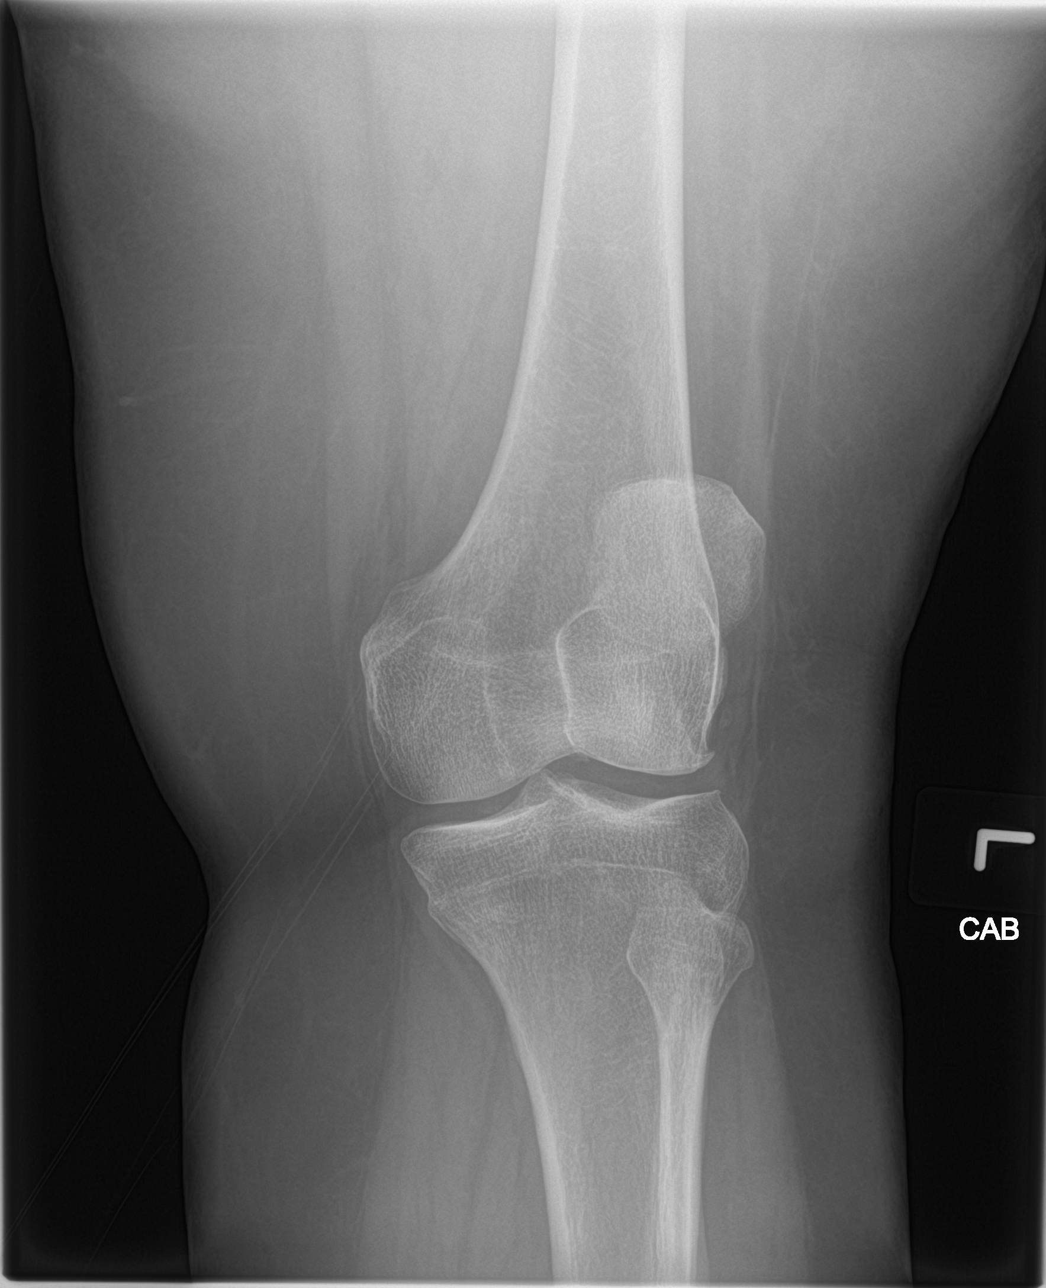
[im 4/4]
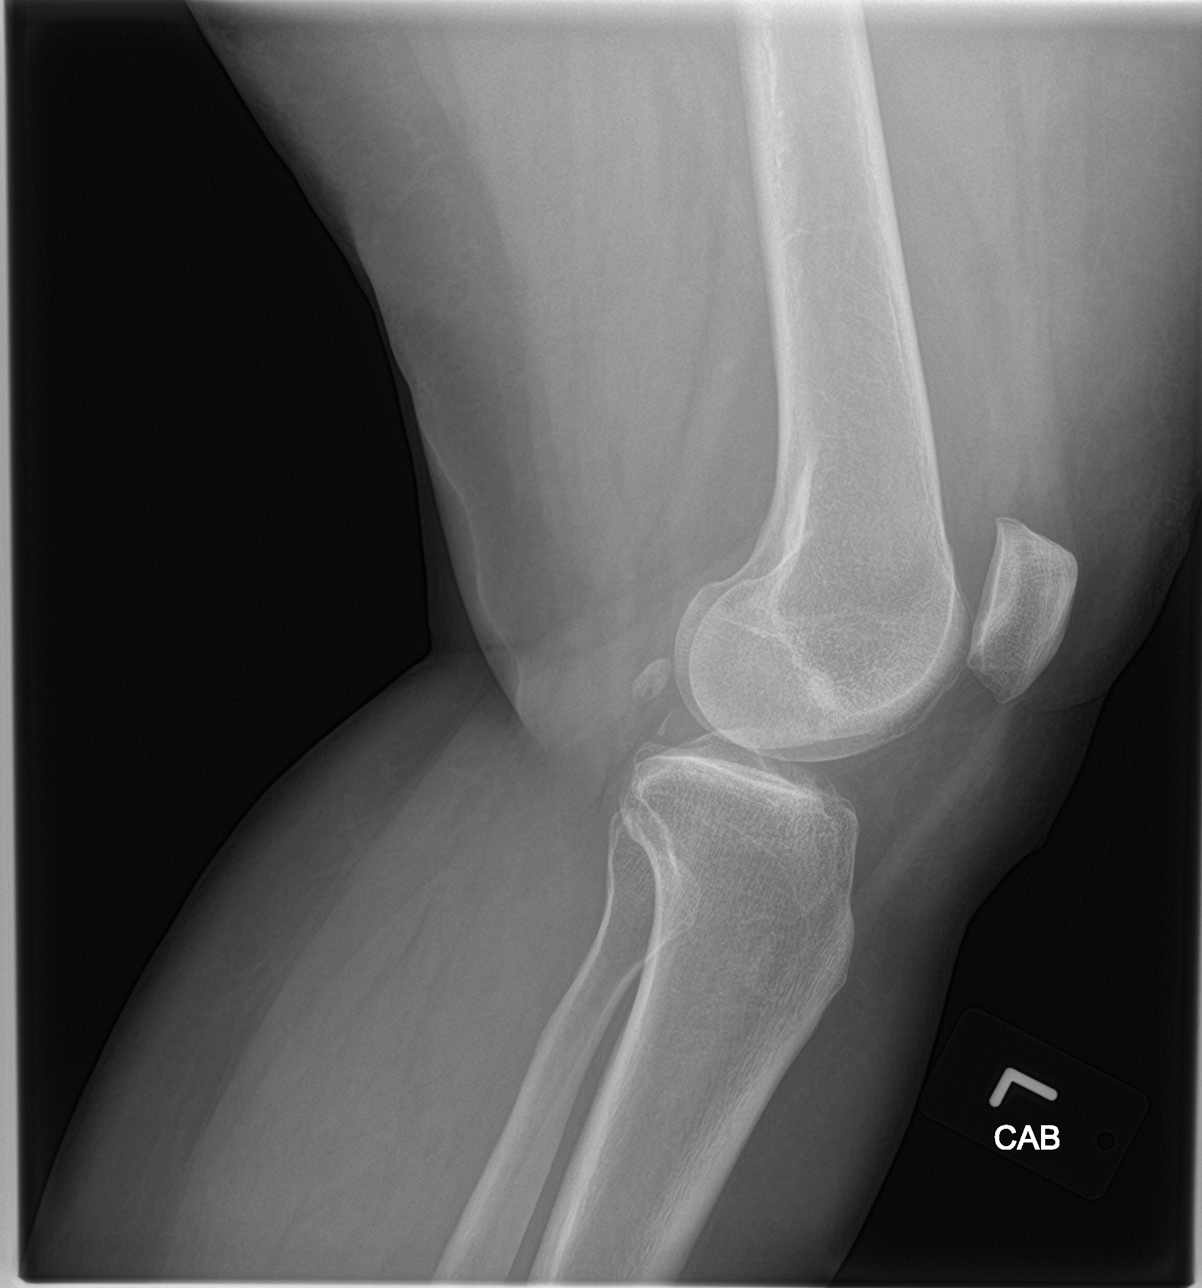

[4 of 4 positions shown; findings below may reference images not displayed]

FINDINGS: Small crescentic bony structure in the posterior joint line. No
visible donor site or joint effusion to suggest this is an acute
fracture. Normal medial alignment. Early marginal spurring.
IMPRESSION: 1. Small bony structure in the posterior joint line that is favored
chronic. No donor site or joint effusion to suggest acute avulsion
fracture.
2. Mild degenerative spurring.

## 2018-12-22 ENCOUNTER — Ambulatory Visit (INDEPENDENT_AMBULATORY_CARE_PROVIDER_SITE_OTHER): Payer: Self-pay | Admitting: Psychiatry

## 2018-12-22 ENCOUNTER — Encounter: Payer: Self-pay | Admitting: Psychiatry

## 2018-12-22 ENCOUNTER — Other Ambulatory Visit: Payer: Self-pay

## 2018-12-22 DIAGNOSIS — F411 Generalized anxiety disorder: Secondary | ICD-10-CM

## 2018-12-22 DIAGNOSIS — F5105 Insomnia due to other mental disorder: Secondary | ICD-10-CM

## 2018-12-22 DIAGNOSIS — F331 Major depressive disorder, recurrent, moderate: Secondary | ICD-10-CM

## 2018-12-22 DIAGNOSIS — F410 Panic disorder [episodic paroxysmal anxiety] without agoraphobia: Secondary | ICD-10-CM

## 2018-12-22 MED ORDER — HYDROXYZINE HCL 25 MG PO TABS: 25.0000 mg | ORAL_TABLET | Freq: Two times a day (BID) | ORAL | 1 refills | Status: DC | PRN

## 2018-12-22 NOTE — Progress Notes (Signed)
Virtual Visit via Video Note  I connected with Kelly [name] on 12/22/18 at  4:30 PM EDT by a video enabled telemedicine application and verified that I am speaking with the correct person using two identifiers.   I discussed the limitations of evaluation and management by telemedicine and the availability of in person appointments. The patient expressed understanding and agreed to proceed.  I discussed the assessment and treatment plan with the patient. The patient was provided an opportunity to ask questions and all were answered. The patient agreed with the plan and demonstrated an understanding of the instructions.   The patient was advised to call back or seek an in-person evaluation if the symptoms worsen or if the condition fails to improve as anticipated.   [place] MD OP Progress Note  12/22/2018 5:27 PM LOREE SHEHATA  MRN:  [id number]  Chief Complaint:  Chief Complaint    Follow-up     HPI: Raygen is a 53 year old Caucasian female who is married, unemployed, lives in [place], has a history of depression, anxiety, morbid obesity, gastroesophageal reflux disease, bulging lumbar disc, migraine headaches was evaluated by telemedicine today.  Patient today reports she is tolerating the medications well.  She denies any significant mood lability.  She reports her anxiety and depressive symptoms are currently more controlled.  She reports she has been taking the prazosin which has been very helpful with her nightmares.  Her husband has actually commented on that.  She reports sleep is okay except for some restless leg on and off.  She reports her Requip helps to some extent.  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  She is attending a webinar today.  She is excited about that.  She reports she is happy that her son is doing well.  She also reports she does not have to pay child support anymore for her  adopted daughter since she turned 20 this month.  She reports that has taken some financial burden of her shoulder.  She otherwise denies any other concerns today.  She denies any suicidality, homicidality or perceptual disturbances. Visit Diagnosis:    ICD-10-CM   1. MDD (major depressive disorder), recurrent episode, moderate (HCC)  F33.1    Improving  2. GAD (generalized anxiety disorder)  F41.1 hydrOXYzine (ATARAX/VISTARIL) 25 MG tablet  3. Panic disorder  F41.0   4. Insomnia due to mental condition  F51.05     Past Psychiatric History: Reviewed past psychiatric history from my progress note on 10/25/2018.  Trials of medications like Effexor, trazodone, hydroxyzine.  Past Medical History:  Past Medical History:  Diagnosis Date  . Allergy   . Anxiety   . Depression   . GERD (gastroesophageal reflux disease)   . History of kidney stones   . Insomnia   . Intermittent low back pain   . Migraines   . Osteoarthritis   . Recurrent UTI   . Restless leg syndrome   . Sciatica of right side   . Symptomatic menopausal or female climacteric states   . Vertigo   . Vitamin D deficiency     Past Surgical History:  Procedure Laterality Date  . BACK SURGERY    . DIAGNOSTIC LAPAROSCOPY    . DILATION AND CURETTAGE OF UTERUS    . KNEE ARTHROSCOPY WITH MEDIAL MENISECTOMY Left 01/14/2017   Procedure: KNEE ARTHROSCOPY WITH MEDIAL AND LATERAL MENISECTOMY;  Surgeon: Hessie Knows, MD;  Location: [facility] ORS;  Service: Orthopedics;  Laterality: Left;  Partial Knee menisectomy  . KNEE SURGERY Left 05/2011   Dr. Rudene Christians- Arthroscopic  . LUMBAR LAMINECTOMY/DECOMPRESSION MICRODISCECTOMY N/A 09/16/2016   Procedure: LUMBAR LAMINECTOMY/DECOMPRESSION MICRODISCECTOMY 1 LEVEL L5-S1;  Surgeon: Meade Maw, MD;  Location: [facility] ORS;  Service: Neurosurgery;  Laterality: N/A;  . TONSILLECTOMY AND ADENOIDECTOMY    . URETHRAL STRICTURE DILATATION      Family Psychiatric History: I have reviewed family  psychiatric history from my progress note on 10/25/2018.  Family History:  Family History  Problem Relation Age of Onset  . Cancer Paternal Grandmother   . Anxiety disorder Cousin   . Depression Cousin     Social History: Reviewed social history from my progress note on 10/25/2018. Social History   Socioeconomic History  . Marital status: Married    Spouse name: Jenny Reichmann  . Number of children: 2  . Years of education: 80  . Highest education level: Associate degree: occupational, Hotel manager, or vocational program  Occupational History  . 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 Financial resource strain: Hard  . Food insecurity    Worry: Often true    Inability: Often true  . Transportation needs    Medical: No    Non-medical: Yes  Tobacco Use  . Smoking status: Former Smoker    Packs/day: 0.75    Years: 2.00    Pack years: 1.50    Types: Cigarettes    Start date: 07/06/1981    Quit date: 07/07/1983    Years since quitting: 35.4  . Smokeless tobacco: Never Used  Substance and Sexual Activity  . Alcohol use: No    Alcohol/week: 0.0 standard drinks  . Drug use: No  . Sexual activity: Yes    Partners: Male  Lifestyle  . Physical activity    Days per week: 0 days    Minutes per session: 0 min  . Stress: To some extent  Relationships  . Social connections    Talks on phone: More than three times a week    Gets together: Twice a week    Attends religious service: Never    Active member of club or organization: No    Attends meetings of clubs or organizations: Never    Relationship status: Married  Other Topics Concern  . Not on file  Social History Narrative   Married and husband has a lot of medical problems, he has a job      Allergies:  Allergies  Allergen Reactions  . Penicillins Anaphylaxis, Hives, Nausea And Vomiting and Swelling    Has patient had a PCN reaction causing immediate rash, facial/tongue/throat swelling, SOB or lightheadedness with hypotension:  Yes Has patient had a PCN reaction causing severe rash involving mucus membranes or skin necrosis: Yes Has patient had a PCN reaction that required hospitalization No Has patient had a PCN reaction occurring within the last 10 years: Yes If all of the above answers are "NO", then may proceed with Cephalosporin use.   . Chlorhexidine Gluconate Itching and Rash  . Ciprofloxacin Hives  . Clindamycin/Lincomycin Hives  . Erythromycin Hives and Nausea And Vomiting  . Keflex [Cephalexin] Hives  . Lyrica [Pregabalin]     Dizziness, syncope  . Nitrofurantoin Monohyd Macro Hives and Nausea And Vomiting  . Other Hives  . Sulfa Antibiotics Hives and Nausea And Vomiting    Rapid heart rate  . Tetracyclines & Related Hives  . Adhesive [Tape] Rash  . Latex Rash  . Vancomycin Itching and  Rash    Metabolic Disorder Labs: Lab Results  Component Value Date   HGBA1C 5.4 06/17/2018   MPG 108 06/17/2018   MPG 114 12/31/2016   No results found for: PROLACTIN Lab Results  Component Value Date   CHOL 160 06/17/2018   TRIG 119 06/17/2018   HDL 56 06/17/2018   CHOLHDL 2.9 06/17/2018   VLDL 22 12/31/2016   LDLCALC 82 06/17/2018   LDLCALC 64 12/31/2016   Lab Results  Component Value Date   TSH 2.56 12/31/2016   TSH 2.80 02/27/2016    Therapeutic Level Labs: No results found for: LITHIUM No results found for: VALPROATE No components found for:  CBMZ  Current Medications: Current Outpatient Medications  Medication Sig Dispense Refill  . hydrOXYzine (ATARAX/VISTARIL) 25 MG tablet Take 1 tablet (25 mg total) by mouth 2 (two) times daily as needed for anxiety. 180 tablet 1  . loratadine (CLARITIN) 10 MG tablet Take 1 tablet (10 mg total) by mouth daily. 90 tablet 3  . omeprazole (PRILOSEC) 40 MG capsule Take 1 capsule (40 mg total) by mouth every morning. 90 capsule 1  . prazosin (MINIPRESS) 1 MG capsule Take 1 capsule (1 mg total) by mouth at bedtime. 90 capsule 0  . rOPINIRole (REQUIP) 1  MG tablet Take 1 tablet (1 mg total) by mouth at bedtime. 90 tablet 0  . tizanidine (ZANAFLEX) 2 MG capsule Take 1 capsule (2 mg total) by mouth at bedtime. 30 capsule 0  . venlafaxine XR (EFFEXOR XR) 75 MG 24 hr capsule Take 1 capsule (75 mg total) by mouth daily with breakfast. To be added to 150 mg to make it 225 mg 90 capsule 1  . venlafaxine XR (EFFEXOR-XR) 150 MG 24 hr capsule Take 1 capsule (150 mg total) by mouth daily with breakfast. 90 capsule 1   No current facility-administered medications for this visit.      Musculoskeletal: Strength & Muscle Tone: within normal limits Gait & Station: normal Patient leans: N/A  Psychiatric Specialty Exam: Review of Systems  Psychiatric/Behavioral: The patient is not nervous/anxious.   All other systems reviewed and are negative.   There were no vitals taken for this visit.There is no height or weight on file to calculate BMI.  General Appearance: Casual  Eye Contact:  Fair  Speech:  Normal Rate  Volume:  Normal  Mood:  Euthymic  Affect:  Appropriate  Thought Process:  Goal Directed and Descriptions of Associations: Intact  Orientation:  Full (Time, Place, and Person)  Thought Content: Logical   Suicidal Thoughts:  No  Homicidal Thoughts:  No  Memory:  Immediate;   Fair Recent;   Fair Remote;   Fair  Judgement:  Fair  Insight:  Fair  Psychomotor Activity:  Normal  Concentration:  Concentration: Fair and Attention Span: Fair  Recall:  [employer] of Knowledge: Fair  Language: Fair  Akathisia:  No  Handed:  Right  AIMS (if indicated): denies tremors, rigidity  Assets:  Communication Skills Desire for Improvement Social Support  ADL's:  Intact  Cognition: WNL  Sleep:  Fair   Screenings: GAD-7     Office Visit from 09/20/2018 in [facility] Office Visit from 08/30/2018 in [facility] Office Visit from 08/24/2018 in [facility] Office Visit from 06/17/2018 in [facility] Office Visit from 03/09/2018 in [facility]  Total GAD-7 Score  11  10  10  5   9  PHQ2-9     Office Visit from 09/20/2018 in [facility] Office Visit from 08/30/2018 in [facility] Office Visit from 08/24/2018 in [facility] Office Visit from 06/17/2018 in [facility] Office Visit from 03/09/2018 in [facility]  PHQ-2 Total Score  3  3  3  5  3   PHQ-9 Total Score  12  10  10  16  12        Assessment and Plan: Azalynn is a 53 year old Caucasian female, married, unemployed, lives in [place] has a history of MDD, GAD, panic disorder, insomnia, GERD, arthritis, chronic pain was evaluated by telemedicine today.  Patient is biologically predisposed given her family history, history of trauma, multiple medical problems.  Patient also has psychosocial stressors of financial problem and chronic pain.  Patient is currently making progress on the current medication regimen.  Plan as noted below.  Plan MDD- improving Effexor 225 mg p.o. daily Continue psychotherapy sessions with therapist Marjie Skiff.  For GAD-improving Effexor as prescribed Continue CBT  For panic disorder-improving Effexor as prescribed  For insomnia-improving Prazosin 1 mg p.o. nightly for nightmares Melatonin 3 to 5 mg at bedtime as needed  Patient was advised to sign a release obtain medical records from therapist Kathryne Gin  Follow-up in clinic in 1 to 2 months or sooner if needed.  August 28 at 10:30 AM  I have spent atleast 15 minutes non  face to face with patient today. More than 50 % of the time was spent for psychoeducation and supportive psychotherapy and care coordination.  This note was generated in part or whole with voice recognition software. Voice recognition is usually quite accurate but there are transcription errors that can and very often do occur.  I apologize for any typographical errors that were not detected and corrected.        Ursula Alert, MD 12/22/2018, 5:27 PM

## 2019-01-12 ENCOUNTER — Encounter: Payer: Self-pay | Admitting: Family Medicine

## 2019-01-12 ENCOUNTER — Telehealth: Payer: Self-pay

## 2019-01-12 ENCOUNTER — Ambulatory Visit (INDEPENDENT_AMBULATORY_CARE_PROVIDER_SITE_OTHER): Payer: BC Managed Care – PPO | Admitting: Family Medicine

## 2019-01-12 ENCOUNTER — Other Ambulatory Visit: Payer: Self-pay

## 2019-01-12 VITALS — Temp 98.5°F | Wt 205.0 lb

## 2019-01-12 DIAGNOSIS — G43009 Migraine without aura, not intractable, without status migrainosus: Secondary | ICD-10-CM

## 2019-01-12 DIAGNOSIS — Z9181 History of falling: Secondary | ICD-10-CM

## 2019-01-12 DIAGNOSIS — K219 Gastro-esophageal reflux disease without esophagitis: Secondary | ICD-10-CM

## 2019-01-12 DIAGNOSIS — J302 Other seasonal allergic rhinitis: Secondary | ICD-10-CM

## 2019-01-12 DIAGNOSIS — Z1211 Encounter for screening for malignant neoplasm of colon: Secondary | ICD-10-CM

## 2019-01-12 DIAGNOSIS — G2581 Restless legs syndrome: Secondary | ICD-10-CM | POA: Diagnosis not present

## 2019-01-12 DIAGNOSIS — M542 Cervicalgia: Secondary | ICD-10-CM

## 2019-01-12 DIAGNOSIS — J3089 Other allergic rhinitis: Secondary | ICD-10-CM

## 2019-01-12 MED ORDER — TIZANIDINE HCL 2 MG PO CAPS: 2.0000 mg | ORAL_CAPSULE | Freq: Every day | ORAL | 0 refills | Status: DC | PRN

## 2019-01-12 MED ORDER — ROPINIROLE HCL 1 MG PO TABS: 1.0000 mg | ORAL_TABLET | Freq: Every day | ORAL | 0 refills | Status: DC

## 2019-01-12 MED ORDER — SUMATRIPTAN SUCCINATE 100 MG PO TABS: 100.0000 mg | ORAL_TABLET | ORAL | 0 refills | Status: DC | PRN

## 2019-01-12 MED ORDER — FLUTICASONE PROPIONATE 50 MCG/ACT NA SUSP: 2.0000 | Freq: Every day | NASAL | 2 refills | Status: DC

## 2019-01-12 MED ORDER — MONTELUKAST SODIUM 10 MG PO TABS: 10.0000 mg | ORAL_TABLET | Freq: Every day | ORAL | 1 refills | Status: DC

## 2019-01-12 MED ORDER — OMEPRAZOLE 40 MG PO CPDR: 40.0000 mg | DELAYED_RELEASE_CAPSULE | Freq: Every morning | ORAL | 1 refills | Status: DC

## 2019-01-12 MED ORDER — TOPIRAMATE 50 MG PO TABS: 50.0000 mg | ORAL_TABLET | Freq: Two times a day (BID) | ORAL | 0 refills | Status: DC

## 2019-01-12 NOTE — Telephone Encounter (Signed)
Gastroenterology Pre-Procedure Review  Request Date: 02/03/19 Requesting Physician: Dr. Vicente Males  PATIENT REVIEW QUESTIONS: The patient responded to the following health history questions as indicated:    1. Are you having any GI issues? no 2. Do you have a personal history of Polyps? no 3. Do you have a family history of Colon Cancer or Polyps? no 4. Diabetes Mellitus? no 5. Joint replacements in the past 12 months?no 6. Major health problems in the past 3 months?no 7. Any artificial heart valves, MVP, or defibrillator?no    MEDICATIONS & ALLERGIES:    Patient reports the following regarding taking any anticoagulation/antiplatelet therapy:   Plavix, Coumadin, Eliquis, Xarelto, Lovenox, Pradaxa, Brilinta, or Effient? no Aspirin? no  Patient confirms/reports the following medications:  Current Outpatient Medications  Medication Sig Dispense Refill  . fluticasone (FLONASE) 50 MCG/ACT nasal spray Place 2 sprays into both nostrils daily. 16 g 2  . hydrOXYzine (ATARAX/VISTARIL) 25 MG tablet Take 1 tablet (25 mg total) by mouth 2 (two) times daily as needed for anxiety. 180 tablet 1  . loratadine (CLARITIN) 10 MG tablet Take 1 tablet (10 mg total) by mouth daily. 90 tablet 3  . montelukast (SINGULAIR) 10 MG tablet Take 1 tablet (10 mg total) by mouth at bedtime. 90 tablet 1  . omeprazole (PRILOSEC) 40 MG capsule Take 1 capsule (40 mg total) by mouth every morning. 90 capsule 1  . prazosin (MINIPRESS) 1 MG capsule Take 1 capsule (1 mg total) by mouth at bedtime. 90 capsule 0  . rOPINIRole (REQUIP) 1 MG tablet Take 1 tablet (1 mg total) by mouth at bedtime. 90 tablet 0  . SUMAtriptan (IMITREX) 100 MG tablet Take 1 tablet (100 mg total) by mouth every 2 (two) hours as needed for migraine. May repeat in 2 hours if headache persists or recurs. 10 tablet 0  . tizanidine (ZANAFLEX) 2 MG capsule Take 1 capsule (2 mg total) by mouth daily as needed for muscle spasms. 90 capsule 0  . topiramate (TOPAMAX)  50 MG tablet Take 1 tablet (50 mg total) by mouth 2 (two) times daily. Start at 50 at night and go up every 3 days on dose to max to 2 twice daily 120 tablet 0  . venlafaxine XR (EFFEXOR XR) 75 MG 24 hr capsule Take 1 capsule (75 mg total) by mouth daily with breakfast. To be added to 150 mg to make it 225 mg 90 capsule 1  . venlafaxine XR (EFFEXOR-XR) 150 MG 24 hr capsule Take 1 capsule (150 mg total) by mouth daily with breakfast. 90 capsule 1   No current facility-administered medications for this visit.     Patient confirms/reports the following allergies:  Allergies  Allergen Reactions  . Penicillins Anaphylaxis, Hives, Nausea And Vomiting and Swelling    Has patient had a PCN reaction causing immediate rash, facial/tongue/throat swelling, SOB or lightheadedness with hypotension: Yes Has patient had a PCN reaction causing severe rash involving mucus membranes or skin necrosis: Yes Has patient had a PCN reaction that required hospitalization No Has patient had a PCN reaction occurring within the last 10 years: Yes If all of the above answers are "NO", then may proceed with Cephalosporin use.   . Chlorhexidine Gluconate Itching and Rash  . Ciprofloxacin Hives  . Clindamycin/Lincomycin Hives  . Erythromycin Hives and Nausea And Vomiting  . Keflex [Cephalexin] Hives  . Lyrica [Pregabalin]     Dizziness, syncope  . Nitrofurantoin Monohyd Macro Hives and Nausea And Vomiting  . Other Hives  .  Sulfa Antibiotics Hives and Nausea And Vomiting    Rapid heart rate  . Tetracyclines & Related Hives  . Adhesive [Tape] Rash  . Latex Rash  . Vancomycin Itching and Rash    No orders of the defined types were placed in this encounter.   AUTHORIZATION INFORMATION Primary Insurance: 1D#: Group #:  Secondary Insurance: 1D#: Group #:  SCHEDULE INFORMATION: Date: 02/03/19 Time: Location:[facility]

## 2019-01-12 NOTE — Progress Notes (Signed)
Name: Kelly [name]   MRN: [id number]    DOB: 06/07/1966   Date:01/12/2019       Progress Note  Subjective  Chief Complaint  Chief Complaint  Patient presents with  . Cough  . Ear Drainage    right ear is worst, left ear is just starting x1 week. Drainage is constant  . Migraine    x 5 days last week. She also had vomitting and diarrhea with her migraine.    I connected with  [name]  on 01/12/19 at  9:00 AM EDT by a video enabled telemedicine application and verified that I am speaking with the correct person using two identifiers.  I discussed the limitations of evaluation and management by telemedicine and the availability of in person appointments. The patient expressed understanding and agreed to proceed. Staff also discussed with the patient that there may be a patient responsible charge related to this service. Patient Location: at home  Provider Location: [facility]   HPI  Migraine: she states she has not have a severe migraine for a long time, but last week it was severe and it lasted 5 days. She had photophobia, phonophobia, dizziness , she also developed diarrhea and vomiting. She states it was the worse symptoms of migraine headaches. She would like to go back on Topamax to avoid recurrence, she took it in the past and is aware of side effects. We will also add imitrex to take prn   Ear Drainage: wakes up with wax on her sheets in am, no pain, discussed peroxide and warm water at night  Perennial allergic rhinitis : she has noticed symptoms have been worse lately, she has been around a cat and had to increase dose of loratadine to 20 mg daily, explained not FDA approved but better to take it BID and add sigulair at night, we will also resume flonase for nasal congestion and post-nasal drainage.   RLS: she needs a refill of Requip  History of fall: she tripped over a baby gate about one month ago, no pain anymore but had some bruises,  discussed ways to prevent falls  GERD; taking medication and symptoms are controlled at this time  Neck pain: seen by ortho for shoulder pain and diagnosed with DDD cervical spine, no radiculitis, but states zanaflex helps with pain.   Patient Active Problem List   Diagnosis Date Noted  . Morbid obesity ([place]) 07/26/2017  . Spondylolisthesis of lumbosacral region 02/11/2016  . Hematuria 04/09/2015  . Hyperglycemia 04/06/2015  . Right lumbar radiculitis 12/28/2014  . Moderate recurrent major depression ([place]) 12/27/2014  . Gastro-esophageal reflux disease without esophagitis 12/27/2014  . Bulge of lumbar disc without myelopathy 12/27/2014  . Dysmetabolic syndrome 00/92/3300  . Migraine without aura and responsive to treatment 12/27/2014  . Osteoarthrosis 12/27/2014  . Obesity (BMI 30-39.9) 12/27/2014  . Restless leg 12/27/2014  . Allergic rhinitis, seasonal 12/27/2014    Past Surgical History:  Procedure Laterality Date  . BACK SURGERY    . DIAGNOSTIC LAPAROSCOPY    . DILATION AND CURETTAGE OF UTERUS    . KNEE ARTHROSCOPY WITH MEDIAL MENISECTOMY Left 01/14/2017   Procedure: KNEE ARTHROSCOPY WITH MEDIAL AND LATERAL MENISECTOMY;  Surgeon: Hessie Knows, MD;  Location: [facility] ORS;  Service: Orthopedics;  Laterality: Left;  Partial Knee menisectomy  . KNEE SURGERY Left 05/2011   Dr. Rudene Christians- Arthroscopic  . LUMBAR LAMINECTOMY/DECOMPRESSION MICRODISCECTOMY N/A 09/16/2016   Procedure: LUMBAR LAMINECTOMY/DECOMPRESSION MICRODISCECTOMY 1 LEVEL L5-S1;  Surgeon: Ardyth Gal  Izora Ribas, MD;  Location: [facility] ORS;  Service: Neurosurgery;  Laterality: N/A;  . TONSILLECTOMY AND ADENOIDECTOMY    . URETHRAL STRICTURE DILATATION      Family History  Problem Relation Age of Onset  . Cancer Paternal Grandmother   . Anxiety disorder Cousin   . Depression Cousin     Social History   Socioeconomic History  . Marital status: Married    Spouse name: Jenny Reichmann  . Number of children: 2  . Years of education: 41   . Highest education level: Associate degree: occupational, Hotel manager, or vocational program  Occupational History  . 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 Financial resource strain: Hard  . Food insecurity    Worry: Often true    Inability: Often true  . Transportation needs    Medical: No    Non-medical: Yes  Tobacco Use  . Smoking status: Former Smoker    Packs/day: 0.75    Years: 2.00    Pack years: 1.50    Types: Cigarettes    Start date: 07/06/1981    Quit date: 07/07/1983    Years since quitting: 35.5  . Smokeless tobacco: Never Used  Substance and Sexual Activity  . Alcohol use: No    Alcohol/week: 0.0 standard drinks  . Drug use: No  . Sexual activity: Yes    Partners: Male  Lifestyle  . Physical activity    Days per week: 0 days    Minutes per session: 0 min  . Stress: To some extent  Relationships  . Social connections    Talks on phone: More than three times a week    Gets together: Twice a week    Attends religious service: Never    Active member of club or organization: No    Attends meetings of clubs or organizations: Never    Relationship status: Married  . Intimate partner violence    Fear of current or ex partner: No    Emotionally abused: No    Physically abused: No    Forced sexual activity: No  Other Topics Concern  . Not on file  Social History Narrative   Married and husband has a lot of medical problems, he has a job       Current Outpatient Medications:  .  hydrOXYzine (ATARAX/VISTARIL) 25 MG tablet, Take 1 tablet (25 mg total) by mouth 2 (two) times daily as needed for anxiety., Disp: 180 tablet, Rfl: 1 .  loratadine (CLARITIN) 10 MG tablet, Take 1 tablet (10 mg total) by mouth daily., Disp: 90 tablet, Rfl: 3 .  omeprazole (PRILOSEC) 40 MG capsule, Take 1 capsule (40 mg total) by mouth every morning., Disp: 90 capsule, Rfl: 1 .  prazosin (MINIPRESS) 1 MG capsule, Take 1 capsule (1 mg total) by mouth at bedtime., Disp: 90 capsule, Rfl:  0 .  rOPINIRole (REQUIP) 1 MG tablet, Take 1 tablet (1 mg total) by mouth at bedtime., Disp: 90 tablet, Rfl: 0 .  tizanidine (ZANAFLEX) 2 MG capsule, Take 1 capsule (2 mg total) by mouth at bedtime., Disp: 30 capsule, Rfl: 0 .  venlafaxine XR (EFFEXOR XR) 75 MG 24 hr capsule, Take 1 capsule (75 mg total) by mouth daily with breakfast. To be added to 150 mg to make it 225 mg, Disp: 90 capsule, Rfl: 1 .  venlafaxine XR (EFFEXOR-XR) 150 MG 24 hr capsule, Take 1 capsule (150 mg total) by mouth daily with breakfast., Disp: 90 capsule, Rfl: 1  Allergies  Allergen Reactions  .  Penicillins Anaphylaxis, Hives, Nausea And Vomiting and Swelling    Has patient had a PCN reaction causing immediate rash, facial/tongue/throat swelling, SOB or lightheadedness with hypotension: Yes Has patient had a PCN reaction causing severe rash involving mucus membranes or skin necrosis: Yes Has patient had a PCN reaction that required hospitalization No Has patient had a PCN reaction occurring within the last 10 years: Yes If all of the above answers are "NO", then may proceed with Cephalosporin use.   . Chlorhexidine Gluconate Itching and Rash  . Ciprofloxacin Hives  . Clindamycin/Lincomycin Hives  . Erythromycin Hives and Nausea And Vomiting  . Keflex [Cephalexin] Hives  . Lyrica [Pregabalin]     Dizziness, syncope  . Nitrofurantoin Monohyd Macro Hives and Nausea And Vomiting  . Other Hives  . Sulfa Antibiotics Hives and Nausea And Vomiting    Rapid heart rate  . Tetracyclines & Related Hives  . Adhesive [Tape] Rash  . Latex Rash  . Vancomycin Itching and Rash    I personally reviewed active problem list, medication list, allergies, family history with the patient/caregiver today.   ROS  Ten systems reviewed and is negative except as mentioned in HPI   Objective  Virtual encounter, vitals not obtained.  There is no height or weight on file to calculate BMI.  Physical Exam   Awake, alert and  oriented   PHQ2/9: Depression screen [facility] 2/9 01/12/2019 09/20/2018 08/30/2018 08/24/2018 06/17/2018  Decreased Interest 1 2 1 1 3   Down, Depressed, Hopeless 1 1 2 2 2   PHQ - 2 Score 2 3 3 3 5   Altered sleeping 2 3 2 2 3   Tired, decreased energy 2 2 1 1 2   Change in appetite 1 1 2 2 2   Feeling bad or failure about yourself  0 0 1 1 2   Trouble concentrating 0 2 1 1 1   Moving slowly or fidgety/restless 0 1 0 0 1  Suicidal thoughts 0 0 0 0 0  PHQ-9 Score 7 12 10 10 16   Difficult doing work/chores Somewhat difficult Very difficult Not difficult at all Somewhat difficult Very difficult  Some recent data might be hidden   PHQ-2/9 Result is positive.    Fall Risk: Fall Risk  01/12/2019 08/30/2018 08/24/2018 06/17/2018 03/09/2018  Falls in the past year? 1 1 1 1  No  Number falls in past yr: 1 1 1 1  -  Comment - - - - -  Injury with Fall? 1 1 1 1  -  Comment - - - Twisted Left Knee and fell on Left Hip -  Risk for fall due to : - History of fall(s);Impaired balance/gait History of fall(s);Impaired balance/gait Impaired balance/gait;History of fall(s) -  Risk for fall due to: Comment - - - Left Knee and Right Hip are weak -  Follow up - - - - -     Assessment & Plan  1. Gastro-esophageal reflux disease without esophagitis  - omeprazole (PRILOSEC) 40 MG capsule; Take 1 capsule (40 mg total) by mouth every morning.  Dispense: 90 capsule; Refill: 1  2. Restless leg  - rOPINIRole (REQUIP) 1 MG tablet; Take 1 tablet (1 mg total) by mouth at bedtime.  Dispense: 90 tablet; Refill: 0  3. Migraine without aura and responsive to treatment  - SUMAtriptan (IMITREX) 100 MG tablet; Take 1 tablet (100 mg total) by mouth every 2 (two) hours as needed for migraine. May repeat in 2 hours if headache persists or recurs.  Dispense: 10 tablet; Refill: 0 -  topiramate (TOPAMAX) 50 MG tablet; Take 1 tablet (50 mg total) by mouth 2 (two) times daily. Start at 50 at night and go up every 3 days on dose to max to 2  twice daily  Dispense: 120 tablet; Refill: 0  4. Perennial allergic rhinitis with seasonal variation  - montelukast (SINGULAIR) 10 MG tablet; Take 1 tablet (10 mg total) by mouth at bedtime.  Dispense: 90 tablet; Refill: 1 - fluticasone (FLONASE) 50 MCG/ACT nasal spray; Place 2 sprays into both nostrils daily.  Dispense: 16 g; Refill: 2  5. History of recent fall   6. Colon cancer screening  - Ambulatory referral to Gastroenterology  7. Bilateral neck pain  - tizanidine (ZANAFLEX) 2 MG capsule; Take 1 capsule (2 mg total) by mouth daily as needed for muscle spasms.  Dispense: 90 capsule; Refill: 0  I discussed the assessment and treatment plan with the patient. The patient was provided an opportunity to ask questions and all were answered. The patient agreed with the plan and demonstrated an understanding of the instructions.  The patient was advised to call back or seek an in-person evaluation if the symptoms worsen or if the condition fails to improve as anticipated.  I provided 25 minutes of non-face-to-face time during this encounter.

## 2019-01-23 ENCOUNTER — Telehealth: Payer: Self-pay | Admitting: Gastroenterology

## 2019-01-23 NOTE — Telephone Encounter (Signed)
Pt left vm she has a question about the rx she has received for her procedure for 02/03/19

## 2019-01-24 ENCOUNTER — Other Ambulatory Visit: Payer: Self-pay

## 2019-01-24 ENCOUNTER — Telehealth: Payer: Self-pay

## 2019-01-24 DIAGNOSIS — Z1211 Encounter for screening for malignant neoplasm of colon: Secondary | ICD-10-CM

## 2019-01-24 MED ORDER — PEG 3350-KCL-NA BICARB-NACL 420 G PO SOLR: 4000.0000 mL | Freq: Once | ORAL | 0 refills | Status: AC

## 2019-01-24 NOTE — Telephone Encounter (Signed)
Patients bowel prep has been changed to Nulytely instead of Golytely because Golytely was not covered by insurance.  Changed to Nulytely due to patient allergy to Sulfa.  Discussed with Ginger, she states that this is an appropriate med change.  Thanks [employer]

## 2019-01-24 NOTE — Telephone Encounter (Signed)
Patient is allergic to Sulfa.  Her colonoscopy is scheduled with you on 02/03/19.  What bowel prep would be appropriate for her to prep with? Please advise.  Thanks [employer]

## 2019-01-24 NOTE — Telephone Encounter (Signed)
Patient is allergic to Sulfa and can't take the prep due to this allergy. Please call patient.

## 2019-01-24 NOTE — Telephone Encounter (Signed)
Ginger/Michelle - I think golytely should be ok can we check with our hospital pharmacy .

## 2019-01-31 ENCOUNTER — Encounter
Admission: RE | Admit: 2019-01-31 | Discharge: 2019-01-31 | Disposition: A | Discharge status: Home / Self Care | Payer: BC Managed Care – PPO | Source: Ambulatory Visit | Attending: Gastroenterology | Admitting: Gastroenterology

## 2019-01-31 ENCOUNTER — Other Ambulatory Visit: Payer: Self-pay

## 2019-01-31 DIAGNOSIS — Z1159 Encounter for screening for other viral diseases: Secondary | ICD-10-CM | POA: Diagnosis not present

## 2019-01-31 LAB — SARS CORONAVIRUS 2 (TAT 6-24 HRS): SARS Coronavirus 2: NEGATIVE

## 2019-02-03 ENCOUNTER — Ambulatory Visit: Payer: BC Managed Care – PPO | Admitting: Certified Registered Nurse Anesthetist

## 2019-02-03 ENCOUNTER — Ambulatory Visit
Admission: RE | Admit: 2019-02-03 | Discharge: 2019-02-03 | Disposition: A | Discharge status: Home / Self Care | Payer: BC Managed Care – PPO | Attending: Gastroenterology | Admitting: Gastroenterology

## 2019-02-03 ENCOUNTER — Encounter: Payer: Self-pay | Admitting: Certified Registered Nurse Anesthetist

## 2019-02-03 ENCOUNTER — Encounter
Admission: RE | Disposition: A | Discharge status: Home / Self Care | Payer: Self-pay | Source: Home / Self Care | Attending: Gastroenterology

## 2019-02-03 ENCOUNTER — Other Ambulatory Visit: Payer: Self-pay

## 2019-02-03 DIAGNOSIS — Z1211 Encounter for screening for malignant neoplasm of colon: Secondary | ICD-10-CM | POA: Diagnosis not present

## 2019-02-03 DIAGNOSIS — Z539 Procedure and treatment not carried out, unspecified reason: Secondary | ICD-10-CM | POA: Insufficient documentation

## 2019-02-03 HISTORY — PX: COLONOSCOPY WITH PROPOFOL: SHX5780

## 2019-02-03 LAB — POCT PREGNANCY, URINE: Preg Test, Ur: NEGATIVE

## 2019-02-03 SURGERY — COLONOSCOPY WITH PROPOFOL
Anesthesia: General

## 2019-02-03 MED ORDER — LIDOCAINE HCL (PF) 2 % IJ SOLN
INTRAMUSCULAR | Status: AC
  Filled 2019-02-03: qty 10

## 2019-02-03 MED ORDER — MIDAZOLAM HCL 2 MG/2ML IJ SOLN
INTRAMUSCULAR | Status: AC
  Filled 2019-02-03: qty 2

## 2019-02-03 MED ORDER — PROPOFOL 500 MG/50ML IV EMUL
INTRAVENOUS | Status: AC
  Filled 2019-02-03: qty 50

## 2019-02-03 MED ORDER — SODIUM CHLORIDE 0.9 % IV SOLN: INTRAVENOUS | Status: DC

## 2019-02-03 NOTE — Anesthesia Preprocedure Evaluation (Signed)
Anesthesia Evaluation  Patient identified by MRN, date of birth, ID band Patient awake    Reviewed: Allergy & Precautions, NPO status , Patient's Chart, lab work & pertinent test results  History of Anesthesia Complications Negative for: history of anesthetic complications  Airway Mallampati: II  TM Distance: >3 FB Neck ROM: Full    Dental no notable dental hx.    Pulmonary neg sleep apnea, neg COPD, former smoker,    breath sounds clear to auscultation- rhonchi (-) wheezing      Cardiovascular Exercise Tolerance: Good (-) hypertension(-) CAD, (-) Past MI, (-) Cardiac Stents and (-) CABG  Rhythm:Regular Rate:Normal - Systolic murmurs and - Diastolic murmurs    Neuro/Psych  Headaches, neg Seizures PSYCHIATRIC DISORDERS Anxiety Depression    GI/Hepatic Neg liver ROS, GERD  ,  Endo/Other  negative endocrine ROSneg diabetes  Renal/GU negative Renal ROS     Musculoskeletal  (+) Arthritis ,   Abdominal (+) + obese,   Peds  Hematology negative hematology ROS (+)   Anesthesia Other Findings Past Medical History: No date: Allergy No date: Anxiety No date: Depression No date: GERD (gastroesophageal reflux disease) No date: History of kidney stones No date: Insomnia No date: Intermittent low back pain No date: Migraines No date: Osteoarthritis No date: Recurrent UTI No date: Restless leg syndrome No date: Sciatica of right side No date: Symptomatic menopausal or female climacteric states No date: Vertigo No date: Vitamin D deficiency   Reproductive/Obstetrics                             Anesthesia Physical Anesthesia Plan  ASA: II  Anesthesia Plan: General   Post-op Pain Management:    Induction: Intravenous  PONV Risk Score and Plan: 2 and Propofol infusion  Airway Management Planned: Natural Airway  Additional Equipment:   Intra-op Plan:   Post-operative Plan:   Informed  Consent: I have reviewed the patients History and Physical, chart, labs and discussed the procedure including the risks, benefits and alternatives for the proposed anesthesia with the patient or authorized representative who has indicated his/her understanding and acceptance.     Dental advisory given  Plan Discussed with: CRNA and Anesthesiologist  Anesthesia Plan Comments:         Anesthesia Quick Evaluation

## 2019-02-06 ENCOUNTER — Encounter: Payer: Self-pay | Admitting: Gastroenterology

## 2019-02-07 ENCOUNTER — Other Ambulatory Visit: Payer: Self-pay

## 2019-02-07 DIAGNOSIS — Z1211 Encounter for screening for malignant neoplasm of colon: Secondary | ICD-10-CM

## 2019-02-19 ENCOUNTER — Other Ambulatory Visit: Payer: Self-pay | Admitting: Nurse Practitioner

## 2019-02-19 DIAGNOSIS — F331 Major depressive disorder, recurrent, moderate: Secondary | ICD-10-CM

## 2019-02-22 ENCOUNTER — Other Ambulatory Visit: Payer: Self-pay | Admitting: Family Medicine

## 2019-02-22 ENCOUNTER — Encounter: Payer: Self-pay | Admitting: Family Medicine

## 2019-02-22 ENCOUNTER — Ambulatory Visit (INDEPENDENT_AMBULATORY_CARE_PROVIDER_SITE_OTHER): Payer: BC Managed Care – PPO | Admitting: Family Medicine

## 2019-02-22 ENCOUNTER — Other Ambulatory Visit: Payer: Self-pay

## 2019-02-22 VITALS — BP 110/70 | HR 115 | Temp 96.8°F | Resp 16 | Ht 62.25 in | Wt 209.2 lb

## 2019-02-22 DIAGNOSIS — Z01419 Encounter for gynecological examination (general) (routine) without abnormal findings: Secondary | ICD-10-CM

## 2019-02-22 DIAGNOSIS — G43009 Migraine without aura, not intractable, without status migrainosus: Secondary | ICD-10-CM

## 2019-02-22 NOTE — Patient Instructions (Addendum)
Check with insurance if they pay for Shingrix vaccine    Preventive Care 50-54 Years Old, Female Preventive care refers to visits with your health care provider and lifestyle choices that can promote health and wellness. This includes:  A yearly physical exam. This may also be called an annual well check.  Regular dental visits and eye exams.  Immunizations.  Screening for certain conditions.  Healthy lifestyle choices, such as eating a healthy diet, getting regular exercise, not using drugs or products that contain nicotine and tobacco, and limiting alcohol use. What can I expect for my preventive care visit? Physical exam Your health care provider will check your:  Height and weight. This may be used to calculate body mass index (BMI), which tells if you are at a healthy weight.  Heart rate and blood pressure.  Skin for abnormal spots. Counseling Your health care provider may ask you questions about your:  Alcohol, tobacco, and drug use.  Emotional well-being.  Home and relationship well-being.  Sexual activity.  Eating habits.  Work and work Statistician.  Method of birth control.  Menstrual cycle.  Pregnancy history. What immunizations do I need?  Influenza (flu) vaccine  This is recommended every year. Tetanus, diphtheria, and pertussis (Tdap) vaccine  You may need a Td booster every 10 years. Varicella (chickenpox) vaccine  You may need this if you have not been vaccinated. Zoster (shingles) vaccine  You may need this after age 75. Measles, mumps, and rubella (MMR) vaccine  You may need at least one dose of MMR if you were born in 1957 or later. You may also need a second dose. Pneumococcal conjugate (PCV13) vaccine  You may need this if you have certain conditions and were not previously vaccinated. Pneumococcal polysaccharide (PPSV23) vaccine  You may need one or two doses if you smoke cigarettes or if you have certain conditions.  Meningococcal conjugate (MenACWY) vaccine  You may need this if you have certain conditions. Hepatitis A vaccine  You may need this if you have certain conditions or if you travel or work in places where you may be exposed to hepatitis A. Hepatitis B vaccine  You may need this if you have certain conditions or if you travel or work in places where you may be exposed to hepatitis B. Haemophilus influenzae type b (Hib) vaccine  You may need this if you have certain conditions. Human papillomavirus (HPV) vaccine  If recommended by your health care provider, you may need three doses over 6 months. You may receive vaccines as individual doses or as more than one vaccine together in one shot (combination vaccines). Talk with your health care provider about the risks and benefits of combination vaccines. What tests do I need? Blood tests  Lipid and cholesterol levels. These may be checked every 5 years, or more frequently if you are over 68 years old.  Hepatitis C test.  Hepatitis B test. Screening  Lung cancer screening. You may have this screening every year starting at age 41 if you have a 30-pack-year history of smoking and currently smoke or have quit within the past 15 years.  Colorectal cancer screening. All adults should have this screening starting at age 53 and continuing until age 10. Your health care provider may recommend screening at age 31 if you are at increased risk. You will have tests every 1-10 years, depending on your results and the type of screening test.  Diabetes screening. This is done by checking your blood sugar (glucose) after  you have not eaten for a while (fasting). You may have this done every 1-3 years.  Mammogram. This may be done every 1-2 years. Talk with your health care provider about when you should start having regular mammograms. This may depend on whether you have a family history of breast cancer.  BRCA-related cancer screening. This may be done  if you have a family history of breast, ovarian, tubal, or peritoneal cancers.  Pelvic exam and Pap test. This may be done every 3 years starting at age 59. Starting at age 87, this may be done every 5 years if you have a Pap test in combination with an HPV test. Other tests  Sexually transmitted disease (STD) testing.  Bone density scan. This is done to screen for osteoporosis. You may have this scan if you are at high risk for osteoporosis. Follow these instructions at home: Eating and drinking  Eat a diet that includes fresh fruits and vegetables, whole grains, lean protein, and low-fat dairy.  Take vitamin and mineral supplements as recommended by your health care provider.  Do not drink alcohol if: ? Your health care provider tells you not to drink. ? You are pregnant, may be pregnant, or are planning to become pregnant.  If you drink alcohol: ? Limit how much you have to 0-1 drink a day. ? Be aware of how much alcohol is in your drink. In the [place] one drink equals one 12 oz bottle of beer (355 mL), one 5 oz glass of wine (148 mL), or one 1 oz glass of hard liquor (44 mL). Lifestyle  Take daily care of your teeth and gums.  Stay active. Exercise for at least 30 minutes on 5 or more days each week.  Do not use any products that contain nicotine or tobacco, such as cigarettes, e-cigarettes, and chewing tobacco. If you need help quitting, ask your health care provider.  If you are sexually active, practice safe sex. Use a condom or other form of birth control (contraception) in order to prevent pregnancy and STIs (sexually transmitted infections).  If told by your health care provider, take low-dose aspirin daily starting at age 26. What's next?  Visit your health care provider once a year for a well check visit.  Ask your health care provider how often you should have your eyes and teeth checked.  Stay up to date on all vaccines. This information is not intended to  replace advice given to you by your health care provider. Make sure you discuss any questions you have with your health care provider. Document Released: 07/19/2015 Document Revised: 03/03/2018 Document Reviewed: 03/03/2018 Elsevier Patient Education  2020 [employer].

## 2019-02-22 NOTE — Telephone Encounter (Signed)
Requested medications are due for refill today?  Yes  Requested medications are on the active medication list?  Yes  Last refill - 01/12/2019, #120, 0 refills   Future visit scheduled?  Yes- today  Notes to clinic -   Requested Prescriptions  Pending Prescriptions Disp Refills   topiramate (TOPAMAX) 50 MG tablet [Pharmacy Med Name: TOPIRAMATE 50 MG TABLET] 120 tablet 0    Sig: START BY TAKING ONE TABLET BY MOUTH AT NIGHT. INCREASE EVERY 3 DAYS ON DOSE TO A MAXIMUM OF TWO TABLETS BY MOUTH TWICE DAILY     Not Delegated - Neurology: Anticonvulsants - topiramate & zonisamide Failed - 02/22/2019 10:22 AM      Failed - This refill cannot be delegated      Passed - Cr in normal range and within 360 days    Creat  Date Value Ref Range Status  06/17/2018 0.73 0.50 - 1.05 mg/dL Final    Comment:    For patients >84 years of age, the reference limit for Creatinine is approximately 13% higher for people identified as African-American. .          Passed - CO2 in normal range and within 360 days    CO2  Date Value Ref Range Status  06/17/2018 27 20 - 32 mmol/L Final         Passed - Valid encounter within last 12 months    Recent Outpatient Visits          1 month ago Migraine without aura and responsive to treatment   [facility] Steele Sizer, MD   5 months ago Impingement syndrome of left shoulder   [facility] Steele Sizer, MD   5 months ago Acute pain of left shoulder   S[place]ard, NP   6 months ago Acute pain of left shoulder   Thayer, NP   8 months ago Depression, major, recurrent, moderate [facility])   [facility] Steele Sizer, MD      Future Appointments            Today Steele Sizer, MD [facility], [place]   In 4 weeks Steele Sizer, MD [facility], [facility]

## 2019-02-22 NOTE — Progress Notes (Signed)
Name: Kelly [name]   MRN: [id number]    DOB: 06-10-1966   Date:02/22/2019       Progress Note  Subjective  Chief Complaint  Chief Complaint  Patient presents with  . Annual Exam    HPI   Patient presents for annual CPE.  Diet: eating smaller portions Exercise: continue the hard work   USPSTF grade A and B recommendations    Office Visit from 02/22/2019 in [facility]  AUDIT-C Score  0     Hypertension: BP Readings from Last 3 Encounters:  02/22/19 110/70  02/03/19 (!) 118/48  09/20/18 122/68   Obesity: Wt Readings from Last 3 Encounters:  02/22/19 209 lb 3.2 oz (94.9 kg)  02/03/19 209 lb (94.8 kg)  01/12/19 205 lb (93 kg)   BMI Readings from Last 3 Encounters:  02/22/19 37.96 kg/m  02/03/19 34.78 kg/m  01/12/19 34.11 kg/m    Hep C Screening: due for it, but we will wait until her next follow up STD testing and prevention (HIV/chl/gon/syphilis): not interested  Intimate partner violence: negative screen  Sexual History/Pain during Intercourse: no pain, no discharge or bleeding  Menstrual History/LMP/Abnormal Bleeding: still having cycles and skipping , discussed peri-menopausal symptoms  Incontinence Symptoms: very mild and intermittent symptoms   Advanced Care Planning: A voluntary discussion about advance care planning including the explanation and discussion of advance directives.  Discussed health care proxy and Living will, and the patient was able to identify a health care proxy as husband .  Patient does not have a living will at present time. If patient does have living will, I have requested they bring this to the clinic to be scanned in to their chart.  Breast cancer: she will schedule  BRCA gene screening: N/A Cervical cancer screening: up to date   Osteoporosis Screening: discussed high calcium and vitamin D   Lipids:  Lab Results  Component Value Date   CHOL 160 06/17/2018   CHOL 139 12/31/2016   CHOL 159 02/27/2016    Lab Results  Component Value Date   HDL 56 06/17/2018   HDL 53 12/31/2016   HDL 58 02/27/2016   Lab Results  Component Value Date   LDLCALC 82 06/17/2018   LDLCALC 64 12/31/2016   LDLCALC 74 02/27/2016   Lab Results  Component Value Date   TRIG 119 06/17/2018   TRIG 111 12/31/2016   TRIG 137 02/27/2016   Lab Results  Component Value Date   CHOLHDL 2.9 06/17/2018   CHOLHDL 2.6 12/31/2016   CHOLHDL 2.7 02/27/2016   No results found for: LDLDIRECT  Glucose:  Glucose, Bld  Date Value Ref Range Status  06/17/2018 86 65 - 99 mg/dL Final    Comment:    .            Fasting reference interval .   01/04/2017 87 65 - 99 mg/dL Final  12/31/2016 99 65 - 99 mg/dL Final    Skin cancer: discussed atypical lesions Colorectal cancer: colonoscopy is scheduled  Lung cancer:  Low Dose CT Chest recommended if Age 34-80 years, 30 pack-year currently smoking OR have quit w/in 15years. Patient does not qualify.   ECG:09/2016   Patient Active Problem List   Diagnosis Date Noted  . Morbid obesity ([place]) 07/26/2017  . Spondylolisthesis of lumbosacral region 02/11/2016  . Hematuria 04/09/2015  . Hyperglycemia 04/06/2015  . Right lumbar radiculitis 12/28/2014  . Moderate recurrent major depression ([place]) 12/27/2014  . Gastro-esophageal reflux disease without esophagitis  12/27/2014  . Bulge of lumbar disc without myelopathy 12/27/2014  . Dysmetabolic syndrome 18/84/1660  . Migraine without aura and responsive to treatment 12/27/2014  . Osteoarthrosis 12/27/2014  . Obesity (BMI 30-39.9) 12/27/2014  . Restless leg 12/27/2014  . Allergic rhinitis, seasonal 12/27/2014    Past Surgical History:  Procedure Laterality Date  . BACK SURGERY    . COLONOSCOPY WITH PROPOFOL N/A 02/03/2019   Procedure: COLONOSCOPY WITH PROPOFOL;  Surgeon: Jonathon Bellows, MD;  Location: [facility] ENDOSCOPY;  Service: Gastroenterology;  Laterality: N/A;  . DIAGNOSTIC LAPAROSCOPY    . DILATION AND CURETTAGE OF UTERUS     . KNEE ARTHROSCOPY WITH MEDIAL MENISECTOMY Left 01/14/2017   Procedure: KNEE ARTHROSCOPY WITH MEDIAL AND LATERAL MENISECTOMY;  Surgeon: Hessie Knows, MD;  Location: [facility] ORS;  Service: Orthopedics;  Laterality: Left;  Partial Knee menisectomy  . KNEE SURGERY Left 05/2011   Dr. Rudene Christians- Arthroscopic  . LUMBAR LAMINECTOMY/DECOMPRESSION MICRODISCECTOMY N/A 09/16/2016   Procedure: LUMBAR LAMINECTOMY/DECOMPRESSION MICRODISCECTOMY 1 LEVEL L5-S1;  Surgeon: Meade Maw, MD;  Location: [facility] ORS;  Service: Neurosurgery;  Laterality: N/A;  . TONSILLECTOMY AND ADENOIDECTOMY    . URETHRAL STRICTURE DILATATION      Family History  Problem Relation Age of Onset  . Cancer Paternal Grandmother   . Anxiety disorder Cousin   . Depression Cousin     Social History   Socioeconomic History  . Marital status: Married    Spouse name: Jenny Reichmann  . Number of children: 2  . Years of education: 10  . Highest education level: Associate degree: occupational, Hotel manager, or vocational program  Occupational History  . 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 Financial resource strain: Hard  . Food insecurity    Worry: Often true    Inability: Often true  . Transportation needs    Medical: No    Non-medical: Yes  Tobacco Use  . Smoking status: Former Smoker    Packs/day: 0.75    Years: 2.00    Pack years: 1.50    Types: Cigarettes    Start date: 07/06/1981    Quit date: 07/07/1983    Years since quitting: 35.6  . Smokeless tobacco: Never Used  Substance and Sexual Activity  . Alcohol use: No    Alcohol/week: 0.0 standard drinks  . Drug use: No  . Sexual activity: Yes    Partners: Male  Lifestyle  . Physical activity    Days per week: 5 days    Minutes per session: 30 min  . Stress: To some extent  Relationships  . Social connections    Talks on phone: More than three times a week    Gets together: Twice a week    Attends religious service: Never    Active member of club or organization: No     Attends meetings of clubs or organizations: Never    Relationship status: Married  . Intimate partner violence    Fear of current or ex partner: No    Emotionally abused: No    Physically abused: No    Forced sexual activity: No  Other Topics Concern  . Not on file  Social History Narrative   Married and husband has a lot of medical problems, he is working again      Current Outpatient Medications:  .  fluticasone (FLONASE) 50 MCG/ACT nasal spray, Place 2 sprays into both nostrils daily., Disp: 16 g, Rfl: 2 .  hydrOXYzine (ATARAX/VISTARIL) 25 MG tablet, Take 1 tablet (25 mg total) by mouth  2 (two) times daily as needed for anxiety., Disp: 180 tablet, Rfl: 1 .  loratadine (CLARITIN) 10 MG tablet, Take 1 tablet (10 mg total) by mouth daily., Disp: 90 tablet, Rfl: 3 .  montelukast (SINGULAIR) 10 MG tablet, Take 1 tablet (10 mg total) by mouth at bedtime., Disp: 90 tablet, Rfl: 1 .  omeprazole (PRILOSEC) 40 MG capsule, Take 1 capsule (40 mg total) by mouth every morning., Disp: 90 capsule, Rfl: 1 .  prazosin (MINIPRESS) 1 MG capsule, Take 1 capsule (1 mg total) by mouth at bedtime., Disp: 90 capsule, Rfl: 0 .  rOPINIRole (REQUIP) 1 MG tablet, Take 1 tablet (1 mg total) by mouth at bedtime., Disp: 90 tablet, Rfl: 0 .  SUMAtriptan (IMITREX) 100 MG tablet, Take 1 tablet (100 mg total) by mouth every 2 (two) hours as needed for migraine. May repeat in 2 hours if headache persists or recurs., Disp: 10 tablet, Rfl: 0 .  tizanidine (ZANAFLEX) 2 MG capsule, Take 1 capsule (2 mg total) by mouth daily as needed for muscle spasms., Disp: 90 capsule, Rfl: 0 .  topiramate (TOPAMAX) 50 MG tablet, Take 1 tablet (50 mg total) by mouth 2 (two) times daily. Start at 50 at night and go up every 3 days on dose to max to 2 twice daily, Disp: 120 tablet, Rfl: 0 .  venlafaxine XR (EFFEXOR XR) 75 MG 24 hr capsule, Take 1 capsule (75 mg total) by mouth daily with breakfast. To be added to 150 mg to make it 225 mg,  Disp: 90 capsule, Rfl: 1 .  venlafaxine XR (EFFEXOR-XR) 150 MG 24 hr capsule, Take 1 capsule (150 mg total) by mouth daily with breakfast., Disp: 90 capsule, Rfl: 1  Allergies  Allergen Reactions  . Penicillins Anaphylaxis, Hives, Nausea And Vomiting and Swelling    Has patient had a PCN reaction causing immediate rash, facial/tongue/throat swelling, SOB or lightheadedness with hypotension: Yes Has patient had a PCN reaction causing severe rash involving mucus membranes or skin necrosis: Yes Has patient had a PCN reaction that required hospitalization No Has patient had a PCN reaction occurring within the last 10 years: Yes If all of the above answers are "NO", then may proceed with Cephalosporin use.   . Chlorhexidine Gluconate Itching and Rash  . Ciprofloxacin Hives  . Clindamycin/Lincomycin Hives  . Erythromycin Hives and Nausea And Vomiting  . Keflex [Cephalexin] Hives  . Lyrica [Pregabalin]     Dizziness, syncope  . Nitrofurantoin Monohyd Macro Hives and Nausea And Vomiting  . Other Hives  . Sulfa Antibiotics Hives and Nausea And Vomiting    Rapid heart rate  . Tetracyclines & Related Hives  . Adhesive [Tape] Rash  . Latex Rash  . Vancomycin Itching and Rash     ROS  Constitutional: Negative for fever , positive for mild  weight change.  Respiratory: Negative for cough and shortness of breath.   Cardiovascular: Negative for chest pain or palpitations.  Gastrointestinal: Negative for abdominal pain, no bowel changes.  Musculoskeletal: Negative for gait problem or joint swelling.  Skin: Negative for rash.  Neurological: Negative for dizziness or headache.  No other specific complaints in a complete review of systems (except as listed in HPI above).  Objective  Vitals:   02/22/19 1450  BP: 110/70  Pulse: (!) 115  Resp: 16  Temp: (!) 96.8 F (36 C)  TempSrc: Temporal  SpO2: 98%  Weight: 209 lb 3.2 oz (94.9 kg)  Height: 5' 2.25" (1.581 m)  Body mass index is  37.96 kg/m.  Physical Exam  Constitutional: Patient appears well-developed and well-nourished. Obesity  No distress.  HENT: Head: Normocephalic and atraumatic. Ears: B TMs ok, no erythema or effusion; Nose: Nose normal. Mouth/Throat: Oropharynx is clear and moist. No oropharyngeal exudate.  Eyes: Conjunctivae and EOM are normal. Pupils are equal, round, and reactive to light. No scleral icterus.  Neck: Normal range of motion. Neck supple. No JVD present. No thyromegaly present.  Cardiovascular: Normal rate, regular rhythm and normal heart sounds.  No murmur heard. No BLE edema. Pulmonary/Chest: Effort normal and breath sounds normal. No respiratory distress. Abdominal: Soft. Bowel sounds are normal, no distension. There is no tenderness. no masses Breast: no lumps or masses, no nipple discharge or rashes FEMALE GENITALIA:  Not done  RECTAL: not done Musculoskeletal: Normal range of motion, no joint effusions. No gross deformities, she has some pain on lumbar spine, no rashes Neurological: he is alert and oriented to person, place, and time. No cranial nerve deficit. Coordination, balance, strength, speech and gait are normal.  Skin: Skin is warm and dry. No rash noted. No erythema.  Psychiatric: Patient has a normal mood and affect. behavior is normal. Judgment and thought content normal.   Recent Results (from the past 2160 hour(s))  SARS Coronavirus 2 (Performed in [facility] lab)     Status: None   Collection Time: 01/31/19  8:23 AM   Specimen: Nasal Swab  Result Value Ref Range   SARS Coronavirus 2 NEGATIVE NEGATIVE    Comment: (NOTE) SARS-CoV-2 target nucleic acids are NOT DETECTED. The SARS-CoV-2 RNA is generally detectable in upper and lower respiratory specimens during the acute phase of infection. Negative results do not preclude SARS-CoV-2 infection, do not rule out co-infections with other pathogens, and should not be used as the sole basis for treatment or  other patient management decisions. Negative results must be combined with clinical observations, patient history, and epidemiological information. The expected result is Negative. Fact Sheet for Patients: SugarRoll.be Fact Sheet for Healthcare Providers: https://www.woods-mathews.com/ This test is not yet approved or cleared by the [place] FDA and  has been authorized for detection and/or diagnosis of SARS-CoV-2 by FDA under an Emergency Use Authorization (EUA). This EUA will remain  in effect (meaning this test can be used) for the duration of the COVID-19 declaration under Section 56 4(b)(1) of the Act, 21 U.S.C. section 360bbb-3(b)(1), unless the authorization is terminated or revoked sooner. Performed at [facility], [place] [address], [place], [place] [address]   Pregnancy, urine POC     Status: None   Collection Time: 02/03/19  7:42 AM  Result Value Ref Range   Preg Test, Ur NEGATIVE NEGATIVE    Comment:        THE SENSITIVITY OF THIS METHODOLOGY IS >24 mIU/mL      PHQ2/9: Depression screen [facility] 2/9 02/22/2019 02/22/2019 01/12/2019 09/20/2018 08/30/2018  Decreased Interest 1 0 _0 Down, Depressed, Hopeless 0 0 _1 PHQ - 2 Score 1 0 _2 Altered sleeping 1 0 _3 Tired, decreased energy 2 0 _4 Change in appetite 0 0 _5 Feeling bad or failure about yourself  0 0 0 0 1  Trouble concentrating 0 0 0 2 1  Moving slowly or fidgety/restless 0 0 0 1 0  Suicidal thoughts 0 0 0 0 0  PHQ-9 Score 4 0 7 12  10  Difficult doing work/chores Somewhat difficult - Somewhat difficult Very difficult Not difficult at all  Some recent data might be hidden   Positive   Fall Risk: Fall Risk  02/22/2019 01/12/2019 08/30/2018 08/24/2018 06/17/2018  Falls in the past year? 0 _0 Number falls in past yr: 0 _1 Comment - - - - -  Injury with Fall? 0 _2 Comment - - - - Twisted Left Knee and fell on Left Hip  Risk  for fall due to : - - History of fall(s);Impaired balance/gait History of fall(s);Impaired balance/gait Impaired balance/gait;History of fall(s)  Risk for fall due to: Comment - - - - Left Knee and Right Hip are weak  Follow up - - - - -     Functional Status Survey: Is the patient deaf or have difficulty hearing?: No Does the patient have difficulty seeing, even when wearing glasses/contacts?: No Does the patient have difficulty concentrating, remembering, or making decisions?: No Does the patient have difficulty walking or climbing stairs?: No Does the patient have difficulty dressing or bathing?: No Does the patient have difficulty doing errands alone such as visiting a doctor's office or shopping?: No   Assessment & Plan  1. Well woman exam  She will call to schedule mammogram, she is scheduled for colonoscopy, first one cancelled due to inability to keep laxatives down.   Discussed shingrix ( she will check with insurance ) and will have flu vaccine during her next visit in one month    -USPSTF grade A and B recommendations reviewed with patient; age-appropriate recommendations, preventive care, screening tests, etc discussed and encouraged; healthy living encouraged; see AVS for patient education given to patient -Discussed importance of 150 minutes of physical activity weekly, eat two servings of fish weekly, eat one serving of tree nuts ( cashews, pistachios, pecans, almonds.Marland Kitchen) every other day, eat 6 servings of fruit/vegetables daily and drink plenty of water and avoid sweet beverages.

## 2019-02-27 NOTE — Telephone Encounter (Signed)
Patient states she takes Topamax 50 mg 1 in the morning and 2 in the evening. Please send to [facility].

## 2019-02-28 ENCOUNTER — Encounter: Payer: Self-pay | Admitting: Psychiatry

## 2019-02-28 ENCOUNTER — Other Ambulatory Visit: Payer: Self-pay

## 2019-02-28 ENCOUNTER — Ambulatory Visit (INDEPENDENT_AMBULATORY_CARE_PROVIDER_SITE_OTHER): Payer: BC Managed Care – PPO | Admitting: Psychiatry

## 2019-02-28 DIAGNOSIS — F411 Generalized anxiety disorder: Secondary | ICD-10-CM | POA: Insufficient documentation

## 2019-02-28 DIAGNOSIS — F331 Major depressive disorder, recurrent, moderate: Secondary | ICD-10-CM | POA: Diagnosis not present

## 2019-02-28 DIAGNOSIS — F410 Panic disorder [episodic paroxysmal anxiety] without agoraphobia: Secondary | ICD-10-CM | POA: Diagnosis not present

## 2019-02-28 DIAGNOSIS — F5105 Insomnia due to other mental disorder: Secondary | ICD-10-CM | POA: Diagnosis not present

## 2019-02-28 MED ORDER — VENLAFAXINE HCL ER 150 MG PO CP24: 150.0000 mg | ORAL_CAPSULE | Freq: Every day | ORAL | 1 refills | Status: DC

## 2019-02-28 MED ORDER — PRAZOSIN HCL 1 MG PO CAPS: 1.0000 mg | ORAL_CAPSULE | Freq: Every day | ORAL | 1 refills | Status: DC

## 2019-02-28 NOTE — Progress Notes (Signed)
Virtual Visit via Video Note  I connected with Kelly [name] on 02/28/19 at 10:30 AM EDT by a video enabled telemedicine application and verified that I am speaking with the correct person using two identifiers.   I discussed the limitations of evaluation and management by telemedicine and the availability of in person appointments. The patient expressed understanding and agreed to proceed.   I discussed the assessment and treatment plan with the patient. The patient was provided an opportunity to ask questions and all were answered. The patient agreed with the plan and demonstrated an understanding of the instructions.   The patient was advised to call back or seek an in-person evaluation if the symptoms worsen or if the condition fails to improve as anticipated.  Raoul MD OP Progress Note  02/28/2019 1:49 PM FADIA WIERMAN  MRN:  [id number]  Chief Complaint:  Chief Complaint    Follow-up     HPI: Lakethia is a 53 year old Caucasian female who is employed, married, lives in [place], has a history of depression, anxiety, morbid obesity, gastroesophageal reflux disease, bulging lumbar disc, migraine headaches was evaluated by telemedicine today.  Patient today reports she has noticed some possible side effects to venlafaxine.  She reports she has been having some tingling in her arms as well as tingling around her lips since the dosage was increased.  She does not know if the venlafaxine is contributing to it or not.  She however agrees to reduce the dosage and monitor herself.  Patient reports her nightmares have also come back since she ran out of prazosin few days ago.  She does report some situational stresses of having to deal with a lot of documentation that needs to be filled about stopping child support for her adopted daughter who has graduated from school.  She reports she is happy that she can stop child support however it brought back a lot of anxiety while she was filling  out all those forms the past few days.  Patient denies any suicidality, homicidality or perceptual disturbances.  Patient continues to keep in touch with her therapist, she also has been journaling which helps her with her mood.  She denies any other concerns today.  Visit Diagnosis:    ICD-10-CM   1. MDD (major depressive disorder), recurrent episode, moderate (HCC)  F33.1 venlafaxine XR (EFFEXOR-XR) 150 MG 24 hr capsule  2. GAD (generalized anxiety disorder)  F41.1   3. Panic disorder  F41.0   4. Insomnia due to mental condition  F51.05 prazosin (MINIPRESS) 1 MG capsule    Past Psychiatric History: I have reviewed past psychiatric history from my progress note on 10/25/2018.  Past trials of medications like Paxil, trazodone, hydroxyzine.  Past Medical History:  Past Medical History:  Diagnosis Date  . Allergy   . Anxiety   . Depression   . GERD (gastroesophageal reflux disease)   . History of kidney stones   . Insomnia   . Intermittent low back pain   . Migraines   . Osteoarthritis   . Recurrent UTI   . Restless leg syndrome   . Sciatica of right side   . Symptomatic menopausal or female climacteric states   . Vertigo   . Vitamin D deficiency     Past Surgical History:  Procedure Laterality Date  . BACK SURGERY    . COLONOSCOPY WITH PROPOFOL N/A 02/03/2019   Procedure: COLONOSCOPY WITH PROPOFOL;  Surgeon: Jonathon Bellows, MD;  Location: [facility] END[facility]COPY;  Service: Gastroenterology;  Laterality:  N/A;  . DIAGNOSTIC LAPAROSCOPY    . DILATION AND CURETTAGE OF UTERUS    . KNEE ARTHROSCOPY WITH MEDIAL MENISECTOMY Left 01/14/2017   Procedure: KNEE ARTHROSCOPY WITH MEDIAL AND LATERAL MENISECTOMY;  Surgeon: Hessie Knows, MD;  Location: [facility] ORS;  Service: Orthopedics;  Laterality: Left;  Partial Knee menisectomy  . KNEE SURGERY Left 05/2011   Dr. Rudene Christians- Arthroscopic  . LUMBAR LAMINECTOMY/DECOMPRESSION MICRODISCECTOMY N/A 09/16/2016   Procedure: LUMBAR LAMINECTOMY/DECOMPRESSION  MICRODISCECTOMY 1 LEVEL L5-S1;  Surgeon: Meade Maw, MD;  Location: [facility] ORS;  Service: Neurosurgery;  Laterality: N/A;  . TONSILLECTOMY AND ADENOIDECTOMY    . URETHRAL STRICTURE DILATATION      Family Psychiatric History: I have reviewed family psychiatric history from my progress note on 10/25/2018.  Family History:  Family History  Problem Relation Age of Onset  . Cancer Paternal Grandmother   . Anxiety disorder Cousin   . Depression Cousin     Social History: I have reviewed social history from my progress note on 10/25/2018. Social History   Socioeconomic History  . Marital status: Married    Spouse name: Jenny Reichmann  . Number of children: 2  . Years of education: 79  . Highest education level: Associate degree: occupational, Hotel manager, or vocational program  Occupational History  . 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 Financial resource strain: Hard  . Food insecurity    Worry: Often true    Inability: Often true  . Transportation needs    Medical: No    Non-medical: Yes  Tobacco Use  . Smoking status: Former Smoker    Packs/day: 0.75    Years: 2.00    Pack years: 1.50    Types: Cigarettes    Start date: 07/06/1981    Quit date: 07/07/1983    Years since quitting: 35.6  . Smokeless tobacco: Never Used  Substance and Sexual Activity  . Alcohol use: No    Alcohol/week: 0.0 standard drinks  . Drug use: No  . Sexual activity: Yes    Partners: Male  Lifestyle  . Physical activity    Days per week: 5 days    Minutes per session: 30 min  . Stress: To some extent  Relationships  . Social connections    Talks on phone: More than three times a week    Gets together: Twice a week    Attends religious service: Never    Active member of club or organization: No    Attends meetings of clubs or organizations: Never    Relationship status: Married  Other Topics Concern  . Not on file  Social History Narrative   Married and husband has a lot of medical problems,  he is working again     Allergies:  Allergies  Allergen Reactions  . Penicillins Anaphylaxis, Hives, Nausea And Vomiting and Swelling    Has patient had a PCN reaction causing immediate rash, facial/tongue/throat swelling, SOB or lightheadedness with hypotension: Yes Has patient had a PCN reaction causing severe rash involving mucus membranes or skin necrosis: Yes Has patient had a PCN reaction that required hospitalization No Has patient had a PCN reaction occurring within the last 10 years: Yes If all of the above answers are "NO", then may proceed with Cephalosporin use.   . Chlorhexidine Gluconate Itching and Rash  . Ciprofloxacin Hives  . Clindamycin/Lincomycin Hives  . Erythromycin Hives and Nausea And Vomiting  . Keflex [Cephalexin] Hives  . Lyrica [Pregabalin]     Dizziness, syncope  . Nitrofurantoin  Monohyd Macro Hives and Nausea And Vomiting  . Other Hives  . Sulfa Antibiotics Hives and Nausea And Vomiting    Rapid heart rate  . Tetracyclines & Related Hives  . Adhesive [Tape] Rash  . Latex Rash  . Vancomycin Itching and Rash    Metabolic Disorder Labs: Lab Results  Component Value Date   HGBA1C 5.4 06/17/2018   MPG 108 06/17/2018   MPG 114 12/31/2016   No results found for: PROLACTIN Lab Results  Component Value Date   CHOL 160 06/17/2018   TRIG 119 06/17/2018   HDL 56 06/17/2018   CHOLHDL 2.9 06/17/2018   VLDL 22 12/31/2016   LDLCALC 82 06/17/2018   LDLCALC 64 12/31/2016   Lab Results  Component Value Date   TSH 2.56 12/31/2016   TSH 2.80 02/27/2016    Therapeutic Level Labs: No results found for: LITHIUM No results found for: VALPROATE No components found for:  CBMZ  Current Medications: Current Outpatient Medications  Medication Sig Dispense Refill  . fluticasone (FLONASE) 50 MCG/ACT nasal spray Place 2 sprays into both nostrils daily. 16 g 2  . GAVILYTE-N WITH FLAVOR PACK 420 g solution     . hydrOXYzine (ATARAX/VISTARIL) 25 MG tablet  Take 1 tablet (25 mg total) by mouth 2 (two) times daily as needed for anxiety. 180 tablet 1  . loratadine (CLARITIN) 10 MG tablet Take 1 tablet (10 mg total) by mouth daily. 90 tablet 3  . montelukast (SINGULAIR) 10 MG tablet Take 1 tablet (10 mg total) by mouth at bedtime. 90 tablet 1  . omeprazole (PRILOSEC) 40 MG capsule Take 1 capsule (40 mg total) by mouth every morning. 90 capsule 1  . prazosin (MINIPRESS) 1 MG capsule Take 1 capsule (1 mg total) by mouth at bedtime. 90 capsule 1  . rOPINIRole (REQUIP) 1 MG tablet Take 1 tablet (1 mg total) by mouth at bedtime. 90 tablet 0  . SUMAtriptan (IMITREX) 100 MG tablet Take 1 tablet (100 mg total) by mouth every 2 (two) hours as needed for migraine. May repeat in 2 hours if headache persists or recurs. 10 tablet 0  . tizanidine (ZANAFLEX) 2 MG capsule Take 1 capsule (2 mg total) by mouth daily as needed for muscle spasms. 90 capsule 0  . topiramate (TOPAMAX) 50 MG tablet Take 1-2 tablets (50-100 mg total) by mouth 2 (two) times daily. 270 tablet 0  . venlafaxine XR (EFFEXOR-XR) 150 MG 24 hr capsule Take 1 capsule (150 mg total) by mouth daily with breakfast. 90 capsule 1   No current facility-administered medications for this visit.      Musculoskeletal: Strength & Muscle Tone: UTA Gait & Station: normal Patient leans: N/A  Psychiatric Specialty Exam: Review of Systems  Psychiatric/Behavioral: The patient is nervous/anxious and has insomnia.   All other systems reviewed and are negative.   There were no vitals taken for this visit.There is no height or weight on file to calculate BMI.  General Appearance: Casual  Eye Contact:  Fair  Speech:  Clear and Coherent  Volume:  Normal  Mood:  Anxious  Affect:  Congruent  Thought Process:  Goal Directed and Descriptions of Associations: Intact  Orientation:  Full (Time, Place, and Person)  Thought Content: Logical   Suicidal Thoughts:  No  Homicidal Thoughts:  No  Memory:  Immediate;    Fair Recent;   Fair Remote;   Fair  Judgement:  Fair  Insight:  Fair  Psychomotor Activity:  Normal  Concentration:  Concentration: Fair and Attention Span: Fair  Recall:  [employer] of Knowledge: Fair  Language: Fair  Akathisia:  No  Handed:  Right  AIMS (if indicated): Denies tremors, rigidity  Assets:  Communication Skills Desire for Improvement Social Support  ADL's:  Intact  Cognition: WNL  Sleep:  Poor   Screenings: GAD-7     Office Visit from 02/22/2019 in [facility] Office Visit from 09/20/2018 in [facility] Office Visit from 08/30/2018 in [facility] Office Visit from 08/24/2018 in [facility] Office Visit from 06/17/2018 in [facility]  Total GAD-7 Score  1  11  10  10  5     PHQ2-9     Office Visit from 02/22/2019 in [facility] Office Visit from 01/12/2019 in [facility] Office Visit from 09/20/2018 in [facility] Office Visit from 08/30/2018 in [facility] Office Visit from 08/24/2018 in [facility]  PHQ-2 Total Score  1  2  3  3  3   PHQ-9 Total Score  4  7  12  10  10        Assessment and Plan: Kelly [name] is a 53 year old Caucasian female, married, employed, lives in [place], has a history of MDD, GAD, panic disorder, insomnia, GERD, arthritis, chronic pain was evaluated by telemedicine today.  Patient is biologically predisposed given her family history, history of trauma, multiple medical problems.  Patient also with psychosocial stressors of financial problems, chronic pain.  Patient with possible adverse side effects to venlafaxine.  We will continue to make medication readjustment.  Plan MDD-improving Reduce venlafaxine to 150 mg p.o. daily.  Discussed with patient to reach out to her PMD if tingling and numbness gets worse or continues  in spite of reducing the dosage of  venlafaxine. Continue psychotherapy sessions with therapist Marjie Skiff.   For GAD-improving Venlafaxine as prescribed Continue CBT  Panic disorder-improving Effexor as prescribed  Insomnia-improving Prazosin 1 mg p.o. nightly for nightmares Melatonin 3 to 5 mg p.o. nightly as needed  Follow-up in clinic in 1 month or sooner if needed.  September 22 at 10 AM  I have spent atleast 15 minutes non face to face with patient today. More than 50 % of the time was spent for psychoeducation and supportive psychotherapy and care coordination.  This note was generated in part or whole with voice recognition software. Voice recognition is usually quite accurate but there are transcription errors that can and very often do occur. I apologize for any typographical errors that were not detected and corrected.        Ursula Alert, MD 02/28/2019, 1:49 PM

## 2019-03-07 ENCOUNTER — Other Ambulatory Visit
Admission: RE | Admit: 2019-03-07 | Discharge: 2019-03-07 | Disposition: A | Discharge status: Home / Self Care | Payer: BC Managed Care – PPO | Source: Ambulatory Visit | Attending: Gastroenterology | Admitting: Gastroenterology

## 2019-03-07 ENCOUNTER — Other Ambulatory Visit: Payer: Self-pay

## 2019-03-07 DIAGNOSIS — K635 Polyp of colon: Secondary | ICD-10-CM | POA: Diagnosis not present

## 2019-03-07 DIAGNOSIS — Z20828 Contact with and (suspected) exposure to other viral communicable diseases: Secondary | ICD-10-CM | POA: Insufficient documentation

## 2019-03-07 DIAGNOSIS — K5790 Diverticulosis of intestine, part unspecified, without perforation or abscess without bleeding: Secondary | ICD-10-CM | POA: Insufficient documentation

## 2019-03-07 DIAGNOSIS — Z01812 Encounter for preprocedural laboratory examination: Secondary | ICD-10-CM | POA: Insufficient documentation

## 2019-03-07 LAB — SARS CORONAVIRUS 2 (TAT 6-24 HRS): SARS Coronavirus 2: NEGATIVE

## 2019-03-08 ENCOUNTER — Telehealth: Payer: Self-pay | Admitting: Gastroenterology

## 2019-03-08 NOTE — Telephone Encounter (Signed)
Pt left vm she needs instructions on her Mirilax prep for her procedure on Friday 03/10/19

## 2019-03-09 NOTE — Telephone Encounter (Signed)
Spoke with pt regarding her request for procedure prep instructions. Pt now states that she has found the prep instructions.

## 2019-03-10 ENCOUNTER — Encounter
Admission: RE | Disposition: A | Discharge status: Home / Self Care | Payer: Self-pay | Source: Home / Self Care | Attending: Gastroenterology

## 2019-03-10 ENCOUNTER — Ambulatory Visit: Payer: BC Managed Care – PPO | Admitting: Anesthesiology

## 2019-03-10 ENCOUNTER — Other Ambulatory Visit: Payer: Self-pay

## 2019-03-10 ENCOUNTER — Encounter: Payer: Self-pay | Admitting: Gastroenterology

## 2019-03-10 ENCOUNTER — Ambulatory Visit
Admission: RE | Admit: 2019-03-10 | Discharge: 2019-03-10 | Disposition: A | Discharge status: Home / Self Care | Payer: BC Managed Care – PPO | Attending: Gastroenterology | Admitting: Gastroenterology

## 2019-03-10 DIAGNOSIS — F329 Major depressive disorder, single episode, unspecified: Secondary | ICD-10-CM | POA: Insufficient documentation

## 2019-03-10 DIAGNOSIS — E669 Obesity, unspecified: Secondary | ICD-10-CM | POA: Insufficient documentation

## 2019-03-10 DIAGNOSIS — K5790 Diverticulosis of intestine, part unspecified, without perforation or abscess without bleeding: Secondary | ICD-10-CM | POA: Diagnosis not present

## 2019-03-10 DIAGNOSIS — Z87891 Personal history of nicotine dependence: Secondary | ICD-10-CM | POA: Diagnosis not present

## 2019-03-10 DIAGNOSIS — G43909 Migraine, unspecified, not intractable, without status migrainosus: Secondary | ICD-10-CM | POA: Diagnosis not present

## 2019-03-10 DIAGNOSIS — D126 Benign neoplasm of colon, unspecified: Secondary | ICD-10-CM | POA: Diagnosis not present

## 2019-03-10 DIAGNOSIS — Z79899 Other long term (current) drug therapy: Secondary | ICD-10-CM | POA: Diagnosis not present

## 2019-03-10 DIAGNOSIS — Z1211 Encounter for screening for malignant neoplasm of colon: Secondary | ICD-10-CM | POA: Insufficient documentation

## 2019-03-10 DIAGNOSIS — Z6838 Body mass index (BMI) 38.0-38.9, adult: Secondary | ICD-10-CM | POA: Diagnosis not present

## 2019-03-10 DIAGNOSIS — G2581 Restless legs syndrome: Secondary | ICD-10-CM | POA: Diagnosis not present

## 2019-03-10 DIAGNOSIS — D125 Benign neoplasm of sigmoid colon: Secondary | ICD-10-CM | POA: Insufficient documentation

## 2019-03-10 DIAGNOSIS — F419 Anxiety disorder, unspecified: Secondary | ICD-10-CM | POA: Insufficient documentation

## 2019-03-10 DIAGNOSIS — K5730 Diverticulosis of large intestine without perforation or abscess without bleeding: Secondary | ICD-10-CM | POA: Diagnosis not present

## 2019-03-10 DIAGNOSIS — K219 Gastro-esophageal reflux disease without esophagitis: Secondary | ICD-10-CM | POA: Diagnosis not present

## 2019-03-10 DIAGNOSIS — K635 Polyp of colon: Secondary | ICD-10-CM

## 2019-03-10 HISTORY — PX: COLONOSCOPY WITH PROPOFOL: SHX5780

## 2019-03-10 SURGERY — COLONOSCOPY WITH PROPOFOL
Anesthesia: General

## 2019-03-10 MED ORDER — LIDOCAINE HCL (PF) 2 % IJ SOLN
INTRAMUSCULAR | Status: AC
  Filled 2019-03-10: qty 10

## 2019-03-10 MED ORDER — LIDOCAINE HCL (CARDIAC) PF 100 MG/5ML IV SOSY
PREFILLED_SYRINGE | INTRAVENOUS | Status: DC | PRN
  Administered 2019-03-10: 40 mg via INTRAVENOUS

## 2019-03-10 MED ORDER — SODIUM CHLORIDE 0.9 % IV SOLN
INTRAVENOUS | Status: DC
  Administered 2019-03-10: 09:00:00 via INTRAVENOUS

## 2019-03-10 MED ORDER — PROPOFOL 10 MG/ML IV BOLUS
INTRAVENOUS | Status: DC | PRN
  Administered 2019-03-10: 50 mg via INTRAVENOUS
  Administered 2019-03-10: 20 mg via INTRAVENOUS
  Administered 2019-03-10: 30 mg via INTRAVENOUS

## 2019-03-10 MED ORDER — PROPOFOL 500 MG/50ML IV EMUL
INTRAVENOUS | Status: DC | PRN
  Administered 2019-03-10: 75 ug/kg/min via INTRAVENOUS

## 2019-03-10 MED ORDER — PROPOFOL 500 MG/50ML IV EMUL
INTRAVENOUS | Status: AC
  Filled 2019-03-10: qty 50

## 2019-03-10 NOTE — Anesthesia Postprocedure Evaluation (Signed)
Anesthesia Post Note  Patient: Kelly [name]  Procedure(s) Performed: COLONOSCOPY WITH PROPOFOL (N/A )  Patient location during evaluation: PACU Anesthesia Type: General Level of consciousness: awake and alert Pain management: pain level controlled Vital Signs Assessment: post-procedure vital signs reviewed and stable Respiratory status: spontaneous breathing, nonlabored ventilation and respiratory function stable Cardiovascular status: blood pressure returned to baseline and stable Postop Assessment: no apparent nausea or vomiting Anesthetic complications: no     Last Vitals:  Vitals:   03/10/19 1049 03/10/19 1050  BP: (!) 107/54 (!) 107/54  Pulse: 62 62  Resp: 18 18  Temp:    SpO2: 100% 100%    Last Pain:  Vitals:   03/10/19 1049  TempSrc:   PainSc: 0-No pain                 Durenda Hurt

## 2019-03-10 NOTE — Transfer of Care (Signed)
Immediate Anesthesia Transfer of Care Note  Patient: Kelly [name]  Procedure(s) Performed: COLONOSCOPY WITH PROPOFOL (N/A )  Patient Location: PACU  Anesthesia Type:General  Level of Consciousness: sedated  Airway & Oxygen Therapy: Patient Spontanous Breathing  Post-op Assessment: Report given to RN and Post -op Vital signs reviewed and stable  Post vital signs: Reviewed  Last Vitals:  Vitals Value Taken Time  BP    Temp    Pulse    Resp    SpO2      Last Pain:  Vitals:   03/10/19 0831  TempSrc: Tympanic         Complications: No apparent anesthesia complications

## 2019-03-10 NOTE — H&P (Signed)
Jonathon Bellows, MD [address] [place], [place], [place], [address] [address], [place], [place], [place], [address] Phone: [phone]  Fax: [phone]  Primary Care Physician:  Steele Sizer, MD   Pre-Procedure History & Physical: HPI:  Kelly [name] is a 53 y.o. female is here for an colonoscopy.   Past Medical History:  Diagnosis Date  . Allergy   . Anxiety   . Depression   . GERD (gastroesophageal reflux disease)   . History of kidney stones   . Insomnia   . Intermittent low back pain   . Migraines   . Osteoarthritis   . Recurrent UTI   . Restless leg syndrome   . Sciatica of right side   . Symptomatic menopausal or female climacteric states   . Vertigo   . Vitamin D deficiency     Past Surgical History:  Procedure Laterality Date  . BACK SURGERY    . COLONOSCOPY WITH PROPOFOL N/A 02/03/2019   Procedure: COLONOSCOPY WITH PROPOFOL;  Surgeon: Jonathon Bellows, MD;  Location: [facility] ENDOSCOPY;  Service: Gastroenterology;  Laterality: N/A;  . DIAGNOSTIC LAPAROSCOPY    . DILATION AND CURETTAGE OF UTERUS    . KNEE ARTHROSCOPY WITH MEDIAL MENISECTOMY Left 01/14/2017   Procedure: KNEE ARTHROSCOPY WITH MEDIAL AND LATERAL MENISECTOMY;  Surgeon: Hessie Knows, MD;  Location: [facility] ORS;  Service: Orthopedics;  Laterality: Left;  Partial Knee menisectomy  . KNEE SURGERY Left 05/2011   Dr. Rudene Christians- Arthroscopic  . LUMBAR LAMINECTOMY/DECOMPRESSION MICRODISCECTOMY N/A 09/16/2016   Procedure: LUMBAR LAMINECTOMY/DECOMPRESSION MICRODISCECTOMY 1 LEVEL L5-S1;  Surgeon: Meade Maw, MD;  Location: [facility] ORS;  Service: Neurosurgery;  Laterality: N/A;  . TONSILLECTOMY AND ADENOIDECTOMY    . URETHRAL STRICTURE DILATATION      Prior to Admission medications   Medication Sig Start Date End Date Taking? Authorizing Provider  fluticasone (FLONASE) 50 MCG/ACT nasal spray Place 2 sprays into both nostrils daily. 01/12/19  Yes Sowles, Drue Stager, MD  GAVILYTE-N WITH FLAVOR PACK 420 g  solution  01/24/19  Yes [provider]  hydrOXYzine (ATARAX/VISTARIL) 25 MG tablet Take 1 tablet (25 mg total) by mouth 2 (two) times daily as needed for anxiety. 12/22/18  Yes Ursula Alert, MD  loratadine (CLARITIN) 10 MG tablet Take 1 tablet (10 mg total) by mouth daily. 08/24/18  Yes Poulose, Bethel Born, NP  montelukast (SINGULAIR) 10 MG tablet Take 1 tablet (10 mg total) by mouth at bedtime. 01/12/19  Yes Sowles, Drue Stager, MD  omeprazole (PRILOSEC) 40 MG capsule Take 1 capsule (40 mg total) by mouth every morning. 01/12/19  Yes Sowles, Drue Stager, MD  prazosin (MINIPRESS) 1 MG capsule Take 1 capsule (1 mg total) by mouth at bedtime. 02/28/19  Yes Ursula Alert, MD  rOPINIRole (REQUIP) 1 MG tablet Take 1 tablet (1 mg total) by mouth at bedtime. 01/12/19  Yes Sowles, Drue Stager, MD  SUMAtriptan (IMITREX) 100 MG tablet Take 1 tablet (100 mg total) by mouth every 2 (two) hours as needed for migraine. May repeat in 2 hours if headache persists or recurs. 01/12/19  Yes Sowles, Drue Stager, MD  tizanidine (ZANAFLEX) 2 MG capsule Take 1 capsule (2 mg total) by mouth daily as needed for muscle spasms. 01/12/19  Yes Sowles, Drue Stager, MD  topiramate (TOPAMAX) 50 MG tablet Take 1-2 tablets (50-100 mg total) by mouth 2 (two) times daily. 02/27/19  Yes Sowles, Drue Stager, MD  venlafaxine XR (EFFEXOR-XR) 150 MG 24 hr capsule Take 1 capsule (150 mg total) by mouth daily with breakfast.  02/28/19  Yes Ursula Alert, MD    Allergies as of 02/08/2019 - Review Complete 02/03/2019  Allergen Reaction Noted  . Penicillins Anaphylaxis, Hives, Nausea And Vomiting, and Swelling 12/28/2014  . Chlorhexidine gluconate Itching and Rash 01/08/2017  . Ciprofloxacin Hives 12/28/2014  . Clindamycin/lincomycin Hives 09/26/2015  . Erythromycin Hives and Nausea And Vomiting 09/25/2015  . Keflex [cephalexin] Hives 12/28/2014  . Lyrica [pregabalin]  06/25/2017  . Nitrofurantoin monohyd macro Hives and Nausea And Vomiting 12/28/2014  . Other  Hives 07/17/2016  . Sulfa antibiotics Hives and Nausea And Vomiting 12/28/2014  . Tetracyclines & related Hives 07/17/2016  . Adhesive [tape] Rash 09/07/2016  . Latex Rash 09/07/2016  . Vancomycin Itching and Rash 09/17/2016    Family History  Problem Relation Age of Onset  . Cancer Paternal Grandmother   . Anxiety disorder Cousin   . Depression Cousin     Social History   Socioeconomic History  . Marital status: Married    Spouse name: Jenny Reichmann  . Number of children: 2  . Years of education: 36  . Highest education level: Associate degree: occupational, Hotel manager, or vocational program  Occupational History  . 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 Financial resource strain: Hard  . Food insecurity    Worry: Often true    Inability: Often true  . Transportation needs    Medical: No    Non-medical: Yes  Tobacco Use  . Smoking status: Former Smoker    Packs/day: 0.75    Years: 2.00    Pack years: 1.50    Types: Cigarettes    Start date: 07/06/1981    Quit date: 07/07/1983    Years since quitting: 35.6  . Smokeless tobacco: Never Used  Substance and Sexual Activity  . Alcohol use: No    Alcohol/week: 0.0 standard drinks  . Drug use: No  . Sexual activity: Yes    Partners: Male  Lifestyle  . Physical activity    Days per week: 5 days    Minutes per session: 30 min  . Stress: To some extent  Relationships  . Social connections    Talks on phone: More than three times a week    Gets together: Twice a week    Attends religious service: Never    Active member of club or organization: No    Attends meetings of clubs or organizations: Never    Relationship status: Married  . Intimate partner violence    Fear of current or ex partner: No    Emotionally abused: No    Physically abused: No    Forced sexual activity: No  Other Topics Concern  . Not on file  Social History Narrative   Married and husband has a lot of medical problems, he is working again      Review of Systems: See HPI, otherwise negative ROS  Physical Exam: BP 125/85   Pulse 96   Temp (!) 97.4 F (36.3 C) (Tympanic)   Resp 18   Ht 5\' 2"  (1.575 m)   Wt 94.8 kg   LMP 03/10/2019   BMI 38.23 kg/m  General:   Alert,  pleasant and cooperative in NAD Head:  Normocephalic and atraumatic. Neck:  Supple; no masses or thyromegaly. Lungs:  Clear throughout to auscultation, normal respiratory effort.    Heart:  +S1, +S2, Regular rate and rhythm, No edema. Abdomen:  Soft, nontender and nondistended. Normal bowel sounds, without guarding, and without rebound.   Neurologic:  Alert and  oriented x4;  grossly normal neurologically.  Impression/Plan: Kelly [name] is here for an colonoscopy to be performed for Screening colonoscopy average risk   Risks, benefits, limitations, and alternatives regarding  colonoscopy have been reviewed with the patient.  Questions have been answered.  All parties agreeable.   Jonathon Bellows, MD  03/10/2019, 9:47 AM

## 2019-03-10 NOTE — Op Note (Signed)
[facility] Patient Name: Kelly [name] Procedure Date: 03/10/2019 9:42 AM MRN: [id number] Account #: [id number] Date of Birth: Oct 29, 1965 Admit Type: Outpatient Age: 53 Room: [facility] ENDO ROOM 4 Gender: Female Note Status: Finalized Procedure:            Colonoscopy Indications:          Screening for colorectal malignant neoplasm Providers:            Jonathon Bellows MD, MD Referring MD:         Bethena Roys. Sowles, MD (Referring MD) Medicines:            Monitored Anesthesia Care Complications:        No immediate complications. Procedure:            Pre-Anesthesia Assessment:                       - Prior to the procedure, a History and Physical was                        performed, and patient medications, allergies and                        sensitivities were reviewed. The patient's tolerance of                        previous anesthesia was reviewed.                       - The risks and benefits of the procedure and the                        sedation options and risks were discussed with the                        patient. All questions were answered and informed                        consent was obtained.                       - ASA Grade Assessment: II - A patient with mild                        systemic disease.                       After obtaining informed consent, the colonoscope was                        passed under direct vision. Throughout the procedure,                        the patient's blood pressure, pulse, and oxygen                        saturations were monitored continuously. The                        Colonoscope was introduced through the anus and  advanced to the the cecum, identified by the                        appendiceal orifice. The colonoscopy was performed with                        ease. The patient tolerated the procedure well. The                        quality of the bowel  preparation was excellent. Findings:      The perianal and digital rectal examinations were normal.      Multiple small-mouthed diverticula were found in the left colon.      Two sessile polyps were found in the sigmoid colon. The polyps were 3 to       4 mm in size. These polyps were removed with a cold biopsy forceps.       Resection and retrieval were complete.      The exam was otherwise without abnormality on direct and retroflexion       views. Impression:           - Diverticulosis in the left colon.                       - Two 3 to 4 mm polyps in the sigmoid colon, removed                        with a cold biopsy forceps. Resected and retrieved.                       - The examination was otherwise normal on direct and                        retroflexion views. Recommendation:       - Discharge patient to home (with escort).                       - Resume previous diet.                       - Continue present medications.                       - Await pathology results.                       - Repeat colonoscopy for surveillance based on                        pathology results. Procedure Code(s):    --- Professional ---                       [phone], Colonoscopy, flexible; with biopsy, single or                        multiple Diagnosis Code(s):    --- Professional ---                       Z12.11, Encounter for screening for malignant neoplasm  of colon                       K63.5, Polyp of colon                       K57.30, Diverticulosis of large intestine without                        perforation or abscess without bleeding CPT copyright 2019 American Medical Association. All rights reserved. The codes documented in this report are preliminary and upon coder review may  be revised to meet current compliance requirements. Jonathon Bellows, MD Jonathon Bellows MD, MD 03/10/2019 10:16:26 AM This report has been signed electronically. Number of Addenda: 0 Note  Initiated On: 03/10/2019 9:42 AM Scope Withdrawal Time: 0 hours 17 minutes 2 seconds  Total Procedure Duration: 0 hours 20 minutes 46 seconds  Estimated Blood Loss: Estimated blood loss: none.      [facility]

## 2019-03-10 NOTE — Anesthesia Post-op Follow-up Note (Signed)
Anesthesia QCDR form completed.        

## 2019-03-10 NOTE — Anesthesia Preprocedure Evaluation (Addendum)
Anesthesia Evaluation  Patient identified by MRN, date of birth, ID band Patient awake    Reviewed: Allergy & Precautions, H&P , NPO status , Patient's Chart, lab work & pertinent test results  History of Anesthesia Complications Negative for: history of anesthetic complications  Airway Mallampati: II  TM Distance: >3 FB     Dental  (+) Teeth Intact   Pulmonary neg shortness of breath, neg COPD, former smoker,           Cardiovascular (-) angina(-) Past MI, (-) Cardiac Stents and (-) CABG negative cardio ROS       Neuro/Psych  Headaches, PSYCHIATRIC DISORDERS Anxiety Depression sciatica    GI/Hepatic Neg liver ROS, GERD  Controlled,  Endo/Other  negative endocrine ROS  Renal/GU negative Renal ROS  negative genitourinary   Musculoskeletal  (+) Arthritis ,   Abdominal   Peds  Hematology negative hematology ROS (+)   Anesthesia Other Findings Obese  Past Medical History: No date: Allergy No date: Anxiety No date: Depression No date: GERD (gastroesophageal reflux disease) No date: History of kidney stones No date: Insomnia No date: Intermittent low back pain No date: Migraines No date: Osteoarthritis No date: Recurrent UTI No date: Restless leg syndrome No date: Sciatica of right side No date: Symptomatic menopausal or female climacteric states No date: Vertigo No date: Vitamin D deficiency  Past Surgical History: No date: BACK SURGERY 02/03/2019: COLONOSCOPY WITH PROPOFOL; N/A     Comment:  Procedure: COLONOSCOPY WITH PROPOFOL;  Surgeon: Jonathon Bellows, MD;  Location: [facility] ENDOSCOPY;  Service:               Gastroenterology;  Laterality: N/A; No date: DIAGNOSTIC LAPAROSCOPY No date: DILATION AND CURETTAGE OF UTERUS 01/14/2017: KNEE ARTHROSCOPY WITH MEDIAL MENISECTOMY; Left     Comment:  Procedure: KNEE ARTHROSCOPY WITH MEDIAL AND LATERAL               MENISECTOMY;  Surgeon: Hessie Knows,  MD;  Location: [facility]              ORS;  Service: Orthopedics;  Laterality: Left;  Partial               Knee menisectomy 05/2011: KNEE SURGERY; Left     Comment:  Dr. Rudene Christians- Arthroscopic 09/16/2016: LUMBAR LAMINECTOMY/DECOMPRESSION MICRODISCECTOMY; N/A     Comment:  Procedure: LUMBAR LAMINECTOMY/DECOMPRESSION               MICRODISCECTOMY 1 LEVEL L5-S1;  Surgeon: Meade Maw, MD;  Location: [facility] ORS;  Service:               Neurosurgery;  Laterality: N/A; No date: TONSILLECTOMY AND ADENOIDECTOMY No date: URETHRAL STRICTURE DILATATION  BMI    Body Mass Index: 38.23 kg/m      Reproductive/Obstetrics negative OB ROS                            Anesthesia Physical Anesthesia Plan  ASA: II  Anesthesia Plan: General   Post-op Pain Management:    Induction:   PONV Risk Score and Plan: Propofol infusion and TIVA  Airway Management Planned: Natural Airway and Nasal Cannula  Additional Equipment:   Intra-op Plan:   Post-operative Plan:   Informed Consent: I have reviewed the patients History and Physical, chart, labs  and discussed the procedure including the risks, benefits and alternatives for the proposed anesthesia with the patient or authorized representative who has indicated his/her understanding and acceptance.     Dental Advisory Given  Plan Discussed with: Anesthesiologist and CRNA  Anesthesia Plan Comments:         Anesthesia Quick Evaluation

## 2019-03-14 LAB — SURGICAL PATHOLOGY

## 2019-03-23 ENCOUNTER — Ambulatory Visit (INDEPENDENT_AMBULATORY_CARE_PROVIDER_SITE_OTHER): Payer: BC Managed Care – PPO | Admitting: Family Medicine

## 2019-03-23 ENCOUNTER — Other Ambulatory Visit: Payer: Self-pay

## 2019-03-23 ENCOUNTER — Encounter: Payer: Self-pay | Admitting: Family Medicine

## 2019-03-23 DIAGNOSIS — R000 Tachycardia, unspecified: Secondary | ICD-10-CM

## 2019-03-23 DIAGNOSIS — E559 Vitamin D deficiency, unspecified: Secondary | ICD-10-CM

## 2019-03-23 DIAGNOSIS — G2581 Restless legs syndrome: Secondary | ICD-10-CM

## 2019-03-23 DIAGNOSIS — Z79899 Other long term (current) drug therapy: Secondary | ICD-10-CM

## 2019-03-23 DIAGNOSIS — K219 Gastro-esophageal reflux disease without esophagitis: Secondary | ICD-10-CM

## 2019-03-23 DIAGNOSIS — R739 Hyperglycemia, unspecified: Secondary | ICD-10-CM

## 2019-03-23 DIAGNOSIS — E8881 Metabolic syndrome: Secondary | ICD-10-CM

## 2019-03-23 DIAGNOSIS — M159 Polyosteoarthritis, unspecified: Secondary | ICD-10-CM

## 2019-03-23 DIAGNOSIS — M150 Primary generalized (osteo)arthritis: Secondary | ICD-10-CM

## 2019-03-23 DIAGNOSIS — F331 Major depressive disorder, recurrent, moderate: Secondary | ICD-10-CM

## 2019-03-23 DIAGNOSIS — Z1159 Encounter for screening for other viral diseases: Secondary | ICD-10-CM

## 2019-03-23 DIAGNOSIS — G43009 Migraine without aura, not intractable, without status migrainosus: Secondary | ICD-10-CM

## 2019-03-23 MED ORDER — TOPIRAMATE 100 MG PO TABS: 100.0000 mg | ORAL_TABLET | Freq: Every evening | ORAL | 1 refills | Status: DC

## 2019-03-23 MED ORDER — ROPINIROLE HCL 1 MG PO TABS: 1.0000 mg | ORAL_TABLET | Freq: Every day | ORAL | 1 refills | Status: DC

## 2019-03-23 NOTE — Progress Notes (Signed)
Name: Kelly [name]   MRN: [id number]    DOB: [birth year]   Date:03/23/2019       Progress Note  Subjective  Chief Complaint  Chief Complaint  Patient presents with  . Anxiety  . Depression  . Migraine  . Gastroesophageal Reflux    I connected with  [name]  on 03/23/19 at  9:00 AM EDT by a video enabled telemedicine application and verified that I am speaking with the correct person using two identifiers.  I discussed the limitations of evaluation and management by telemedicine and the availability of in person appointments. The patient expressed understanding and agreed to proceed. Staff also discussed with the patient that there may be a patient responsible charge related to this service. Patient Location: at home  Provider Location: [facility]   HPI  Migraine: she states she has not have a severe migraine for a long time,but it was worse a couple of weeks ago. She states since started Topamax she has not had any episodes of migraine, usually temporal and goes to opposite side, associated with photophobia, phonophobia, dizziness , and sometimes vomiting and scotomas.  She states Topamax is causing some tingling and numbness but otherwise able to tolerate medication, explained that it usually improves the longer she takes medication. She would like to try just taking 100 mg at night to see if tingling improves   MDD: seeing Dr. Shea Evans and she is starting to feel better, phq 9 was normal, only positive for problems sleeping but she states also better, very seldom has problems sleeping. Compliant with medications  Metabolic Syndrome: we will recheck A1C, she denies polyphagia, polydipsia or polyuria. A1C was 5.8% a couple of weeks ago  Tachycardia: she has episodes of palpitation, and heart rate was up in the past we will check CBC, comp panel and TSH when she returns for nurse visit next week  GERD: under control with medication, she has refills at  pharmacy  OA: she states her legs aches more when weather changes, she asked if secondary to RLS. Explained likely from OA, advised to try adding Tylenol 1 g at night when the weather is damp or cold   Patient Active Problem List   Diagnosis Date Noted  . Encounter for screening colonoscopy   . Polyp of sigmoid colon   . GAD (generalized anxiety disorder) 02/28/2019  . Panic disorder 02/28/2019  . Insomnia due to mental condition 02/28/2019  . Morbid obesity ([place]) 07/26/2017  . Spondylolisthesis of lumbosacral region 02/11/2016  . Hematuria 04/09/2015  . Hyperglycemia 04/06/2015  . Right lumbar radiculitis 12/28/2014  . MDD (major depressive disorder), recurrent episode, moderate ([place]) 12/27/2014  . Gastro-esophageal reflux disease without esophagitis 12/27/2014  . Bulge of lumbar disc without myelopathy 12/27/2014  . Dysmetabolic syndrome [id number]  . Migraine without aura and responsive to treatment 12/27/2014  . Osteoarthrosis 12/27/2014  . Obesity (BMI 30-39.9) 12/27/2014  . Restless leg 12/27/2014  . Allergic rhinitis, seasonal 12/27/2014    Past Surgical History:  Procedure Laterality Date  . BACK SURGERY    . COLONOSCOPY WITH PROPOFOL N/A 02/03/2019   Procedure: COLONOSCOPY WITH PROPOFOL;  Surgeon: Jonathon Bellows, MD;  Location: [facility] ENDOSCOPY;  Service: Gastroenterology;  Laterality: N/A;  . COLONOSCOPY WITH PROPOFOL N/A 03/10/2019   Procedure: COLONOSCOPY WITH PROPOFOL;  Surgeon: Jonathon Bellows, MD;  Location: [facility] ENDOSCOPY;  Service: Gastroenterology;  Laterality: N/A;  . DIAGNOSTIC LAPAROSCOPY    . DILATION AND CURETTAGE OF UTERUS    .  KNEE ARTHROSCOPY WITH MEDIAL MENISECTOMY Left 01/14/2017   Procedure: KNEE ARTHROSCOPY WITH MEDIAL AND LATERAL MENISECTOMY;  Surgeon: Hessie Knows, MD;  Location: [facility] ORS;  Service: Orthopedics;  Laterality: Left;  Partial Knee menisectomy  . KNEE SURGERY Left 05/2011   Dr. Rudene Christians- Arthroscopic  . LUMBAR LAMINECTOMY/DECOMPRESSION  MICRODISCECTOMY N/A 09/16/2016   Procedure: LUMBAR LAMINECTOMY/DECOMPRESSION MICRODISCECTOMY 1 LEVEL L5-S1;  Surgeon: Meade Maw, MD;  Location: [facility] ORS;  Service: Neurosurgery;  Laterality: N/A;  . TONSILLECTOMY AND ADENOIDECTOMY    . URETHRAL STRICTURE DILATATION      Family History  Problem Relation Age of Onset  . Cancer Paternal Grandmother   . Anxiety disorder Cousin   . Depression Cousin     Social History   Socioeconomic History  . Marital status: Married    Spouse name: Jenny Reichmann  . Number of children: 2  . Years of education: 38  . Highest education level: Associate degree: occupational, Hotel manager, or vocational program  Occupational History  . 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 Financial resource strain: Hard  . Food insecurity    Worry: Often true    Inability: Often true  . Transportation needs    Medical: No    Non-medical: Yes  Tobacco Use  . Smoking status: Former Smoker    Packs/day: 0.75    Years: 2.00    Pack years: 1.50    Types: Cigarettes    Start date: 07/06/1981    Quit date: 07/07/1983    Years since quitting: 35.7  . Smokeless tobacco: Never Used  Substance and Sexual Activity  . Alcohol use: No    Alcohol/week: 0.0 standard drinks  . Drug use: No  . Sexual activity: Yes    Partners: Male  Lifestyle  . Physical activity    Days per week: 5 days    Minutes per session: 30 min  . Stress: To some extent  Relationships  . Social connections    Talks on phone: More than three times a week    Gets together: Twice a week    Attends religious service: Never    Active member of club or organization: No    Attends meetings of clubs or organizations: Never    Relationship status: Married  . Intimate partner violence    Fear of current or ex partner: No    Emotionally abused: No    Physically abused: No    Forced sexual activity: No  Other Topics Concern  . Not on file  Social History Narrative   Married and husband has a lot of  medical problems, he is working again      Current Outpatient Medications:  .  fluticasone (FLONASE) 50 MCG/ACT nasal spray, Place 2 sprays into both nostrils daily., Disp: 16 g, Rfl: 2 .  hydrOXYzine (ATARAX/VISTARIL) 25 MG tablet, Take 1 tablet (25 mg total) by mouth 2 (two) times daily as needed for anxiety., Disp: 180 tablet, Rfl: 1 .  loratadine (CLARITIN) 10 MG tablet, Take 1 tablet (10 mg total) by mouth daily., Disp: 90 tablet, Rfl: 3 .  montelukast (SINGULAIR) 10 MG tablet, Take 1 tablet (10 mg total) by mouth at bedtime., Disp: 90 tablet, Rfl: 1 .  omeprazole (PRILOSEC) 40 MG capsule, Take 1 capsule (40 mg total) by mouth every morning., Disp: 90 capsule, Rfl: 1 .  prazosin (MINIPRESS) 1 MG capsule, Take 1 capsule (1 mg total) by mouth at bedtime., Disp: 90 capsule, Rfl: 1 .  rOPINIRole (REQUIP) 1 MG tablet,  Take 1 tablet (1 mg total) by mouth at bedtime., Disp: 90 tablet, Rfl: 0 .  SUMAtriptan (IMITREX) 100 MG tablet, Take 1 tablet (100 mg total) by mouth every 2 (two) hours as needed for migraine. May repeat in 2 hours if headache persists or recurs., Disp: 10 tablet, Rfl: 0 .  tizanidine (ZANAFLEX) 2 MG capsule, Take 1 capsule (2 mg total) by mouth daily as needed for muscle spasms., Disp: 90 capsule, Rfl: 0 .  topiramate (TOPAMAX) 50 MG tablet, Take 1-2 tablets (50-100 mg total) by mouth 2 (two) times daily., Disp: 270 tablet, Rfl: 0 .  venlafaxine XR (EFFEXOR-XR) 150 MG 24 hr capsule, Take 1 capsule (150 mg total) by mouth daily with breakfast., Disp: 90 capsule, Rfl: 1 .  GAVILYTE-N WITH FLAVOR PACK 420 g solution, , Disp: , Rfl:   Allergies  Allergen Reactions  . Penicillins Anaphylaxis, Hives, Nausea And Vomiting and Swelling    Has patient had a PCN reaction causing immediate rash, facial/tongue/throat swelling, SOB or lightheadedness with hypotension: Yes Has patient had a PCN reaction causing severe rash involving mucus membranes or skin necrosis: Yes Has patient had a PCN  reaction that required hospitalization No Has patient had a PCN reaction occurring within the last 10 years: Yes If all of the above answers are "NO", then may proceed with Cephalosporin use.   . Chlorhexidine Gluconate Itching and Rash  . Ciprofloxacin Hives  . Clindamycin/Lincomycin Hives  . Erythromycin Hives and Nausea And Vomiting  . Keflex [Cephalexin] Hives  . Lyrica [Pregabalin]     Dizziness, syncope  . Nitrofurantoin Monohyd Macro Hives and Nausea And Vomiting  . Other Hives  . Sulfa Antibiotics Hives and Nausea And Vomiting    Rapid heart rate  . Tetracyclines & Related Hives  . Adhesive [Tape] Rash  . Latex Rash  . Vancomycin Itching and Rash    I personally reviewed active problem list, medication list, allergies, family history, social history, health maintenance with the patient/caregiver today.   ROS  Ten systems reviewed and is negative except as mentioned in HPI   Objective  Virtual encounter, vitals not obtained.  There is no height or weight on file to calculate BMI.  Physical Exam  Awake, alert and oriented   PHQ2/9: Depression screen [facility] 2/9 03/23/2019 02/22/2019 02/22/2019 01/12/2019 09/20/2018  Decreased Interest 0 1 0 1 2  Down, Depressed, Hopeless 0 0 0 1 1  PHQ - 2 Score 0 1 0 2 3  Altered sleeping 1 1 0 2 3  Tired, decreased energy 1 2 0 2 2  Change in appetite 0 0 0 1 1  Feeling bad or failure about yourself  0 0 0 0 0  Trouble concentrating 0 0 0 0 2  Moving slowly or fidgety/restless 0 0 0 0 1  Suicidal thoughts 0 0 0 0 0  PHQ-9 Score 2 4 0 7 12  Difficult doing work/chores Not difficult at all Somewhat difficult - Somewhat difficult Very difficult  Some recent data might be hidden   PHQ-2/9 Result is negative.    Fall Risk: Fall Risk  03/23/2019 02/22/2019 01/12/2019 08/30/2018 08/24/2018  Falls in the past year? 0 0 1 1 1   Number falls in past yr: 0 0 1 1 1   Comment - - - - -  Injury with Fall? 0 0 1 1 1   Comment - - - - -  Risk for  fall due to : - - - History of fall(s);Impaired balance/gait  History of fall(s);Impaired balance/gait  Risk for fall due to: Comment - - - - -  Follow up - - - - -     Assessment & Plan  1. Migraine without aura and responsive to treatment  - topiramate (TOPAMAX) 100 MG tablet; Take 1 tablet (100 mg total) by mouth every evening.  Dispense: 90 tablet; Refill: 1  2. Dysmetabolic syndrome  - Hemoglobin A1c  3. Gastro-esophageal reflux disease without esophagitis  Continue medication  4. Depression, major, recurrent, moderate ([place])  Continue follow up with Dr. Shea Evans   5. Restless leg  - rOPINIRole (REQUIP) 1 MG tablet; Take 1 tablet (1 mg total) by mouth at bedtime.  Dispense: 90 tablet; Refill: 1  6. Primary osteoarthritis involving multiple joints  She has seen Dr. Holley Raring in the past and will discuss hip pain with him   7. Hyperglycemia  - Hemoglobin A1c  8. Vitamin D deficiency  - VITAMIN D 25 Hydroxy (Vit-D Deficiency, Fractures)  9. Need for hepatitis C screening test  - Hepatitis C antibody  10. Long-term use of high-risk medication  - CBC with Differential/Platelet - COMPLETE METABOLIC PANEL WITH GFR  11. Tachycardia  With occasional palpitation  - TSH  I discussed the assessment and treatment plan with the patient. The patient was provided an opportunity to ask questions and all were answered. The patient agreed with the plan and demonstrated an understanding of the instructions.  The patient was advised to call back or seek an in-person evaluation if the symptoms worsen or if the condition fails to improve as anticipated.  I provided 25  minutes of non-face-to-face time during this encounter.

## 2019-03-26 ENCOUNTER — Encounter: Payer: Self-pay | Admitting: Gastroenterology

## 2019-03-28 ENCOUNTER — Other Ambulatory Visit: Payer: Self-pay

## 2019-03-28 ENCOUNTER — Ambulatory Visit (INDEPENDENT_AMBULATORY_CARE_PROVIDER_SITE_OTHER): Payer: BC Managed Care – PPO | Admitting: Psychiatry

## 2019-03-28 ENCOUNTER — Ambulatory Visit: Payer: Self-pay | Admitting: *Deleted

## 2019-03-28 ENCOUNTER — Encounter: Payer: Self-pay | Admitting: Psychiatry

## 2019-03-28 DIAGNOSIS — F410 Panic disorder [episodic paroxysmal anxiety] without agoraphobia: Secondary | ICD-10-CM

## 2019-03-28 DIAGNOSIS — F411 Generalized anxiety disorder: Secondary | ICD-10-CM

## 2019-03-28 DIAGNOSIS — F5105 Insomnia due to other mental disorder: Secondary | ICD-10-CM | POA: Diagnosis not present

## 2019-03-28 DIAGNOSIS — F331 Major depressive disorder, recurrent, moderate: Secondary | ICD-10-CM | POA: Diagnosis not present

## 2019-03-28 NOTE — Progress Notes (Signed)
Virtual Visit via Video Note  I connected with Kelly [name] on 03/28/19 at 10:00 AM EDT by a video enabled telemedicine application and verified that I am speaking with the correct person using two identifiers.   I discussed the limitations of evaluation and management by telemedicine and the availability of in person appointments. The patient expressed understanding and agreed to proceed.   I discussed the assessment and treatment plan with the patient. The patient was provided an opportunity to ask questions and all were answered. The patient agreed with the plan and demonstrated an understanding of the instructions.   The patient was advised to call back or seek an in-person evaluation if the symptoms worsen or if the condition fails to improve as anticipated.   [place] MD OP Progress Note  03/28/2019 5:46 PM [name]  MRN:  [id number]  Chief Complaint:  Chief Complaint    Follow-up     HPI: Kelly [name] is a 53 year old Caucasian female who is married, lives in [place], has a history of depression, anxiety, gastroesophageal reflux disease, bulging lumbar disc, migraine headaches was evaluated by telemedicine today.  Patient today reports she is currently making progress on the current medication regimen.  She reports she is mildly anxious about finding the right job since she completed billing and coding course.  She is also anxious about the current pandemic.  She is also waiting to hear back from the court about the child support problem that she was working on.  She however reports the venlafaxine does help her a lot.  She also continues to work with her therapist on a regular basis which is helpful.  She reports she continues to struggle with some sleep problems on and off.  She however has not been taking melatonin every day.  She is willing to start taking it daily.  She is currently working on sleep hygiene techniques which has helped.  She denies any current nightmares  and is currently on prazosin.  She reports she continues to have some tingling and numbness around her lips and her arms which did not go away with reducing the venlafaxine.  She reports she was told by her primary care provider that it may have been the Topamax.  She is currently on a lower dosage which has helped some.  She however is scheduled to see Dr. Holley Raring her pain provider soon so that she can discuss pain management options since she struggles with arthritis.  Patient denies any suicidality, homicidality or perceptual disturbances.  Patient denies any other concerns today. Visit Diagnosis:    ICD-10-CM   1. MDD (major depressive disorder), recurrent episode, moderate (HCC)  F33.1   2. GAD (generalized anxiety disorder)  F41.1   3. Panic disorder  F41.0   4. Insomnia due to mental condition  F51.05     Past Psychiatric History: I have reviewed past psychiatric history from my progress note on 10/25/2018.  Past trials of medications like Paxil, trazodone, hydroxyzine  Past Medical History:  Past Medical History:  Diagnosis Date  . Allergy   . Anxiety   . Depression   . GERD (gastroesophageal reflux disease)   . History of kidney stones   . Insomnia   . Intermittent low back pain   . Migraines   . Osteoarthritis   . Recurrent UTI   . Restless leg syndrome   . Sciatica of right side   . Symptomatic menopausal or female climacteric states   . Vertigo   .  Vitamin D deficiency     Past Surgical History:  Procedure Laterality Date  . BACK SURGERY    . COLONOSCOPY WITH PROPOFOL N/A 02/03/2019   Procedure: COLONOSCOPY WITH PROPOFOL;  Surgeon: Jonathon Bellows, MD;  Location: [facility] ENDOSCOPY;  Service: Gastroenterology;  Laterality: N/A;  . COLONOSCOPY WITH PROPOFOL N/A 03/10/2019   Procedure: COLONOSCOPY WITH PROPOFOL;  Surgeon: Jonathon Bellows, MD;  Location: [facility] ENDOSCOPY;  Service: Gastroenterology;  Laterality: N/A;  . DIAGNOSTIC LAPAROSCOPY    . DILATION AND CURETTAGE OF UTERUS     . KNEE ARTHROSCOPY WITH MEDIAL MENISECTOMY Left 01/14/2017   Procedure: KNEE ARTHROSCOPY WITH MEDIAL AND LATERAL MENISECTOMY;  Surgeon: Hessie Knows, MD;  Location: [facility] ORS;  Service: Orthopedics;  Laterality: Left;  Partial Knee menisectomy  . KNEE SURGERY Left 05/2011   Dr. Rudene Christians- Arthroscopic  . LUMBAR LAMINECTOMY/DECOMPRESSION MICRODISCECTOMY N/A 09/16/2016   Procedure: LUMBAR LAMINECTOMY/DECOMPRESSION MICRODISCECTOMY 1 LEVEL L5-S1;  Surgeon: Meade Maw, MD;  Location: [facility] ORS;  Service: Neurosurgery;  Laterality: N/A;  . TONSILLECTOMY AND ADENOIDECTOMY    . URETHRAL STRICTURE DILATATION      Family Psychiatric History: I have reviewed family psychiatric history from my progress note on 10/25/2018  Family History:  Family History  Problem Relation Age of Onset  . Cancer Paternal Grandmother   . Anxiety disorder Cousin   . Depression Cousin     Social History: I have reviewed social history from my progress note on 10/25/2018 Social History   Socioeconomic History  . Marital status: Married    Spouse name: Jenny Reichmann  . Number of children: 2  . Years of education: 61  . Highest education level: Associate degree: occupational, Hotel manager, or vocational program  Occupational History  . 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 Financial resource strain: Hard  . Food insecurity    Worry: Often true    Inability: Often true  . Transportation needs    Medical: No    Non-medical: Yes  Tobacco Use  . Smoking status: Former Smoker    Packs/day: 0.75    Years: 2.00    Pack years: 1.50    Types: Cigarettes    Start date: 07/06/1981    Quit date: 07/07/1983    Years since quitting: 35.7  . Smokeless tobacco: Never Used  Substance and Sexual Activity  . Alcohol use: No    Alcohol/week: 0.0 standard drinks  . Drug use: No  . Sexual activity: Yes    Partners: Male  Lifestyle  . Physical activity    Days per week: 5 days    Minutes per session: 30 min  . Stress: To some  extent  Relationships  . Social connections    Talks on phone: More than three times a week    Gets together: Twice a week    Attends religious service: Never    Active member of club or organization: No    Attends meetings of clubs or organizations: Never    Relationship status: Married  Other Topics Concern  . Not on file  Social History Narrative   Married and husband has a lot of medical problems, he is working again     Allergies:  Allergies  Allergen Reactions  . Penicillins Anaphylaxis, Hives, Nausea And Vomiting and Swelling    Has patient had a PCN reaction causing immediate rash, facial/tongue/throat swelling, SOB or lightheadedness with hypotension: Yes Has patient had a PCN reaction causing severe rash involving mucus membranes or skin necrosis: Yes Has patient had a  PCN reaction that required hospitalization No Has patient had a PCN reaction occurring within the last 10 years: Yes If all of the above answers are "NO", then may proceed with Cephalosporin use.   . Chlorhexidine Gluconate Itching and Rash  . Ciprofloxacin Hives  . Clindamycin/Lincomycin Hives  . Erythromycin Hives and Nausea And Vomiting  . Keflex [Cephalexin] Hives  . Lyrica [Pregabalin]     Dizziness, syncope  . Nitrofurantoin Monohyd Macro Hives and Nausea And Vomiting  . Other Hives  . Sulfa Antibiotics Hives and Nausea And Vomiting    Rapid heart rate  . Tetracyclines & Related Hives  . Adhesive [Tape] Rash  . Latex Rash  . Vancomycin Itching and Rash    Metabolic Disorder Labs: Lab Results  Component Value Date   HGBA1C 5.4 06/17/2018   MPG 108 06/17/2018   MPG 114 12/31/2016   No results found for: PROLACTIN Lab Results  Component Value Date   CHOL 160 06/17/2018   TRIG 119 06/17/2018   HDL 56 06/17/2018   CHOLHDL 2.9 06/17/2018   VLDL 22 12/31/2016   LDLCALC 82 06/17/2018   LDLCALC 64 12/31/2016   Lab Results  Component Value Date   TSH 2.56 12/31/2016   TSH 2.80  02/27/2016    Therapeutic Level Labs: No results found for: LITHIUM No results found for: VALPROATE No components found for:  CBMZ  Current Medications: Current Outpatient Medications  Medication Sig Dispense Refill  . fluticasone (FLONASE) 50 MCG/ACT nasal spray Place 2 sprays into both nostrils daily. 16 g 2  . hydrOXYzine (ATARAX/VISTARIL) 25 MG tablet Take 1 tablet (25 mg total) by mouth 2 (two) times daily as needed for anxiety. 180 tablet 1  . loratadine (CLARITIN) 10 MG tablet Take 1 tablet (10 mg total) by mouth daily. 90 tablet 3  . montelukast (SINGULAIR) 10 MG tablet Take 1 tablet (10 mg total) by mouth at bedtime. 90 tablet 1  . omeprazole (PRILOSEC) 40 MG capsule Take 1 capsule (40 mg total) by mouth every morning. 90 capsule 1  . prazosin (MINIPRESS) 1 MG capsule Take 1 capsule (1 mg total) by mouth at bedtime. 90 capsule 1  . rOPINIRole (REQUIP) 1 MG tablet Take 1 tablet (1 mg total) by mouth at bedtime. 90 tablet 1  . SUMAtriptan (IMITREX) 100 MG tablet Take 1 tablet (100 mg total) by mouth every 2 (two) hours as needed for migraine. May repeat in 2 hours if headache persists or recurs. 10 tablet 0  . tizanidine (ZANAFLEX) 2 MG capsule Take 1 capsule (2 mg total) by mouth daily as needed for muscle spasms. 90 capsule 0  . topiramate (TOPAMAX) 100 MG tablet Take 1 tablet (100 mg total) by mouth every evening. 90 tablet 1  . venlafaxine XR (EFFEXOR-XR) 150 MG 24 hr capsule Take 1 capsule (150 mg total) by mouth daily with breakfast. 90 capsule 1   No current facility-administered medications for this visit.      Musculoskeletal: Strength & Muscle Tone: UTA Gait & Station: Observed as seated Patient leans: N/A  Psychiatric Specialty Exam: Review of Systems  Psychiatric/Behavioral: The patient is nervous/anxious.   All other systems reviewed and are negative.   Last menstrual period 03/10/2019.There is no height or weight on file to calculate BMI.  General  Appearance: Casual  Eye Contact:  Fair  Speech:  Clear and Coherent  Volume:  Normal  Mood:  Anxious  Affect:  Appropriate  Thought Process:  Goal Directed and Descriptions of  Associations: Intact  Orientation:  Full (Time, Place, and Person)  Thought Content: Logical   Suicidal Thoughts:  No  Homicidal Thoughts:  No  Memory:  Immediate;   Fair Recent;   Fair Remote;   Fair  Judgement:  Fair  Insight:  Fair  Psychomotor Activity:  Normal  Concentration:  Concentration: Fair and Attention Span: Fair  Recall:  [employer] of Knowledge: Fair  Language: Fair  Akathisia:  No  Handed:  Right  AIMS (if indicated):denies tremors, rigidity  Assets:  Communication Skills Desire for [place] Talents/Skills  ADL's:  Intact  Cognition: WNL  Sleep:  restless   Screenings: GAD-7     Office Visit from 03/23/2019 in [facility] Office Visit from 02/22/2019 in [facility] Office Visit from 09/20/2018 in [facility] Office Visit from 08/30/2018 in [facility] Office Visit from 08/24/2018 in [facility]  Total GAD-7 Score  1  1  11  10  10     PHQ2-9     Office Visit from 03/23/2019 in [facility] Office Visit from 02/22/2019 in [facility] Office Visit from 01/12/2019 in [facility] Office Visit from 09/20/2018 in [facility] Office Visit from 08/30/2018 in [facility]  PHQ-2 Total Score  0  1  2  3  3   PHQ-9 Total Score  2  4  7  12  10        Assessment and Plan: Nury is a 53 year old Caucasian female, married, employed, lives in [place], has a history of MDD, GAD, panic disorder, insomnia, GERD, arthritis, chronic pain was evaluated by telemedicine today.  Patient is biologically predisposed given her family history, history of trauma, multiple medical problems.   She also has psychosocial stressors of financial problems, chronic pain.  Patient is currently making progress on the current medication regimen.  She does struggle with sleep, discussed medication readjustment as noted below.  Plan MDD-stable Venlafaxine 150 mg p.o. daily Continue psychotherapy sessions with therapist Marjie Skiff  GAD-stable Venlafaxine as prescribed  Panic disorder-improving Venlafaxine as prescribed  Insomnia-restless Prazosin 1 mg p.o. nightly for nightmares Discussed with patient to schedule melatonin 3 to 5 mg p.o. nightly every night. Also discussed sleep hygiene techniques.  Follow-up in clinic in 6 weeks or sooner if needed.  November 5 at 10 AM  I have spent atleast 15 minutes non face to face with patient today. More than 50 % of the time was spent for psychoeducation and supportive psychotherapy and care coordination. This note was generated in part or whole with voice recognition software. Voice recognition is usually quite accurate but there are transcription errors that can and very often do occur. I apologize for any typographical errors that were not detected and corrected.      Ursula Alert, MD 03/28/2019, 5:46 PM

## 2019-03-28 NOTE — Telephone Encounter (Signed)
I referenced the [employer] web site for the answer to if it was ok to be around a baby after receiving the Shingrix vaccine.   It is ok because it's an inactivated version of the vaccine so it's safe to be around babies and unvaccinated children. 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   Also ok after receiving the flu vaccine too.  She thanked me for my help.   She is coming in Friday for both of these vaccines.   Reason for Disposition . Caller has medication question only, adult not sick, and triager answers question  Answer Assessment - Initial Assessment Questions 1.   NAME of MEDICATION: "What medicine are you calling about?"     The flu shot and Shingles shot. 2.   QUESTION: "What is your question?"     Can I be around a 9 month baby after getting the flu and Shingles shots? 3.   PRESCRIBING HCP: "Who prescribed it?" Reason: if prescribed by specialist, call should be referred to that group.     I'm coming in Friday for the shots.    4. SYMPTOMS: "Do you have any symptoms?"     *No Answer* 5. SEVERITY: If symptoms are present, ask "Are they mild, moderate or severe?"     *No Answer* 6.  PREGNANCY:  "Is there any chance that you are pregnant?" "When was your last menstrual period?"     *No Answer*  Protocols used: MEDICATION QUESTION CALL-A-AH

## 2019-03-31 ENCOUNTER — Ambulatory Visit (INDEPENDENT_AMBULATORY_CARE_PROVIDER_SITE_OTHER): Payer: BC Managed Care – PPO

## 2019-03-31 ENCOUNTER — Other Ambulatory Visit: Payer: Self-pay

## 2019-03-31 DIAGNOSIS — Z23 Encounter for immunization: Secondary | ICD-10-CM | POA: Diagnosis not present

## 2019-03-31 DIAGNOSIS — E559 Vitamin D deficiency, unspecified: Secondary | ICD-10-CM | POA: Diagnosis not present

## 2019-03-31 DIAGNOSIS — Z79899 Other long term (current) drug therapy: Secondary | ICD-10-CM | POA: Diagnosis not present

## 2019-03-31 DIAGNOSIS — E8881 Metabolic syndrome: Secondary | ICD-10-CM | POA: Diagnosis not present

## 2019-03-31 DIAGNOSIS — Z1159 Encounter for screening for other viral diseases: Secondary | ICD-10-CM | POA: Diagnosis not present

## 2019-04-03 LAB — HEMOGLOBIN A1C
Hgb A1c MFr Bld: 5.3 % of total Hgb (ref <5.7)
Mean Plasma Glucose: 105 (calc)
eAG (mmol/L): 5.8 (calc)

## 2019-04-03 LAB — COMPLETE METABOLIC PANEL WITH GFR
AG Ratio: 1.8 (calc) (ref 1.0–2.5)
ALT: 13 U/L (ref 6–29)
AST: 16 U/L (ref 10–35)
Albumin: 4.1 g/dL (ref 3.6–5.1)
Alkaline phosphatase (APISO): 65 U/L (ref 37–153)
BUN: 16 mg/dL (ref 7–25)
CO2: 20 mmol/L (ref 20–32)
Calcium: 9.2 mg/dL (ref 8.6–10.4)
Chloride: 105 mmol/L (ref 98–110)
Creat: 0.79 mg/dL (ref 0.50–1.05)
GFR, Est African American: 99 mL/min/{1.73_m2} (ref >=60)
GFR, Est Non African American: 85 mL/min/{1.73_m2} (ref >=60)
Globulin: 2.3 g/dL (calc) (ref 1.9–3.7)
Glucose, Bld: 92 mg/dL (ref 65–99)
Potassium: 4.4 mmol/L (ref 3.5–5.3)
Sodium: 137 mmol/L (ref 135–146)
Total Bilirubin: 0.5 mg/dL (ref 0.2–1.2)
Total Protein: 6.4 g/dL (ref 6.1–8.1)

## 2019-04-03 LAB — CBC WITH DIFFERENTIAL/PLATELET
Absolute Monocytes: 586 cells/uL (ref 200–950)
Basophils Absolute: 47 cells/uL (ref 0–200)
Basophils Relative: 0.5 %
Eosinophils Absolute: 539 cells/uL — ABNORMAL HIGH (ref 15–500)
Eosinophils Relative: 5.8 %
HCT: 42.3 % (ref 35.0–45.0)
Hemoglobin: 13.8 g/dL (ref 11.7–15.5)
Lymphs Abs: 2195 cells/uL (ref 850–3900)
MCH: 29.3 pg (ref 27.0–33.0)
MCHC: 32.6 g/dL (ref 32.0–36.0)
MCV: 89.8 fL (ref 80.0–100.0)
MPV: 11.2 fL (ref 7.5–12.5)
Monocytes Relative: 6.3 %
Neutro Abs: 5933 cells/uL (ref 1500–7800)
Neutrophils Relative %: 63.8 %
Platelets: 293 10*3/uL (ref 140–400)
RBC: 4.71 10*6/uL (ref 3.80–5.10)
RDW: 13.5 % (ref 11.0–15.0)
Total Lymphocyte: 23.6 %
WBC: 9.3 10*3/uL (ref 3.8–10.8)

## 2019-04-03 LAB — VITAMIN D 25 HYDROXY (VIT D DEFICIENCY, FRACTURES): Vit D, 25-Hydroxy: 37 ng/mL (ref 30–100)

## 2019-04-03 LAB — HEPATITIS C ANTIBODY
Hepatitis C Ab: NONREACTIVE
SIGNAL TO CUT-OFF: 0.01 (ref <1.00)

## 2019-04-03 LAB — TSH: TSH: 1.75 mIU/L

## 2019-04-20 ENCOUNTER — Ambulatory Visit: Payer: Self-pay

## 2019-04-20 DIAGNOSIS — F411 Generalized anxiety disorder: Secondary | ICD-10-CM

## 2019-04-20 NOTE — Telephone Encounter (Signed)
    [place] Female, 53 y.o., 04-26-1966 MRN:  [id number] Phone:  [phone] Jerilynn Mages) PCP:  Steele Sizer, MD Primary Cvg:  Sherre Poot [employer] Ppo Next Appt With Internal Medicine 06/20/2019 at 10:00 AM Message from Loma Boston sent at 04/20/2019 9:26 AM EDT  Summary: cback itching from shots   Pt had a shingles shot and a flu shot a couples week ago and is itching all over to the point of bringing blood. Wanted some feed-back from nurse at [phone]         Call History   Type Contact Phone User  04/20/2019 09:23 AM EDT Phone (Incoming) Delena Serve H (Self) [phone] Jerilynn Mages) Loma Boston  04/20/2019 09:21 AM EDT Phone (Incoming) Payton Doughty    Answer Assessment - Initial Assessment Questions 1. SYMPTOMS: "What is the main symptom?" (e.g., redness, swelling, pain)      Headache , nauseous,  Tired itching all over.body really bad 2. ONSET: "When was the vaccine (shot) given?" "How much later did the *No Answer* begin?" (e.g., hours, days ago)     Over two weeks 3. SEVERITY: "How bad is it?" moderate     Itching getting worse   4. FEVER: "Is there a fever?" If so, ask: "What is it, how was it measured, and when did it start?"       5. IMMUNIZATIONS GIVEN: "What shots have you recently received?"     Given two  Weeks ago 6. PAST REACTIONS: "Have you reacted to immunizations before?" If so, ask: "What happened?"      7. OTHER SYMPTOMS: "Do you have any other symptoms?"   Denies just itching.  Protocols used: IMMUNIZATION REACTIONS-A-AH

## 2019-04-20 NOTE — Telephone Encounter (Signed)
Incoming call from Patient who states that she had the shingles shot and flu shot together about two weeks ago.  Reports that she is still itches  Moderately.  She no longer reports headache nausea, Reports moderate itching.  Denies itching. Patient would like recommendations for itching.  Request return phone call.

## 2019-04-21 MED ORDER — HYDROXYZINE HCL 25 MG PO TABS: 25.0000 mg | ORAL_TABLET | Freq: Two times a day (BID) | ORAL | 0 refills | Status: DC | PRN

## 2019-04-21 NOTE — Addendum Note (Signed)
Addended by: Inda Coke on: 04/21/2019 04:41 PM   Modules accepted: Orders

## 2019-04-21 NOTE — Addendum Note (Signed)
Addended by: Steele Sizer F on: 04/21/2019 08:39 PM   Modules accepted: Orders

## 2019-04-21 NOTE — Telephone Encounter (Signed)
She states she is itching all over but she does not have the hives all over like she usually does. She has that "stingling metal sensation all over her legs, arms, neck, and back". The pain pops up all over her body. She took two hydroxyzine last night and it seemed to help and helped her sleep. States the sheets and everything she touched felt like it felt like needles sticking into her skin.

## 2019-04-27 NOTE — Progress Notes (Signed)
Patient has not been here in about 1 year 10 months. Would like to talk to Dr Holley Raring about left hip hurting and arthritis. Hasnt been here in a while because of insurance coverage. Has xray of neck from emerg. Ortho ( several months ago).

## 2019-05-01 ENCOUNTER — Encounter: Payer: Self-pay | Admitting: Student in an Organized Health Care Education/Training Program

## 2019-05-01 ENCOUNTER — Other Ambulatory Visit: Payer: Self-pay

## 2019-05-01 ENCOUNTER — Ambulatory Visit
Payer: BC Managed Care – PPO | Attending: Student in an Organized Health Care Education/Training Program | Admitting: Student in an Organized Health Care Education/Training Program

## 2019-05-01 DIAGNOSIS — M5412 Radiculopathy, cervical region: Secondary | ICD-10-CM

## 2019-05-01 DIAGNOSIS — M542 Cervicalgia: Secondary | ICD-10-CM

## 2019-05-01 DIAGNOSIS — M5030 Other cervical disc degeneration, unspecified cervical region: Secondary | ICD-10-CM

## 2019-05-01 DIAGNOSIS — M4317 Spondylolisthesis, lumbosacral region: Secondary | ICD-10-CM

## 2019-05-01 DIAGNOSIS — M47816 Spondylosis without myelopathy or radiculopathy, lumbar region: Secondary | ICD-10-CM

## 2019-05-01 NOTE — Progress Notes (Signed)
Pain Management Virtual Encounter Note - Virtual Visit via [place] (real-time audio visits between healthcare provider and patient).   Patient's Phone No. & Preferred Pharmacy:  [phone] (home); [phone] (mobile); (Preferred) [phone] [email or web address]  [place], [place] [address] Phone: [phone] Fax: [phone]    Pre-screening note:  Our staff contacted Ms. Burrows and offered her an "in person", "face-to-face" appointment versus a telephone encounter. She indicated preferring the telephone encounter, at this time.   Reason for Virtual Visit: COVID-19*  Social distancing based on CDC and AMA recommendations.   I contacted [name] on 05/01/2019 via video conference.      I clearly identified myself as Gillis Santa, MD. I verified that I was speaking with the correct person using two identifiers (Name: TAMANNA TERZO, and date of birth: 03/22/1966).  Advanced Informed Consent I sought verbal advanced consent from [name] for virtual visit interactions. I informed Ms. Maidonado of possible security and privacy concerns, risks, and limitations associated with providing "not-in-person" medical evaluation and management services. I also informed Ms. Chheng of the availability of "in-person" appointments. Finally, I informed her that there would be a charge for the virtual visit and that she could be  personally, fully or partially, financially responsible for it. Ms. Dinneen expressed understanding and agreed to proceed.   Historic Elements   Ms. DENALI SMITHER is a 53 y.o. year old, female patient evaluated today after her last encounter by our practice on Visit date not found. Ms. Gendreau  has a past medical history of Allergy, Anxiety, Depression, GERD (gastroesophageal reflux disease), History of kidney stones, Insomnia,  Intermittent low back pain, Migraines, Osteoarthritis, Recurrent UTI, Restless leg syndrome, Sciatica of right side, Symptomatic menopausal or female climacteric states, Vertigo, and Vitamin D deficiency. She also  has a past surgical history that includes Tonsillectomy and adenoidectomy; Urethra dilation; Knee surgery (Left, 05/2011); Lumbar laminectomy/decompression microdiscectomy (N/A, 09/16/2016); Back surgery; Diagnostic laparoscopy; Dilation and curettage of uterus; Knee arthroscopy with medial menisectomy (Left, 01/14/2017); Colonoscopy with propofol (N/A, 02/03/2019); and Colonoscopy with propofol (N/A, 03/10/2019). Ms. Glendening has a current medication list which includes the following prescription(s): acetaminophen, fluticasone, hydroxyzine, loratadine, montelukast, omeprazole, prazosin, ropinirole, sumatriptan, tizanidine, topiramate, and venlafaxine xr. She  reports that she quit smoking about 35 years ago. Her smoking use included cigarettes. She started smoking about 37 years ago. She has a 1.50 pack-year smoking history. She has never used smokeless tobacco. She reports that she does not drink alcohol or use drugs. Ms. Kichline is allergic to penicillins; chlorhexidine gluconate; ciprofloxacin; clindamycin/lincomycin; erythromycin; keflex [cephalexin]; lyrica [pregabalin]; nitrofurantoin monohyd macro; other; sulfa antibiotics; tetracyclines & related; adhesive [tape]; latex; and vancomycin.   HPI  Today, she is being contacted for worsening of previously known (established) problem  Patient's last visit with me was on 08/04/2017 for diagnostic lumbar facet medial branch nerve block #2 at L2, L3, L4 bilaterally.  Patient states that this was beneficial for her low back and hip pain and also improved her ambulation status.  Unfortunately, the patient lost her insurance and was unable to follow-up with me.  She is now regained her insurance and would like to continue with treatment.  Patient is  endorsing low back and bilateral hip pain, (the right.  This is similar in quality and presentation to her pain prior to her diagnostic lumbar facet medial branch nerve blocks.  Of note lumbar facet  medial branch nerve block #1 at L2, L3, L4 bilateral was performed on 06/21/2017 with #2 performed on 08/04/2017.  This resulted in approximately 75% pain relief for 3-1/2 to 4 weeks with gradual return of pain thereafter.  We discussed lumbar radiofrequency ablation as the next step after the diagnostic lumbar facet medial branch nerve block.  Risks and benefits reviewed we will start with the left side first at left L2, L3, L4.  Patient is also endorsing persistent neck and shoulder pain.  She is having worsening headaches as well.  X-rays of her cervical spine show cervical degenerative disc disease and facet arthropathy.  I would like to order an MRI to evaluate for any canal or neuroforaminal stenosis.  This will help guide management as I may be able to offer the patient occipital nerve block, cervical facet medial branch nerve block or cervical ESI depending upon her symptoms and what radiographic imaging shows.  We also did discussion about her restless leg syndrome.  She is on Requip which she states is only moderately effective.  She has tried gabapentin in the past a dose as high as 800 mg nightly which she states was not effective.  Lyrica resulted in dizziness and fall also do not recommend.  I have instructed her to continue with Requip. Laboratory Chemistry Profile (12 mo)  Renal: 03/31/2019: BUN 16; BUN/Creatinine Ratio NOT APPLICABLE; Creat [id number]  Lab Results  Component Value Date   GFRAA 99 03/31/2019   GFRNONAA 85 03/31/2019   Hepatic: No results found for requested labs within last 8760 hours. Lab Results  Component Value Date   AST 16 03/31/2019   ALT 13 03/31/2019   Other: 03/31/2019: Vit D, 25-Hydroxy 37 Note: Above Lab results reviewed.  Imaging  Last 90 days:  No results  found.  Assessment  The primary encounter diagnosis was Lumbar spondylosis. Diagnoses of Spondylolisthesis of lumbosacral region, Cervical radicular pain, Cervicalgia, and Other cervical disc degeneration, unspecified cervical region were also pertinent to this visit.  Plan of Care  I am having [name] maintain her loratadine, SUMAtriptan, omeprazole, montelukast, fluticasone, tizanidine, venlafaxine XR, prazosin, rOPINIRole, topiramate, hydrOXYzine, and acetaminophen.  1. Lumbar spondylosis -Patient is status post 2+ diagnostic medial branch nerve blocks at L2, L3, L4 bilaterally on 06/21/2017 and 08/04/2017.  Both of these were positive diagnostic facet blocks which resulted in approximately 75% pain relief for 3-1/2 to 4 weeks with improvement in functional status.  Unfortunately patient lost her insurance and was unable to proceed with lumbar radiofrequency ablation but now has regained insurance and would like to pursue RFA to help with her low back and hip pain. -Plan for left L2, L3, L4 RFA followed by right with sedation - Radiofrequency,Lumbar; Future  2. Spondylolisthesis of lumbosacral region -RFA as above - Radiofrequency,Lumbar; Future  3. Cervical radicular pain -Has failed conservative therapy, x-ray reveals cervical degenerative disc disease.  Recommend cervical MRI.  Can discuss treatment options after MRI. - MR CERVICAL SPINE WO CONTRAST; Future  Orders:  Orders Placed This Encounter  Procedures  . Radiofrequency,Lumbar    Standing Status:   Future    Standing Expiration Date:   10/29/2020    Scheduling Instructions:     Side(s): Left-sided     Level: L2-3, L3-4, Facets (L2, L3, L4, Medial Branch Nerves)     Sedation: With Sedation     Scheduling Timeframe: As soon as pre-approved    Order Specific Question:   Where will this procedure be performed?  Answer:   [facility]  . MR CERVICAL SPINE WO CONTRAST    In addition to any acute findings,  please report on degenerative changes related to: (Please specify level(s)) (1) ROM & instability (>13mm displacement) (2) Facet joint (Zygoapophyseal Joint) (3) DDD and/or IVDD (4) Pars defects (5) Previous surgical changes (Include description of hardware and hardware status, if present) (6) Presence and degree of spondylolisthesis, spondylosis, and/or spondyloarthropathies)  (7) Old Fractures (8) Demineralization (9) Additional bone pathology (10) Stenosis (Central, Lateral Recess, Foraminal) (11) If at all possible, please provide AP diameter (mm) of foraminal and/or central canal.    Standing Status:   Future    Standing Expiration Date:   08/01/2019    Order Specific Question:   What is the patient's sedation requirement?    Answer:   No Sedation    Order Specific Question:   Does the patient have a pacemaker or implanted devices?    Answer:   No    Order Specific Question:   Preferred imaging location?    Answer:   [facility] (table limit-350lbs)    Order Specific Question:   Call Results- Best Contact Number?    Answer:   [phone] ([facility])    Order Specific Question:   Radiology Contrast Protocol - do NOT remove file path    Answer:   [email or web address]    Order Specific Question:   ** REASON FOR EXAM (FREE TEXT)    Answer:   Neck Pain & Radiculitis   Follow-up plan:   Return in about 2 weeks (around 05/15/2019) for Procedure left L2, L3, L4 RFA with sedation.     Recent Visits No visits were found meeting these conditions.  Showing recent visits within past 90 days and meeting all other requirements   Today's Visits Date Type Provider Dept  05/01/19 Office Visit Gillis Santa, MD [facility]  Showing today's visits and meeting all other requirements   Future Appointments No visits were found meeting these conditions.  Showing future appointments within next 90 days and meeting all other requirements   I  discussed the assessment and treatment plan with the patient. The patient was provided an opportunity to ask questions and all were answered. The patient agreed with the plan and demonstrated an understanding of the instructions.  Patient advised to call back or seek an in-person evaluation if the symptoms or condition worsens.  Total duration of non-face-to-face encounter: 51minutes.  Note by: Gillis Santa, MD Date: 05/01/2019; Time: 2:56 PM  Note: This dictation was prepared with Dragon dictation. Any transcriptional errors that may result from this process are unintentional.  Disclaimer:  * Given the special circumstances of the COVID-19 pandemic, the federal government has announced that the Office for Civil Rights (OCR) will exercise its enforcement discretion and will not impose penalties on physicians using telehealth in the event of noncompliance with regulatory requirements under the [place] (HIPAA) in connection with the good faith provision of telehealth during the XX123456 national public health emergency. ([place])

## 2019-05-11 ENCOUNTER — Other Ambulatory Visit: Payer: Self-pay

## 2019-05-11 ENCOUNTER — Encounter: Payer: Self-pay | Admitting: Psychiatry

## 2019-05-11 ENCOUNTER — Ambulatory Visit (INDEPENDENT_AMBULATORY_CARE_PROVIDER_SITE_OTHER): Payer: BC Managed Care – PPO | Admitting: Psychiatry

## 2019-05-11 DIAGNOSIS — F411 Generalized anxiety disorder: Secondary | ICD-10-CM | POA: Diagnosis not present

## 2019-05-11 DIAGNOSIS — F410 Panic disorder [episodic paroxysmal anxiety] without agoraphobia: Secondary | ICD-10-CM

## 2019-05-11 DIAGNOSIS — F5105 Insomnia due to other mental disorder: Secondary | ICD-10-CM | POA: Diagnosis not present

## 2019-05-11 DIAGNOSIS — F331 Major depressive disorder, recurrent, moderate: Secondary | ICD-10-CM | POA: Diagnosis not present

## 2019-05-11 MED ORDER — VENLAFAXINE HCL ER 37.5 MG PO CP24: 37.5000 mg | ORAL_CAPSULE | Freq: Every day | ORAL | 0 refills | Status: DC

## 2019-05-11 NOTE — Progress Notes (Signed)
Virtual Visit via Video Note  I connected with Kelly [name] on 05/11/19 at 10:00 AM EST by a video enabled telemedicine application and verified that I am speaking with the correct person using two identifiers.   I discussed the limitations of evaluation and management by telemedicine and the availability of in person appointments. The patient expressed understanding and agreed to proceed.     I discussed the assessment and treatment plan with the patient. The patient was provided an opportunity to ask questions and all were answered. The patient agreed with the plan and demonstrated an understanding of the instructions.   The patient was advised to call back or seek an in-person evaluation if the symptoms worsen or if the condition fails to improve as anticipated.   [facility] MD OP Progress Note  05/11/2019 12:15 PM Kelly [name]  MRN:  [id number]  Chief Complaint:  Chief Complaint    Follow-up     HPI: Kelly [name] is a 53 year old Caucasian female who is married, lives in [place], is a history of depression, anxiety, gastroesophageal reflux disease, bulging lumbar disc, migraine headaches was evaluated by telemedicine today.  Patient today reports she is currently struggling with worsening anxiety symptoms.  She reports she often feels nervous and restless.  She reports there has been a lot going on in her life.  She reports her son got into trouble with his ex-wife and was in jail for a day.  She reports she had to work on getting him out and also currently there is a custody battle going on regarding his kids.  Patient reports she also worries about her dad's health problems which has been worsening recently.  Patient reports she is in pain and that also has an effect on her mood as well as sleep.  She however reports sleep overall has improved and currently she does not have any nightmares.  The prazosin does help.  She reports she has been trying to reach out to her therapist Ms.  Marjie Skiff and is waiting for to hear back.  Patient is motivated to keep psychotherapy sessions.  She denies any suicidality, homicidality or perceptual disturbances.  Patient reports she continues to have support system from her husband.  She denies any other concerns today. Visit Diagnosis:    ICD-10-CM   1. MDD (major depressive disorder), recurrent episode, moderate (HCC)  F33.1 venlafaxine XR (EFFEXOR-XR) 37.5 MG 24 hr capsule   stable  2. GAD (generalized anxiety disorder)  F41.1 venlafaxine XR (EFFEXOR-XR) 37.5 MG 24 hr capsule   unstable  3. Panic disorder  F41.0 venlafaxine XR (EFFEXOR-XR) 37.5 MG 24 hr capsule   improving  4. Insomnia due to mental condition  F51.05    improving    Past Psychiatric History: I have reviewed past psychiatric history from my progress note on 10/25/2018.  Past trials of medications like Paxil, trazodone, hydroxyzine  Past Medical History:  Past Medical History:  Diagnosis Date  . Allergy   . Anxiety   . Depression   . GERD (gastroesophageal reflux disease)   . History of kidney stones   . Insomnia   . Intermittent low back pain   . Migraines   . Osteoarthritis   . Recurrent UTI   . Restless leg syndrome   . Sciatica of right side   . Symptomatic menopausal or female climacteric states   . Vertigo   . Vitamin D deficiency     Past Surgical History:  Procedure Laterality Date  . BACK  SURGERY    . COLONOSCOPY WITH PROPOFOL N/A 02/03/2019   Procedure: COLONOSCOPY WITH PROPOFOL;  Surgeon: Jonathon Bellows, MD;  Location: [facility] END[facility]COPY;  Service: Gastroenterology;  Laterality: N/A;  . COLONOSCOPY WITH PROPOFOL N/A 03/10/2019   Procedure: COLONOSCOPY WITH PROPOFOL;  Surgeon: Jonathon Bellows, MD;  Location: [facility] ENDOSCOPY;  Service: Gastroenterology;  Laterality: N/A;  . DIAGNOSTIC LAPAROSCOPY    . DILATION AND CURETTAGE OF UTERUS    . KNEE ARTHROSCOPY WITH MEDIAL MENISECTOMY Left 01/14/2017   Procedure: KNEE ARTHROSCOPY WITH MEDIAL AND LATERAL  MENISECTOMY;  Surgeon: Hessie Knows, MD;  Location: [facility];  Service: Orthopedics;  Laterality: Left;  Partial Knee menisectomy  . KNEE SURGERY Left 05/2011   Dr. Rudene Christians- Arthroscopic  . LUMBAR LAMINECTOMY/DECOMPRESSION MICRODISCECTOMY N/A 09/16/2016   Procedure: LUMBAR LAMINECTOMY/DECOMPRESSION MICRODISCECTOMY 1 LEVEL L5-S1;  Surgeon: Meade Maw, MD;  Location: [facility];  Service: Neurosurgery;  Laterality: N/A;  . TONSILLECTOMY AND ADENOIDECTOMY    . URETHRAL STRICTURE DILATATION      Family Psychiatric History: Reviewed family psychiatric history from my progress note on 10/25/2018  Family History:  Family History  Problem Relation Age of Onset  . Cancer Paternal Grandmother   . Anxiety disorder Cousin   . Depression Cousin     Social History: Reviewed social history from my progress note on 10/25/2018 Social History   Socioeconomic History  . Marital status: Married    Spouse name: Jenny Reichmann  . Number of children: 2  . Years of education: 32  . Highest education level: Associate degree: occupational, Hotel manager, or vocational program  Occupational History  . 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 Financial resource strain: Hard  . Food insecurity    Worry: Often true    Inability: Often true  . Transportation needs    Medical: No    Non-medical: Yes  Tobacco Use  . Smoking status: Former Smoker    Packs/day: 0.75    Years: 2.00    Pack years: 1.50    Types: Cigarettes    Start date: 07/06/1981    Quit date: 07/07/1983    Years since quitting: 35.8  . Smokeless tobacco: Never Used  Substance and Sexual Activity  . Alcohol use: No    Alcohol/week: 0.0 standard drinks  . Drug use: No  . Sexual activity: Yes    Partners: Male  Lifestyle  . Physical activity    Days per week: 5 days    Minutes per session: 30 min  . Stress: To some extent  Relationships  . Social connections    Talks on phone: More than three times a week    Gets together: Twice a week     Attends religious service: Never    Active member of club or organization: No    Attends meetings of clubs or organizations: Never    Relationship status: Married  Other Topics Concern  . Not on file  Social History Narrative   Married and husband has a lot of medical problems, he is working again     Allergies:  Allergies  Allergen Reactions  . Penicillins Anaphylaxis, Hives, Nausea And Vomiting and Swelling    Has patient had a PCN reaction causing immediate rash, facial/tongue/throat swelling, SOB or lightheadedness with hypotension: Yes Has patient had a PCN reaction causing severe rash involving mucus membranes or skin necrosis: Yes Has patient had a PCN reaction that required hospitalization No Has patient had a PCN reaction occurring within the last 10 years: Yes If all  of the above answers are "NO", then may proceed with Cephalosporin use.   . Chlorhexidine Gluconate Itching and Rash  . Ciprofloxacin Hives  . Clindamycin/Lincomycin Hives  . Erythromycin Hives and Nausea And Vomiting  . Keflex [Cephalexin] Hives  . Lyrica [Pregabalin]     Dizziness, syncope  . Nitrofurantoin Monohyd Macro Hives and Nausea And Vomiting  . Other Hives  . Sulfa Antibiotics Hives and Nausea And Vomiting    Rapid heart rate  . Tetracyclines & Related Hives  . Adhesive [Tape] Rash  . Latex Rash  . Vancomycin Itching and Rash    Metabolic Disorder Labs: Lab Results  Component Value Date   HGBA1C 5.3 03/31/2019   MPG 105 03/31/2019   MPG 108 06/17/2018   No results found for: PROLACTIN Lab Results  Component Value Date   CHOL 160 06/17/2018   TRIG 119 06/17/2018   HDL 56 06/17/2018   CHOLHDL 2.9 06/17/2018   VLDL 22 12/31/2016   LDLCALC 82 06/17/2018   LDLCALC 64 12/31/2016   Lab Results  Component Value Date   TSH 1.75 03/31/2019   TSH 2.56 12/31/2016    Therapeutic Level Labs: No results found for: LITHIUM No results found for: VALPROATE No components found for:   CBMZ  Current Medications: Current Outpatient Medications  Medication Sig Dispense Refill  . acetaminophen (TYLENOL) 325 MG tablet Take 650 mg by mouth every 6 (six) hours as needed.    . fluticasone (FLONASE) 50 MCG/ACT nasal spray Place 2 sprays into both nostrils daily. 16 g 2  . hydrOXYzine (ATARAX/VISTARIL) 25 MG tablet Take 1 tablet (25 mg total) by mouth 2 (two) times daily as needed for anxiety. 60 tablet 0  . loratadine (CLARITIN) 10 MG tablet Take 1 tablet (10 mg total) by mouth daily. 90 tablet 3  . montelukast (SINGULAIR) 10 MG tablet Take 1 tablet (10 mg total) by mouth at bedtime. 90 tablet 1  . omeprazole (PRILOSEC) 40 MG capsule Take 1 capsule (40 mg total) by mouth every morning. 90 capsule 1  . prazosin (MINIPRESS) 1 MG capsule Take 1 capsule (1 mg total) by mouth at bedtime. 90 capsule 1  . rOPINIRole (REQUIP) 1 MG tablet Take 1 tablet (1 mg total) by mouth at bedtime. 90 tablet 1  . SUMAtriptan (IMITREX) 100 MG tablet Take 1 tablet (100 mg total) by mouth every 2 (two) hours as needed for migraine. May repeat in 2 hours if headache persists or recurs. 10 tablet 0  . tizanidine (ZANAFLEX) 2 MG capsule Take 1 capsule (2 mg total) by mouth daily as needed for muscle spasms. 90 capsule 0  . topiramate (TOPAMAX) 100 MG tablet Take 1 tablet (100 mg total) by mouth every evening. 90 tablet 1  . venlafaxine XR (EFFEXOR-XR) 150 MG 24 hr capsule Take 1 capsule (150 mg total) by mouth daily with breakfast. 90 capsule 1  . venlafaxine XR (EFFEXOR-XR) 37.5 MG 24 hr capsule Take 1 capsule (37.5 mg total) by mouth daily with breakfast. To be combined with 150 mg 90 capsule 0   No current facility-administered medications for this visit.      Musculoskeletal: Strength & Muscle Tone: UTA Gait & Station: Observed as seated Patient leans: N/A  Psychiatric Specialty Exam: Review of Systems  Musculoskeletal: Positive for back pain.  Psychiatric/Behavioral: The patient is  nervous/anxious.   All other systems reviewed and are negative.   There were no vitals taken for this visit.There is no height or weight on file  to calculate BMI.  General Appearance: Casual  Eye Contact:  Fair  Speech:  Clear and Coherent  Volume:  Normal  Mood:  Anxious  Affect:  Congruent  Thought Process:  Goal Directed and Descriptions of Associations: Intact  Orientation:  Full (Time, Place, and Person)  Thought Content: Logical   Suicidal Thoughts:  No  Homicidal Thoughts:  No  Memory:  Immediate;   Fair Recent;   Fair Remote;   Fair  Judgement:  Fair  Insight:  Fair  Psychomotor Activity:  Normal  Concentration:  Concentration: Fair and Attention Span: Fair  Recall:  [employer] of Knowledge: Fair  Language: Fair  Akathisia:  No  Handed:  Right  AIMS (if indicated): denies tremors, rigidity  Assets:  Communication Skills Desire for [place] Talents/Skills  ADL's:  Intact  Cognition: WNL  Sleep:  Fair   Screenings: GAD-7     Office Visit from 03/23/2019 in [facility] Office Visit from 02/22/2019 in [facility] Office Visit from 09/20/2018 in [facility] Office Visit from 08/30/2018 in [facility] Office Visit from 08/24/2018 in [facility]  Total GAD-7 Score  1  1  11  10  10     PHQ2-9     Office Visit from 03/23/2019 in [facility] Office Visit from 02/22/2019 in [facility] Office Visit from 01/12/2019 in [facility] Office Visit from 09/20/2018 in [facility] Office Visit from 08/30/2018 in [facility]  PHQ-2 Total Score  0  1  2  3  3   PHQ-9 Total Score  2  4  7  12  10        Assessment and Plan: Rakeb is a 53 year old Caucasian female, married, employed, lives in [place], has a history of MDD, GAD, panic disorder, insomnia, GERD,  arthritis, chronic pain was evaluated by telemedicine today.  Patient is biologically predisposed given her family history, history of trauma, multiple medical problems.  Patient also has psychosocial stressors of financial problems, chronic pain recent legal issues of her son, her dad's health issues.  Patient is currently struggling with anxiety symptoms and will benefit from medication readjustment. Plan as noted below.  Plan MDD-stable Venlafaxine as prescribed Continue psychotherapy sessions with therapist Marjie Skiff.  GAD-unstable Increase venlafaxine XR to 187.5 mg p.o. daily  Panic disorder-improving Venlafaxine as prescribed She has hydroxyzine as needed prescribed recently by her primary care provider.  Insomnia-improving Prazosin 1 mg p.o. nightly for nightmares Melatonin 3 to 5 mg p.o. nightly Discussed sleep hygiene techniques She will also follow-up with her pain provider to manage her pain which also has an impact on her sleep   Follow-up in clinic in 4 weeks or sooner if needed.  December 3 at 10:35 AM  I have spent atleast 15 minutes non face to face with patient today. More than 50 % of the time was spent for psychoeducation and supportive psychotherapy and care coordination. This note was generated in part or whole with voice recognition software. Voice recognition is usually quite accurate but there are transcription errors that can and very often do occur. I apologize for any typographical errors that were not detected and corrected.      Ursula Alert, MD 05/11/2019, 12:15 PM

## 2019-05-12 ENCOUNTER — Other Ambulatory Visit: Payer: Self-pay | Admitting: Family Medicine

## 2019-05-12 DIAGNOSIS — G43009 Migraine without aura, not intractable, without status migrainosus: Secondary | ICD-10-CM

## 2019-05-29 ENCOUNTER — Other Ambulatory Visit: Payer: Self-pay | Admitting: Family Medicine

## 2019-05-29 DIAGNOSIS — G43009 Migraine without aura, not intractable, without status migrainosus: Secondary | ICD-10-CM

## 2019-05-29 NOTE — Telephone Encounter (Signed)
Requested medication (s) are due for refill today: yes  Requested medication (s) are on the active medication list: yes  Last refill:  03/23/2019  Future visit scheduled: yes  Notes to clinic:  Refill cannot be delegated    Requested Prescriptions  Pending Prescriptions Disp Refills   topiramate (TOPAMAX) 50 MG tablet [Pharmacy Med Name: TOPIRAMATE 50 MG TABLET] 270 tablet 0    Sig: TAKE 1-2 TABLETS BY MOUTH TWO TIMES A DAY     Not Delegated - Neurology: Anticonvulsants - topiramate & zonisamide Failed - 05/29/2019 11:10 AM      Failed - This refill cannot be delegated      Passed - Cr in normal range and within 360 days    Creat  Date Value Ref Range Status  03/31/2019 0.79 0.50 - 1.05 mg/dL Final    Comment:    For patients >47 years of age, the reference limit for Creatinine is approximately 13% higher for people identified as African-American. .          Passed - CO2 in normal range and within 360 days    CO2  Date Value Ref Range Status  03/31/2019 20 20 - 32 mmol/L Final         Passed - Valid encounter within last 12 months    Recent Outpatient Visits          2 months ago Migraine without aura and responsive to treatment   [facility] Steele Sizer, MD   3 months ago Well woman exam   [facility] [place], Drue Stager, MD   4 months ago Migraine without aura and responsive to treatment   [facility] Steele Sizer, MD   8 months ago Impingement syndrome of left shoulder   [facility] Steele Sizer, MD   9 months ago Acute pain of left shoulder   [place], NP      Future Appointments            In 3 weeks Steele Sizer, MD [facility], [facility]

## 2019-05-30 NOTE — Telephone Encounter (Signed)
Taking one of the 50 mg Topiramate and helping with her migraines.

## 2019-06-08 ENCOUNTER — Ambulatory Visit (INDEPENDENT_AMBULATORY_CARE_PROVIDER_SITE_OTHER): Payer: BC Managed Care – PPO | Admitting: Psychiatry

## 2019-06-08 ENCOUNTER — Other Ambulatory Visit: Payer: Self-pay

## 2019-06-08 ENCOUNTER — Encounter: Payer: Self-pay | Admitting: Psychiatry

## 2019-06-08 DIAGNOSIS — F410 Panic disorder [episodic paroxysmal anxiety] without agoraphobia: Secondary | ICD-10-CM

## 2019-06-08 DIAGNOSIS — F331 Major depressive disorder, recurrent, moderate: Secondary | ICD-10-CM

## 2019-06-08 DIAGNOSIS — F411 Generalized anxiety disorder: Secondary | ICD-10-CM | POA: Diagnosis not present

## 2019-06-08 DIAGNOSIS — F5105 Insomnia due to other mental disorder: Secondary | ICD-10-CM | POA: Diagnosis not present

## 2019-06-08 NOTE — Progress Notes (Signed)
Virtual Visit via Video Note  I connected with Kelly [name] on 06/08/19 at 10:35 AM EST by a video enabled telemedicine application and verified that I am speaking with the correct person using two identifiers.   I discussed the limitations of evaluation and management by telemedicine and the availability of in person appointments. The patient expressed understanding and agreed to proceed.    I discussed the assessment and treatment plan with the patient. The patient was provided an opportunity to ask questions and all were answered. The patient agreed with the plan and demonstrated an understanding of the instructions.   The patient was advised to call back or seek an in-person evaluation if the symptoms worsen or if the condition fails to improve as anticipated.   BH MD/PA/NP OP Progress Note  06/08/2019 11:29 AM DONYALE MISENER  MRN:  [id number]  Chief Complaint:  Chief Complaint    Follow-up     HPI: Kelly [name] is a 53 year old Caucasian female who is married, lives in [place], has a history of depression, anxiety disorder, panic disorder, insomnia, gastroesophageal reflux disease, lumbar disc problems, migraine headache was evaluated by telemedicine today.  Patient reports she had a good Thanksgiving holiday.  She spent it with her husband.  She reports she has had some ups and downs recently however she believes her mood symptoms are improving on the current dosage increase of venlafaxine.  She wants to give the venlafaxine more time.  She denies side effects.  She reports she continues to struggle with sleep problems.  She reports she continues to does not have a good sleep hygiene.  She also forgets to take the melatonin most nights.  Patient reports she wants to start working on her sleep hygiene and also wants to try to take the melatonin prior to starting another sleep aid.  Some time was spent providing education about sleep hygiene and sleep tips.  Patient reports  she continues to work with her therapist Ms. Marjie Skiff.  She is scheduled to see her again today.  She denies any suicidality, homicidality or perceptual disturbances.  Patient denies any other concerns today. Visit Diagnosis:    ICD-10-CM   1. MDD (major depressive disorder), recurrent episode, moderate (HCC)  F33.1   2. GAD (generalized anxiety disorder)  F41.1    improving  3. Panic disorder  F41.0   4. Insomnia due to mental condition  F51.05     Past Psychiatric History: I have reviewed past psychiatric history from my progress note on 10/25/2018.  Past trials of medications like Paxil, trazodone, hydroxyzine.  Past Medical History:  Past Medical History:  Diagnosis Date  . Allergy   . Anxiety   . Depression   . GERD (gastroesophageal reflux disease)   . History of kidney stones   . Insomnia   . Intermittent low back pain   . Migraines   . Osteoarthritis   . Recurrent UTI   . Restless leg syndrome   . Sciatica of right side   . Symptomatic menopausal or female climacteric states   . Vertigo   . Vitamin D deficiency     Past Surgical History:  Procedure Laterality Date  . BACK SURGERY    . COLONOSCOPY WITH PROPOFOL N/A 02/03/2019   Procedure: COLONOSCOPY WITH PROPOFOL;  Surgeon: Jonathon Bellows, MD;  Location: [facility] ENDOSCOPY;  Service: Gastroenterology;  Laterality: N/A;  . COLONOSCOPY WITH PROPOFOL N/A 03/10/2019   Procedure: COLONOSCOPY WITH PROPOFOL;  Surgeon: Jonathon Bellows, MD;  Location: [facility]  ENDOSCOPY;  Service: Gastroenterology;  Laterality: N/A;  . DIAGNOSTIC LAPAROSCOPY    . DILATION AND CURETTAGE OF UTERUS    . KNEE ARTHROSCOPY WITH MEDIAL MENISECTOMY Left 01/14/2017   Procedure: KNEE ARTHROSCOPY WITH MEDIAL AND LATERAL MENISECTOMY;  Surgeon: Hessie Knows, MD;  Location: [facility] ORS;  Service: Orthopedics;  Laterality: Left;  Partial Knee menisectomy  . KNEE SURGERY Left 05/2011   Dr. Rudene Christians- Arthroscopic  . LUMBAR LAMINECTOMY/DECOMPRESSION MICRODISCECTOMY N/A  09/16/2016   Procedure: LUMBAR LAMINECTOMY/DECOMPRESSION MICRODISCECTOMY 1 LEVEL L5-S1;  Surgeon: Meade Maw, MD;  Location: [facility] ORS;  Service: Neurosurgery;  Laterality: N/A;  . TONSILLECTOMY AND ADENOIDECTOMY    . URETHRAL STRICTURE DILATATION      Family Psychiatric History: I have reviewed family psychiatric history from my progress note on 10/25/2018.  Family History:  Family History  Problem Relation Age of Onset  . Cancer Paternal Grandmother   . Anxiety disorder Cousin   . Depression Cousin     Social History: I have reviewed social history from my progress note on 10/25/2018. Social History   Socioeconomic History  . Marital status: Married    Spouse name: Jenny Reichmann  . Number of children: 2  . Years of education: 29  . Highest education level: Associate degree: occupational, Hotel manager, or vocational program  Occupational History  . 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 Financial resource strain: Hard  . Food insecurity    Worry: Often true    Inability: Often true  . Transportation needs    Medical: No    Non-medical: Yes  Tobacco Use  . Smoking status: Former Smoker    Packs/day: 0.75    Years: 2.00    Pack years: 1.50    Types: Cigarettes    Start date: 07/06/1981    Quit date: 07/07/1983    Years since quitting: 35.9  . Smokeless tobacco: Never Used  Substance and Sexual Activity  . Alcohol use: No    Alcohol/week: 0.0 standard drinks  . Drug use: No  . Sexual activity: Yes    Partners: Male  Lifestyle  . Physical activity    Days per week: 5 days    Minutes per session: 30 min  . Stress: To some extent  Relationships  . Social connections    Talks on phone: More than three times a week    Gets together: Twice a week    Attends religious service: Never    Active member of club or organization: No    Attends meetings of clubs or organizations: Never    Relationship status: Married  Other Topics Concern  . Not on file  Social History  Narrative   Married and husband has a lot of medical problems, he is working again     Allergies:  Allergies  Allergen Reactions  . Penicillins Anaphylaxis, Hives, Nausea And Vomiting and Swelling    Has patient had a PCN reaction causing immediate rash, facial/tongue/throat swelling, SOB or lightheadedness with hypotension: Yes Has patient had a PCN reaction causing severe rash involving mucus membranes or skin necrosis: Yes Has patient had a PCN reaction that required hospitalization No Has patient had a PCN reaction occurring within the last 10 years: Yes If all of the above answers are "NO", then may proceed with Cephalosporin use.   . Chlorhexidine Gluconate Itching and Rash  . Ciprofloxacin Hives  . Clindamycin/Lincomycin Hives  . Erythromycin Hives and Nausea And Vomiting  . Keflex [Cephalexin] Hives  . Lyrica [Pregabalin]  Dizziness, syncope  . Nitrofurantoin Monohyd Macro Hives and Nausea And Vomiting  . Other Hives  . Sulfa Antibiotics Hives and Nausea And Vomiting    Rapid heart rate  . Tetracyclines & Related Hives  . Adhesive [Tape] Rash  . Latex Rash  . Vancomycin Itching and Rash    Metabolic Disorder Labs: Lab Results  Component Value Date   HGBA1C 5.3 03/31/2019   MPG 105 03/31/2019   MPG 108 06/17/2018   No results found for: PROLACTIN Lab Results  Component Value Date   CHOL 160 06/17/2018   TRIG 119 06/17/2018   HDL 56 06/17/2018   CHOLHDL 2.9 06/17/2018   VLDL 22 12/31/2016   LDLCALC 82 06/17/2018   LDLCALC 64 12/31/2016   Lab Results  Component Value Date   TSH 1.75 03/31/2019   TSH 2.56 12/31/2016    Therapeutic Level Labs: No results found for: LITHIUM No results found for: VALPROATE No components found for:  CBMZ  Current Medications: Current Outpatient Medications  Medication Sig Dispense Refill  . acetaminophen (TYLENOL) 325 MG tablet Take 650 mg by mouth every 6 (six) hours as needed.    . fluticasone (FLONASE) 50 MCG/ACT  nasal spray Place 2 sprays into both nostrils daily. 16 g 2  . hydrOXYzine (ATARAX/VISTARIL) 25 MG tablet Take 1 tablet (25 mg total) by mouth 2 (two) times daily as needed for anxiety. 60 tablet 0  . loratadine (CLARITIN) 10 MG tablet Take 1 tablet (10 mg total) by mouth daily. 90 tablet 3  . montelukast (SINGULAIR) 10 MG tablet Take 1 tablet (10 mg total) by mouth at bedtime. 90 tablet 1  . omeprazole (PRILOSEC) 40 MG capsule Take 1 capsule (40 mg total) by mouth every morning. 90 capsule 1  . prazosin (MINIPRESS) 1 MG capsule Take 1 capsule (1 mg total) by mouth at bedtime. 90 capsule 1  . rOPINIRole (REQUIP) 1 MG tablet Take 1 tablet (1 mg total) by mouth at bedtime. 90 tablet 1  . SUMAtriptan (IMITREX) 100 MG tablet Take 1 tablet (100 mg total) by mouth every 2 (two) hours as needed for migraine. May repeat in 2 hours if headache persists or recurs. 10 tablet 0  . tizanidine (ZANAFLEX) 2 MG capsule Take 1 capsule (2 mg total) by mouth daily as needed for muscle spasms. 90 capsule 0  . topiramate (TOPAMAX) 100 MG tablet Take 1 tablet (100 mg total) by mouth every evening. 90 tablet 1  . topiramate (TOPAMAX) 50 MG tablet Take 1 tablet (50 mg total) by mouth 2 (two) times daily. 180 tablet 0  . venlafaxine XR (EFFEXOR-XR) 150 MG 24 hr capsule Take 1 capsule (150 mg total) by mouth daily with breakfast. 90 capsule 1  . venlafaxine XR (EFFEXOR-XR) 37.5 MG 24 hr capsule Take 1 capsule (37.5 mg total) by mouth daily with breakfast. To be combined with 150 mg 90 capsule 0   No current facility-administered medications for this visit.      Musculoskeletal: Strength & Muscle Tone: UTA Gait & Station: Observed as seated Patient leans: N/A  Psychiatric Specialty Exam: Review of Systems  Psychiatric/Behavioral: The patient is nervous/anxious and has insomnia.   All other systems reviewed and are negative.   There were no vitals taken for this visit.There is no height or weight on file to  calculate BMI.  General Appearance: Casual  Eye Contact:  Fair  Speech:  Clear and Coherent  Volume:  Normal  Mood:  Anxious  Affect:  Congruent  Thought Process:  Goal Directed and Descriptions of Associations: Intact  Orientation:  Full (Time, Place, and Person)  Thought Content: Logical   Suicidal Thoughts:  No  Homicidal Thoughts:  No  Memory:  Immediate;   Fair Recent;   Fair Remote;   Fair  Judgement:  Fair  Insight:  Fair  Psychomotor Activity:  Normal  Concentration:  Concentration: Fair and Attention Span: Fair  Recall:  [employer] of Knowledge: Fair  Language: Fair  Akathisia:  No  Handed:  Right  AIMS (if indicated): denies tremors, rigidity  Assets:  Communication Skills Desire for Improvement Housing Social Support  ADL's:  Intact  Cognition: WNL  Sleep:  restless   Screenings: GAD-7     Office Visit from 03/23/2019 in [facility] Office Visit from 02/22/2019 in [facility] Office Visit from 09/20/2018 in [facility] Office Visit from 08/30/2018 in [facility] Office Visit from 08/24/2018 in [facility]  Total GAD-7 Score  1  1  11  10  10     PHQ2-9     Office Visit from 03/23/2019 in [facility] Office Visit from 02/22/2019 in [facility] Office Visit from 01/12/2019 in [facility] Office Visit from 09/20/2018 in [facility] Office Visit from 08/30/2018 in [facility]  PHQ-2 Total Score  0  1  2  3  3   PHQ-9 Total Score  2  4  7  12  10        Assessment and Plan: Kelly [name] is a 53 year old Caucasian female, married, employed, lives in [place], has a history of MDD, GAD, panic disorder, insomnia, GERD, arthritis, chronic pain was evaluated by telemedicine today.  Patient is biologically predisposed given her family history, history of trauma, multiple medical problems.   Patient with psychosocial stressors of financial problems, chronic pain, recent legal issues of her son, her dad's health issues.  Patient is currently making progress however continues to struggle with sleep.  She will benefit from medication readjustment as well as psychotherapy sessions.  Plan MDD-stable Venlafaxine as prescribed Continue CBT.  GAD-improving Venlafaxine XR 187.5 mg p.o. daily. Continue CBT with her therapist Ms. Marjie Skiff.  Panic disorder-improving Venlafaxine as prescribed Hydroxyzine 25 mg p.o. twice daily as needed.  Insomnia-unstable Prazosin 1 mg p.o. nightly for nightmares Patient reports she does not follow good sleep hygiene and is noncompliant with melatonin. Provided education, encouraged compliance. Discussed starting a prescription medication for sleep-patient reports she wants to wait.   Follow-up in clinic in 4 weeks or sooner if needed.  January 7 at 10:40 AM.  I have spent atleast 15 minutes non face to face with patient today. More than 50 % of the time was spent for psychoeducation and supportive psychotherapy and care coordination. This note was generated in part or whole with voice recognition software. Voice recognition is usually quite accurate but there are transcription errors that can and very often do occur. I apologize for any typographical errors that were not detected and corrected.     Ursula Alert, MD 06/08/2019, 11:29 AM

## 2019-06-20 ENCOUNTER — Encounter: Payer: Self-pay | Admitting: Family Medicine

## 2019-06-20 ENCOUNTER — Ambulatory Visit (INDEPENDENT_AMBULATORY_CARE_PROVIDER_SITE_OTHER): Payer: BC Managed Care – PPO | Admitting: Family Medicine

## 2019-06-20 DIAGNOSIS — K219 Gastro-esophageal reflux disease without esophagitis: Secondary | ICD-10-CM | POA: Diagnosis not present

## 2019-06-20 DIAGNOSIS — E8881 Metabolic syndrome: Secondary | ICD-10-CM | POA: Diagnosis not present

## 2019-06-20 DIAGNOSIS — G43009 Migraine without aura, not intractable, without status migrainosus: Secondary | ICD-10-CM

## 2019-06-20 DIAGNOSIS — J3089 Other allergic rhinitis: Secondary | ICD-10-CM

## 2019-06-20 DIAGNOSIS — F330 Major depressive disorder, recurrent, mild: Secondary | ICD-10-CM

## 2019-06-20 DIAGNOSIS — G2581 Restless legs syndrome: Secondary | ICD-10-CM | POA: Diagnosis not present

## 2019-06-20 DIAGNOSIS — M542 Cervicalgia: Secondary | ICD-10-CM

## 2019-06-20 DIAGNOSIS — J302 Other seasonal allergic rhinitis: Secondary | ICD-10-CM

## 2019-06-20 MED ORDER — TIZANIDINE HCL 2 MG PO CAPS: 2.0000 mg | ORAL_CAPSULE | Freq: Every day | ORAL | 0 refills | Status: DC | PRN

## 2019-06-20 MED ORDER — OMEPRAZOLE 40 MG PO CPDR: 40.0000 mg | DELAYED_RELEASE_CAPSULE | Freq: Every morning | ORAL | 1 refills | Status: DC

## 2019-06-20 MED ORDER — MONTELUKAST SODIUM 10 MG PO TABS: 10.0000 mg | ORAL_TABLET | Freq: Every day | ORAL | 1 refills | Status: DC

## 2019-06-20 MED ORDER — SUMATRIPTAN SUCCINATE 100 MG PO TABS: 100.0000 mg | ORAL_TABLET | ORAL | 0 refills | Status: DC | PRN

## 2019-06-20 NOTE — Progress Notes (Signed)
Name: Kelly [name]   MRN: [id number]    DOB: [birth year]   Date:06/20/2019       Progress Note  Subjective  Chief Complaint  Chief Complaint  Patient presents with  . Follow-up    3 month follow up    I connected with  [name]  on 06/20/19 at 10:00 AM EST by a video enabled telemedicine application and verified that I am speaking with the correct person using two identifiers.  I discussed the limitations of evaluation and management by telemedicine and the availability of in person appointments. The patient expressed understanding and agreed to proceed. Staff also discussed with the patient that there may be a patient responsible charge related to this service. Patient Location: at home Provider Location: [facility]   HPI  MDD: she is still seeing Dr. Shea Evans, she states she is doing well, on higher dose of Effexor , taking a 150 mg and one 37.5 capsules daily. Phq 9 is 4 today, feeling tired, not sleeping well and getting close to the Holidays and does not feel like she cannot get things done. Insomnia: able to fall asleep but wakes up during the night  Migraine: she states she has not have a severe migraine for a long time, but it was worse around the month of September.  She states since started Topamax she has not had any episodes of migraine, usually temporal and goes to opposite side, associated with photophobia, phonophobia, dizziness , and sometimes vomiting and scotomas.  She states Topamax seems to be helping, only one severe episode since she started Topamax   Metabolic Syndrome: she denies polyphagia, polydipsia or polyuria. A1C was down to 5.3%   Tachycardia: she has episodes of palpitation, and heart rate was up in the past., last CBC and TSH were normal except for increase eosinophils. She still has some episodes associated with SOB. She has discussed with Dr. Shea Evans and not sure of the cause. She does not know the triggers. Episodes can  last up to 15 minutes. Discussed referral to cardiologist but she wants to hold off for now, she thinks it may be anxiety related   GERD: under control with medication at this time  Chronic pain: she has neck pain, back pain, OA and RLS she is trying to see Dr. Holley Raring but having some insurance issues. She said worse pain is on her legs from RLS   Patient Active Problem List   Diagnosis Date Noted  . Lumbar spondylosis 05/01/2019  . Cervicalgia 05/01/2019  . Encounter for screening colonoscopy   . Polyp of sigmoid colon   . GAD (generalized anxiety disorder) 02/28/2019  . Panic disorder 02/28/2019  . Insomnia due to mental condition 02/28/2019  . Morbid obesity ([place]) 07/26/2017  . Spondylolisthesis of lumbosacral region 02/11/2016  . Hematuria 04/09/2015  . Hyperglycemia 04/06/2015  . Right lumbar radiculitis 12/28/2014  . MDD (major depressive disorder), recurrent episode, moderate ([place]) 12/27/2014  . Gastro-esophageal reflux disease without esophagitis 12/27/2014  . Bulge of lumbar disc without myelopathy 12/27/2014  . Dysmetabolic syndrome [id number]  . Migraine without aura and responsive to treatment 12/27/2014  . Osteoarthrosis 12/27/2014  . Obesity (BMI 30-39.9) 12/27/2014  . Restless leg 12/27/2014  . Allergic rhinitis, seasonal 12/27/2014    Past Surgical History:  Procedure Laterality Date  . BACK SURGERY    . COLONOSCOPY WITH PROPOFOL N/A 02/03/2019   Procedure: COLONOSCOPY WITH PROPOFOL;  Surgeon: Jonathon Bellows, MD;  Location: [facility] ENDOSCOPY;  Service:  Gastroenterology;  Laterality: N/A;  . COLONOSCOPY WITH PROPOFOL N/A 03/10/2019   Procedure: COLONOSCOPY WITH PROPOFOL;  Surgeon: Jonathon Bellows, MD;  Location: [facility] ENDOSCOPY;  Service: Gastroenterology;  Laterality: N/A;  . DIAGNOSTIC LAPAROSCOPY    . DILATION AND CURETTAGE OF UTERUS    . KNEE ARTHROSCOPY WITH MEDIAL MENISECTOMY Left 01/14/2017   Procedure: KNEE ARTHROSCOPY WITH MEDIAL AND LATERAL MENISECTOMY;  Surgeon:  Hessie Knows, MD;  Location: [facility] ORS;  Service: Orthopedics;  Laterality: Left;  Partial Knee menisectomy  . KNEE SURGERY Left 05/2011   Dr. Rudene Christians- Arthroscopic  . LUMBAR LAMINECTOMY/DECOMPRESSION MICRODISCECTOMY N/A 09/16/2016   Procedure: LUMBAR LAMINECTOMY/DECOMPRESSION MICRODISCECTOMY 1 LEVEL L5-S1;  Surgeon: Meade Maw, MD;  Location: [facility] ORS;  Service: Neurosurgery;  Laterality: N/A;  . TONSILLECTOMY AND ADENOIDECTOMY    . URETHRAL STRICTURE DILATATION      Family History  Problem Relation Age of Onset  . Cancer Paternal Grandmother   . Anxiety disorder Cousin   . Depression Cousin     Social History   Socioeconomic History  . Marital status: Married    Spouse name: Jenny Reichmann  . Number of children: 2  . Years of education: 34  . Highest education level: Associate degree: occupational, Hotel manager, or vocational program  Occupational History  . Occupation: not employed  Tobacco Use  . Smoking status: Former Smoker    Packs/day: 0.75    Years: 2.00    Pack years: 1.50    Types: Cigarettes    Start date: 07/06/1981    Quit date: 07/07/1983    Years since quitting: 35.9  . Smokeless tobacco: Never Used  Substance and Sexual Activity  . Alcohol use: No    Alcohol/week: 0.0 standard drinks  . Drug use: No  . Sexual activity: Yes    Partners: Male  Other Topics Concern  . Not on file  Social History Narrative   Married and husband has a lot of medical problems, he is working again    Investment banker, operational of Radio broadcast assistant Strain: Medium Risk  . Difficulty of Paying Living Expenses: Somewhat hard  Food Insecurity: No Food Insecurity  . Worried About Charity fundraiser in the Last Year: Never true  . Ran Out of Food in the Last Year: Never true  Transportation Needs: No Transportation Needs  . Lack of Transportation (Medical): No  . Lack of Transportation (Non-Medical): No  Physical Activity: Sufficiently Active  . Days of Exercise per Week: 5 days  .  Minutes of Exercise per Session: 30 min  Stress: Stress Concern Present  . Feeling of Stress : To some extent  Social Connections: Not Isolated  . Frequency of Communication with Friends and Family: More than three times a week  . Frequency of Social Gatherings with Friends and Family: More than three times a week  . Attends Religious Services: More than 4 times per year  . Active Member of Clubs or Organizations: Yes  . Attends Archivist Meetings: More than 4 times per year  . Marital Status: Married  Human resources officer Violence: Not At Risk  . Fear of Current or Ex-Partner: No  . Emotionally Abused: No  . Physically Abused: No  . Sexually Abused: No     Current Outpatient Medications:  .  acetaminophen (TYLENOL) 325 MG tablet, Take 650 mg by mouth every 6 (six) hours as needed., Disp: , Rfl:  .  fluticasone (FLONASE) 50 MCG/ACT nasal spray, Place 2 sprays into both nostrils daily., Disp:  16 g, Rfl: 2 .  hydrOXYzine (ATARAX/VISTARIL) 25 MG tablet, Take 1 tablet (25 mg total) by mouth 2 (two) times daily as needed for anxiety., Disp: 60 tablet, Rfl: 0 .  loratadine (CLARITIN) 10 MG tablet, Take 1 tablet (10 mg total) by mouth daily., Disp: 90 tablet, Rfl: 3 .  montelukast (SINGULAIR) 10 MG tablet, Take 1 tablet (10 mg total) by mouth at bedtime., Disp: 90 tablet, Rfl: 1 .  omeprazole (PRILOSEC) 40 MG capsule, Take 1 capsule (40 mg total) by mouth every morning., Disp: 90 capsule, Rfl: 1 .  prazosin (MINIPRESS) 1 MG capsule, Take 1 capsule (1 mg total) by mouth at bedtime., Disp: 90 capsule, Rfl: 1 .  rOPINIRole (REQUIP) 1 MG tablet, Take 1 tablet (1 mg total) by mouth at bedtime., Disp: 90 tablet, Rfl: 1 .  SUMAtriptan (IMITREX) 100 MG tablet, Take 1 tablet (100 mg total) by mouth every 2 (two) hours as needed for migraine. May repeat in 2 hours if headache persists or recurs., Disp: 10 tablet, Rfl: 0 .  tizanidine (ZANAFLEX) 2 MG capsule, Take 1 capsule (2 mg total) by mouth  daily as needed for muscle spasms., Disp: 90 capsule, Rfl: 0 .  topiramate (TOPAMAX) 100 MG tablet, Take 1 tablet (100 mg total) by mouth every evening., Disp: 90 tablet, Rfl: 1 .  topiramate (TOPAMAX) 50 MG tablet, Take 1 tablet (50 mg total) by mouth 2 (two) times daily., Disp: 180 tablet, Rfl: 0 .  venlafaxine XR (EFFEXOR-XR) 150 MG 24 hr capsule, Take 1 capsule (150 mg total) by mouth daily with breakfast., Disp: 90 capsule, Rfl: 1 .  venlafaxine XR (EFFEXOR-XR) 37.5 MG 24 hr capsule, Take 1 capsule (37.5 mg total) by mouth daily with breakfast. To be combined with 150 mg, Disp: 90 capsule, Rfl: 0  Allergies  Allergen Reactions  . Penicillins Anaphylaxis, Hives, Nausea And Vomiting and Swelling    Has patient had a PCN reaction causing immediate rash, facial/tongue/throat swelling, SOB or lightheadedness with hypotension: Yes Has patient had a PCN reaction causing severe rash involving mucus membranes or skin necrosis: Yes Has patient had a PCN reaction that required hospitalization No Has patient had a PCN reaction occurring within the last 10 years: Yes If all of the above answers are "NO", then may proceed with Cephalosporin use.   . Chlorhexidine Gluconate Itching and Rash  . Ciprofloxacin Hives  . Clindamycin/Lincomycin Hives  . Erythromycin Hives and Nausea And Vomiting  . Keflex [Cephalexin] Hives  . Lyrica [Pregabalin]     Dizziness, syncope  . Nitrofurantoin Monohyd Macro Hives and Nausea And Vomiting  . Other Hives  . Sulfa Antibiotics Hives and Nausea And Vomiting    Rapid heart rate  . Tetracyclines & Related Hives  . Adhesive [Tape] Rash  . Latex Rash  . Vancomycin Itching and Rash    I personally reviewed active problem list, medication list, allergies, family history, social history, health maintenance with the patient/caregiver today.   ROS  Ten systems reviewed and is negative except as mentioned in HPI   Objective  Virtual encounter, vitals not  obtained.  There is no height or weight on file to calculate BMI.  Physical Exam  Awake, alert and oriented  PHQ2/9: Depression screen [facility] 2/9 06/20/2019 03/23/2019 02/22/2019 02/22/2019 01/12/2019  Decreased Interest 0 0 1 0 1  Down, Depressed, Hopeless 1 0 0 0 1  PHQ - 2 Score 1 0 1 0 2  Altered sleeping 2 1 1  0 2  Tired, decreased energy 2 1 2  0 2  Change in appetite 0 0 0 0 1  Feeling bad or failure about yourself  1 0 0 0 0  Trouble concentrating 0 0 0 0 0  Moving slowly or fidgety/restless 0 0 0 0 0  Suicidal thoughts 0 0 0 0 0  PHQ-9 Score 6 2 4  0 7  Difficult doing work/chores Somewhat difficult Not difficult at all Somewhat difficult - Somewhat difficult  Some recent data might be hidden   PHQ-2/9 Result is positive.    Fall Risk: Fall Risk  06/20/2019 03/23/2019 02/22/2019 01/12/2019 08/30/2018  Falls in the past year? 1 0 0 1 1  Number falls in past yr: 1 0 0 1 1  Comment - - - - -  Injury with Fall? 0 0 0 1 1  Comment - - - - -  Risk for fall due to : - - - - History of fall(s);Impaired balance/gait  Risk for fall due to: Comment - - - - -  Follow up - - - - -    Assessment & Plan  1. Migraine without aura and responsive to treatment  Continue Topamax  2. Dysmetabolic syndrome  Doing well   3. Gastro-esophageal reflux disease without esophagitis  Controlled  - omeprazole (PRILOSEC) 40 MG capsule; Take 1 capsule (40 mg total) by mouth every morning.  Dispense: 90 capsule; Refill: 1   4. Restless leg  She waiting to see Dr. Holley Raring   5. Mild recurrent major depression ([place])  Keep follow up with Dr. Shea Evans  6. Perennial allergic rhinitis with seasonal variation  - montelukast (SINGULAIR) 10 MG tablet; Take 1 tablet (10 mg total) by mouth at bedtime.  Dispense: 90 tablet; Refill: 1  7. Bilateral neck pain  - tizanidine (ZANAFLEX) 2 MG capsule; Take 1 capsule (2 mg total) by mouth daily as needed for muscle spasms.  Dispense: 90 capsule; Refill: 0  I  discussed the assessment and treatment plan with the patient. The patient was provided an opportunity to ask questions and all were answered. The patient agreed with the plan and demonstrated an understanding of the instructions.  The patient was advised to call back or seek an in-person evaluation if the symptoms worsen or if the condition fails to improve as anticipated.  I provided 25 minutes of non-face-to-face time during this encounter.

## 2019-07-06 ENCOUNTER — Other Ambulatory Visit: Payer: Self-pay | Admitting: Family Medicine

## 2019-07-06 DIAGNOSIS — G2581 Restless legs syndrome: Secondary | ICD-10-CM

## 2019-07-13 ENCOUNTER — Encounter: Payer: Self-pay | Admitting: Psychiatry

## 2019-07-13 ENCOUNTER — Ambulatory Visit (INDEPENDENT_AMBULATORY_CARE_PROVIDER_SITE_OTHER): Payer: BC Managed Care – PPO | Admitting: Psychiatry

## 2019-07-13 ENCOUNTER — Other Ambulatory Visit: Payer: Self-pay

## 2019-07-13 DIAGNOSIS — F3342 Major depressive disorder, recurrent, in full remission: Secondary | ICD-10-CM

## 2019-07-13 DIAGNOSIS — F410 Panic disorder [episodic paroxysmal anxiety] without agoraphobia: Secondary | ICD-10-CM | POA: Diagnosis not present

## 2019-07-13 DIAGNOSIS — F411 Generalized anxiety disorder: Secondary | ICD-10-CM

## 2019-07-13 DIAGNOSIS — F5105 Insomnia due to other mental disorder: Secondary | ICD-10-CM

## 2019-07-13 NOTE — Progress Notes (Signed)
Virtual Visit via Video Note  I connected with Kelly [name] on 07/13/19 at 10:40 AM EST by a video enabled telemedicine application and verified that I am speaking with the correct person using two identifiers.   I discussed the limitations of evaluation and management by telemedicine and the availability of in person appointments. The patient expressed understanding and agreed to proceed.   I discussed the assessment and treatment plan with the patient. The patient was provided an opportunity to ask questions and all were answered. The patient agreed with the plan and demonstrated an understanding of the instructions.   The patient was advised to call back or seek an in-person evaluation if the symptoms worsen or if the condition fails to improve as anticipated.   [place] MD OP Progress Note  07/13/2019 12:53 PM Kelly [name]  MRN:  [id number]  Chief Complaint:  Chief Complaint    Follow-up     HPI: Kelly [name] is a 54 year old Caucasian female who is married, lives in [place], has a history of depression, anxiety disorder, panic disorder, insomnia, gastroesophageal reflux disease, lumbar disc problems, migraine headache was evaluated by telemedicine today.  Patient today reports she is doing well on the current medication regimen.  She reports her mood symptoms are currently stable on the Effexor.  She reports sleep is good.  Patient denies any suicidality, homicidality or perceptual disturbances.  Patient reports she has started journaling and continues to work with her therapist.  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  She denies any other concerns today. Visit Diagnosis:    ICD-10-CM   1. MDD (major depressive disorder), recurrent, in full remission ([place])  F33.42   2. GAD (generalized anxiety disorder)  F41.1   3. Panic disorder  F41.0   4. Insomnia due to mental condition  F51.05     Past Psychiatric History: I have reviewed past psychiatric history  from my progress note on 10/25/2018.  Past trials of medications like Paxil, trazodone, hydroxyzine.  Past Medical History:  Past Medical History:  Diagnosis Date  . Allergy   . Anxiety   . Depression   . GERD (gastroesophageal reflux disease)   . History of kidney stones   . Insomnia   . Intermittent low back pain   . Migraines   . Osteoarthritis   . Recurrent UTI   . Restless leg syndrome   . Sciatica of right side   . Symptomatic menopausal or female climacteric states   . Vertigo   . Vitamin D deficiency     Past Surgical History:  Procedure Laterality Date  . BACK SURGERY    . COLONOSCOPY WITH PROPOFOL N/A 02/03/2019   Procedure: COLONOSCOPY WITH PROPOFOL;  Surgeon: Jonathon Bellows, MD;  Location: [facility] ENDOSCOPY;  Service: Gastroenterology;  Laterality: N/A;  . COLONOSCOPY WITH PROPOFOL N/A 03/10/2019   Procedure: COLONOSCOPY WITH PROPOFOL;  Surgeon: Jonathon Bellows, MD;  Location: [facility] ENDOSCOPY;  Service: Gastroenterology;  Laterality: N/A;  . DIAGNOSTIC LAPAROSCOPY    . DILATION AND CURETTAGE OF UTERUS    . KNEE ARTHROSCOPY WITH MEDIAL MENISECTOMY Left 01/14/2017   Procedure: KNEE ARTHROSCOPY WITH MEDIAL AND LATERAL MENISECTOMY;  Surgeon: Hessie Knows, MD;  Location: [facility] ORS;  Service: Orthopedics;  Laterality: Left;  Partial Knee menisectomy  . KNEE SURGERY Left 05/2011   Dr. Rudene Christians- Arthroscopic  . LUMBAR LAMINECTOMY/DECOMPRESSION MICRODISCECTOMY N/A 09/16/2016   Procedure: LUMBAR LAMINECTOMY/DECOMPRESSION MICRODISCECTOMY 1 LEVEL L5-S1;  Surgeon: Meade Maw, MD;  Location: [facility] ORS;  Service: Neurosurgery;  Laterality: N/A;  . TONSILLECTOMY AND ADENOIDECTOMY    . URETHRAL STRICTURE DILATATION      Family Psychiatric History: I have reviewed family psychiatric history from my progress note on 10/25/2018.  Family History:  Family History  Problem Relation Age of Onset  . Cancer Paternal Grandmother   . Anxiety disorder Cousin   . Depression Cousin     Social History:  Reviewed social history from my progress note on 10/25/2018. Social History   Socioeconomic History  . Marital status: Married    Spouse name: Jenny Reichmann  . Number of children: 2  . Years of education: 45  . Highest education level: Associate degree: occupational, Hotel manager, or vocational program  Occupational History  . Occupation: not employed  Tobacco Use  . Smoking status: Former Smoker    Packs/day: 0.75    Years: 2.00    Pack years: 1.50    Types: Cigarettes    Start date: 07/06/1981    Quit date: 07/07/1983    Years since quitting: 36.0  . Smokeless tobacco: Never Used  Substance and Sexual Activity  . Alcohol use: No    Alcohol/week: 0.0 standard drinks  . Drug use: No  . Sexual activity: Yes    Partners: Male  Other Topics Concern  . Not on file  Social History Narrative   Married and husband has a lot of medical problems, he is working again    Investment banker, operational of Radio broadcast assistant Strain: Medium Risk  . Difficulty of Paying Living Expenses: Somewhat hard  Food Insecurity: No Food Insecurity  . Worried About Charity fundraiser in the Last Year: Never true  . Ran Out of Food in the Last Year: Never true  Transportation Needs: No Transportation Needs  . Lack of Transportation (Medical): No  . Lack of Transportation (Non-Medical): No  Physical Activity: Sufficiently Active  . Days of Exercise per Week: 5 days  . Minutes of Exercise per Session: 30 min  Stress: Stress Concern Present  . Feeling of Stress : To some extent  Social Connections: Not Isolated  . Frequency of Communication with Friends and Family: More than three times a week  . Frequency of Social Gatherings with Friends and Family: More than three times a week  . Attends Religious Services: More than 4 times per year  . Active Member of Clubs or Organizations: Yes  . Attends Archivist Meetings: More than 4 times per year  . Marital Status: Married    Allergies:  Allergies   Allergen Reactions  . Penicillins Anaphylaxis, Hives, Nausea And Vomiting and Swelling    Has patient had a PCN reaction causing immediate rash, facial/tongue/throat swelling, SOB or lightheadedness with hypotension: Yes Has patient had a PCN reaction causing severe rash involving mucus membranes or skin necrosis: Yes Has patient had a PCN reaction that required hospitalization No Has patient had a PCN reaction occurring within the last 10 years: Yes If all of the above answers are "NO", then may proceed with Cephalosporin use.   . Chlorhexidine Gluconate Itching and Rash  . Ciprofloxacin Hives  . Clindamycin/Lincomycin Hives  . Erythromycin Hives and Nausea And Vomiting  . Keflex [Cephalexin] Hives  . Lyrica [Pregabalin]     Dizziness, syncope  . Nitrofurantoin Monohyd Macro Hives and Nausea And Vomiting  . Other Hives  . Sulfa Antibiotics Hives and Nausea And Vomiting    Rapid heart rate  . Tetracyclines & Related Hives  . Adhesive [Tape] Rash  .  Latex Rash  . Vancomycin Itching and Rash    Metabolic Disorder Labs: Lab Results  Component Value Date   HGBA1C 5.3 03/31/2019   MPG 105 03/31/2019   MPG 108 06/17/2018   No results found for: PROLACTIN Lab Results  Component Value Date   CHOL 160 06/17/2018   TRIG 119 06/17/2018   HDL 56 06/17/2018   CHOLHDL 2.9 06/17/2018   VLDL 22 12/31/2016   LDLCALC 82 06/17/2018   LDLCALC 64 12/31/2016   Lab Results  Component Value Date   TSH 1.75 03/31/2019   TSH 2.56 12/31/2016    Therapeutic Level Labs: No results found for: LITHIUM No results found for: VALPROATE No components found for:  CBMZ  Current Medications: Current Outpatient Medications  Medication Sig Dispense Refill  . acetaminophen (TYLENOL) 325 MG tablet Take 650 mg by mouth every 6 (six) hours as needed.    . fluticasone (FLONASE) 50 MCG/ACT nasal spray Place 2 sprays into both nostrils daily. 16 g 2  . hydrOXYzine (ATARAX/VISTARIL) 25 MG tablet Take 1  tablet (25 mg total) by mouth 2 (two) times daily as needed for anxiety. 60 tablet 0  . loratadine (CLARITIN) 10 MG tablet Take 1 tablet (10 mg total) by mouth daily. 90 tablet 3  . montelukast (SINGULAIR) 10 MG tablet Take 1 tablet (10 mg total) by mouth at bedtime. 90 tablet 1  . omeprazole (PRILOSEC) 40 MG capsule Take 1 capsule (40 mg total) by mouth every morning. 90 capsule 1  . prazosin (MINIPRESS) 1 MG capsule Take 1 capsule (1 mg total) by mouth at bedtime. 90 capsule 1  . rOPINIRole (REQUIP) 1 MG tablet TAKE ONE TABLET BY MOUTH EVERY NIGHT AT BEDTIME 62 tablet 0  . SUMAtriptan (IMITREX) 100 MG tablet Take 1 tablet (100 mg total) by mouth every 2 (two) hours as needed for migraine. May repeat in 2 hours if headache persists or recurs. 10 tablet 0  . tizanidine (ZANAFLEX) 2 MG capsule Take 1 capsule (2 mg total) by mouth daily as needed for muscle spasms. 90 capsule 0  . topiramate (TOPAMAX) 100 MG tablet Take 1 tablet (100 mg total) by mouth every evening. 90 tablet 1  . venlafaxine XR (EFFEXOR-XR) 150 MG 24 hr capsule Take 1 capsule (150 mg total) by mouth daily with breakfast. 90 capsule 1  . venlafaxine XR (EFFEXOR-XR) 37.5 MG 24 hr capsule Take 1 capsule (37.5 mg total) by mouth daily with breakfast. To be combined with 150 mg 90 capsule 0   No current facility-administered medications for this visit.     Musculoskeletal: Strength & Muscle Tone: UTA Gait & Station: normal Patient leans: N/A  Psychiatric Specialty Exam: Review of Systems  Psychiatric/Behavioral: Negative for agitation, behavioral problems, confusion, decreased concentration, dysphoric mood, hallucinations, self-injury and sleep disturbance. The patient is not nervous/anxious and is not hyperactive.   All other systems reviewed and are negative.   There were no vitals taken for this visit.There is no height or weight on file to calculate BMI.  General Appearance: Casual  Eye Contact:  Fair  Speech:  Clear and  Coherent  Volume:  Normal  Mood:  Euthymic  Affect:  Congruent  Thought Process:  Goal Directed and Descriptions of Associations: Intact  Orientation:  Full (Time, Place, and Person)  Thought Content: Logical   Suicidal Thoughts:  No  Homicidal Thoughts:  No  Memory:  Immediate;   Fair Recent;   Fair Remote;   Fair  Judgement:  Fair  Insight:  Fair  Psychomotor Activity:  Normal  Concentration:  Concentration: Fair and Attention Span: Fair  Recall:  [employer] of Knowledge: Fair  Language: Fair  Akathisia:  No  Handed:  Right  AIMS (if indicated):Denies tremors, rigidity  Assets:  Communication Skills Desire for Improvement Social Support  ADL's:  Intact  Cognition: WNL  Sleep:  Fair   Screenings: GAD-7     Office Visit from 06/20/2019 in [facility] Office Visit from 03/23/2019 in [facility] Office Visit from 02/22/2019 in [facility] Office Visit from 09/20/2018 in [facility] Office Visit from 08/30/2018 in [facility]  Total GAD-7 Score  2  1  1  11  10     PHQ2-9     Office Visit from 06/20/2019 in [facility] Office Visit from 03/23/2019 in [facility] Office Visit from 02/22/2019 in [facility] Office Visit from 01/12/2019 in [facility] Office Visit from 09/20/2018 in [facility]  PHQ-2 Total Score  1  0  1  2  3   PHQ-9 Total Score  6  2  4  7  12        Assessment and Plan: Anfal is a 54 year old Caucasian female, married, employed, lives in [place], has a history of MDD, GAD, panic attacks, insomnia, GERD, arthritis, chronic pain was evaluated by telemedicine today.  Patient is biologically predisposed given her family history, history of trauma, multiple medical problems.  Patient with psychosocial stressors of financial problems, chronic pain, is currently making progress  on the current medication regimen.  Plan as noted below.  Plan MDD in remission Venlafaxine as prescribed Continue CBT with Ms. Marjie Skiff.  GAD-improving Venlafaxine XR 187.5 mg p.o. daily Continue CBT with her therapist Ms. Marjie Skiff  Panic disorder-improving Venlafaxine as prescribed Hydroxyzine 25 mg p.o. twice daily as needed  Insomnia-improved Prazosin 1 mg p.o. nightly for nightmares-she reports she takes it only as needed   Follow-up in clinic in 2 months or sooner if needed.  March 11 at 10 AM  I have spent atleast 20 minutes non face to face with patient today. More than 50 % of the time was spent for obtaining and to review and separately obtained history , ordering medications and test ,psychoeducation and supportive psychotherapy and care coordination,as well as documenting clinical information in electronic health record. This note was generated in part or whole with voice recognition software. Voice recognition is usually quite accurate but there are transcription errors that can and very often do occur. I apologize for any typographical errors that were not detected and corrected.        Ursula Alert, MD 07/13/2019, 12:53 PM

## 2019-08-14 ENCOUNTER — Telehealth: Payer: Self-pay | Admitting: Psychiatry

## 2019-08-14 ENCOUNTER — Encounter: Payer: Self-pay | Admitting: Family Medicine

## 2019-08-14 DIAGNOSIS — F331 Major depressive disorder, recurrent, moderate: Secondary | ICD-10-CM

## 2019-08-14 DIAGNOSIS — F411 Generalized anxiety disorder: Secondary | ICD-10-CM

## 2019-08-14 DIAGNOSIS — F410 Panic disorder [episodic paroxysmal anxiety] without agoraphobia: Secondary | ICD-10-CM

## 2019-08-14 MED ORDER — VENLAFAXINE HCL ER 37.5 MG PO CP24: 37.5000 mg | ORAL_CAPSULE | Freq: Every day | ORAL | 0 refills | Status: DC

## 2019-08-14 MED ORDER — VENLAFAXINE HCL ER 150 MG PO CP24: 150.0000 mg | ORAL_CAPSULE | Freq: Every day | ORAL | 1 refills | Status: DC

## 2019-08-14 NOTE — Telephone Encounter (Signed)
Return call to patient per request from [place] who said patient had left a message at front desk wanting writer to call her back.  Patient reports she has a court hearing coming up and hence is worried about that.  The court hearing is for child support case.  She has gotten in touch with her therapist and is going to schedule appointments for therapy sessions.  She reports so far she is okay on the venlafaxine however she ran out of the 37.5 mg 2 days ago.  She would like her medications to be refilled.  She does not want any changes with her medications since she is able to cope.    She will reach out if she needs anything else.  Crisis plan discussed with patient.  Patient agrees to go to the nearest emergency department if her symptoms worsen.

## 2019-08-27 ENCOUNTER — Other Ambulatory Visit: Payer: Self-pay | Admitting: Family Medicine

## 2019-08-27 DIAGNOSIS — G43009 Migraine without aura, not intractable, without status migrainosus: Secondary | ICD-10-CM

## 2019-08-31 ENCOUNTER — Other Ambulatory Visit: Payer: Self-pay | Admitting: Family Medicine

## 2019-08-31 DIAGNOSIS — G43009 Migraine without aura, not intractable, without status migrainosus: Secondary | ICD-10-CM

## 2019-08-31 NOTE — Telephone Encounter (Signed)
Requested medications are due for refill today?  NON - DELEGATED MEDICATION.    Requested medications are on active medication list?  MEDICATION Discontinued on 06/20/2019 by Dr. Ancil Boozer.    Last Refill:    Future visit scheduled?  NO  Notes to Clinic:

## 2019-09-14 ENCOUNTER — Encounter: Payer: Self-pay | Admitting: Psychiatry

## 2019-09-14 ENCOUNTER — Other Ambulatory Visit: Payer: Self-pay

## 2019-09-14 ENCOUNTER — Ambulatory Visit (INDEPENDENT_AMBULATORY_CARE_PROVIDER_SITE_OTHER): Payer: BC Managed Care – PPO | Admitting: Psychiatry

## 2019-09-14 ENCOUNTER — Telehealth (HOSPITAL_COMMUNITY): Payer: Self-pay | Admitting: Psychiatry

## 2019-09-14 DIAGNOSIS — F410 Panic disorder [episodic paroxysmal anxiety] without agoraphobia: Secondary | ICD-10-CM

## 2019-09-14 DIAGNOSIS — F411 Generalized anxiety disorder: Secondary | ICD-10-CM | POA: Diagnosis not present

## 2019-09-14 DIAGNOSIS — F5105 Insomnia due to other mental disorder: Secondary | ICD-10-CM | POA: Diagnosis not present

## 2019-09-14 DIAGNOSIS — F331 Major depressive disorder, recurrent, moderate: Secondary | ICD-10-CM

## 2019-09-14 MED ORDER — PRAZOSIN HCL 1 MG PO CAPS: 1.0000 mg | ORAL_CAPSULE | Freq: Every day | ORAL | 1 refills | Status: DC

## 2019-09-14 MED ORDER — VENLAFAXINE HCL ER 75 MG PO CP24: 75.0000 mg | ORAL_CAPSULE | Freq: Every day | ORAL | 0 refills | Status: DC

## 2019-09-14 MED ORDER — HYDROXYZINE HCL 25 MG PO TABS: 25.0000 mg | ORAL_TABLET | Freq: Two times a day (BID) | ORAL | 1 refills | Status: DC | PRN

## 2019-09-14 NOTE — Progress Notes (Signed)
Provider Location : [facility] Patient Location : Home  Virtual Visit via Video Note  I connected with Kelly [name] on 09/14/19 at 10:00 AM EST by a video enabled telemedicine application and verified that I am speaking with the correct person using two identifiers.   I discussed the limitations of evaluation and management by telemedicine and the availability of in person appointments. The patient expressed understanding and agreed to proceed.      I discussed the assessment and treatment plan with the patient. The patient was provided an opportunity to ask questions and all were answered. The patient agreed with the plan and demonstrated an understanding of the instructions.   The patient was advised to call back or seek an in-person evaluation if the symptoms worsen or if the condition fails to improve as anticipated.  Ivey MD  OP Progress Note  09/14/2019 5:21 PM Kelly [name]  MRN:  [id number]  Chief Complaint:  Chief Complaint    Follow-up     HPI: Kelly [name] is a 54 year old Caucasian female who is married, lives in [place], has a history of depression, anxiety disorder, panic disorder, insomnia, gastroesophageal reflux disease, lumbar disc problems, migraine headaches was evaluated by telemedicine today.  Patient today reports she recently had court hearing for child support of her adopted daughter who is currently not under her custody.  She reports it did not go in her favor.  As soon as she walked out of the court she felt dizzy and almost passed out and had to sit down.  She reports her husband also was emotionally upset and felt dizzy while he was trying to drive out of there.  Patient reports once she was in the car she felt like she wanted to drop out of the car just by opening the door.  She reports she however did not go through with that thought since she did not want to hurt her husband.  That night she called the suicide hotline since her therapist was not reachable.   She reports she just wanted to talk to someone that night and that did help.  This was 2 weeks ago.  She reports the next day she was able to get in touch with her therapist Ms. Marjie Skiff.  She has been following up with her therapist ever since.  She reports she currently does not have any suicidal thoughts.  She reports she does not want to hurt her family.  She however reports she continues to feel anxious and depressed about the whole situation.  She often thinks about wanting to just escape from all this.  She would not mind if she were not alive today but would not do anything to her self either.  She reports the increased dosage of Effexor is not helping much with her current anxiety.  She is interested in increasing the dosage further.  She initially had sleep issues however she is doing well with that now.  Patient denies any perceptual disturbances, homicidality at this time.  Patient denies any other concerns today. Visit Diagnosis:    ICD-10-CM   1. MDD (major depressive disorder), recurrent episode, moderate (HCC)  F33.1 venlafaxine XR (EFFEXOR XR) 75 MG 24 hr capsule  2. GAD (generalized anxiety disorder)  F41.1 venlafaxine XR (EFFEXOR XR) 75 MG 24 hr capsule    hydrOXYzine (ATARAX/VISTARIL) 25 MG tablet  3. Panic disorder  F41.0   4. Insomnia due to mental condition  F51.05 prazosin (MINIPRESS) 1 MG capsule  Past Psychiatric History: I have reviewed past psychiatric history from my progress note on 10/25/2018.  Past trials of medications like Paxil, trazodone, hydroxyzine.  Past Medical History:  Past Medical History:  Diagnosis Date  . Allergy   . Anxiety   . Depression   . GERD (gastroesophageal reflux disease)   . History of kidney stones   . Insomnia   . Intermittent low back pain   . Migraines   . Osteoarthritis   . Recurrent UTI   . Restless leg syndrome   . Sciatica of right side   . Symptomatic menopausal or female climacteric states   . Vertigo   .  Vitamin D deficiency     Past Surgical History:  Procedure Laterality Date  . BACK SURGERY    . COLONOSCOPY WITH PROPOFOL N/A 02/03/2019   Procedure: COLONOSCOPY WITH PROPOFOL;  Surgeon: Jonathon Bellows, MD;  Location: [facility] ENDOSCOPY;  Service: Gastroenterology;  Laterality: N/A;  . COLONOSCOPY WITH PROPOFOL N/A 03/10/2019   Procedure: COLONOSCOPY WITH PROPOFOL;  Surgeon: Jonathon Bellows, MD;  Location: [facility] ENDOSCOPY;  Service: Gastroenterology;  Laterality: N/A;  . DIAGNOSTIC LAPAROSCOPY    . DILATION AND CURETTAGE OF UTERUS    . KNEE ARTHROSCOPY WITH MEDIAL MENISECTOMY Left 01/14/2017   Procedure: KNEE ARTHROSCOPY WITH MEDIAL AND LATERAL MENISECTOMY;  Surgeon: Hessie Knows, MD;  Location: [facility] ORS;  Service: Orthopedics;  Laterality: Left;  Partial Knee menisectomy  . KNEE SURGERY Left 05/2011   Dr. Rudene Christians- Arthroscopic  . LUMBAR LAMINECTOMY/DECOMPRESSION MICRODISCECTOMY N/A 09/16/2016   Procedure: LUMBAR LAMINECTOMY/DECOMPRESSION MICRODISCECTOMY 1 LEVEL L5-S1;  Surgeon: Meade Maw, MD;  Location: [facility] ORS;  Service: Neurosurgery;  Laterality: N/A;  . TONSILLECTOMY AND ADENOIDECTOMY    . URETHRAL STRICTURE DILATATION      Family Psychiatric History: I have reviewed family psychiatric history from my progress note on 10/25/2018  Family History:  Family History  Problem Relation Age of Onset  . Cancer Paternal Grandmother   . Anxiety disorder Cousin   . Depression Cousin     Social History: I have reviewed social history from my progress note on 10/25/2018 Social History   Socioeconomic History  . Marital status: Married    Spouse name: Jenny Reichmann  . Number of children: 2  . Years of education: 69  . Highest education level: Associate degree: occupational, Hotel manager, or vocational program  Occupational History  . Occupation: not employed  Tobacco Use  . Smoking status: Former Smoker    Packs/day: 0.75    Years: 2.00    Pack years: 1.50    Types: Cigarettes    Start date: 07/06/1981     Quit date: 07/07/1983    Years since quitting: 36.2  . Smokeless tobacco: Never Used  Substance and Sexual Activity  . Alcohol use: No    Alcohol/week: 0.0 standard drinks  . Drug use: No  . Sexual activity: Yes    Partners: Male  Other Topics Concern  . Not on file  Social History Narrative   Married and husband has a lot of medical problems, he is working again    Investment banker, operational of Radio broadcast assistant Strain: Medium Risk  . Difficulty of Paying Living Expenses: Somewhat hard  Food Insecurity: No Food Insecurity  . Worried About Charity fundraiser in the Last Year: Never true  . Ran Out of Food in the Last Year: Never true  Transportation Needs: No Transportation Needs  . Lack of Transportation (Medical): No  . Lack of Transportation (  Non-Medical): No  Physical Activity: Sufficiently Active  . Days of Exercise per Week: 5 days  . Minutes of Exercise per Session: 30 min  Stress: Stress Concern Present  . Feeling of Stress : To some extent  Social Connections: Not Isolated  . Frequency of Communication with Friends and Family: More than three times a week  . Frequency of Social Gatherings with Friends and Family: More than three times a week  . Attends Religious Services: More than 4 times per year  . Active Member of Clubs or Organizations: Yes  . Attends Archivist Meetings: More than 4 times per year  . Marital Status: Married    Allergies:  Allergies  Allergen Reactions  . Penicillins Anaphylaxis, Hives, Nausea And Vomiting and Swelling    Has patient had a PCN reaction causing immediate rash, facial/tongue/throat swelling, SOB or lightheadedness with hypotension: Yes Has patient had a PCN reaction causing severe rash involving mucus membranes or skin necrosis: Yes Has patient had a PCN reaction that required hospitalization No Has patient had a PCN reaction occurring within the last 10 years: Yes If all of the above answers are "NO", then may  proceed with Cephalosporin use.   . Chlorhexidine Gluconate Itching and Rash  . Ciprofloxacin Hives  . Clindamycin/Lincomycin Hives  . Erythromycin Hives and Nausea And Vomiting  . Keflex [Cephalexin] Hives  . Lyrica [Pregabalin]     Dizziness, syncope  . Nitrofurantoin Monohyd Macro Hives and Nausea And Vomiting  . Other Hives  . Sulfa Antibiotics Hives and Nausea And Vomiting    Rapid heart rate  . Tetracyclines & Related Hives  . Adhesive [Tape] Rash  . Latex Rash  . Vancomycin Itching and Rash    Metabolic Disorder Labs: Lab Results  Component Value Date   HGBA1C 5.3 03/31/2019   MPG 105 03/31/2019   MPG 108 06/17/2018   No results found for: PROLACTIN Lab Results  Component Value Date   CHOL 160 06/17/2018   TRIG 119 06/17/2018   HDL 56 06/17/2018   CHOLHDL 2.9 06/17/2018   VLDL 22 12/31/2016   LDLCALC 82 06/17/2018   LDLCALC 64 12/31/2016   Lab Results  Component Value Date   TSH 1.75 03/31/2019   TSH 2.56 12/31/2016    Therapeutic Level Labs: No results found for: LITHIUM No results found for: VALPROATE No components found for:  CBMZ  Current Medications: Current Outpatient Medications  Medication Sig Dispense Refill  . acetaminophen (TYLENOL) 325 MG tablet Take 650 mg by mouth every 6 (six) hours as needed.    . Cholecalciferol 125 MCG (5000 UT) capsule Take by mouth.    . fluticasone (FLONASE) 50 MCG/ACT nasal spray Place 2 sprays into both nostrils daily. 16 g 2  . hydrOXYzine (ATARAX/VISTARIL) 25 MG tablet Take 1 tablet (25 mg total) by mouth 2 (two) times daily as needed for anxiety. 60 tablet 1  . loratadine (CLARITIN) 10 MG tablet Take 1 tablet (10 mg total) by mouth daily. 90 tablet 3  . montelukast (SINGULAIR) 10 MG tablet Take 1 tablet (10 mg total) by mouth at bedtime. 90 tablet 1  . omeprazole (PRILOSEC) 40 MG capsule Take 1 capsule (40 mg total) by mouth every morning. 90 capsule 1  . prazosin (MINIPRESS) 1 MG capsule Take 1 capsule (1 mg  total) by mouth at bedtime. 90 capsule 1  . rOPINIRole (REQUIP) 1 MG tablet TAKE ONE TABLET BY MOUTH EVERY NIGHT AT BEDTIME 62 tablet 0  . SUMAtriptan (  IMITREX) 100 MG tablet Take 1 tablet (100 mg total) by mouth every 2 (two) hours as needed for migraine. May repeat in 2 hours if headache persists or recurs. 10 tablet 0  . tizanidine (ZANAFLEX) 2 MG capsule Take 1 capsule (2 mg total) by mouth daily as needed for muscle spasms. 90 capsule 0  . topiramate (TOPAMAX) 100 MG tablet Take 1 tablet (100 mg total) by mouth every evening. 90 tablet 1  . venlafaxine XR (EFFEXOR XR) 75 MG 24 hr capsule Take 1 capsule (75 mg total) by mouth daily with breakfast. To be combined with 150 mg 90 capsule 0  . venlafaxine XR (EFFEXOR-XR) 150 MG 24 hr capsule Take 1 capsule (150 mg total) by mouth daily with breakfast. 90 capsule 1   No current facility-administered medications for this visit.     Musculoskeletal: Strength & Muscle Tone: UTA Gait & Station: normal Patient leans: N/A  Psychiatric Specialty Exam: Review of Systems  Psychiatric/Behavioral: Positive for dysphoric mood. Negative for agitation, behavioral problems, confusion, decreased concentration, hallucinations, self-injury, sleep disturbance and suicidal ideas. The patient is nervous/anxious. The patient is not hyperactive.   All other systems reviewed and are negative.   There were no vitals taken for this visit.There is no height or weight on file to calculate BMI.  General Appearance: Casual  Eye Contact:  Fair  Speech:  Clear and Coherent  Volume:  Normal  Mood:  Anxious and Dysphoric  Affect:  Congruent  Thought Process:  Goal Directed and Descriptions of Associations: Intact  Orientation:  Full (Time, Place, and Person)  Thought Content: Logical   Suicidal Thoughts:  No  Homicidal Thoughts:  No  Memory:  Immediate;   Fair Recent;   Fair Remote;   Fair  Judgement:  Fair  Insight:  Fair  Psychomotor Activity:  Normal   Concentration:  Concentration: Fair and Attention Span: Fair  Recall:  [employer] of Knowledge: Fair  Language: Fair  Akathisia:  No  Handed:  Right  AIMS (if indicated): UTA  Assets:  Communication Skills Desire for Improvement Housing Social Support  ADL's:  Intact  Cognition: WNL  Sleep:  Improving   Screenings: GAD-7     Office Visit from 06/20/2019 in [facility] Office Visit from 03/23/2019 in [facility] Office Visit from 02/22/2019 in [facility] Office Visit from 09/20/2018 in [facility] Office Visit from 08/30/2018 in [facility]  Total GAD-7 Score  2  1  1  11  10     PHQ2-9     Office Visit from 06/20/2019 in [facility] Office Visit from 03/23/2019 in [facility] Office Visit from 02/22/2019 in [facility] Office Visit from 01/12/2019 in [facility] Office Visit from 09/20/2018 in [facility]  PHQ-2 Total Score  1  0  1  2  3   PHQ-9 Total Score  6  2  4  7  12        Assessment and Plan: Kelly [name] is a 54 year old Caucasian female, married, employed, lives in [place], has a history of MDD, GAD, panic attacks, insomnia, GAD, arthritis, chronic pain was evaluated by telemedicine today.  Patient is biologically predisposed given her family history, history of trauma, multiple medical problems.  Patient with psychosocial stressors of legal issues, financial problems, chronic pain is currently struggling with worsening depressive symptoms more so because of her recent psychosocial stressors  and court hearing.  She will benefit from medication readjustment and psychotherapy sessions.  Risk factors for suicide-current mental health problems, current legal issues, recent suicidal thoughts, white race, may have family history of suicide attempts in her relatives, past hx of trauma. Protective  factors are she is very motivated to get help and is compliant with treatment, motivated to start intensive outpatient program, agrees to go and get help by admitting herself to hospital if needed, currently denies any active suicidal thoughts or plan, denies any past history of suicide attempts, has good social support system. Acute risk for suicide is hence low.  Plan MDD-unstable Increase venlafaxine to 225 mg p.o. daily. Continue CBT with Ms. Marjie Skiff. Will refer her to [facility] for IOP.  GAD-unstable Increase venlafaxine as prescribed. Continue CBT-referral to IOP.  Panic disorder-some progress Venlafaxine as prescribed Hydroxyzine 25 mg p.o. twice daily as needed  Insomnia-improving Prazosin 1 mg p.o. nightly for nightmares.  Crisis plan discussed with patient.  Patient agrees to call 911 or go to the nearest emergency department if her symptoms worsen.  Follow-up in clinic in 2 to 3 weeks or sooner if needed.  I have spent atleast 30 minutes non face to face with patient today. More than 50 % of the time was spent for preparing to see the patient ( e.g., review of test, records ), obtaining and to review and separately obtained history , ordering medications and test ,psychoeducation and supportive psychotherapy and care coordination,as well as documenting clinical information in electronic health record,interpreting results of test and communication of results This note was generated in part or whole with voice recognition software. Voice recognition is usually quite accurate but there are transcription errors that can and very often do occur. I apologize for any typographical errors that were not detected and corrected.       Ursula Alert, MD 09/14/2019, 5:21 PM

## 2019-09-14 NOTE — Telephone Encounter (Signed)
D:  Dr. Shea Evans referred pt to [facility].  A:  Placed call to orient pt and provide her with a start date, but patient states she would like to contact her insurance company to verify her benefits.  "I need to find this out first because I am having financial difficulties."  Encouraged pt to contact case manager after she calls the insurance co. Inform Dr. Shea Evans.   R:  Pt receptive.

## 2019-09-15 ENCOUNTER — Other Ambulatory Visit (HOSPITAL_COMMUNITY): Payer: BC Managed Care – PPO | Attending: Psychiatry | Admitting: Licensed Clinical Social Worker

## 2019-09-15 DIAGNOSIS — G8929 Other chronic pain: Secondary | ICD-10-CM | POA: Insufficient documentation

## 2019-09-15 DIAGNOSIS — R45851 Suicidal ideations: Secondary | ICD-10-CM | POA: Diagnosis not present

## 2019-09-15 DIAGNOSIS — F419 Anxiety disorder, unspecified: Secondary | ICD-10-CM | POA: Diagnosis not present

## 2019-09-15 DIAGNOSIS — F411 Generalized anxiety disorder: Secondary | ICD-10-CM | POA: Insufficient documentation

## 2019-09-15 DIAGNOSIS — M549 Dorsalgia, unspecified: Secondary | ICD-10-CM | POA: Diagnosis not present

## 2019-09-15 DIAGNOSIS — F329 Major depressive disorder, single episode, unspecified: Secondary | ICD-10-CM | POA: Diagnosis not present

## 2019-09-15 DIAGNOSIS — G2581 Restless legs syndrome: Secondary | ICD-10-CM | POA: Insufficient documentation

## 2019-09-15 DIAGNOSIS — F410 Panic disorder [episodic paroxysmal anxiety] without agoraphobia: Secondary | ICD-10-CM

## 2019-09-15 DIAGNOSIS — F331 Major depressive disorder, recurrent, moderate: Secondary | ICD-10-CM

## 2019-09-15 NOTE — Progress Notes (Signed)
Virtual Visit via Video Note  I connected with Kelly [name] on @TODAY @ at  9:00 AM EST by a video enabled telemedicine application and verified that I am speaking with the correct person using two identifiers.   I discussed the limitations of evaluation and management by telemedicine and the availability of in person appointments. The patient expressed understanding and agreed to proceed.  I discussed the assessment and treatment plan with the patient. The patient was provided an opportunity to ask questions and all were answered. The patient agreed with the plan and demonstrated an understanding of the instructions.   The patient was advised to call back or seek an in-person evaluation if the symptoms worsen or if the condition fails to improve as anticipated.  I provided 60 minutes of non-face-to-face time during this encounter.     Comprehensive Clinical Assessment (CCA) Note  09/15/2019 Kelly [name] [id number]  Visit Diagnosis:   No diagnosis found.    CCA Part One  Part One has been completed on paper by the patient.  (See scanned document in Chart Review)  CCA Part Two A  Intake/Chief Complaint:  CCA Intake With Chief Complaint CCA Part Two Date: 09/15/19 CCA Part Two Time: [id number] Chief Complaint/Presenting Problem: This is a 54 yr old, married, unemployed Caucasian female who was referred per Dr. Shea Evans; treatment for worsening depressive and anxiety symptoms.  Admits to passive SI (denies a plan or intent).  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  "We came across two young girls (ages 38 and 15) who we adopted."  Pt reports they started having a lot of behavioral issues with the girls(ie. they were contacting their biological parents, destroying the home, stealing money, physically abusive towards pt).  According to pt, they had the youngest until age 50 and the  55 yr old moved out.  "I think the 55 yr old is back in town and not far from our house."  Pt states she recently had a court hearing for child support of her adopted daughter who isn't under her custody.  Apparently, it didn't go in her favor.  "We have to prove that the 55 yr old has graduated from high school or that she moved out of state."  Pt reports after court, she became very dizzy and felt faint.  2) Husband has been losing a lot of weight and has been going to various doctors.  He is unable to work; so they are having financial issues.  Pt denies any hx of psychiatric admissions.  Has been seeing Dr. Shea Evans and Marjie Skiff (therapist).  Denies any past suicide attempts or gestures.  Family hx:  Various Paternal Aunts/Uncles (depression and suicided). Patients Currently Reported Symptoms/Problems: Passive SI, decreased appetite, poor sleep, poor concentration, sadness, anxious, isolative, decreased motivation, racing thoughts, tearful, anhedonia Collateral Involvement: Dr. Charlcie Cradle notes Individual's Strengths: "I am dependable" Individual's Preferences: "I want to work on not jumping to conclusions." Type of Services Patient Feels Are Needed: MH-IOP  Mental Health Symptoms Depression:  Depression: Change in energy/activity, Difficulty Concentrating, Fatigue, Increase/decrease in appetite, Sleep (too much or little), Tearfulness  Mania:  Mania: N/A  Anxiety:   Anxiety: Worrying  Psychosis:     Trauma:  Trauma: N/A  Obsessions:  Obsessions: N/A  Compulsions:  Compulsions: N/A  Inattention:  Inattention: N/A  Hyperactivity/Impulsivity:  Hyperactivity/Impulsivity: N/A  Oppositional/Defiant Behaviors:  Oppositional/Defiant Behaviors: N/A  Borderline Personality:  Emotional Irregularity: N/A  Other Mood/Personality Symptoms:      Mental Status Exam Appearance and self-care  Stature:  Stature: Average  Weight:  Weight: Average weight  Clothing:  Clothing: Casual  Grooming:  Grooming:  Normal  Cosmetic use:  Cosmetic Use: None  Posture/gait:  Posture/Gait: Normal  Motor activity:  Motor Activity: Not Remarkable  Sensorium  Attention:  Attention: Normal  Concentration:  Concentration: Variable  Orientation:  Orientation: X5  Recall/memory:  Recall/Memory: Normal  Affect and Mood  Affect:  Affect: Labile  Mood:  Mood: Anxious  Relating  Eye contact:  Eye Contact: Normal  Facial expression:  Facial Expression: Responsive  Attitude toward examiner:  Attitude Toward Examiner: Cooperative  Thought and Language  Speech flow: Speech Flow: Normal  Thought content:  Thought Content: Appropriate to mood and circumstances  Preoccupation:     Hallucinations:     Organization:     Transport planner of Knowledge:  Fund of Knowledge: Average  Intelligence:  Intelligence: Average  Abstraction:  Abstraction: Normal  Judgement:  Judgement: Fair  Art therapist:  Reality Testing: Adequate  Insight:  Insight: Gaps  Decision Making:  Decision Making: Vacilates  Social Functioning  Social Maturity:  Social Maturity: Isolates  Social Judgement:  Social Judgement: Normal  Stress  Stressors:  Stressors: Family conflict, Grief/losses, Money  Coping Ability:  Coping Ability: English as a second language teacher Deficits:     Supports:      Family and Psychosocial History: Family history Marital status: Married Number of Years Married: 60 What types of issues is patient dealing with in the relationship?: States he is supportive What is your sexual orientation?: heterosexual Does patient have children?: Yes How many children?: 2 How is patient's relationship with their children?: Two sons (ages 69 and 33)  Childhood History:  Childhood History By whom was/is the patient raised?: Chief of Staff and step-parent Additional childhood history information: Born in [place], [place].  Parents married at a young age.  Both worked within [employer].  She was their only child.  Parents divorced  when pt was age 34.  Pt states she's not quite sure why.  "I keep getting different answers."  Pt lived with mom for one yr in [facility] after the divorce and then parents shared custody.  Pt states her mother's IQ was that of a teenagers than a adult.  Pt reports having no problems in school.  Denies any trauma or abuse. Patient's description of current relationship with people who raised him/her: "I am like their parent." Does patient have siblings?: No Did patient suffer any verbal/emotional/physical/sexual abuse as a child?: No Did patient suffer from severe childhood neglect?: No Has patient ever been sexually abused/assaulted/raped as an adolescent or adult?: No Was the patient ever a victim of a crime or a disaster?: No Witnessed domestic violence?: No Has patient been effected by domestic violence as an adult?: No  CCA Part Two B  Employment/Work Situation: Employment / Work Copywriter, advertising Employment situation: Product manager job has been impacted by current illness: No What is the longest time patient has a held a job?: 30 yrs 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 Did You Have An Individualized Education Program (IIEP): No Did You Have Any Difficulty At School?: No  Religion: Religion/Spirituality Are You A Religious Person?: Yes What is Your Religious Affiliation?: Christian  Leisure/Recreation: Leisure / Recreation Leisure and Hobbies: Reading and Nurse, mental health  Exercise/Diet: Exercise/Diet Do You Exercise?: No Have You Gained or Lost A Significant Amount of Weight in the Past Six Months?: No Do You Follow a  Special Diet?: No Do You Have Any Trouble Sleeping?: Yes Explanation of Sleeping Difficulties: C/O awakenings  CCA Part Two C  Alcohol/Drug Use: Alcohol / Drug Use Pain Medications: cc: mar Prescriptions: cc: mar Over the Counter: cc: mar History of alcohol / drug use?: No history of alcohol / drug abuse                      CCA Part Three  ASAM's:  Six Dimensions of Multidimensional Assessment  Dimension 1:  Acute Intoxication and/or Withdrawal Potential:     Dimension 2:  Biomedical Conditions and Complications:     Dimension 3:  Emotional, Behavioral, or Cognitive Conditions and Complications:     Dimension 4:  Readiness to Change:     Dimension 5:  Relapse, Continued use, or Continued Problem Potential:     Dimension 6:  Recovery/Living Environment:      Substance use Disorder (SUD)    Social Function:  Social Functioning Social Maturity: Isolates Social Judgement: Normal  Stress:  Stress Stressors: Family conflict, Grief/losses, Money Coping Ability: Overwhelmed Patient Takes Medications The Way The Doctor Instructed?: Yes Priority Risk: Moderate Risk  Risk Assessment- Self-Harm Potential: Risk Assessment For Self-Harm Potential Thoughts of Self-Harm: Vague current thoughts Method: No plan Availability of Means: No access/NA Additional Information for Self-Harm Potential: Family History of Suicide Additional Comments for Self-Harm Potential: Passive SI; no plan or intent.  Pt able to contract for safety.  Risk Assessment -Dangerous to Others Potential: Risk Assessment For Dangerous to Others Potential Method: No Plan Availability of Means: No access or NA Intent: Vague intent or NA Notification Required: No need or identified person  DSM5 Diagnoses: Patient Active Problem List   Diagnosis Date Noted  . Lumbar spondylosis 05/01/2019  . Cervicalgia 05/01/2019  . Encounter for screening colonoscopy   . Polyp of sigmoid colon   . GAD (generalized  anxiety disorder) 02/28/2019  . Panic disorder 02/28/2019  . Insomnia due to mental condition 02/28/2019  . Morbid obesity ([place]) 07/26/2017  . Spondylolisthesis of lumbosacral region 02/11/2016  . Hematuria 04/09/2015  . Hyperglycemia 04/06/2015  . Right lumbar radiculitis 12/28/2014  . MDD (major depressive disorder), recurrent episode, moderate ([place]) 12/27/2014  . Gastro-esophageal reflux disease without esophagitis 12/27/2014  . Bulge of lumbar disc without myelopathy 12/27/2014  . Dysmetabolic syndrome [id number]  . Migraine without aura and responsive to treatment 12/27/2014  . Osteoarthrosis 12/27/2014  . Obesity (BMI 30-39.9) 12/27/2014  . Restless leg 12/27/2014  . Allergic rhinitis, seasonal 12/27/2014    Patient Centered Plan: Patient is on the following Treatment Plan(s):  Anxiety and Depression  Recommendations for Services/Supports/Treatments: Recommendations for Services/Supports/Treatments Recommendations For Services/Supports/Treatments: IOP (Intensive Outpatient Program)  Treatment Plan Summary:  Oriented pt to virtual MH-IOP.  Encouraged pt to verify her benefits.  Pt gave verbal consent for treatment to release chart information to referred providers and to complete any forms if needed.  Pt also gave consent for attending group virtually d/t COVID-19 social distancing restrictions.  Encouraged support groups.  F/U  with Dr. Shea Evans and Marjie Skiff (therapist).  Referrals to Alternative Service(s): Referred to Alternative Service(s):   Place:   Date:   Time:    Referred to Alternative Service(s):   Place:   Date:   Time:    Referred to Alternative Service(s):   Place:   Date:   Time:    Referred to Alternative Service(s):   Place:   Date:   Time:     Dellia Nims, M.Ed,CNA

## 2019-09-15 NOTE — Progress Notes (Signed)
Virtual Visit via Video Note   I connected with Delena Serve on 09/15/19 at 9:00 AM EST by a video enabled telemedicine application and verified that I am speaking with the correct person using two identifiers.   Location: Patient: Patient Home Provider: [place] Office   Case Manager discussed the limitations of evaluation and management by telemedicine and the availability of in person appointments during orientation. The patient expressed understanding and agreed to proceed.   History of Present Illness: MDD, GAD, Panic disorder    Observations/Objective: Case Manager checked in with all participants to review discharge dates, insurance authorizations, work-related documents and needs for the treatment team. Counselor facilitated a check-in with group members to gauge mood and current functioning as well as identify recent progress towards treatment goals.  Devynne reported scores of 8/10 for depression and 9/10 for anxiety today.  Nyima was alert, oriented x5, with no evidence or self-report of SI/HI or A/V H.  She reported that being in a group is a new experience for her and this is one reason she is feeling more anxious.  Ova reported that her struggle has been letting go of the past, but she has been working on becoming more grounded and focused on the present by using skills like deep breathing, along with an 'insight timer' on her phone.  Oaklee reported that she has also been practicing guided meditation and enjoys imagining trips to the beach or mountains.     Counselor introduced topic of self-esteem today and defined this as the value an individual places on oneself, based upon assessment of personal worth as a human being and approval/disapproval of one's behavior. Counselor asked members to assess their level of self-esteem at this time based upon common indicators of high self-esteem, including: accepting oneself unconditionally;  having self-respect and deep seated belief  that one matters; being unaffected by other people's opinions/criticisms; and showing good control over emotions.  Counselor also explained concept of one's inner critic which serves to highlight faults and minimize strengths, directly influencing low sense of self-esteem.  Lux reported that she perceives self-esteem as how one presents themselves each day, and feels like someone confident would be smiling or appear happy to others.  Letesha reported that she used to have more self-esteem, but toxic influences made her question herself and her abilities.  She stated "Now I realize I was as good as I thought I was.  I'm trying to live life on my terms now".  Zella reported that she does find it hard to be assertive and practice healthy boundaries at times, as she likes helping people and thinks of herself as compassionate, but finds herself 'always on call' to help other people, which puts her own needs to the side.  Jaini reported that she also has an inner critic which makes her question her value and reinforces this tendency to put others first.    Counselor then provided handout on 'strengths and qualities', which featured questions to guide discussion and increase awareness of each member's unique individual abilities which could reinforce higher self-esteem. Examples of questions included: 'things I am good at', 'challenges I have overcome', and 'what I like about myself'.   Atenea reported that she is good with parenting abilities, which is why she is asked to help babysit frequently.  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  Laira reported that  one challenge she reflects on as giving her strength involves going back to school, as she had some self-doubt initially, but recalled how in 9th grade she had to move states and start over by herself in a new place, which was equally  difficult, and showed resilience.      Assessment and Plan: Counselor recommends that patient remains in IOP treatment to better manage mental health symptoms and continue to address treatment plan goals. Counselor recommends adherence to crisis/safety plan, taking medications as prescribed and following up with medical professionals if any issues arise.    Follow Up Instructions: Counselor will send Webex link for next session.  The patient was advised to call back or seek an in-person evaluation if the symptoms worsen or if the condition fails to improve as anticipated.   I provided 180 minutes of non-face-to-face time during this encounter.     Shade Flood, LCSW, LCAS

## 2019-09-16 ENCOUNTER — Encounter (INDEPENDENT_AMBULATORY_CARE_PROVIDER_SITE_OTHER): Payer: Self-pay

## 2019-09-18 ENCOUNTER — Other Ambulatory Visit: Payer: Self-pay

## 2019-09-18 ENCOUNTER — Other Ambulatory Visit (HOSPITAL_COMMUNITY): Payer: BC Managed Care – PPO | Admitting: Licensed Clinical Social Worker

## 2019-09-18 DIAGNOSIS — G8929 Other chronic pain: Secondary | ICD-10-CM | POA: Diagnosis not present

## 2019-09-18 DIAGNOSIS — F329 Major depressive disorder, single episode, unspecified: Secondary | ICD-10-CM | POA: Diagnosis not present

## 2019-09-18 DIAGNOSIS — F419 Anxiety disorder, unspecified: Secondary | ICD-10-CM | POA: Diagnosis not present

## 2019-09-18 DIAGNOSIS — R45851 Suicidal ideations: Secondary | ICD-10-CM | POA: Diagnosis not present

## 2019-09-18 DIAGNOSIS — G2581 Restless legs syndrome: Secondary | ICD-10-CM | POA: Diagnosis not present

## 2019-09-18 DIAGNOSIS — F410 Panic disorder [episodic paroxysmal anxiety] without agoraphobia: Secondary | ICD-10-CM

## 2019-09-18 DIAGNOSIS — F331 Major depressive disorder, recurrent, moderate: Secondary | ICD-10-CM

## 2019-09-18 DIAGNOSIS — F411 Generalized anxiety disorder: Secondary | ICD-10-CM | POA: Diagnosis not present

## 2019-09-18 DIAGNOSIS — M549 Dorsalgia, unspecified: Secondary | ICD-10-CM | POA: Diagnosis not present

## 2019-09-18 NOTE — Progress Notes (Signed)
Virtual Visit via Video Note I connected with Kelly [name] on 09/18/19 at 9:00 AM EST by a video enabled telemedicine application and verified that I am speaking with the correct person using two identifiers.   Location: Patient: Patient Home Provider: [place] Office   Case Manager discussed the limitations of evaluation and management by telemedicine and the availability of in person appointments during orientation. The patient expressed understanding and agreed to proceed.   History of Present Illness: MDD, GAD, Panic disorder    Observations/Objective: Case Manager checked in with all participants to review discharge dates, insurance authorizations, work-related documents and needs for the treatment team. Counselor facilitated a check-in with group members to gauge mood and current functioning as well as identify recent progress towards treatment goals.  Kelly [name] reported scores of 8/10 for depression and 10/10 for anxiety today.  Kelly [name] presented as alert, oriented x5, with no evidence or self-report of SI/HI or A/V H.  She reported that her medications were recently increased following conversation with her psychiatrist and hopes this will help in symptom relief.  Kelly [name] reported that she had a good weekend visiting her grandchildren, and it was good to get out of the house.  Kelly [name] reported that she has struggled with sleep for some time now and medication, as well as suggested sleep hygiene techniques have not appeared to help her stay asleep.  Kelly [name] stated "I need a good schedule".  She reported that she continues working on boundaries as well to avoid overloading herself.       Counselor introduced topic of stress management today.  Counselor provided definition of stress as feeling tense, overwhelmed, worn out, and/or exhausted, and noted that in small amounts, stress can be motivating until things become too overwhelming to manage.  Counselor also explained how stress can be acute  (brief but intense) or chronic (long-lasting) and this can impact the severity of symptoms one can experience in the physical, emotional, and behavioral categories.  Counselor inquired about members' specific stressors, how long they have been prevalent, and the various symptoms that tend to manifest as a result.  Counselor also explained that research has shown a strong support network composed of trusted family, friends, or community members can increase resilience in times of stress, and inquired about who members can reach out to for help in managing stressors.  Counselor encouraged members to consider discussing stressor 'red flags' with their close supports that can be monitored and strategies for assisting them in times of crisis.  Kelly [name] reported that she can see stress as a motivator for her at times, such as when preparing for a vacation, although lately her stress has mostly been negative, and heavily influenced by a custody battle she has been involved in for some time.  She reported that she spent much of the weekend working on a motion for court, and hopes this will move things towards resolution soon.  Kelly [name] reported that some acute stressors which stand out include going to get her license renewed, making doctor's appointments, and taking any tests.  She noted that some additional chronic stressors besides the custody case include grief and loss of loved ones, which was particularly difficult in her previous job, as patients were older and stated "I have a really soft heart for people".  Kelly [name] reported that symptoms of stress include physical signs such as sweatiness, indigestion, and headaches; emotional signs such as feeling down and unmotivated; and behavioral signs such as procrastination, decreased food intake, and increased isolation.  Kelly [name] reported that she has two close friends she can speak with about stress which is helpful since she does not find her husband is able to relate.  She  reported that one friend she has known for 30 years and checks in on her regularly, and the other friend has been through similar issues and can provide relevant advice when needed.  Kelly [name] admitted that she tells people she is fine all the time when she is not, and fears making them worry too much, so she is trying to practice being more open as a result.       Counselor ended session by acknowledging a graduating group member by prompting graduating member to reflect on progress made, takeaways from treatment and plan for stepping down. Counselor and group members shared observations of growth, encouragement and support as she transitions out of the program.   Assessment and Plan: Counselor recommends that patient remains in IOP treatment to better manage mental health symptoms and continue to address treatment plan goals. Counselor recommends adherence to crisis/safety plan, taking medications as prescribed and following up with medical professionals if any issues arise.    Follow Up Instructions: Counselor will send Webex link for next session.  The patient was advised to call back or seek an in-person evaluation if the symptoms worsen or if the condition fails to improve as anticipated.   I provided 180 minutes of non-face-to-face time during this encounter.    Shade Flood, LCSW, LCAS

## 2019-09-19 ENCOUNTER — Other Ambulatory Visit: Payer: Self-pay

## 2019-09-19 ENCOUNTER — Other Ambulatory Visit (HOSPITAL_COMMUNITY): Payer: BC Managed Care – PPO | Admitting: Family

## 2019-09-19 DIAGNOSIS — F331 Major depressive disorder, recurrent, moderate: Secondary | ICD-10-CM

## 2019-09-19 DIAGNOSIS — F410 Panic disorder [episodic paroxysmal anxiety] without agoraphobia: Secondary | ICD-10-CM

## 2019-09-19 DIAGNOSIS — F411 Generalized anxiety disorder: Secondary | ICD-10-CM
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
## 2019-09-19 NOTE — Progress Notes (Unsigned)
Virtual Visit via Video Note  I connected with Kelly [name] on 09/19/19 at  9:00 AM EDT by a video enabled telemedicine application and verified that I am speaking with the correct person using two identifiers.   Case Manager discussed the limitations of evaluation and management by telemedicine and the availability of in person appointments. The patient expressed understanding and agreed to proceed.  Location:  Patient: Patient Home Provider: [place] [name]  History of Present Illness: MDD, GAD, Panic disorder  Observations/Objective: Case Manager checked in with all participants to review discharge dates, insurance authorizations, work-related documents and needs for the treatment team. Clinician facilitated a check-in with group members to assess mood and current functioning. Clinician introduced self and prompted client to provide an update on functioning since last group session. Client introduced herself and identified recent stressors and motivation to engage in IOP. Client discussed recent events that were triggering regarding her adult children that she adopted. Client processed thoughts and feelings related to these events and stated that she is hoping to "be me again" which includes an increase in motivation to engage in activities that she once found pleasurable. Client denied any current SI/HI/psychosis.   Psycho-educational portion of group was centered on the impact language has on thoughts, feelings, behaviors. Clinician introduced video titled 'Happy Brain: How to Overcome Our Neural Predispositions to Suffering'. Clinician prompted members to journal on their responses to the video and then share with the group. Client identified the video as helpful and asked for a link to be sent for further review. Client identified that throughout the guided activity within the video, she thought of her children and grandchildren while sending them gratitude and love. Client reported that  she believes that thinking of others or loved ones when waking up will improve her mood and decrease anxiety. Client validated and related to other members experiences throughout session.  Clinician shared resource handout titled 'Pain to Power Vocabulary" and joined members in reviewing examples in which negative language can be reframed to empower the individual. Clinician praised client's participation and inquired as to any examples on the handout resonated with client. Clinician then proceeded with checkout and assessed for 1 self-care activity prior to tomorrow's group. Client was active in processing handout and relayed that she identified with turning "I can't" into "I won't" and discussed her difficulty in setting boundaries with others which often results in overextending herself to meet others needs. Client relayed that her plan for self-care for the day is have dinner with her husband.   Assessment and Plan: Clinician recommends that patient remains in IOP treatment to better manage mental health symptoms and continue to address treatment plan goals. Clinician recommends adherence to crisis/safety plan, taking medications as prescribed, and following up with medical professionals if any issues arise.  Follow Up Instructions: Clinician will send Webex link for next session. The patient was advised to call back or seek an in-person evaluation if the symptoms worsen or if the condition fails to improve as anticipated.    I provided 180 minutes of non-face-to-face time during this encounter.   Kelly Harder, LCSW

## 2019-09-20 ENCOUNTER — Other Ambulatory Visit: Payer: Self-pay

## 2019-09-20 ENCOUNTER — Encounter (HOSPITAL_COMMUNITY): Payer: Self-pay | Admitting: Family

## 2019-09-20 ENCOUNTER — Other Ambulatory Visit (HOSPITAL_COMMUNITY): Payer: BC Managed Care – PPO | Admitting: Licensed Clinical Social Worker

## 2019-09-20 DIAGNOSIS — G8929 Other chronic pain: Secondary | ICD-10-CM | POA: Diagnosis not present

## 2019-09-20 DIAGNOSIS — R45851 Suicidal ideations: Secondary | ICD-10-CM | POA: Diagnosis not present

## 2019-09-20 DIAGNOSIS — G2581 Restless legs syndrome: Secondary | ICD-10-CM | POA: Diagnosis not present

## 2019-09-20 DIAGNOSIS — F419 Anxiety disorder, unspecified: Secondary | ICD-10-CM | POA: Diagnosis not present

## 2019-09-20 DIAGNOSIS — F411 Generalized anxiety disorder: Secondary | ICD-10-CM

## 2019-09-20 DIAGNOSIS — F410 Panic disorder [episodic paroxysmal anxiety] without agoraphobia: Secondary | ICD-10-CM

## 2019-09-20 DIAGNOSIS — F331 Major depressive disorder, recurrent, moderate: Secondary | ICD-10-CM

## 2019-09-20 DIAGNOSIS — M549 Dorsalgia, unspecified: Secondary | ICD-10-CM | POA: Diagnosis not present

## 2019-09-20 DIAGNOSIS — F329 Major depressive disorder, single episode, unspecified: Secondary | ICD-10-CM | POA: Diagnosis not present

## 2019-09-20 NOTE — Progress Notes (Signed)
Virtual Visit via Video Note   I connected with Kelly [name] on 09/20/19 at 9:00 AM EST by a video enabled telemedicine application and verified that I am speaking with the correct person using two identifiers.   Location: Patient: Patient Home Provider: [facility] Office   Case Manager discussed the limitations of evaluation and management by telemedicine and the availability of in person appointments during orientation. The patient expressed understanding and agreed to proceed.   History of Present Illness: MDD, GAD, Panic disorder    Observations/Objective: Case Manager checked in with all participants to review discharge dates, insurance authorizations, work-related documents and needs for the treatment team. Counselor facilitated a check-in with group members to gauge mood and current functioning as well as identify recent progress towards treatment goals.  Kelly [name] presented on time and was alert, oriented x5, with no evidence or self-report of SI/HI or A/V H.  She reported scores of 7/10 for depression and 9/10 for anxiety today.  Kelly [name] reported that the gloomy weather has her mood down slightly, but she plans to stay active today by babysitting a friend's child, as this gives her "Something to look forward to".  Kelly [name] reported that it has always felt 'Natural to have a baby on the hip' and this tends to increase overall motivation and mood without overstepping personal boundaries.  Kelly [name] reported that she slept much better the night before and plans to take the baby out with her to lunch today to get outside the house.  Kelly [name] also reported that she would try to help her father with some phone calls afterward.     Counselor introduced topic of assertive communication today.  Counselor shared a handout with members virtually in group to read along with on the subject.  This handout defined assertive communication as a communication style in which a person stands up for their own needs  and wants, while also taking into consideration the needs and wants of others, without behaving in a passive or aggressive way.  Traits of assertive communicators were highlighted such as using appropriate speaking volume, maintaining eye contact, using confident language, and avoiding interruption.  Members were also provided with tips on how to improve communication, including respecting oneself, expressing thoughts and feelings calmly, and saying "No" when necessary.  Members were given a variety of scenarios where they could practice using these tips to respond in an assertive manner.  Kelly [name] reported that when she tries to be assertive, it seems that people don't always take her seriously or listen to suggestions.  She reported that this frequently happens in conversations with men in her life and it can be very frustrating, since she feels that her value isn't acknowledged and it makes her feel like a child, even when she turns out to have been right about something.  Intervention was effective, as evidenced by Kelly [name] providing an appropriate assertive response to a fictional request from someone to borrow money.  Kelly [name] reported that this has frequently happened to her before and she will try to help people when feasible, but when someone has developed a pattern of taking advantage and not paying her back, she is not hesitant to say 'No' and will cut them off to put her needs first.        Psycho-educational portion of group was co-facilitated by wellness director (Frederich Balding, MS, MPH, CHES) focused on self-care in daily life. Facilitator and group members discussed presented materials regarding importance of sleep, diet, and exercise. Group members discussed  any changes they are willing to make to improve an area of self-care in their lives (physical, psychological, emotional, spiritual, relationship, professional) to improve overall mental health as they continue with treatment.  Kelly [name] participated  in discussion and noted that she has begun using several electronic applications to assist with self-care, including ones for meditation and lifting mood when feeling depressed.     Counselor ended session by acknowledging a graduating group member by prompting graduating member to reflect on progress made, takeaways from treatment and plan for stepping down. Counselor and group members shared observations of growth, encouragement and support as she transitions out of the program.  Kelly [name] was supportive of group member's completion today and provided encouraging feedback on progress seen.     Assessment and Plan: Counselor recommends that patient remains in IOP treatment to better manage mental health symptoms and continue to address treatment plan goals. Counselor recommends adherence to crisis/safety plan, taking medications as prescribed and following up with medical professionals if any issues arise.    Follow Up Instructions: Counselor will send Webex link for next session.  The patient was advised to call back or seek an in-person evaluation if the symptoms worsen or if the condition fails to improve as anticipated.   I provided 180 minutes of non-face-to-face time during this encounter.     Shade Flood, LCSW, LCAS

## 2019-09-21 ENCOUNTER — Other Ambulatory Visit: Payer: Self-pay

## 2019-09-21 ENCOUNTER — Other Ambulatory Visit (HOSPITAL_COMMUNITY): Payer: BC Managed Care – PPO | Admitting: Licensed Clinical Social Worker

## 2019-09-21 DIAGNOSIS — F411 Generalized anxiety disorder: Secondary | ICD-10-CM

## 2019-09-21 DIAGNOSIS — F331 Major depressive disorder, recurrent, moderate: Secondary | ICD-10-CM

## 2019-09-21 NOTE — Progress Notes (Signed)
Virtual Visit via Video Note  I connected with Kelly [name] on 09/21/19 at  9:00 AM EDT by a video enabled telemedicine application and verified that I am speaking with the correct person using two identifiers.   Case Manager discussed the limitations of evaluation and management by telemedicine and the availability of in person appointments. The patient expressed understanding and agreed to proceed.  Location:  Patient: Patient Home Provider: [place] [facility]  History of Present Illness: MDD, GAD, and Panic disorder  Observations/Objective: Case Manager checked in with all participants to review discharge dates, insurance authorizations, work-related documents and needs for the treatment team. Clinician facilitated a check-in with group members to assess mood and current functioning. Clinician introduced self and prompted client to provide an update on functioning since last group session. Client checked in and reported experiencing a difficult night due to various medical issues which made it difficult to sleep. Client agreed that sleep impacts her mental health greatly. Client reported that yesterday she was able to appropriately set a boundary with a family member which was an identified goal from a previous group session. Client was receptive to praise from the group and engaged in discussion on the perception others have once we begin setting boundaries. Client denied any current SI/HI/psychosis.   Psycho-educational portion of group was centered on personal strengths and how they impact mood, motivation, and overall functioning. Clinician shared brief educational video titled 'Identifying Your Strengths' and utilized open ended questions to encourage members to share their thoughts and responses to video. Clinician assessed members thoughts on their own personal strengths and their ability to recognize these strengths in themselves. Client did not share thoughts on the video however  supported other members throughout session. Client provided support to a new member in today's session by relating to experiences and providing helpful and relevant feedback.  Clinician shared resource handouts titled 'Strengths Exploration' and 'Strengths by [employer]' and joined members in reviewing examples of personal strengths. Clinician praised client's participation and inquired as to any examples on the handout resonated with client. Clinician then proceeded with checkout and assessed for 1 self-care activity prior to tomorrow's group. Client passively participated throughout exercise due to reported technical difficulties with internet connection however confirmed that she has downloaded the handouts and was actively reviewing them throughout session. Client relayed that her plan for self-care for the day is to take care of various animals at her house.  Assessment and Plan: Clinician recommends that patient remains in IOP treatment to better manage mental health symptoms and continue to address treatment plan goals. Clinician recommends adherence to crisis/safety plan, taking medications as prescribed, and following up with medical professionals if any issues arise.  Follow Up Instructions: Clinician will send Webex link for next session. The patient was advised to call back or seek an in-person evaluation if the symptoms worsen or if the condition fails to improve as anticipated.    I provided 180 minutes of non-face-to-face time during this encounter.   Renee Harder, LCSW

## 2019-09-22 ENCOUNTER — Other Ambulatory Visit (HOSPITAL_COMMUNITY): Payer: BC Managed Care – PPO | Admitting: Licensed Clinical Social Worker

## 2019-09-22 ENCOUNTER — Other Ambulatory Visit: Payer: Self-pay

## 2019-09-22 DIAGNOSIS — F331 Major depressive disorder, recurrent, moderate: Secondary | ICD-10-CM

## 2019-09-22 DIAGNOSIS — F411 Generalized anxiety disorder: Secondary | ICD-10-CM | POA: Diagnosis not present

## 2019-09-22 DIAGNOSIS — G8929 Other chronic pain: Secondary | ICD-10-CM | POA: Diagnosis not present

## 2019-09-22 DIAGNOSIS — F410 Panic disorder [episodic paroxysmal anxiety] without agoraphobia: Secondary | ICD-10-CM

## 2019-09-22 DIAGNOSIS — M549 Dorsalgia, unspecified: Secondary | ICD-10-CM | POA: Diagnosis not present

## 2019-09-22 DIAGNOSIS — F419 Anxiety disorder, unspecified: Secondary | ICD-10-CM | POA: Diagnosis not present

## 2019-09-22 DIAGNOSIS — F329 Major depressive disorder, single episode, unspecified: Secondary | ICD-10-CM | POA: Diagnosis not present

## 2019-09-22 DIAGNOSIS — G2581 Restless legs syndrome: Secondary | ICD-10-CM | POA: Diagnosis not present

## 2019-09-22 DIAGNOSIS — R45851 Suicidal ideations: Secondary | ICD-10-CM | POA: Diagnosis not present

## 2019-09-22 NOTE — Progress Notes (Signed)
Virtual Visit via Video Note   I connected with Kelly [name] on 09/22/19 at 9:00 AM EST by a video enabled telemedicine application and verified that I am speaking with the correct person using two identifiers.   Location: Patient: Patient Home Provider: [place] [facility]   Case Manager discussed the limitations of evaluation and management by telemedicine and the availability of in person appointments during orientation. The patient expressed understanding and agreed to proceed.   History of Present Illness: MDD, GAD, Panic disorder    Observations/Objective: Case Manager checked in with all participants to review discharge dates, insurance authorizations, work-related documents and needs for the treatment team. Counselor facilitated a check-in with group members to gauge mood and current functioning as well as identify recent progress towards treatment goals.  Kelly [name] presented to session on time and was alert, oriented x5, with no evidence or self-report of SI/HI or A/V H.  She reported scores of 6/10 for depression and 6/10 for anxiety today.  Kelly [name] reported that she is feeling okay today, but was stressed the day before when she learned that some tornados touched down near her home.  Kelly [name] reported that when she learned of this, she was in a restaurant and tried to distract herself to avoid becoming to anxious by offering to help others in need.  She reported that she would plan to spend time with her husband this weekend, help her son with custody concerns, and continue trying to practice healthy sleep hygiene techniques to improve her rest.    Counselor introduced topic of anger management today.  Counselor shared a handout with members on this subject featuring a variety of coping skills, and facilitated discussion on these approaches.  Examples included raising awareness of anger triggers, practicing deep breathing, keeping an anger log to better understand episodes, using diversion  activities to distract oneself for 30 minutes, taking a time out when necessary, and being mindful of warning signs tied to thoughts or behavior.  Counselor inquired about which techniques group members have used before, what has proved to be helpful, what their unique warning signs might be, as well as what they will try out in the future to assist with de-escalation.  Kelly [name] reported that she was raised to believe that children should be 'seen and not heard', so expressing anger was not considered to be acceptable behavior.  Kelly [name] reported that this has led her to repress feelings over time, and it takes a lot to push her to the breaking point, but several members of her family are particularly good at triggering her, especially if someone brings up unpleasant things from the past, or when people jump to conclusions or make assumptions.  She reported that one sign which could indicate that she is nearing her breaking point it is when she begins to cry and raise her voice.  Kelly [name] also reported that she can begin to feel her blood pressure rise, feel the heat on her skin, and stated "I know at that point I need to breathe and think more before I respond, or I'm likely to say or do something I'll regret later".  Kelly [name] also reported that she would try to distract herself from feelings of anger when necessary by tending to the animals in her home, or talking to her husband for support.     Assessment and Plan: Counselor recommends that patient remains in IOP treatment to better manage mental health symptoms and continue to address treatment plan goals. Counselor recommends adherence to crisis/safety  plan, taking medications as prescribed and following up with medical professionals if any issues arise.    Follow Up Instructions: Counselor will send Webex link for next session.  The patient was advised to call back or seek an in-person evaluation if the symptoms worsen or if the condition fails to improve  as anticipated.   I provided 180 minutes of non-face-to-face time during this encounter.     Shade Flood, LCSW, LCAS

## 2019-09-25 ENCOUNTER — Other Ambulatory Visit (HOSPITAL_COMMUNITY): Payer: BC Managed Care – PPO

## 2019-09-25 ENCOUNTER — Other Ambulatory Visit: Payer: Self-pay

## 2019-09-26 ENCOUNTER — Other Ambulatory Visit (HOSPITAL_COMMUNITY): Payer: BC Managed Care – PPO | Admitting: Licensed Clinical Social Worker

## 2019-09-26 ENCOUNTER — Other Ambulatory Visit: Payer: Self-pay

## 2019-09-26 DIAGNOSIS — F331 Major depressive disorder, recurrent, moderate: Secondary | ICD-10-CM

## 2019-09-26 DIAGNOSIS — F410 Panic disorder [episodic paroxysmal anxiety] without agoraphobia: Secondary | ICD-10-CM

## 2019-09-26 DIAGNOSIS — F411 Generalized anxiety disorder: Secondary | ICD-10-CM

## 2019-09-26 NOTE — Progress Notes (Signed)
Virtual Visit via Video Note  I connected with [name] on 09/26/19 at  9:00 AM EDT by a video enabled telemedicine application and verified that I am speaking with the correct person using two identifiers.   Case Manager discussed the limitations of evaluation and management by telemedicine and the availability of in person appointments. The patient expressed understanding and agreed to proceed.  Location:  Patient: Patient Home Provider: [place] [facility]  HPI: MDD, GAD, Panic disorder   Observations/Objective: Case Manager checked in with all participants to review discharge dates, insurance authorizations, work-related documents and needs for the treatment team. Clinician facilitated a check-in with group members to assess mood and current functioning. Clinician introduced self and prompted client to provide an update on functioning since last group meeting. Client was active throughout check in and provided an update reporting that she has been busy since late last week and was unable to care for her animals due to helping others. Client reported that she is continuing to be mindful of boundaries and is striving towards being able to take care of herself. Client provided support to a new member and was active in relating her own experiences to what was shared. Client denied any current SI/HI/psychosis.   Psycho-educational portion of group centered on identifying and relaying needs to others in order to feel supported. Clinician introduced topic and assessed member's ability to relay their needs to others and any potential barriers in doing so. Clinician prompted discussion on the value in relaying needs to others to improve relationships and mood. Clinician shared video titled 'What is Your Love Language' and encouraged members to share their responses. Clinician validated client's feelings and encouraged them to consider their own love language that can be applied in various  relationships in their life. Client reported that she typically struggles to relay needs to others and identified a barrier in feeling shamed, judged, or ridiculed by others should she reach out for help. Client shared feedback with other members and related to their identified experiences.  Clinician shared a Love Language quiz both virtually and a handout to group members. Clinician encouraged members to take quiz and then share responses. Clinician utilized open ended questions and CBT to challenge member's thoughts regarding their responses as well as how they can inform others on how to best support them. Clinician assessed for 1 self-care activity prior to tomorrow's group. Client took the quiz and reported her top love language as words of quality time. Client discussed experiences in which she has felt that someone was truly present with her in the moment and processed feelings related to that thought. Client reported that she will catch up on sleep in order to take care of herself this afternoon.  Assessment and Plan: Clinician recommends that patient remains in IOP treatment to better manage mental health symptoms and continue to address treatment plan goals. Clinician recommends adherence to crisis/safety plan, taking medications as prescribed, and following up with medical professionals if any issues arise.  Follow Up Instructions: Clinician will send Webex link for next session. The patient was advised to call back or seek an in-person evaluation if the symptoms worsen or if the condition fails to improve as anticipated.   I provided 180 minutes of non-face-to-face time during this encounter.   Renee Harder, LCSW

## 2019-09-27 ENCOUNTER — Other Ambulatory Visit (HOSPITAL_COMMUNITY): Payer: BC Managed Care – PPO | Admitting: Licensed Clinical Social Worker

## 2019-09-27 ENCOUNTER — Other Ambulatory Visit: Payer: Self-pay

## 2019-09-27 DIAGNOSIS — G2581 Restless legs syndrome: Secondary | ICD-10-CM | POA: Diagnosis not present

## 2019-09-27 DIAGNOSIS — R45851 Suicidal ideations: Secondary | ICD-10-CM | POA: Diagnosis not present

## 2019-09-27 DIAGNOSIS — F329 Major depressive disorder, single episode, unspecified: Secondary | ICD-10-CM | POA: Diagnosis not present

## 2019-09-27 DIAGNOSIS — F410 Panic disorder [episodic paroxysmal anxiety] without agoraphobia: Secondary | ICD-10-CM

## 2019-09-27 DIAGNOSIS — F419 Anxiety disorder, unspecified: Secondary | ICD-10-CM | POA: Diagnosis not present

## 2019-09-27 DIAGNOSIS — G8929 Other chronic pain: Secondary | ICD-10-CM | POA: Diagnosis not present

## 2019-09-27 DIAGNOSIS — F411 Generalized anxiety disorder: Secondary | ICD-10-CM

## 2019-09-27 DIAGNOSIS — F331 Major depressive disorder, recurrent, moderate: Secondary | ICD-10-CM

## 2019-09-27 DIAGNOSIS — M549 Dorsalgia, unspecified: Secondary | ICD-10-CM | POA: Diagnosis not present

## 2019-09-27 NOTE — Progress Notes (Signed)
Virtual Visit via Video Note   I connected with Kelly [name] on 09/27/19 at 9:00 AM EST by a video enabled telemedicine application and verified that I am speaking with the correct person using two identifiers.   Location: Patient: Patient Home Provider: [place] [facility]   Case Manager discussed the limitations of evaluation and management by telemedicine and the availability of in person appointments during orientation. The patient expressed understanding and agreed to proceed.   History of Present Illness: MDD, GAD, Panic disorder    Observations/Objective: Case Manager checked in with all participants to review discharge dates, insurance authorizations, work-related documents and needs for the treatment team. Counselor facilitated a check-in with group members to gauge mood and current functioning as well as identify recent progress towards treatment goals.  Kelly [name] presented to session on time and was alert, oriented x5, with no evidence or self-report of SI/HI or A/V H.  She reported scores of 6/10 for depression and 9/10 for anxiety today.  Kelly [name] reported that she is feeling alright today and had a productive day yesterday babysitting and signing some paperwork for DSS regarding her adopted child.  Kelly [name] reported that she intends to 'take it easy' today and relax since she has been so busy.  Kelly [name] provided supportive feedback to another client dealing with grief in the family at this time.    Psycho-educational portion of group was provided by pharmacist, Einar Grad. Pharmacist provided psychoeducation on classes of medications such as antidepressants, antipsychotics, what symptoms they are intended to treat, and any side effects one might encounter while on prescription.  Time was allowed for clients to ask any questions they might have of pharmacist.  Kelly [name] was observed listening to discussion, but did not actively participate and denied having any questions or concerns regarding  medication at this time.    Counselor introduced topic of mental illness stigma today.  Counselor explained how stigma is defined as someone viewing another person in a negative way due to distinguishing characteristics or traits which are thought to be a disadvantage, including negative beliefs or attitudes associated with people who have a mental health condition such as major depression and/or generalized anxiety.  Counselor explained that stigma can lead to discrimination as well, and the harmful effects of pervasive stigma include reluctance to seek help or treatment, lack of understanding by family, friends, co-workers or peers, bullying/harassment, or negative impact on one's perceived ability to overcome challenges or succeed in life.  Counselor offered several suggestions for coping with stigma regarding mental illness, including staying engaged in treatment through linkage with a therapist, psychiatrist, and/or support group, avoiding isolation, and speaking out against stigma when encountered to reverse societal impact and increase acceptance and understanding.  Counselor inquired about members' experiences with this issue, as well as what they have done to minimize the impact it can have on their lives.  Interventions were effective, as evidenced by Kelly [name] opening up about how she has seen some members of her own family struggle with mental health stigma in silence, while she herself has tried to be more open and willing to ask for help since her 27's when issues could no longer be ignored and she finally sought help.  Kelly [name] reported that she was witness to the negative impact that repression of emotions and problems can have on mental health when she was young, and now tries to [name] as a role model to her children and encourage them to seek help through therapy as well when things  become too difficult to manage alone.  Kelly [name] stated "I've told my kids not to wait, and that its okay to be on  meds".     Assessment and Plan: Counselor recommends that patient remains in IOP treatment to better manage mental health symptoms and continue to address treatment plan goals. Counselor recommends adherence to crisis/safety plan, taking medications as prescribed and following up with medical professionals if any issues arise.    Follow Up Instructions: Counselor will send Webex link for next session.  The patient was advised to call back or seek an in-person evaluation if the symptoms worsen or if the condition fails to improve as anticipated.   I provided 180 minutes of non-face-to-face time during this encounter.     Shade Flood, LCSW, LCAS

## 2019-09-28 ENCOUNTER — Other Ambulatory Visit (HOSPITAL_COMMUNITY): Payer: BC Managed Care – PPO | Admitting: Licensed Clinical Social Worker

## 2019-09-28 ENCOUNTER — Other Ambulatory Visit: Payer: Self-pay

## 2019-09-28 DIAGNOSIS — F331 Major depressive disorder, recurrent, moderate: Secondary | ICD-10-CM

## 2019-09-28 DIAGNOSIS — F411 Generalized anxiety disorder: Secondary | ICD-10-CM

## 2019-09-28 DIAGNOSIS — F410 Panic disorder [episodic paroxysmal anxiety] without agoraphobia: Secondary | ICD-10-CM

## 2019-09-29 ENCOUNTER — Other Ambulatory Visit: Payer: Self-pay

## 2019-09-29 ENCOUNTER — Other Ambulatory Visit (HOSPITAL_COMMUNITY): Payer: BC Managed Care – PPO | Admitting: Licensed Clinical Social Worker

## 2019-09-29 DIAGNOSIS — G2581 Restless legs syndrome: Secondary | ICD-10-CM | POA: Diagnosis not present

## 2019-09-29 DIAGNOSIS — R45851 Suicidal ideations: Secondary | ICD-10-CM | POA: Diagnosis not present

## 2019-09-29 DIAGNOSIS — M549 Dorsalgia, unspecified: Secondary | ICD-10-CM | POA: Diagnosis not present

## 2019-09-29 DIAGNOSIS — F329 Major depressive disorder, single episode, unspecified: Secondary | ICD-10-CM | POA: Diagnosis not present

## 2019-09-29 DIAGNOSIS — F411 Generalized anxiety disorder: Secondary | ICD-10-CM | POA: Diagnosis not present

## 2019-09-29 DIAGNOSIS — F419 Anxiety disorder, unspecified: Secondary | ICD-10-CM | POA: Diagnosis not present

## 2019-09-29 DIAGNOSIS — G8929 Other chronic pain: Secondary | ICD-10-CM | POA: Diagnosis not present

## 2019-09-29 DIAGNOSIS — F331 Major depressive disorder, recurrent, moderate: Secondary | ICD-10-CM

## 2019-09-29 DIAGNOSIS — F410 Panic disorder [episodic paroxysmal anxiety] without agoraphobia: Secondary | ICD-10-CM

## 2019-09-29 NOTE — Progress Notes (Signed)
Virtual Visit via Video Note   I connected with Kelly [name] on 09/29/19 at 9:00 AM EST by a video enabled telemedicine application and verified that I am speaking with the correct person using two identifiers.   Location: Patient: Patient Home Provider: [place] [facility]   Case Manager discussed the limitations of evaluation and management by telemedicine and the availability of in person appointments during orientation. The patient expressed understanding and agreed to proceed.   History of Present Illness: MDD, GAD, Panic disorder    Observations/Objective: Case Manager checked in with all participants to review discharge dates, insurance authorizations, work-related documents and needs for the treatment team. Counselor facilitated a check-in with group members to gauge mood and current functioning as well as identify recent progress towards treatment goals.  Kelly [name] presented to group session on time and was alert, oriented x5, with no evidence or self-report of SI/HI or A/V H.  She reported scores of 5/10 for depression and 7/10 for anxiety this morning.  Kelly [name] reported that she was feeling tired today, as she stayed up late the night before with heartburn.  She reported that she plans to spend the weekend getting some rest to avoid burnout.  Kelly [name] reported that one success the day before was going out for a nice seafood dinner with her husband.  She reported that she intends to speak with her PCP soon about changing medication, as she feels like this may be influencing some of her fatigue.    Counselor introduced topic of self-care today.  Counselor explained how this can be defined as the things one does to maintain good health and improve well-being.  Counselor provided members with a self-care assessment form to complete.  This handout featured various sub-categories of self-care, including physical, psychological/emotional, social, spiritual, and professional.  Members were asked to  rank their engagement in the activities listed for each dimension on a scale of 1-3, with 1 indicating 'Poor', 2 indicating '[facility]', and 3 indicating 'Well'.  Counselor invited members to share results of their assessment, and inquired about which areas of self-care they are doing well in, as well as areas that require attention.  Kelly [name] participated in activity and reported that as mentioned before, she has been neglecting her normal sleep routine, and intends to continue exploring sleep hygiene techniques with assistance from MD to address this.  Kelly [name] also reported that she has succeeded in improving her support network, as she identified several 'toxic people' she needs to maintain distance from to improve her mood.  She reported that she is trying to spend more time around "Happy helpful people who will get me involved in healthier hobbies".  She reported that she intends to have a 'campout night' with some of these supports soon, where they can have a fire in her backyard, cook food, and socialize.  Kelly [name] reported that she is also trying to limit exposure to certain social media, as she realized that this can have a similar effect upon her mood when the news being presented isn't positive or uplifting.     Assessment and Plan: Counselor recommends that patient remains in IOP treatment to better manage mental health symptoms and continue to address treatment plan goals. Counselor recommends adherence to crisis/safety plan, taking medications as prescribed and following up with medical professionals if any issues arise.    Follow Up Instructions: Counselor will send Webex link for next session.  The patient was advised to call back or seek an in-person evaluation if the symptoms  worsen or if the condition fails to improve as anticipated.   I provided 180 minutes of non-face-to-face time during this encounter.     Shade Flood, LCSW, LCAS

## 2019-10-02 ENCOUNTER — Other Ambulatory Visit: Payer: Self-pay | Admitting: Family Medicine

## 2019-10-02 DIAGNOSIS — G2581 Restless legs syndrome: Secondary | ICD-10-CM

## 2019-10-02 DIAGNOSIS — G43009 Migraine without aura, not intractable, without status migrainosus: Secondary | ICD-10-CM

## 2019-10-02 NOTE — Progress Notes (Signed)
Virtual Visit via Video Note  I connected with Kelly [name] on 10/02/19 at  9:00 AM EDT by a video enabled telemedicine application and verified that I am speaking with the correct person using two identifiers.   Case Manager discussed the limitations of evaluation and management by telemedicine and the availability of in person appointments. The patient expressed understanding and agreed to proceed.  Location:  Patient: Patient Home Provider: [place] [facility]  History of Present Illness: MDD, GAD, Panic d/o  Observations/Objective: Case Manager checked in with all participants to review discharge dates, insurance authorizations, work-related documents and needs for the treatment team. Clinician facilitated a check-in with group members to assess mood and current functioning. Clinician introduced self and prompted client to provide an update on functioning since last group session. Client checked in and reported "I'm pretty good". Client discussed being busy over the last few days and feeling drained of energy. Client agreed that she continues to put others first in helping with their problems and needs and sees a need for change in implementing boundaries so that she doesn't overextend herself. Client denied any current SI/HI/psychosis.   Psycho-educational portion of group was provided by Orbie Pyo who facilitated discussion on various types of grief and their impact on one's functioning. Chaplain Estill Bamberg utilized an analogy of pain triggers and how they typically grow smaller over time which leads to less emotional response however validated that grief will still remain. Client actively engaged in processing the death of her uncle. Client reported that even though he passed away over 20 years ago, she often feels reminded of him specifically when at the beach with family. Client reported that at times, she will question "is he sending me a sign or something".  Clinician facilitated group  processing throughout the remainder of group which centered around stigma associated with mental health symptoms and disorders. Clinician utilized CBT to challenge member's thoughts as well as validating member's feelings and statements. Clinician prompted clients to discuss the "mask" that they wear in presenting to society vs who they are internally. Clinician assessed for 1 self-care activity that members will engage in today. Client actively provided support to fellow members and described the group as "supportive". Client reported that she "tells everyone I'm fine" because she feels as though they won't understand her struggles. Client was receptive to feedback on the importance of expressing feelings and needs rather than bottling them inside. Client reported that she has no self-care activity planned however "whatever comes up" she will do as well as try to rest.  Assessment and Plan: Clinician recommends that patient remains in IOP treatment to better manage mental health symptoms and continue to address treatment plan goals. Clinician recommends adherence to crisis/safety plan, taking medications as prescribed, and following up with medical professionals if any issues arise.  Follow Up Instructions: Clinician will send Webex link for next session. The patient was advised to call back or seek an in-person evaluation if the symptoms worsen or if the condition fails to improve as anticipated.  I provided 180 minutes of non-face-to-face time during this encounter.   Renee Harder, LCSW

## 2019-10-03 ENCOUNTER — Other Ambulatory Visit: Payer: Self-pay

## 2019-10-03 ENCOUNTER — Encounter (HOSPITAL_COMMUNITY): Payer: Self-pay | Admitting: Family

## 2019-10-03 ENCOUNTER — Other Ambulatory Visit (HOSPITAL_COMMUNITY): Payer: BC Managed Care – PPO | Admitting: Licensed Clinical Social Worker

## 2019-10-03 ENCOUNTER — Other Ambulatory Visit: Payer: Self-pay | Admitting: Family Medicine

## 2019-10-03 DIAGNOSIS — F331 Major depressive disorder, recurrent, moderate: Secondary | ICD-10-CM

## 2019-10-03 DIAGNOSIS — G43009 Migraine without aura, not intractable, without status migrainosus: Secondary | ICD-10-CM

## 2019-10-03 DIAGNOSIS — F411 Generalized anxiety disorder: Secondary | ICD-10-CM

## 2019-10-03 MED ORDER — TOPIRAMATE 100 MG PO TABS: 100.0000 mg | ORAL_TABLET | Freq: Every evening | ORAL | 1 refills | Status: DC

## 2019-10-03 NOTE — Progress Notes (Signed)
Virtual Visit via Video Note  I connected with Kelly [name] on @TODAY @ at  9:00 AM EDT by a video enabled telemedicine application and verified that I am speaking with the correct person using two identifiers.   I discussed the limitations of evaluation and management by telemedicine and the availability of in person appointments. The patient expressed understanding and agreed to proceed.  I discussed the assessment and treatment plan with the patient. The patient was provided an opportunity to ask questions and all were answered. The patient agreed with the plan and demonstrated an understanding of the instructions.   The patient was advised to call back or seek an in-person evaluation if the symptoms worsen or if the condition fails to improve as anticipated.  I provided 30 minutes of non-face-to-face time during this encounter.   Patient ID: Kelly [name], female   DOB: 08/09/1965, 54 y.o.   MRN: [id number] As previous CCA states: This is a 54 yr old, married, unemployed Caucasian female who was referred per Dr. Shea Evans; treatment for worsening depressive and anxiety symptoms.  Admits to passive SI (denies a plan or intent).  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  "We came across two young girls (ages 14 and 62) who we adopted."  Pt reports they started having a lot of behavioral issues with the girls(ie. they were contacting their biological parents, destroying the home, stealing money, physically abusive towards pt).  According to pt, they had the youngest until age 23 and the 55 yr old moved out.  "I think the 55 yr old is back in town and not far from our house."  Pt states she recently had a court hearing for child support of her adopted daughter who isn't under her custody.  Apparently, it didn't go in her favor.  "We have to prove that the 55 yr old has graduated from high school  or that she moved out of state."  Pt reports after court, she became very dizzy and felt faint.  2) Husband has been losing a lot of weight and has been going to various doctors.  He is unable to work; so they are having financial issues.  Pt denies any hx of psychiatric admissions.  Has been seeing Dr. Shea Evans and Marjie Skiff (therapist).  Denies any past suicide attempts or gestures.  Family hx:  Various Paternal Aunts/Uncles (depression and suicided).  Pt attended all scheduled days in [place].  Reports feeling a little more hopeful.  "I am sleeping better, feeling a little more like myself now."  Pt also mentioned that she's a little more active now.  Pt is even exploring part-time job opportunities.  Reports going through a stressful time with oldest son.  "He was kicked out of the home with his girlfriend and his estranged wife and her are plotting against him."  Pt states she went to court with him yesterday and it didn't go very well.  "That's what I do, I'm fine as long as I am taking care of everyone else." Pt denies SI/HI or A/V hallucinations.  A:  D/C today.  F/U with Dr. Shea Evans 10-10-19? And Marjie Skiff, [facility] next month.  Encouraged support groups.  R:  Pt receptive.  Dellia Nims, M.Ed,CNA

## 2019-10-03 NOTE — Patient Instructions (Signed)
D:  Patient successfully completed MH-IOP today.  A:  Discharge today.  Follow up with Dr. Shea Evans on 10-10-19? And Marjie Skiff, [facility] next month also.  Encouraged support groups.  R:  Patient receptive.

## 2019-10-03 NOTE — Progress Notes (Signed)
Virtual Visit via Video Note  I connected with Kelly [name] on 10/03/19 at  9:00 AM EDT by a video enabled telemedicine application and verified that I am speaking with the correct person using two identifiers.   Case Manager discussed the limitations of evaluation and management by telemedicine and the availability of in person appointments. The patient expressed understanding and agreed to proceed.  Location:  Patient: Patient Home Provider: [place] Office  History of Present Illness: MDD, GAD, Panic disorder  Observations/Objective: Case Manager checked in with all participants to review discharge dates, insurance authorizations, work-related documents and needs for the treatment team. Clinician facilitated a check-in with group members to assess mood and current functioning. Clinician introduced self and prompted client to provide an update on functioning since last group session. Client checked in and reported recent changes within her household such as her son living with her again. Client reported "I get to be mom again" and discussed the joy she feels in having family around. Client described herself as "a mama bear" and processed recent events in which she felt the need to protect her children. Client reports she has continued to "feel better and sleep better". Client denied any current SI/HI/psychosis.   Psycho-educational portion of group centered on cognitive distortions and unhelpful thinking patterns. Clinician shared psychoeducational video titled 'Cognitive Distortions' as well as handout providing definition and examples of various cognitive distortion thinking styles. Clinician prompted members to identify specific distortions they feel they engage in and how this impacts feelings/mood.  Clinician utilized open ended questions to prompt discussion when needed and appropriate re-direction. Clinician praised client's insight and willingness to engage. Client reported that she  finds herself engaging in personalization and relayed often feeling like "everything is my fault". Client reported magical thinking in believing that she can control outcomes and certain behaviors on her behalf impact scenarios that she logically knows she can't control.   Clinician shared handout titled 'Challenging Negative Thoughts' which provides examples of questions that can be utilized to assess for distorted thinking to challenge the thought and improve feelings and behaviors. Clinician checked in with each member individually to process their response and any questions that they feel may be helpful for them to improve thinking patterns. Time was spent at the end of group for members to celebrate a graduating member and provide well wishes and feedback. Client watched the video presentation and was open to sharing her thoughts on the information. Client reported that would find it helpful to consider what a friend would think of a situation to challenge distorted thoughts. Client provided closing thoughts on her engagement within this program. Client reported "I learned to say no, how to prioritize, and that I'm not alone". Client provided her email to peers so that they can stay in contact and act as a support network to one another. Client thanked others for their kind words and support throughout group sessions.  Assessment and Plan: Counselor recommends that patient remains engaged in individual outpatient therapy following successful completion of IOP to continue management of mental health symptoms and progress towards treatment plan goals. Counselor recommends adherence to crisis/safety plan, taking medications as prescribed, and following up with medical professionals if any issues arise.  Follow Up Instructions: The patient was advised to call back or seek an in-person evaluation if the symptoms worsen or if the condition fails to improve as anticipated.  I provided 180 minutes of  non-face-to-face time during this encounter.   Renee Harder,  LCSW

## 2019-10-03 NOTE — Telephone Encounter (Signed)
She is taking a total of 100 mg nightly. She take to 50 mg tablets. The migraines are not as bad as they were. Headache pain does not always come from the front. At times it comes from her neck and radiates to the back of her head to the front. She has been evaluated ortho Dr. Zollie Scale, but unable to do MRI because insurance want cover. She would prefer to have 90 day supply of medication.
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
## 2019-10-10 ENCOUNTER — Encounter: Payer: Self-pay | Admitting: Psychiatry

## 2019-10-10 ENCOUNTER — Other Ambulatory Visit: Payer: Self-pay

## 2019-10-10 ENCOUNTER — Ambulatory Visit (INDEPENDENT_AMBULATORY_CARE_PROVIDER_SITE_OTHER): Payer: BC Managed Care – PPO | Admitting: Psychiatry

## 2019-10-10 DIAGNOSIS — F410 Panic disorder [episodic paroxysmal anxiety] without agoraphobia: Secondary | ICD-10-CM | POA: Diagnosis not present

## 2019-10-10 DIAGNOSIS — F411 Generalized anxiety disorder: Secondary | ICD-10-CM

## 2019-10-10 DIAGNOSIS — F5105 Insomnia due to other mental disorder: Secondary | ICD-10-CM | POA: Diagnosis not present

## 2019-10-10 DIAGNOSIS — F331 Major depressive disorder, recurrent, moderate: Secondary | ICD-10-CM | POA: Diagnosis not present

## 2019-10-10 MED ORDER — MIRTAZAPINE 7.5 MG PO TABS: 7.5000 mg | ORAL_TABLET | Freq: Every day | ORAL | 1 refills | Status: DC

## 2019-10-10 NOTE — Progress Notes (Signed)
Provider Location : [facility] Patient Location : [facility] Visit via Video Note  I connected with Kelly [name] on 10/10/19 at  1:00 PM EDT by a video enabled telemedicine application and verified that I am speaking with the correct person using two identifiers.   I discussed the limitations of evaluation and management by telemedicine and the availability of in person appointments. The patient expressed understanding and agreed to proceed.   I discussed the assessment and treatment plan with the patient. The patient was provided an opportunity to ask questions and all were answered. The patient agreed with the plan and demonstrated an understanding of the instructions.   The patient was advised to call back or seek an in-person evaluation if the symptoms worsen or if the condition fails to improve as anticipated.  [place] MD OP Progress Note  10/10/2019 2:20 PM Kelly [name]  MRN:  [id number]  Chief Complaint:  Chief Complaint    Follow-up     HPI: Kelly [name] is a 54 year old Caucasian female who is married, lives in [place], has a history of depression, anxiety disorder, panic disorder, insomnia, gastroesophageal reflux disease, lumbar disc problem, migraine headaches was evaluated by telemedicine today.  Patient today reports she continues to have several psychosocial stressors.  She continues to be worried about the legal issues with child support battle that is going on.  She has filed a motion in court regarding the same.  She reports her son who was going through a separation moved in with her.  She reports she is also worried about him however they have been supporting each other.  She reports the venlafaxine was making her tired during the day.  She hence started taking it in the evening.  She reports she struggles with sleep however does not know if the venlafaxine is contributing to it since she had it even before switching the timing.  She sleeps only around 4 hours at  night.  She continues to have anxiety and panic symptoms when she is alone in social situations.  She  has to take a pet or another person with her in social situations.  She recently completed intensive outpatient program and reports it went well.  Patient denies any suicidality, homicidality or perceptual disturbances.  She does report tinnitus which started few days ago however reports it could be due to her seasonal allergies.  She also has dizziness and sinus pressure.  She will follow-up with her providers for the same.  She continues to follow-up with her therapist Ms. Marjie Skiff.   Visit Diagnosis:    ICD-10-CM   1. MDD (major depressive disorder), recurrent episode, moderate (HCC)  F33.1   2. GAD (generalized anxiety disorder)  F41.1 mirtazapine (REMERON) 7.5 MG tablet  3. Panic disorder  F41.0 mirtazapine (REMERON) 7.5 MG tablet  4. Insomnia due to mental condition  F51.05 mirtazapine (REMERON) 7.5 MG tablet    Past Psychiatric History: I have reviewed past psychiatric history from my progress note on 10/25/2018.  Past trials of medications like Paxil, trazodone, hydroxyzine.Patient completed IOP - 10/03/2019.  Past Medical History:  Past Medical History:  Diagnosis Date  . Allergy   . Anxiety   . Depression   . GERD (gastroesophageal reflux disease)   . History of kidney stones   . Insomnia   . Intermittent low back pain   . Migraines   . Osteoarthritis   . Recurrent UTI   . Restless leg syndrome   . Sciatica of right  side   . Symptomatic menopausal or female climacteric states   . Vertigo   . Vitamin D deficiency     Past Surgical History:  Procedure Laterality Date  . BACK SURGERY    . COLONOSCOPY WITH PROPOFOL N/A 02/03/2019   Procedure: COLONOSCOPY WITH PROPOFOL;  Surgeon: Jonathon Bellows, MD;  Location: [facility] ENDOSCOPY;  Service: Gastroenterology;  Laterality: N/A;  . COLONOSCOPY WITH PROPOFOL N/A 03/10/2019   Procedure: COLONOSCOPY WITH PROPOFOL;  Surgeon:  Jonathon Bellows, MD;  Location: [facility] ENDOSCOPY;  Service: Gastroenterology;  Laterality: N/A;  . DIAGNOSTIC LAPAROSCOPY    . DILATION AND CURETTAGE OF UTERUS    . KNEE ARTHROSCOPY WITH MEDIAL MENISECTOMY Left 01/14/2017   Procedure: KNEE ARTHROSCOPY WITH MEDIAL AND LATERAL MENISECTOMY;  Surgeon: Hessie Knows, MD;  Location: [facility] ORS;  Service: Orthopedics;  Laterality: Left;  Partial Knee menisectomy  . KNEE SURGERY Left 05/2011   Dr. Rudene Christians- Arthroscopic  . LUMBAR LAMINECTOMY/DECOMPRESSION MICRODISCECTOMY N/A 09/16/2016   Procedure: LUMBAR LAMINECTOMY/DECOMPRESSION MICRODISCECTOMY 1 LEVEL L5-S1;  Surgeon: Meade Maw, MD;  Location: [facility] ORS;  Service: Neurosurgery;  Laterality: N/A;  . TONSILLECTOMY AND ADENOIDECTOMY    . URETHRAL STRICTURE DILATATION      Family Psychiatric History: I have reviewed family psychiatric history from my progress note on 10/25/2018.  Family History:  Family History  Problem Relation Age of Onset  . Cancer Paternal Grandmother   . Anxiety disorder Cousin   . Depression Cousin     Social History: I have reviewed social history from my progress note on 10/25/2018. Social History   Socioeconomic History  . Marital status: Married    Spouse name: Jenny Reichmann  . Number of children: 2  . Years of education: 75  . Highest education level: Associate degree: occupational, Hotel manager, or vocational program  Occupational History  . Occupation: not employed  Tobacco Use  . Smoking status: Former Smoker    Packs/day: 0.75    Years: 2.00    Pack years: 1.50    Types: Cigarettes    Start date: 07/06/1981    Quit date: 07/07/1983    Years since quitting: 36.2  . Smokeless tobacco: Never Used  Substance and Sexual Activity  . Alcohol use: No    Alcohol/week: 0.0 standard drinks  . Drug use: No  . Sexual activity: Yes    Partners: Male  Other Topics Concern  . Not on file  Social History Narrative   Married and husband has a lot of medical problems, he is working  again    Investment banker, operational of Radio broadcast assistant Strain: Medium Risk  . Difficulty of Paying Living Expenses: Somewhat hard  Food Insecurity: No Food Insecurity  . Worried About Charity fundraiser in the Last Year: Never true  . Ran Out of Food in the Last Year: Never true  Transportation Needs: No Transportation Needs  . Lack of Transportation (Medical): No  . Lack of Transportation (Non-Medical): No  Physical Activity: Sufficiently Active  . Days of Exercise per Week: 5 days  . Minutes of Exercise per Session: 30 min  Stress: Stress Concern Present  . Feeling of Stress : To some extent  Social Connections: Not Isolated  . Frequency of Communication with Friends and Family: More than three times a week  . Frequency of Social Gatherings with Friends and Family: More than three times a week  . Attends Religious Services: More than 4 times per year  . Active Member of Clubs or Organizations: Yes  .  Attends Archivist Meetings: More than 4 times per year  . Marital Status: Married    Allergies:  Allergies  Allergen Reactions  . Penicillins Anaphylaxis, Hives, Nausea And Vomiting and Swelling    Has patient had a PCN reaction causing immediate rash, facial/tongue/throat swelling, SOB or lightheadedness with hypotension: Yes Has patient had a PCN reaction causing severe rash involving mucus membranes or skin necrosis: Yes Has patient had a PCN reaction that required hospitalization No Has patient had a PCN reaction occurring within the last 10 years: Yes If all of the above answers are "NO", then may proceed with Cephalosporin use.   . Chlorhexidine Gluconate Itching and Rash  . Ciprofloxacin Hives  . Clindamycin/Lincomycin Hives  . Erythromycin Hives and Nausea And Vomiting  . Keflex [Cephalexin] Hives  . Lyrica [Pregabalin]     Dizziness, syncope  . Nitrofurantoin Monohyd Macro Hives and Nausea And Vomiting  . Other Hives  . Sulfa Antibiotics Hives and  Nausea And Vomiting    Rapid heart rate  . Tetracyclines & Related Hives  . Adhesive [Tape] Rash  . Latex Rash  . Vancomycin Itching and Rash    Metabolic Disorder Labs: Lab Results  Component Value Date   HGBA1C 5.3 03/31/2019   MPG 105 03/31/2019   MPG 108 06/17/2018   No results found for: PROLACTIN Lab Results  Component Value Date   CHOL 160 06/17/2018   TRIG 119 06/17/2018   HDL 56 06/17/2018   CHOLHDL 2.9 06/17/2018   VLDL 22 12/31/2016   LDLCALC 82 06/17/2018   LDLCALC 64 12/31/2016   Lab Results  Component Value Date   TSH 1.75 03/31/2019   TSH 2.56 12/31/2016    Therapeutic Level Labs: No results found for: LITHIUM No results found for: VALPROATE No components found for:  CBMZ  Current Medications: Current Outpatient Medications  Medication Sig Dispense Refill  . acetaminophen (TYLENOL) 325 MG tablet Take 650 mg by mouth every 6 (six) hours as needed.    . Cholecalciferol 125 MCG (5000 UT) capsule Take by mouth.    . fluticasone (FLONASE) 50 MCG/ACT nasal spray Place 2 sprays into both nostrils daily. 16 g 2  . hydrOXYzine (ATARAX/VISTARIL) 25 MG tablet Take 1 tablet (25 mg total) by mouth 2 (two) times daily as needed for anxiety. 60 tablet 1  . loratadine (CLARITIN) 10 MG tablet Take 1 tablet (10 mg total) by mouth daily. 90 tablet 3  . mirtazapine (REMERON) 7.5 MG tablet Take 1 tablet (7.5 mg total) by mouth at bedtime. For sleep and anxiety 30 tablet 1  . montelukast (SINGULAIR) 10 MG tablet Take 1 tablet (10 mg total) by mouth at bedtime. 90 tablet 1  . omeprazole (PRILOSEC) 40 MG capsule Take 1 capsule (40 mg total) by mouth every morning. 90 capsule 1  . prazosin (MINIPRESS) 1 MG capsule Take 1 capsule (1 mg total) by mouth at bedtime. 90 capsule 1  . rOPINIRole (REQUIP) 1 MG tablet TAKE ONE TABLET BY MOUTH EVERY NIGHT AT BEDTIME 90 tablet 0  . SUMAtriptan (IMITREX) 100 MG tablet Take 1 tablet (100 mg total) by mouth every 2 (two) hours as needed for  migraine. May repeat in 2 hours if headache persists or recurs. 10 tablet 0  . tizanidine (ZANAFLEX) 2 MG capsule Take 1 capsule (2 mg total) by mouth daily as needed for muscle spasms. 90 capsule 0  . topiramate (TOPAMAX) 100 MG tablet Take 1 tablet (100 mg total) by mouth every  evening. 90 tablet 1  . venlafaxine XR (EFFEXOR XR) 75 MG 24 hr capsule Take 1 capsule (75 mg total) by mouth daily with breakfast. To be combined with 150 mg 90 capsule 0  . venlafaxine XR (EFFEXOR-XR) 150 MG 24 hr capsule Take 1 capsule (150 mg total) by mouth daily with breakfast. 90 capsule 1   No current facility-administered medications for this visit.     Musculoskeletal: Strength & Muscle Tone: UTA Gait & Station: normal Patient leans: NA  Psychiatric Specialty Exam: Review of Systems  HENT: Positive for sinus pressure, sinus pain and tinnitus.   Neurological: Positive for dizziness.  Psychiatric/Behavioral: Positive for sleep disturbance. The patient is nervous/anxious.   All other systems reviewed and are negative.   There were no vitals taken for this visit.There is no height or weight on file to calculate BMI.  General Appearance: Casual  Eye Contact:  Fair  Speech:  Normal Rate  Volume:  Normal  Mood:  Anxious  Affect:  Congruent  Thought Process:  Goal Directed and Descriptions of Associations: Intact  Orientation:  Full (Time, Place, and Person)  Thought Content: Rumination   Suicidal Thoughts:  No  Homicidal Thoughts:  No  Memory:  Immediate;   Fair Recent;   Fair Remote;   Fair  Judgement:  Fair  Insight:  Fair  Psychomotor Activity:  Normal  Concentration:  Concentration: Fair and Attention Span: Fair  Recall:  [employer] of Knowledge: Fair  Language: Fair  Akathisia:  No  Handed:  Right  AIMS (if indicated): UTA  Assets:  Communication Skills Desire for Improvement Housing Social Support  ADL's:  Intact  Cognition: WNL  Sleep:  Poor   Screenings: GAD-7     Office  Visit from 06/20/2019 in [facility] Office Visit from 03/23/2019 in [facility] Office Visit from 02/22/2019 in [facility] Office Visit from 09/20/2018 in [facility] Office Visit from 08/30/2018 in [facility]  Total GAD-7 Score  2  1  1  11  10     PHQ2-9     Office Visit from 06/20/2019 in [facility] Office Visit from 03/23/2019 in [facility] Office Visit from 02/22/2019 in [facility] Office Visit from 01/12/2019 in [facility] Office Visit from 09/20/2018 in [facility]  PHQ-2 Total Score  1  0  1  2  3   PHQ-9 Total Score  6  2  4  7  12        Assessment and Plan: Sophya is a 54 year old Caucasian female, married, employed, lives in [place], has a history of MDD, GAD, panic attacks, insomnia, GAD, arthritis, chronic pain was evaluated by telemedicine today.  Patient is biologically predisposed given her family history, history of trauma, multiple medical problems.  Patient with psychosocial stressors of legal issues, financial problems, chronic pain is currently struggling with anxiety symptoms.  She will continue to benefit from medication readjustment.  Plan MDD-some progress Venlafaxine 225 mg p.o. daily Patient recently completed IOP. Add Remeron 7.5 mg p.o. nightly  GAD-unstable Venlafaxine as prescribed Add Remeron 7.5 mg p.o. nightly Continue CBT  Panic disorder-improving Venlafaxine as prescribed Hydroxyzine 25 mg p.o. twice daily as needed  Insomnia-unstable Prazosin 1 mg p.o. nightly for nightmares Start Remeron 7.5 mg p.o. nightly for sleep  I have reviewed medical records in [facility] per Ms.Ricky Ala -admission date 09/19/2019-discharge date 10/03/2019  with [facility] intensive outpatient program-' patient advised to continue to follow-up with her outpatient  providers.'  Crisis plan discussed with patient.  Follow-up in clinic in 3 weeks or sooner if needed.  I have spent atleast 20 minutes non face to face with patient today. More than 50 % of the time was spent for preparing to see the patient ( e.g., review of test, records ), obtaining and to review and separately obtained history , ordering medications and test ,psychoeducation and supportive psychotherapy and care coordination,as well as documenting clinical information in electronic health record. This note was generated in part or whole with voice recognition software. Voice recognition is usually quite accurate but there are transcription errors that can and very often do occur. I apologize for any typographical errors that were not detected and corrected.       Ursula Alert, MD 10/10/2019, 2:20 PM

## 2019-10-12 ENCOUNTER — Encounter: Payer: Self-pay | Admitting: Family Medicine

## 2019-10-12 ENCOUNTER — Ambulatory Visit (INDEPENDENT_AMBULATORY_CARE_PROVIDER_SITE_OTHER): Payer: BC Managed Care – PPO | Admitting: Family Medicine

## 2019-10-12 ENCOUNTER — Other Ambulatory Visit: Payer: Self-pay

## 2019-10-12 DIAGNOSIS — K219 Gastro-esophageal reflux disease without esophagitis: Secondary | ICD-10-CM | POA: Diagnosis not present

## 2019-10-12 DIAGNOSIS — M542 Cervicalgia: Secondary | ICD-10-CM

## 2019-10-12 DIAGNOSIS — M5441 Lumbago with sciatica, right side: Secondary | ICD-10-CM

## 2019-10-12 DIAGNOSIS — E8881 Metabolic syndrome: Secondary | ICD-10-CM

## 2019-10-12 DIAGNOSIS — J302 Other seasonal allergic rhinitis: Secondary | ICD-10-CM

## 2019-10-12 DIAGNOSIS — M5442 Lumbago with sciatica, left side: Secondary | ICD-10-CM

## 2019-10-12 DIAGNOSIS — G8929 Other chronic pain: Secondary | ICD-10-CM

## 2019-10-12 DIAGNOSIS — E559 Vitamin D deficiency, unspecified: Secondary | ICD-10-CM

## 2019-10-12 DIAGNOSIS — J3089 Other allergic rhinitis: Secondary | ICD-10-CM

## 2019-10-12 DIAGNOSIS — G2581 Restless legs syndrome: Secondary | ICD-10-CM | POA: Diagnosis not present

## 2019-10-12 DIAGNOSIS — Z1231 Encounter for screening mammogram for malignant neoplasm of breast: Secondary | ICD-10-CM

## 2019-10-12 DIAGNOSIS — G43009 Migraine without aura, not intractable, without status migrainosus: Secondary | ICD-10-CM

## 2019-10-12 DIAGNOSIS — F322 Major depressive disorder, single episode, severe without psychotic features: Secondary | ICD-10-CM

## 2019-10-12 MED ORDER — ROPINIROLE HCL 1 MG PO TABS: 1.0000 mg | ORAL_TABLET | Freq: Every day | ORAL | 1 refills | Status: DC

## 2019-10-12 MED ORDER — MONTELUKAST SODIUM 10 MG PO TABS: 10.0000 mg | ORAL_TABLET | Freq: Every day | ORAL | 1 refills | Status: DC

## 2019-10-12 MED ORDER — FLUTICASONE PROPIONATE 50 MCG/ACT NA SUSP: 2.0000 | Freq: Every day | NASAL | 2 refills | Status: DC

## 2019-10-12 MED ORDER — LORATADINE 10 MG PO TABS: 10.0000 mg | ORAL_TABLET | Freq: Every day | ORAL | 3 refills | Status: DC

## 2019-10-12 MED ORDER — TIZANIDINE HCL 2 MG PO CAPS: 2.0000 mg | ORAL_CAPSULE | Freq: Every day | ORAL | 0 refills | Status: DC | PRN
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
## 2019-10-26 ENCOUNTER — Other Ambulatory Visit: Payer: Self-pay | Admitting: Family Medicine

## 2019-10-26 DIAGNOSIS — G43009 Migraine without aura, not intractable, without status migrainosus: Secondary | ICD-10-CM

## 2019-10-26 NOTE — Telephone Encounter (Signed)
Requested medication (s) are due for refill today: Yes  Requested medication (s) are on the active medication list: Yes  Last refill:  05/31/19  Future visit scheduled: Yes  Notes to clinic:  Dosage has been changed.    Requested Prescriptions  Pending Prescriptions Disp Refills   topiramate (TOPAMAX) 50 MG tablet [Pharmacy Med Name: TOPIRAMATE 50 MG TABLET] 180 tablet 0    Sig: TAKE ONE TABLET BY MOUTH TWICE A DAY      Not Delegated - Neurology: Anticonvulsants - topiramate & zonisamide Failed - 10/26/2019  9:43 AM      Failed - This refill cannot be delegated      Passed - Cr in normal range and within 360 days    Creat  Date Value Ref Range Status  03/31/2019 0.79 0.50 - 1.05 mg/dL Final    Comment:    For patients >22 years of age, the reference limit for Creatinine is approximately 13% higher for people identified as African-American. .           Passed - CO2 in normal range and within 360 days    CO2  Date Value Ref Range Status  03/31/2019 20 20 - 32 mmol/L Final          Passed - Valid encounter within last 12 months    Recent Outpatient Visits           2 weeks ago Vitamin D deficiency   [facility], Drue Stager, MD   4 months ago Migraine without aura and responsive to treatment   [facility] Steele Sizer, MD   7 months ago Migraine without aura and responsive to treatment   [facility] Steele Sizer, MD   8 months ago Well woman exam   [facility] Steele Sizer, MD   9 months ago Migraine without aura and responsive to treatment   [facility] Steele Sizer, MD       Future Appointments             In 5 months Ancil Boozer, Drue Stager, MD [facility], [facility]

## 2019-10-27 NOTE — Telephone Encounter (Signed)
Patient is taking 100 mg p.o at bedtime.

## 2019-10-27 NOTE — Telephone Encounter (Signed)
Patient is taking 100 mg at bedtime. They did not have the 100 when she first started it. She does not need this medication right now she has it.

## 2019-10-31 ENCOUNTER — Encounter: Payer: Self-pay | Admitting: Psychiatry

## 2019-10-31 ENCOUNTER — Other Ambulatory Visit: Payer: Self-pay

## 2019-10-31 ENCOUNTER — Telehealth (INDEPENDENT_AMBULATORY_CARE_PROVIDER_SITE_OTHER): Payer: BC Managed Care – PPO | Admitting: Psychiatry

## 2019-10-31 DIAGNOSIS — F5105 Insomnia due to other mental disorder: Secondary | ICD-10-CM | POA: Diagnosis not present

## 2019-10-31 DIAGNOSIS — F411 Generalized anxiety disorder: Secondary | ICD-10-CM

## 2019-10-31 DIAGNOSIS — F410 Panic disorder [episodic paroxysmal anxiety] without agoraphobia: Secondary | ICD-10-CM | POA: Diagnosis not present

## 2019-10-31 DIAGNOSIS — F331 Major depressive disorder, recurrent, moderate: Secondary | ICD-10-CM | POA: Diagnosis not present

## 2019-10-31 NOTE — Progress Notes (Signed)
Provider Location : [facility] Patient Location : Home  Virtual Visit via Video Note  I connected with Kelly [name] on 10/31/19 at 10:00 AM EDT by a video enabled telemedicine application and verified that I am speaking with the correct person using two identifiers.   I discussed the limitations of evaluation and management by telemedicine and the availability of in person appointments. The patient expressed understanding and agreed to proceed.    I discussed the assessment and treatment plan with the patient. The patient was provided an opportunity to ask questions and all were answered. The patient agreed with the plan and demonstrated an understanding of the instructions.   The patient was advised to call back or seek an in-person evaluation if the symptoms worsen or if the condition fails to improve as anticipated.   [facility] MD OP Progress Note  10/31/2019 12:39 PM Kelly [name]  MRN:  [id number]  Chief Complaint:  Chief Complaint    Follow-up     HPI: Kelly [name] is a 54 year old Caucasian female who is married, lives in [place], has a history of depression, anxiety disorder, panic disorder, insomnia, gastroesophageal reflux disease, lumbar disc problem, migraine headaches was evaluated by telemedicine today.  Patient today reports she continues to have psychosocial stressor of the upcoming court hearing on Friday, April 30 regarding child support.  She reports she hence has been worried.  This is keeping her up at night.  Patient however reports she has been reaching out to her therapist and is trying to cope with her anxiety as best as she can.  She has been making use of her coping techniques.  She reports she wants to continue the medications as it is for now and is not interested in dosage readjustment since they are beneficial.  She denies side effects to medications.  Patient denies any suicidality, homicidality or perceptual disturbances.  She denies any other  concerns today. Visit Diagnosis:    ICD-10-CM   1. MDD (major depressive disorder), recurrent episode, moderate (HCC)  F33.1   2. GAD (generalized anxiety disorder)  F41.1   3. Panic disorder  F41.0   4. Insomnia due to mental condition  F51.05     Past Psychiatric History: I have reviewed past psychiatric history from my progress note on 10/25/2018.  Past trials of medications like Paxil, trazodone, hydroxyzine.  Patient completed IOP-10/03/2019.  Past Medical History:  Past Medical History:  Diagnosis Date  . Allergy   . Anxiety   . Depression   . GERD (gastroesophageal reflux disease)   . History of kidney stones   . Insomnia   . Intermittent low back pain   . Migraines   . Osteoarthritis   . Recurrent UTI   . Restless leg syndrome   . Sciatica of right side   . Symptomatic menopausal or female climacteric states   . Vertigo   . Vitamin D deficiency     Past Surgical History:  Procedure Laterality Date  . BACK SURGERY    . COLONOSCOPY WITH PROPOFOL N/A 02/03/2019   Procedure: COLONOSCOPY WITH PROPOFOL;  Surgeon: Jonathon Bellows, MD;  Location: [facility] ENDOSCOPY;  Service: Gastroenterology;  Laterality: N/A;  . COLONOSCOPY WITH PROPOFOL N/A 03/10/2019   Procedure: COLONOSCOPY WITH PROPOFOL;  Surgeon: Jonathon Bellows, MD;  Location: [facility] ENDOSCOPY;  Service: Gastroenterology;  Laterality: N/A;  . DIAGNOSTIC LAPAROSCOPY    . DILATION AND CURETTAGE OF UTERUS    . KNEE ARTHROSCOPY WITH MEDIAL MENISECTOMY Left 01/14/2017   Procedure: KNEE ARTHROSCOPY  WITH MEDIAL AND LATERAL MENISECTOMY;  Surgeon: Hessie Knows, MD;  Location: [facility] ORS;  Service: Orthopedics;  Laterality: Left;  Partial Knee menisectomy  . KNEE SURGERY Left 05/2011   Dr. Rudene Christians- Arthroscopic  . LUMBAR LAMINECTOMY/DECOMPRESSION MICRODISCECTOMY N/A 09/16/2016   Procedure: LUMBAR LAMINECTOMY/DECOMPRESSION MICRODISCECTOMY 1 LEVEL L5-S1;  Surgeon: Meade Maw, MD;  Location: [facility] ORS;  Service: Neurosurgery;  Laterality: N/A;   . TONSILLECTOMY AND ADENOIDECTOMY    . URETHRAL STRICTURE DILATATION      Family Psychiatric History: I have reviewed family psychiatric history from my progress note on 10/25/2018  Family History:  Family History  Problem Relation Age of Onset  . Cancer Paternal Grandmother   . Anxiety disorder Cousin   . Depression Cousin     Social History: I have reviewed social history from my progress note on 10/25/2018 Social History   Socioeconomic History  . Marital status: Married    Spouse name: Jenny Reichmann  . Number of children: 2  . Years of education: 48  . Highest education level: Associate degree: occupational, Hotel manager, or vocational program  Occupational History  . Occupation: not employed  Tobacco Use  . Smoking status: Former Smoker    Packs/day: 0.75    Years: 2.00    Pack years: 1.50    Types: Cigarettes    Start date: 07/06/1981    Quit date: 07/07/1983    Years since quitting: 36.3  . Smokeless tobacco: Never Used  Substance and Sexual Activity  . Alcohol use: No    Alcohol/week: 0.0 standard drinks  . Drug use: No  . Sexual activity: Yes    Partners: Male  Other Topics Concern  . Not on file  Social History Narrative   Married and husband has a lot of medical problems, he is working again    Investment banker, operational of Radio broadcast assistant Strain: Medium Risk  . Difficulty of Paying Living Expenses: Somewhat hard  Food Insecurity: No Food Insecurity  . Worried About Charity fundraiser in the Last Year: Never true  . Ran Out of Food in the Last Year: Never true  Transportation Needs: No Transportation Needs  . Lack of Transportation (Medical): No  . Lack of Transportation (Non-Medical): No  Physical Activity: Sufficiently Active  . Days of Exercise per Week: 5 days  . Minutes of Exercise per Session: 30 min  Stress: Stress Concern Present  . Feeling of Stress : To some extent  Social Connections: Not Isolated  . Frequency of Communication with Friends and  Family: More than three times a week  . Frequency of Social Gatherings with Friends and Family: More than three times a week  . Attends Religious Services: More than 4 times per year  . Active Member of Clubs or Organizations: Yes  . Attends Archivist Meetings: More than 4 times per year  . Marital Status: Married    Allergies:  Allergies  Allergen Reactions  . Penicillins Anaphylaxis, Hives, Nausea And Vomiting and Swelling    Has patient had a PCN reaction causing immediate rash, facial/tongue/throat swelling, SOB or lightheadedness with hypotension: Yes Has patient had a PCN reaction causing severe rash involving mucus membranes or skin necrosis: Yes Has patient had a PCN reaction that required hospitalization No Has patient had a PCN reaction occurring within the last 10 years: Yes If all of the above answers are "NO", then may proceed with Cephalosporin use.   . Chlorhexidine Gluconate Itching and Rash  . Ciprofloxacin Hives  .  Clindamycin/Lincomycin Hives  . Erythromycin Hives and Nausea And Vomiting  . Keflex [Cephalexin] Hives  . Lyrica [Pregabalin]     Dizziness, syncope  . Nitrofurantoin Monohyd Macro Hives and Nausea And Vomiting  . Other Hives  . Sulfa Antibiotics Hives and Nausea And Vomiting    Rapid heart rate  . Tetracyclines & Related Hives  . Adhesive [Tape] Rash  . Latex Rash  . Vancomycin Itching and Rash    Metabolic Disorder Labs: Lab Results  Component Value Date   HGBA1C 5.3 03/31/2019   MPG 105 03/31/2019   MPG 108 06/17/2018   No results found for: PROLACTIN Lab Results  Component Value Date   CHOL 160 06/17/2018   TRIG 119 06/17/2018   HDL 56 06/17/2018   CHOLHDL 2.9 06/17/2018   VLDL 22 12/31/2016   LDLCALC 82 06/17/2018   LDLCALC 64 12/31/2016   Lab Results  Component Value Date   TSH 1.75 03/31/2019   TSH 2.56 12/31/2016    Therapeutic Level Labs: No results found for: LITHIUM No results found for: VALPROATE No  components found for:  CBMZ  Current Medications: Current Outpatient Medications  Medication Sig Dispense Refill  . acetaminophen (TYLENOL) 325 MG tablet Take 650 mg by mouth every 6 (six) hours as needed.    . Cholecalciferol 125 MCG (5000 UT) capsule Take by mouth.    . fluticasone (FLONASE) 50 MCG/ACT nasal spray Place 2 sprays into both nostrils daily. 16 g 2  . hydrOXYzine (ATARAX/VISTARIL) 25 MG tablet Take 1 tablet (25 mg total) by mouth 2 (two) times daily as needed for anxiety. 60 tablet 1  . loratadine (CLARITIN) 10 MG tablet Take 1 tablet (10 mg total) by mouth daily. 90 tablet 3  . mirtazapine (REMERON) 7.5 MG tablet Take 1 tablet (7.5 mg total) by mouth at bedtime. For sleep and anxiety 30 tablet 1  . montelukast (SINGULAIR) 10 MG tablet Take 1 tablet (10 mg total) by mouth at bedtime. 90 tablet 1  . omeprazole (PRILOSEC) 40 MG capsule Take 1 capsule (40 mg total) by mouth every morning. 90 capsule 1  . prazosin (MINIPRESS) 1 MG capsule Take 1 capsule (1 mg total) by mouth at bedtime. 90 capsule 1  . rOPINIRole (REQUIP) 1 MG tablet Take 1 tablet (1 mg total) by mouth at bedtime. 90 tablet 1  . SUMAtriptan (IMITREX) 100 MG tablet Take 1 tablet (100 mg total) by mouth every 2 (two) hours as needed for migraine. May repeat in 2 hours if headache persists or recurs. 10 tablet 0  . tizanidine (ZANAFLEX) 2 MG capsule Take 1 capsule (2 mg total) by mouth daily as needed for muscle spasms. 90 capsule 0  . topiramate (TOPAMAX) 100 MG tablet Take 1 tablet (100 mg total) by mouth every evening. 90 tablet 1  . venlafaxine XR (EFFEXOR XR) 75 MG 24 hr capsule Take 1 capsule (75 mg total) by mouth daily with breakfast. To be combined with 150 mg 90 capsule 0  . venlafaxine XR (EFFEXOR-XR) 150 MG 24 hr capsule Take 1 capsule (150 mg total) by mouth daily with breakfast. 90 capsule 1   No current facility-administered medications for this visit.     Musculoskeletal: Strength & Muscle Tone: UTA   Gait & Station: normal Patient leans: N/A  Psychiatric Specialty Exam: Review of Systems  Psychiatric/Behavioral: Positive for sleep disturbance. The patient is nervous/anxious.   All other systems reviewed and are negative.   There were no vitals taken for this  visit.There is no height or weight on file to calculate BMI.  General Appearance: Casual  Eye Contact:  Fair  Speech:  Clear and Coherent  Volume:  Normal  Mood:  Anxious  Affect:  Congruent  Thought Process:  Goal Directed and Descriptions of Associations: Intact  Orientation:  Full (Time, Place, and Person)  Thought Content: Logical   Suicidal Thoughts:  No  Homicidal Thoughts:  No  Memory:  Immediate;   Fair Recent;   Fair Remote;   Fair  Judgement:  Fair  Insight:  Fair  Psychomotor Activity:  Normal  Concentration:  Concentration: Fair and Attention Span: Fair  Recall:  [employer] of Knowledge: Fair  Language: Fair  Akathisia:  No  Handed:  Right  AIMS (if indicated):UTA  Assets:  Communication Skills Desire for Improvement Housing Social Support  ADL's:  Intact  Cognition: WNL  Sleep:  Restless   Screenings: GAD-7     Office Visit from 10/12/2019 in [facility] Office Visit from 06/20/2019 in [facility] Office Visit from 03/23/2019 in [facility] Office Visit from 02/22/2019 in [facility] Office Visit from 09/20/2018 in [facility]  Total GAD-7 Score  15  2  1  1  11     PHQ2-9     Office Visit from 10/12/2019 in [facility] Office Visit from 06/20/2019 in [facility] Office Visit from 03/23/2019 in [facility] Office Visit from 02/22/2019 in [facility] Office Visit from 01/12/2019 in [facility]  PHQ-2 Total Score  6  1  0  1  2  PHQ-9 Total Score  20  6  2  4  7        Assessment and Plan: Britteney is a  54 year old Caucasian female, married, employed, lives in [place], has a history of MDD, GAD, panic attacks, insomnia, GAD, arthritis, chronic pain was evaluated by telemedicine today.  Patient is biologically predisposed given her family history, history of trauma, multiple medical problems.  Patient with psychosocial stressors of legal issues, financial problems, chronic pain is currently struggling with anxiety symptoms due to her situational stressors.  Patient however has been able to cope and will continue psychotherapy sessions.  Plan as noted below.  Plan MDD-improving Venlafaxine 225 mg p.o. daily Remeron 7.5 mg p.o. nightly  GAD-improving Venlafaxine as prescribed Remeron 7.5 mg p.o. nightly Continue CBT  Panic disorder-improving Venlafaxine as prescribed Hydroxyzine 25 mg p.o. twice daily as needed  Insomnia-restless Patient does have psychosocial stressors with upcoming court hearing which does make her anxious and restless. Prazosin 1 mg p.o. nightly for nightmares Remeron 7.5 mg p.o. nightly for sleep  Crisis plan discussed with patient.  Patient agrees to go to the nearest emergency department if she has suicidal thoughts.  Follow-up in clinic in 2 weeks or sooner if needed.  I have spent atleast 20 minutes non face to face with patient today. More than 50 % of the time was spent for preparing to see the patient ( e.g., review of test, records ), ordering medications and test ,psychoeducation and supportive psychotherapy and care coordination,as well as documenting clinical information in electronic health record. This note was generated in part or whole with voice recognition software. Voice recognition is usually quite accurate but there are transcription errors that can and very often do occur. I apologize for any typographical errors that were not detected and corrected.  Ursula Alert, MD 10/31/2019, 12:38 PM

## 2019-11-10 ENCOUNTER — Encounter: Payer: Self-pay | Admitting: Psychiatry

## 2019-11-10 ENCOUNTER — Other Ambulatory Visit: Payer: Self-pay

## 2019-11-10 ENCOUNTER — Telehealth (INDEPENDENT_AMBULATORY_CARE_PROVIDER_SITE_OTHER): Payer: BC Managed Care – PPO | Admitting: Psychiatry

## 2019-11-10 DIAGNOSIS — F410 Panic disorder [episodic paroxysmal anxiety] without agoraphobia: Secondary | ICD-10-CM | POA: Diagnosis not present

## 2019-11-10 DIAGNOSIS — F331 Major depressive disorder, recurrent, moderate: Secondary | ICD-10-CM | POA: Diagnosis not present

## 2019-11-10 DIAGNOSIS — F5105 Insomnia due to other mental disorder: Secondary | ICD-10-CM

## 2019-11-10 DIAGNOSIS — F411 Generalized anxiety disorder: Secondary | ICD-10-CM | POA: Diagnosis not present

## 2019-11-10 NOTE — Progress Notes (Signed)
Provider Location : [facility] Patient Location : Home  Virtual Visit via Video Note  I connected with Kelly [name] on 11/10/19 at  8:40 AM EDT by a video enabled telemedicine application and verified that I am speaking with the correct person using two identifiers.   I discussed the limitations of evaluation and management by telemedicine and the availability of in person appointments. The patient expressed understanding and agreed to proceed.     I discussed the assessment and treatment plan with the patient. The patient was provided an opportunity to ask questions and all were answered. The patient agreed with the plan and demonstrated an understanding of the instructions.   The patient was advised to call back or seek an in-person evaluation if the symptoms worsen or if the condition fails to improve as anticipated.   [place] MD OP Progress Note  11/10/2019 12:57 PM MYLASIA HEDMAN  MRN:  [id number]  Chief Complaint:  Chief Complaint    Follow-up     HPI: Kelly [name] is a 54 year old Caucasian female who is married, lives in [place], has a history of depression, anxiety disorder, panic disorder, insomnia, gastroesophageal reflux disease, lumbar disc problem, migraine headaches was evaluated by telemedicine today.  Patient today reports she is currently making progress.  Her anxiety and depressive symptoms are improving.  She reports she has been sleeping better than before.  She continues to have some nightmares on and off however that is improving.  She is compliant on medications as prescribed.  She reports the Remeron does make her groggy some days.  Patient continues to struggle with back pain which limits her physically.  She wants to exercise more, so she can lose weight.  She is planning to follow-up with her pain provider for the same.  She reports the recent court hearing for child support went in her favor.  She reports that does help her to feel better.  Patient  continues to follow-up with her therapist Ms. Marjie Skiff.  Patient reports therapy sessions is beneficial.  She does report continues to have  hypervigilance, possible paranoia in social situations.  She however reports she lives [place] very bad neighborhood and does have a lot of previous trauma which has made her to feel this way.  She however reports she has been getting out more, spending more time in her yard which are all improvement for her.  Patient denies any other concerns today.    Visit Diagnosis:    ICD-10-CM   1. MDD (major depressive disorder), recurrent episode, moderate (HCC)  F33.1    improving  2. GAD (generalized anxiety disorder)  F41.1   3. Panic disorder  F41.0   4. Insomnia due to mental condition  F51.05     Past Psychiatric History: I have reviewed past psychiatric history from my progress note on 10/25/2018.  Past trials of medications like Paxil, trazodone, hydroxyzine.  Patient completed IOP-10/03/2019.  Past Medical History:  Past Medical History:  Diagnosis Date  . Allergy   . Anxiety   . Depression   . GERD (gastroesophageal reflux disease)   . History of kidney stones   . Insomnia   . Intermittent low back pain   . Migraines   . Osteoarthritis   . Recurrent UTI   . Restless leg syndrome   . Sciatica of right side   . Symptomatic menopausal or female climacteric states   . Vertigo   . Vitamin D deficiency     Past  Surgical History:  Procedure Laterality Date  . BACK SURGERY    . COLONOSCOPY WITH PROPOFOL N/A 02/03/2019   Procedure: COLONOSCOPY WITH PROPOFOL;  Surgeon: Jonathon Bellows, MD;  Location: [facility] ENDOSCOPY;  Service: Gastroenterology;  Laterality: N/A;  . COLONOSCOPY WITH PROPOFOL N/A 03/10/2019   Procedure: COLONOSCOPY WITH PROPOFOL;  Surgeon: Jonathon Bellows, MD;  Location: [facility] ENDOSCOPY;  Service: Gastroenterology;  Laterality: N/A;  . DIAGNOSTIC LAPAROSCOPY    . DILATION AND CURETTAGE OF UTERUS    . KNEE ARTHROSCOPY WITH MEDIAL  MENISECTOMY Left 01/14/2017   Procedure: KNEE ARTHROSCOPY WITH MEDIAL AND LATERAL MENISECTOMY;  Surgeon: Hessie Knows, MD;  Location: [facility] ORS;  Service: Orthopedics;  Laterality: Left;  Partial Knee menisectomy  . KNEE SURGERY Left 05/2011   Dr. Rudene Christians- Arthroscopic  . LUMBAR LAMINECTOMY/DECOMPRESSION MICRODISCECTOMY N/A 09/16/2016   Procedure: LUMBAR LAMINECTOMY/DECOMPRESSION MICRODISCECTOMY 1 LEVEL L5-S1;  Surgeon: Meade Maw, MD;  Location: [facility] ORS;  Service: Neurosurgery;  Laterality: N/A;  . TONSILLECTOMY AND ADENOIDECTOMY    . URETHRAL STRICTURE DILATATION      Family Psychiatric History: I have reviewed family psychiatric history from my progress note on 10/25/2018.  Family History:  Family History  Problem Relation Age of Onset  . Cancer Paternal Grandmother   . Anxiety disorder Cousin   . Depression Cousin     Social History: I have reviewed social history from my progress note on 10/25/2018. Social History   Socioeconomic History  . Marital status: Married    Spouse name: Jenny Reichmann  . Number of children: 2  . Years of education: 10  . Highest education level: Associate degree: occupational, Hotel manager, or vocational program  Occupational History  . Occupation: not employed  Tobacco Use  . Smoking status: Former Smoker    Packs/day: 0.75    Years: 2.00    Pack years: 1.50    Types: Cigarettes    Start date: 07/06/1981    Quit date: 07/07/1983    Years since quitting: 36.3  . Smokeless tobacco: Never Used  Substance and Sexual Activity  . Alcohol use: No    Alcohol/week: 0.0 standard drinks  . Drug use: No  . Sexual activity: Yes    Partners: Male  Other Topics Concern  . Not on file  Social History Narrative   Married and husband has a lot of medical problems, he is working again    Investment banker, operational of Radio broadcast assistant Strain: Medium Risk  . Difficulty of Paying Living Expenses: Somewhat hard  Food Insecurity: No Food Insecurity  . Worried  About Charity fundraiser in the Last Year: Never true  . Ran Out of Food in the Last Year: Never true  Transportation Needs: No Transportation Needs  . Lack of Transportation (Medical): No  . Lack of Transportation (Non-Medical): No  Physical Activity: Sufficiently Active  . Days of Exercise per Week: 5 days  . Minutes of Exercise per Session: 30 min  Stress: Stress Concern Present  . Feeling of Stress : To some extent  Social Connections: Not Isolated  . Frequency of Communication with Friends and Family: More than three times a week  . Frequency of Social Gatherings with Friends and Family: More than three times a week  . Attends Religious Services: More than 4 times per year  . Active Member of Clubs or Organizations: Yes  . Attends Archivist Meetings: More than 4 times per year  . Marital Status: Married    Allergies:  Allergies  Allergen  Reactions  . Penicillins Anaphylaxis, Hives, Nausea And Vomiting and Swelling    Has patient had a PCN reaction causing immediate rash, facial/tongue/throat swelling, SOB or lightheadedness with hypotension: Yes Has patient had a PCN reaction causing severe rash involving mucus membranes or skin necrosis: Yes Has patient had a PCN reaction that required hospitalization No Has patient had a PCN reaction occurring within the last 10 years: Yes If all of the above answers are "NO", then may proceed with Cephalosporin use.   . Chlorhexidine Gluconate Itching and Rash  . Ciprofloxacin Hives  . Clindamycin/Lincomycin Hives  . Erythromycin Hives and Nausea And Vomiting  . Keflex [Cephalexin] Hives  . Lyrica [Pregabalin]     Dizziness, syncope  . Nitrofurantoin Monohyd Macro Hives and Nausea And Vomiting  . Other Hives  . Sulfa Antibiotics Hives and Nausea And Vomiting    Rapid heart rate  . Tetracyclines & Related Hives  . Adhesive [Tape] Rash  . Latex Rash  . Vancomycin Itching and Rash    Metabolic Disorder Labs: Lab Results   Component Value Date   HGBA1C 5.3 03/31/2019   MPG 105 03/31/2019   MPG 108 06/17/2018   No results found for: PROLACTIN Lab Results  Component Value Date   CHOL 160 06/17/2018   TRIG 119 06/17/2018   HDL 56 06/17/2018   CHOLHDL 2.9 06/17/2018   VLDL 22 12/31/2016   LDLCALC 82 06/17/2018   LDLCALC 64 12/31/2016   Lab Results  Component Value Date   TSH 1.75 03/31/2019   TSH 2.56 12/31/2016    Therapeutic Level Labs: No results found for: LITHIUM No results found for: VALPROATE No components found for:  CBMZ  Current Medications: Current Outpatient Medications  Medication Sig Dispense Refill  . acetaminophen (TYLENOL) 325 MG tablet Take 650 mg by mouth every 6 (six) hours as needed.    . Cholecalciferol 125 MCG (5000 UT) capsule Take by mouth.    . fluticasone (FLONASE) 50 MCG/ACT nasal spray Place 2 sprays into both nostrils daily. 16 g 2  . hydrOXYzine (ATARAX/VISTARIL) 25 MG tablet Take 1 tablet (25 mg total) by mouth 2 (two) times daily as needed for anxiety. 60 tablet 1  . loratadine (CLARITIN) 10 MG tablet Take 1 tablet (10 mg total) by mouth daily. 90 tablet 3  . mirtazapine (REMERON) 7.5 MG tablet Take 1 tablet (7.5 mg total) by mouth at bedtime. For sleep and anxiety 30 tablet 1  . montelukast (SINGULAIR) 10 MG tablet Take 1 tablet (10 mg total) by mouth at bedtime. 90 tablet 1  . omeprazole (PRILOSEC) 40 MG capsule Take 1 capsule (40 mg total) by mouth every morning. 90 capsule 1  . prazosin (MINIPRESS) 1 MG capsule Take 1 capsule (1 mg total) by mouth at bedtime. 90 capsule 1  . rOPINIRole (REQUIP) 1 MG tablet Take 1 tablet (1 mg total) by mouth at bedtime. 90 tablet 1  . SUMAtriptan (IMITREX) 100 MG tablet Take 1 tablet (100 mg total) by mouth every 2 (two) hours as needed for migraine. May repeat in 2 hours if headache persists or recurs. 10 tablet 0  . tizanidine (ZANAFLEX) 2 MG capsule Take 1 capsule (2 mg total) by mouth daily as needed for muscle spasms. 90  capsule 0  . topiramate (TOPAMAX) 100 MG tablet Take 1 tablet (100 mg total) by mouth every evening. 90 tablet 1  . venlafaxine XR (EFFEXOR XR) 75 MG 24 hr capsule Take 1 capsule (75 mg total) by mouth  daily with breakfast. To be combined with 150 mg 90 capsule 0  . venlafaxine XR (EFFEXOR-XR) 150 MG 24 hr capsule Take 1 capsule (150 mg total) by mouth daily with breakfast. 90 capsule 1   No current facility-administered medications for this visit.     Musculoskeletal: Strength & Muscle Tone: UTA Gait & Station: normal Patient leans: N/A  Psychiatric Specialty Exam: Review of Systems  Musculoskeletal: Positive for back pain.  Psychiatric/Behavioral: The patient is nervous/anxious.   All other systems reviewed and are negative.   There were no vitals taken for this visit.There is no height or weight on file to calculate BMI.  General Appearance: Casual  Eye Contact:  Good  Speech:  Normal Rate  Volume:  Normal  Mood:  Anxious improving  Affect:  Congruent  Thought Process:  Goal Directed and Descriptions of Associations: Intact  Orientation:  Full (Time, Place, and Person)  Thought Content: Logical   Suicidal Thoughts:  No  Homicidal Thoughts:  No  Memory:  Immediate;   Fair Recent;   Fair Remote;   Fair  Judgement:  Fair  Insight:  Fair  Psychomotor Activity:  Normal  Concentration:  Concentration: Fair and Attention Span: Fair  Recall:  [employer] of Knowledge: Fair  Language: Fair  Akathisia:  No  Handed:  Right  AIMS (if indicated): UTA  Assets:  Communication Skills Desire for Improvement Social Support  ADL's:  Intact  Cognition: WNL  Sleep:  Fair,nightmares    Screenings: GAD-7     Office Visit from 10/12/2019 in [facility] Office Visit from 06/20/2019 in [facility] Office Visit from 03/23/2019 in [facility] Office Visit from 02/22/2019 in [facility] Office Visit from 09/20/2018  in [facility]  Total GAD-7 Score  15  2  1  1  11     PHQ2-9     Office Visit from 10/12/2019 in [facility] Office Visit from 06/20/2019 in [facility] Office Visit from 03/23/2019 in [facility] Office Visit from 02/22/2019 in [facility] Office Visit from 01/12/2019 in [facility]  PHQ-2 Total Score  6  1  0  1  2  PHQ-9 Total Score  20  6  2  4  7        Assessment and Plan: Kelly [name] is a 54 year old Caucasian female, married, employed, lives in [place], has a history of MDD, GAD, panic, insomnia, arthritis, chronic pain was evaluated by telemedicine today.  Patient is biologically predisposed given her family history, history of trauma, multiple medical problems.  Patient with psychosocial stressors of physical limitations, financial problems, pain.  She is however currently making progress.  Plan as noted below.   Plan MDD-improving Venlafaxine 225 mg p.o. daily Remeron 7.5 mg p.o. nightly  GAD-improving Venlafaxine as prescribed Remeron 7.5 mg p.o. nightly Continue CBT  Panic disorder-improving Venlafaxine prescribed Hydroxyzine 25 mg p.o. twice daily as needed  Insomnia-improving Prazosin 1 mg p.o. nightly for nightmares Remeron 7.5 mg p.o. nightly for sleep    Follow-up in clinic in  4 weeks or sooner if needed.  I have spent atleast 20 minutes non face to face with patient today. More than 50 % of the time was spent for preparing to see the patient ( e.g., review of test, records ), ordering medications and test ,psychoeducation and supportive psychotherapy and care coordination,as well as documenting clinical  information in electronic health record. This note was generated in part or whole with voice recognition software. Voice recognition is usually quite accurate but there are transcription errors that can and very often do occur. I apologize  for any typographical errors that were not detected and corrected.      Ursula Alert, MD 11/10/2019, 12:57 PM

## 2019-11-26 ENCOUNTER — Other Ambulatory Visit: Payer: Self-pay | Admitting: Psychiatry

## 2019-11-26 DIAGNOSIS — F331 Major depressive disorder, recurrent, moderate: Secondary | ICD-10-CM

## 2019-12-08 ENCOUNTER — Telehealth (INDEPENDENT_AMBULATORY_CARE_PROVIDER_SITE_OTHER): Payer: BC Managed Care – PPO | Admitting: Psychiatry

## 2019-12-08 ENCOUNTER — Encounter: Payer: Self-pay | Admitting: Psychiatry

## 2019-12-08 ENCOUNTER — Other Ambulatory Visit: Payer: Self-pay

## 2019-12-08 DIAGNOSIS — F331 Major depressive disorder, recurrent, moderate: Secondary | ICD-10-CM

## 2019-12-08 DIAGNOSIS — F5105 Insomnia due to other mental disorder: Secondary | ICD-10-CM | POA: Diagnosis not present

## 2019-12-08 DIAGNOSIS — F410 Panic disorder [episodic paroxysmal anxiety] without agoraphobia: Secondary | ICD-10-CM | POA: Diagnosis not present

## 2019-12-08 DIAGNOSIS — F411 Generalized anxiety disorder: Secondary | ICD-10-CM

## 2019-12-08 MED ORDER — BUSPIRONE HCL 7.5 MG PO TABS: 7.5000 mg | ORAL_TABLET | Freq: Two times a day (BID) | ORAL | 1 refills | Status: DC

## 2019-12-08 MED ORDER — MIRTAZAPINE 7.5 MG PO TABS: 7.5000 mg | ORAL_TABLET | Freq: Every day | ORAL | 0 refills | Status: DC

## 2019-12-08 MED ORDER — VENLAFAXINE HCL ER 75 MG PO CP24: 75.0000 mg | ORAL_CAPSULE | Freq: Every day | ORAL | 0 refills | Status: DC

## 2019-12-08 NOTE — Progress Notes (Signed)
Provider Location : [facility] Patient Location : Home  Virtual Visit via Video Note  I connected with Kelly [name] on 12/08/19 at  9:20 AM EDT by a video enabled telemedicine application and verified that I am speaking with the correct person using two identifiers.   I discussed the limitations of evaluation and management by telemedicine and the availability of in person appointments. The patient expressed understanding and agreed to proceed.    I discussed the assessment and treatment plan with the patient. The patient was provided an opportunity to ask questions and all were answered. The patient agreed with the plan and demonstrated an understanding of the instructions.   The patient was advised to call back or seek an in-person evaluation if the symptoms worsen or if the condition fails to improve as anticipated.   [facility] MD OP Progress Note  12/08/2019 12:24 PM Kelly [name]  MRN:  [id number]  Chief Complaint:  Chief Complaint    Follow-up     HPI: Kelly [name] is a 54 year old Caucasian female who is married, lives in [place], has a history of depression, anxiety, insomnia, gastroesophageal reflux disease, lumbar disc problem, migraine headaches was evaluated by telemedicine today.  Patient today reports she has been struggling with a lot of pain recently.  She reports she is currently struggling with right hip joint pain.  She reports she rates her pain at a 20 out of 10 most of the time.  She reports the usual measures are not really helpful and she is currently thinking about going back to her pain provider Dr. Holley Raring.  Patient reports the pain does keep her up at night.  She reports her sleep is restless.  She also reports it does affect her mood.  She reports she does not have any motivation to do the things that she usually enjoys.  She reports recently her son and her husband renovated her kitchen and she did not feel like doing anything in the kitchen after they  did it, which is unusual for her.  She also reports feeling overwhelmed and anxious often.  She reports her anxiety is worse than her depression at this time.  Patient reports she is currently following up with her therapist.  Patient denies any suicidality, homicidality or perceptual disturbances.  Patient is compliant on medications and denies side effects.  She denies any other concerns today.  Visit Diagnosis:    ICD-10-CM   1. MDD (major depressive disorder), recurrent episode, moderate (HCC)  F33.1 venlafaxine XR (EFFEXOR XR) 75 MG 24 hr capsule  2. GAD (generalized anxiety disorder)  F41.1 venlafaxine XR (EFFEXOR XR) 75 MG 24 hr capsule    busPIRone (BUSPAR) 7.5 MG tablet    mirtazapine (REMERON) 7.5 MG tablet  3. Panic disorder  F41.0 busPIRone (BUSPAR) 7.5 MG tablet    mirtazapine (REMERON) 7.5 MG tablet  4. Insomnia due to mental condition  F51.05 mirtazapine (REMERON) 7.5 MG tablet    Past Psychiatric History: I have reviewed past psychiatric history from my progress note on 10/25/2018.  Past trials of medications like Paxil, trazodone, hydroxyzine.  Patient completed IOP-10/03/2019  Past Medical History:  Past Medical History:  Diagnosis Date  . Allergy   . Anxiety   . Depression   . GERD (gastroesophageal reflux disease)   . History of kidney stones   . Insomnia   . Intermittent low back pain   . Migraines   . Osteoarthritis   . Recurrent UTI   . Restless  leg syndrome   . Sciatica of right side   . Symptomatic menopausal or female climacteric states   . Vertigo   . Vitamin D deficiency     Past Surgical History:  Procedure Laterality Date  . BACK SURGERY    . COLONOSCOPY WITH PROPOFOL N/A 02/03/2019   Procedure: COLONOSCOPY WITH PROPOFOL;  Surgeon: Jonathon Bellows, MD;  Location: [facility] ENDOSCOPY;  Service: Gastroenterology;  Laterality: N/A;  . COLONOSCOPY WITH PROPOFOL N/A 03/10/2019   Procedure: COLONOSCOPY WITH PROPOFOL;  Surgeon: Jonathon Bellows, MD;  Location: [facility]  ENDOSCOPY;  Service: Gastroenterology;  Laterality: N/A;  . DIAGNOSTIC LAPAROSCOPY    . DILATION AND CURETTAGE OF UTERUS    . KNEE ARTHROSCOPY WITH MEDIAL MENISECTOMY Left 01/14/2017   Procedure: KNEE ARTHROSCOPY WITH MEDIAL AND LATERAL MENISECTOMY;  Surgeon: Hessie Knows, MD;  Location: [facility] ORS;  Service: Orthopedics;  Laterality: Left;  Partial Knee menisectomy  . KNEE SURGERY Left 05/2011   Dr. Rudene Christians- Arthroscopic  . LUMBAR LAMINECTOMY/DECOMPRESSION MICRODISCECTOMY N/A 09/16/2016   Procedure: LUMBAR LAMINECTOMY/DECOMPRESSION MICRODISCECTOMY 1 LEVEL L5-S1;  Surgeon: Meade Maw, MD;  Location: [facility] ORS;  Service: Neurosurgery;  Laterality: N/A;  . TONSILLECTOMY AND ADENOIDECTOMY    . URETHRAL STRICTURE DILATATION      Family Psychiatric History: I have reviewed family psychiatric history from my progress note on 10/25/2018  Family History:  Family History  Problem Relation Age of Onset  . Cancer Paternal Grandmother   . Anxiety disorder Cousin   . Depression Cousin     Social History: Reviewed social history from my progress note on 10/25/2018 Social History   Socioeconomic History  . Marital status: Married    Spouse name: Jenny Reichmann  . Number of children: 2  . Years of education: 68  . Highest education level: Associate degree: occupational, Hotel manager, or vocational program  Occupational History  . Occupation: not employed  Tobacco Use  . Smoking status: Former Smoker    Packs/day: 0.75    Years: 2.00    Pack years: 1.50    Types: Cigarettes    Start date: 07/06/1981    Quit date: 07/07/1983    Years since quitting: 36.4  . Smokeless tobacco: Never Used  Substance and Sexual Activity  . Alcohol use: No    Alcohol/week: 0.0 standard drinks  . Drug use: No  . Sexual activity: Yes    Partners: Male  Other Topics Concern  . Not on file  Social History Narrative   Married and husband has a lot of medical problems, he is working again    Investment banker, operational of Adult nurse Strain: Medium Risk  . Difficulty of Paying Living Expenses: Somewhat hard  Food Insecurity: No Food Insecurity  . Worried About Charity fundraiser in the Last Year: Never true  . Ran Out of Food in the Last Year: Never true  Transportation Needs: No Transportation Needs  . Lack of Transportation (Medical): No  . Lack of Transportation (Non-Medical): No  Physical Activity: Sufficiently Active  . Days of Exercise per Week: 5 days  . Minutes of Exercise per Session: 30 min  Stress: Stress Concern Present  . Feeling of Stress : To some extent  Social Connections: Not Isolated  . Frequency of Communication with Friends and Family: More than three times a week  . Frequency of Social Gatherings with Friends and Family: More than three times a week  . Attends Religious Services: More than 4 times per year  . Active Member  of Clubs or Organizations: Yes  . Attends Archivist Meetings: More than 4 times per year  . Marital Status: Married    Allergies:  Allergies  Allergen Reactions  . Penicillins Anaphylaxis, Hives, Nausea And Vomiting and Swelling    Has patient had a PCN reaction causing immediate rash, facial/tongue/throat swelling, SOB or lightheadedness with hypotension: Yes Has patient had a PCN reaction causing severe rash involving mucus membranes or skin necrosis: Yes Has patient had a PCN reaction that required hospitalization No Has patient had a PCN reaction occurring within the last 10 years: Yes If all of the above answers are "NO", then may proceed with Cephalosporin use.   . Chlorhexidine Gluconate Itching and Rash  . Ciprofloxacin Hives  . Clindamycin/Lincomycin Hives  . Erythromycin Hives and Nausea And Vomiting  . Keflex [Cephalexin] Hives  . Lyrica [Pregabalin]     Dizziness, syncope  . Nitrofurantoin Monohyd Macro Hives and Nausea And Vomiting  . Other Hives  . Sulfa Antibiotics Hives and Nausea And Vomiting    Rapid heart rate   . Tetracyclines & Related Hives  . Adhesive [Tape] Rash  . Latex Rash  . Vancomycin Itching and Rash    Metabolic Disorder Labs: Lab Results  Component Value Date   HGBA1C 5.3 03/31/2019   MPG 105 03/31/2019   MPG 108 06/17/2018   No results found for: PROLACTIN Lab Results  Component Value Date   CHOL 160 06/17/2018   TRIG 119 06/17/2018   HDL 56 06/17/2018   CHOLHDL 2.9 06/17/2018   VLDL 22 12/31/2016   LDLCALC 82 06/17/2018   LDLCALC 64 12/31/2016   Lab Results  Component Value Date   TSH 1.75 03/31/2019   TSH 2.56 12/31/2016    Therapeutic Level Labs: No results found for: LITHIUM No results found for: VALPROATE No components found for:  CBMZ  Current Medications: Current Outpatient Medications  Medication Sig Dispense Refill  . acetaminophen (TYLENOL) 325 MG tablet Take 650 mg by mouth every 6 (six) hours as needed.    . busPIRone (BUSPAR) 7.5 MG tablet Take 1 tablet (7.5 mg total) by mouth 2 (two) times daily. 60 tablet 1  . Cholecalciferol 125 MCG (5000 UT) capsule Take by mouth.    . fluticasone (FLONASE) 50 MCG/ACT nasal spray Place 2 sprays into both nostrils daily. 16 g 2  . hydrOXYzine (ATARAX/VISTARIL) 25 MG tablet Take 1 tablet (25 mg total) by mouth 2 (two) times daily as needed for anxiety. 60 tablet 1  . loratadine (CLARITIN) 10 MG tablet Take 1 tablet (10 mg total) by mouth daily. 90 tablet 3  . mirtazapine (REMERON) 7.5 MG tablet Take 1 tablet (7.5 mg total) by mouth at bedtime. For sleep and anxiety 90 tablet 0  . montelukast (SINGULAIR) 10 MG tablet Take 1 tablet (10 mg total) by mouth at bedtime. 90 tablet 1  . omeprazole (PRILOSEC) 40 MG capsule Take 1 capsule (40 mg total) by mouth every morning. 90 capsule 1  . prazosin (MINIPRESS) 1 MG capsule Take 1 capsule (1 mg total) by mouth at bedtime. 90 capsule 1  . rOPINIRole (REQUIP) 1 MG tablet Take 1 tablet (1 mg total) by mouth at bedtime. 90 tablet 1  . SUMAtriptan (IMITREX) 100 MG tablet Take  1 tablet (100 mg total) by mouth every 2 (two) hours as needed for migraine. May repeat in 2 hours if headache persists or recurs. 10 tablet 0  . tizanidine (ZANAFLEX) 2 MG capsule Take 1 capsule (  2 mg total) by mouth daily as needed for muscle spasms. 90 capsule 0  . topiramate (TOPAMAX) 100 MG tablet Take 1 tablet (100 mg total) by mouth every evening. 90 tablet 1  . venlafaxine XR (EFFEXOR XR) 75 MG 24 hr capsule Take 1 capsule (75 mg total) by mouth daily with breakfast. To be combined with 150 mg 90 capsule 0  . venlafaxine XR (EFFEXOR-XR) 150 MG 24 hr capsule Take 1 capsule (150 mg total) by mouth daily with breakfast. 90 capsule 1   No current facility-administered medications for this visit.     Musculoskeletal: Strength & Muscle Tone: UTA Gait & Station: Observed as seated Patient leans: N/A  Psychiatric Specialty Exam: Review of Systems  Musculoskeletal:       Rt hip joint pain  Psychiatric/Behavioral: Positive for sleep disturbance. The patient is nervous/anxious.   All other systems reviewed and are negative.   There were no vitals taken for this visit.There is no height or weight on file to calculate BMI.  General Appearance: Casual  Eye Contact:  Fair  Speech:  Clear and Coherent  Volume:  Normal  Mood:  Anxious  Affect:  Congruent  Thought Process:  Goal Directed and Descriptions of Associations: Intact  Orientation:  Full (Time, Place, and Person)  Thought Content: Logical   Suicidal Thoughts:  No  Homicidal Thoughts:  No  Memory:  Immediate;   Fair Recent;   Fair Remote;   Fair  Judgement:  Fair  Insight:  Fair  Psychomotor Activity:  Normal  Concentration:  Concentration: Fair and Attention Span: Fair  Recall:  [employer] of Knowledge: Fair  Language: Fair  Akathisia:  No  Handed:  Right  AIMS (if indicated): UTA  Assets:  Communication Skills Des[employer] for [place] Talents/Skills  ADL's:  Intact  Cognition: WNL  Sleep:   restless due to pain   Screenings: GAD-7     Office Visit from 10/12/2019 in [facility] Office Visit from 06/20/2019 in [facility] Office Visit from 03/23/2019 in [facility] Office Visit from 02/22/2019 in [facility] Office Visit from 09/20/2018 in [facility]  Total GAD-7 Score  15  2  1  1  11     PHQ2-9     Office Visit from 10/12/2019 in [facility] Office Visit from 06/20/2019 in [facility] Office Visit from 03/23/2019 in [facility] Office Visit from 02/22/2019 in [facility] Office Visit from 01/12/2019 in [facility]  PHQ-2 Total Score  6  1  0  1  2  PHQ-9 Total Score  20  6  2  4  7        Assessment and Plan: NESHA COUNIHAN is a 53 panic attacks, insomnia, arthritis, chronic pain was evaluated by telemedicine today.  Patient is biologically predisposed given her family history, history of trauma, multiple medical problems.  Patient with psychosocial stressors of physical limitation, financial problems and pain.  Patient currently struggles with anxiety as well as sleep more so because of her pain.  She will benefit from continued medication management and psychotherapy sessions.  Plan as noted below.  Plan MDD-some progress Venlafaxine 225 mg p.o. daily Remeron 7.5 mg p.o. nightly  GAD-unstable Venlafaxine 225 mg p.o. daily Remeron 7.5 mg p.o. nightly Add BuSpar 7.5 mg p.o. twice daily Continue CBT  Panic disorder-unstable Add BuSpar 7.5 mg  p.o. twice daily Venlafaxine as prescribed Hydroxyzine 25 mg p.o. twice daily as needed Continue CBT  Insomnia-restless due to pain Prazosin 1 mg p.o. nightly for nightmares Remeron 7.5 mg p.o. nightly for sleep Discussed with patient to reach out to pain provider for pain management.  Follow-up in clinic in 4 to 6 weeks or sooner if  needed.  I have spent atleast 20 minutes non face to face with patient today. More than 50 % of the time was spent for preparing to see the patient ( e.g., review of test, records ), ordering medications and test ,psychoeducation and supportive psychotherapy and care coordination,as well as documenting clinical information in electronic health record. This note was generated in part or whole with voice recognition software. Voice recognition is usually quite accurate but there are transcription errors that can and very often do occur. I apologize for any typographical errors that were not detected and corrected.       Ursula Alert, MD 12/08/2019, 12:24 PM

## 2019-12-14 ENCOUNTER — Encounter: Payer: Self-pay | Admitting: Student in an Organized Health Care Education/Training Program

## 2019-12-14 ENCOUNTER — Other Ambulatory Visit: Payer: Self-pay

## 2019-12-14 ENCOUNTER — Ambulatory Visit
Payer: BC Managed Care – PPO | Attending: Student in an Organized Health Care Education/Training Program | Admitting: Student in an Organized Health Care Education/Training Program

## 2019-12-14 VITALS — BP 127/85 | HR 114 | Temp 97.4°F | Resp 18 | Ht 64.0 in | Wt 210.0 lb

## 2019-12-14 DIAGNOSIS — M47816 Spondylosis without myelopathy or radiculopathy, lumbar region: Secondary | ICD-10-CM

## 2019-12-14 DIAGNOSIS — G894 Chronic pain syndrome: Secondary | ICD-10-CM

## 2019-12-14 DIAGNOSIS — M4317 Spondylolisthesis, lumbosacral region: Secondary | ICD-10-CM

## 2019-12-14 MED ORDER — GABAPENTIN 300 MG PO CAPS: ORAL_CAPSULE | ORAL | 0 refills | Status: DC

## 2019-12-14 NOTE — Patient Instructions (Signed)
A prescription for Gabapentin has been sent to your pharmacy. ____________________________________________________________________________________________  Preparing for Procedure with Sedation  Procedure appointments are limited to planned procedures:  No Prescription Refills.  No disability issues will be discussed.  No medication changes will be discussed.  Instructions:  Oral Intake: Do not eat or drink anything for at least 8 hours prior to your procedure. (Exception: Blood Pressure Medication. See below.)  Transportation: Unless otherwise stated by your physician, you may drive yourself after the procedure.  Blood Pressure Medicine: Do not forget to take your blood pressure medicine with a sip of water the morning of the procedure. If your Diastolic (lower reading)is above 100 mmHg, elective cases will be cancelled/rescheduled.  Blood thinners: These will need to be stopped for procedures. Notify our staff if you are taking any blood thinners. Depending on which one you take, there will be specific instructions on how and when to stop it.  Diabetics on insulin: Notify the staff so that you can be scheduled 1st case in the morning. If your diabetes requires high dose insulin, take only  of your normal insulin dose the morning of the procedure and notify the staff that you have done so.  Preventing infections: Shower with an antibacterial soap the morning of your procedure.  Build-up your immune system: Take 1000 mg of Vitamin C with every meal (3 times a day) the day prior to your procedure.  Antibiotics: Inform the staff if you have a condition or reason that requires you to take antibiotics before dental procedures.  Pregnancy: If you are pregnant, call and cancel the procedure.  Sickness: If you have a cold, fever, or any active infections, call and cancel the procedure.  Arrival: You must be in the facility at least 30 minutes prior to your scheduled  procedure.  Children: Do not bring children with you.  Dress appropriately: Bring dark clothing that you would not mind if they get stained.  Valuables: Do not bring any jewelry or valuables.  Reasons to call and reschedule or cancel your procedure: (Following these recommendations will minimize the risk of a serious complication.)  Surgeries: Avoid having procedures within 2 weeks of any surgery. (Avoid for 2 weeks before or after any surgery).  Flu Shots: Avoid having procedures within 2 weeks of a flu shots or . (Avoid for 2 weeks before or after immunizations).  Barium: Avoid having a procedure within 7-10 days after having had a radiological study involving the use of radiological contrast. (Myelograms, Barium swallow or enema study).  Heart attacks: Avoid any elective procedures or surgeries for the initial 6 months after a "Myocardial Infarction" (Heart Attack).  Blood thinners: It is imperative that you stop these medications before procedures. Let us know if you if you take any blood thinner.   Infection: Avoid procedures during or within two weeks of an infection (including chest colds or gastrointestinal problems). Symptoms associated with infections include: Localized redness, fever, chills, night sweats or profuse sweating, burning sensation when voiding, cough, congestion, stuffiness, runny nose, sore throat, diarrhea, nausea, vomiting, cold or Flu symptoms, recent or current infections. It is specially important if the infection is over the area that we intend to treat.  Heart and lung problems: Symptoms that may suggest an active cardiopulmonary problem include: cough, chest pain, breathing difficulties or shortness of breath, dizziness, ankle swelling, uncontrolled high or unusually low blood pressure, and/or palpitations. If you are experiencing any of these symptoms, cancel your procedure and contact your primary care physician  for an evaluation.  Remember:  Regular  Business hours are:  Monday to Thursday 8:00 AM to 4:00 PM  Provider's Schedule: Milinda Pointer, MD:  Procedure days: Tuesday and Thursday 7:30 AM to 4:00 PM  Gillis Santa, MD:  Procedure days: Monday and Wednesday 7:30 AM to 4:00 PM ____________________________________________________________________________________________

## 2019-12-14 NOTE — Progress Notes (Signed)
Safety precautions to be maintained throughout the outpatient stay will include: orient to surroundings, keep bed in low position, maintain call bell within reach at all times, provide assistance with transfer out of bed and ambulation.  

## 2019-12-14 NOTE — Progress Notes (Signed)
PROVIDER NOTE: Information contained herein reflects review and annotations entered in association with encounter. Interpretation of such information and data should be left to medically-trained personnel. Information provided to patient can be located elsewhere in the medical record under "Patient Instructions". Document created using STT-dictation technology, any transcriptional errors that may result from process are unintentional.    Patient: Kelly [name]  Service Category: E/M  Provider: Gillis Santa, MD  DOB: February 15, 1966  DOS: 12/14/2019  Specialty: Interventional Pain Management  MRN: [id number]  Setting: Ambulatory outpatient  PCP: Steele Sizer, MD  Type: Established Patient    Referring Provider: Steele Sizer, MD  Location: Office  Delivery: Face-to-face     HPI  Reason for encounter: Kelly [name], a 54 y.o. year old female, is here today for evaluation and management of her Lumbar spondylosis [M47.816]. Ms. Madewell primary complain today is Back Pain (bilateral) Last encounter: Practice (Visit date not found). My last encounter with her was on Visit date not found. Pertinent problems: Ms. Josephs has MDD (major depressive disorder), recurrent episode, moderate ([place]); Obesity (BMI 30-39.9); Restless leg; Allergic rhinitis, seasonal; Spondylolisthesis of lumbosacral region; GAD (generalized anxiety disorder); Lumbar spondylosis; and Chronic pain syndrome on their pertinent problem list. Pain Assessment: Severity of Chronic pain is reported as a 9 /10. Location: Back Lower/both hips and both legs to the feet. Onset: More than a month ago. Quality: Constant, Aching, Throbbing. Timing: Constant. Modifying factor(s): Aleve. Vitals:  height is _0  (1.626 m) and weight is 210 lb (95.3 kg). Her temporal temperature is 97.4 F (36.3 C) (abnormal). Her blood pressure is 127/85 and her pulse is 114 (abnormal). Her respiration is 18 and oxygen saturation is 98%.   Ms. Posey Pronto  follows up today with a chief complaint of persistent low back pain as well as bilateral hip pain.  She states that her left hip pain is bothering her.  She also has right hip pain.  She also complains of persistent restless leg syndrome that has not been very responsive to medications.  She is status post 2 diagnostic lumbar facet medial branch nerve blocks at L2, L3, L4 above her lumbar laminectomy at L5-S1.  She received significant pain relief from these blocks, greater than 75% for more than 2 months.  She also endorses improvement in functional status as a result of these blocks.  It is unfortunate that her insurance company will not approve the radiofrequency ablation for the purpose of obtaining long-term pain relief.  In the interim, we discussed repeating lumbar facet medial branch nerve blocks at the previously done levels at L2, L3, L4.  Depending upon the interim of her pain relief, can follow through with insurance again to see if they will approve lumbar radiofrequency ablation.  If not can consider sprint peripheral nerve PNS.  Patient could also be a candidate for spinal cord stimulator trial.  Could evaluate interlaminar windows during next procedure.  ROS  Constitutional: Denies any fever or chills Gastrointestinal: No reported hemesis, hematochezia, vomiting, or acute GI distress Musculoskeletal: Denies any acute onset joint swelling, redness, loss of ROM, or weakness +low back pain Neurological: No reported episodes of acute onset apraxia, aphasia, dysarthria, agnosia, amnesia, paralysis, loss of coordination, or loss of consciousness  Medication Review  Cholecalciferol, SUMAtriptan, busPIRone, fluticasone, gabapentin, hydrOXYzine, loratadine, mirtazapine, montelukast, omeprazole, prazosin, rOPINIRole, tizanidine, topiramate, and venlafaxine XR  History Review  Allergy: Ms. Gunnerson is allergic to penicillins, chlorhexidine gluconate, ciprofloxacin, clindamycin/lincomycin,  erythromycin, keflex [cephalexin], lyrica [pregabalin], nitrofurantoin monohyd  macro, other, sulfa antibiotics, tetracyclines & related, adhesive [tape], latex, and vancomycin. Drug: Ms. Esters  reports no history of drug use. Alcohol:  reports no history of alcohol use. Tobacco:  reports that she quit smoking about 36 years ago. Her smoking use included cigarettes. She started smoking about 38 years ago. She has a 1.50 pack-year smoking history. She has never used smokeless tobacco. Social: Ms. Jacobson  reports that she quit smoking about 36 years ago. Her smoking use included cigarettes. She started smoking about 38 years ago. She has a 1.50 pack-year smoking history. She has never used smokeless tobacco. She reports that she does not drink alcohol and does not use drugs. Medical:  has a past medical history of Allergy, Anxiety, Depression, GERD (gastroesophageal reflux disease), History of kidney stones, Insomnia, Intermittent low back pain, Migraines, Osteoarthritis, Recurrent UTI, Restless leg syndrome, Sciatica of right side, Symptomatic menopausal or female climacteric states, Vertigo, and Vitamin D deficiency. Surgical: Ms. Satterfield  has a past surgical history that includes Tonsillectomy and adenoidectomy; Urethra dilation; Knee surgery (Left, 05/2011); Lumbar laminectomy/decompression microdiscectomy (N/A, 09/16/2016); Back surgery; Diagnostic laparoscopy; Dilation and curettage of uterus; Knee arthroscopy with medial menisectomy (Left, 01/14/2017); Colonoscopy with propofol (N/A, 02/03/2019); and Colonoscopy with propofol (N/A, 03/10/2019). Family: family history includes Anxiety disorder in her cousin; Cancer in her paternal grandmother; Depression in her cousin.  Laboratory Chemistry Profile   Renal Lab Results  Component Value Date   BUN 16 03/31/2019   CREATININE 0.79 51/08/5850   BCR NOT APPLICABLE 77/82/4235   GFRAA 99 03/31/2019   GFRNONAA 85 03/31/2019     Hepatic Lab Results   Component Value Date   AST 16 03/31/2019   ALT 13 03/31/2019   ALBUMIN 4.4 01/04/2017   ALKPHOS 69 01/04/2017   LIPASE 26 01/04/2017     Electrolytes Lab Results  Component Value Date   NA 137 03/31/2019   K 4.4 03/31/2019   CL 105 03/31/2019   CALCIUM 9.2 03/31/2019     Bone Lab Results  Component Value Date   VD25OH 37 03/31/2019     Inflammation (CRP: Acute Phase) (ESR: Chronic Phase) No results found for: CRP, ESRSEDRATE, LATICACIDVEN     Note: Above Lab results reviewed.  Recent Imaging Review  DG Shoulder Left CLINICAL DATA:  Left shoulder pain.  EXAM: LEFT SHOULDER - 2+ VIEW  COMPARISON:  None.  FINDINGS: There is no evidence of fracture or dislocation. Mild degenerative changes seen involving the left acromioclavicular joint. Soft tissues are unremarkable.  IMPRESSION: Mild degenerative joint disease of left acromioclavicular joint. No acute abnormality seen in the left shoulder.  Electronically Signed   By: Marijo Conception, M.D.   On: 08/30/2018 17:29 Note: Reviewed        Physical Exam  General appearance: Well nourished, well developed, and well hydrated. In no apparent acute distress Mental status: Alert, oriented x 3 (person, place, & time)       Respiratory: No evidence of acute respiratory distress Eyes: PERLA Vitals: BP 127/85   Pulse (!) 114   Temp (!) 97.4 F (36.3 C) (Temporal)   Resp 18   Ht '5\' 4"'$  (1.626 m)   Wt 210 lb (95.3 kg)   SpO2 98%   BMI 36.05 kg/m  BMI: Estimated body mass index is 36.05 kg/m as calculated from the following:   Height as of this encounter: '5\' 4"'$  (1.626 m).   Weight as of this encounter: 210 lb (95.3 kg). Ideal: Ideal body weight:  54.7 kg (120 lb 9.5 oz) Adjusted ideal body weight: 70.9 kg (156 lb 5.7 oz)  Lumbar Spine Area Exam  Skin & Axial Inspection: Well healed scar from previous spine surgery detected Alignment: Symmetrical Functional ROM: Pain restricted ROM       Stability: No  instability detected Muscle Tone/Strength: Functionally intact. No obvious neuro-muscular anomalies detected. Sensory (Neurological): Dermatomal pain pattern Palpation: Complains of area being tender to palpation       Provocative Tests: Hyperextension/rotation test: (+) bilaterally for facet joint pain. Lumbar quadrant test (Kemp's test): (+) bilaterally for facet joint pain. Lateral bending test: (+) ipsilateral radicular pain, bilaterally. Positive for bilateral foraminal stenosis. Patrick's Maneuver: deferred today                   FABER* test: deferred today                   S-I anterior distraction/compression test: deferred today         S-I lateral compression test: deferred today         S-I Thigh-thrust test: deferred today         S-I Gaenslen's test: deferred today         *(Flexion, ABduction and External Rotation) Gait & Posture Assessment  Ambulation: Unassisted Gait: Relatively normal for age and body habitus Posture: WNL  Lower Extremity Exam    Side: Right lower extremity  Side: Left lower extremity  Stability: No instability observed          Stability: No instability observed          Skin & Extremity Inspection: Skin color, temperature, and hair growth are WNL. No peripheral edema or cyanosis. No masses, redness, swelling, asymmetry, or associated skin lesions. No contractures.  Skin & Extremity Inspection: Skin color, temperature, and hair growth are WNL. No peripheral edema or cyanosis. No masses, redness, swelling, asymmetry, or associated skin lesions. No contractures.  Functional ROM: Pain restricted ROM for hip and knee joints          Functional ROM: Pain restricted ROM for hip and knee joints          Muscle Tone/Strength: Functionally intact. No obvious neuro-muscular anomalies detected.  Muscle Tone/Strength: Functionally intact. No obvious neuro-muscular anomalies detected.  Sensory (Neurological): Neurogenic pain pattern        Sensory (Neurological):  Neurogenic pain pattern        DTR: Patellar: deferred today Achilles: deferred today Plantar: deferred today  DTR: Patellar: deferred today Achilles: deferred today Plantar: deferred today  Palpation: No palpable anomalies  Palpation: No palpable anomalies    Assessment   Status Diagnosis  Having a Flare-up Having a Flare-up Persistent 1. Lumbar spondylosis   2. Spondylolisthesis of lumbosacral region   3. Chronic pain syndrome      Updated Problems: Problem  Chronic Pain Syndrome  Lumbar Spondylosis  Gad (Generalized Anxiety Disorder)  Spondylolisthesis of Lumbosacral Region  Mdd (Major Depressive Disorder), Recurrent Episode, Moderate ([facility])  Obesity (Bmi 30-39.9)  Restless Leg  Allergic Rhinitis, Seasonal    Plan of Care  [name] has a current medication list which includes the following long-term medication(s): fluticasone, loratadine, mirtazapine, montelukast, omeprazole, prazosin, ropinirole, sumatriptan, topiramate, venlafaxine xr, venlafaxine xr, and gabapentin.  Pharmacotherapy (Medications Ordered): Meds ordered this encounter  Medications  . gabapentin (NEURONTIN) 300 MG capsule    Sig: Take 1 capsule (300 mg total) by mouth at bedtime for 30 days, THEN 1 capsule (  300 mg total) 2 (two) times daily.    Dispense:  90 capsule    Refill:  0   Orders:  Orders Placed This Encounter  Procedures  . LUMBAR FACET(MEDIAL BRANCH NERVE BLOCK) MBNB    Standing Status:   Future    Standing Expiration Date:   01/13/2020    Scheduling Instructions:     Procedure: Lumbar facet block (AKA.: Lumbosacral medial branch nerve block)     Side: Bilateral     Level: L3-4, L4-5, Facets (L2, L3, L4,  Medial Branch Nerves)     Sedation: Patient's choice.     Timeframe: ASAA    Order Specific Question:   Where will this procedure be performed?    Answer:   [facility]   Follow-up plan:   Return in about 1 week (around 12/21/2019) for L2-4 Fcts w  sedation.   Recent Visits No visits were found meeting these conditions. Showing recent visits within past 90 days and meeting all other requirements Today's Visits Date Type Provider Dept  12/14/19 Office Visit Gillis Santa, MD [facility]  Showing today's visits and meeting all other requirements Future Appointments Date Type Provider Dept  12/20/19 Appointment Gillis Santa, MD [facility]  02/08/20 Appointment Gillis Santa, MD [facility]  Showing future appointments within next 90 days and meeting all other requirements  I discussed the assessment and treatment plan with the patient. The patient was provided an opportunity to ask questions and all were answered. The patient agreed with the plan and demonstrated an understanding of the instructions.  Patient advised to call back or seek an in-person evaluation if the symptoms or condition worsens.  Duration of encounter: 30 minutes.  Note by: Gillis Santa, MD Date: 12/14/2019; Time: 9:21 AM

## 2019-12-20 ENCOUNTER — Encounter: Payer: Self-pay | Admitting: Student in an Organized Health Care Education/Training Program

## 2019-12-20 ENCOUNTER — Other Ambulatory Visit: Payer: Self-pay

## 2019-12-20 ENCOUNTER — Ambulatory Visit (HOSPITAL_BASED_OUTPATIENT_CLINIC_OR_DEPARTMENT_OTHER): Payer: BC Managed Care – PPO | Admitting: Student in an Organized Health Care Education/Training Program

## 2019-12-20 ENCOUNTER — Ambulatory Visit
Admission: RE | Admit: 2019-12-20 | Discharge: 2019-12-20 | Disposition: A | Discharge status: Home / Self Care | Payer: BC Managed Care – PPO | Source: Ambulatory Visit | Attending: Student in an Organized Health Care Education/Training Program | Admitting: Student in an Organized Health Care Education/Training Program

## 2019-12-20 VITALS — BP 115/59 | HR 83 | Temp 97.2°F | Resp 14 | Ht 64.0 in | Wt 212.0 lb

## 2019-12-20 DIAGNOSIS — G894 Chronic pain syndrome: Secondary | ICD-10-CM | POA: Insufficient documentation

## 2019-12-20 DIAGNOSIS — M47816 Spondylosis without myelopathy or radiculopathy, lumbar region: Secondary | ICD-10-CM

## 2019-12-20 DIAGNOSIS — M4317 Spondylolisthesis, lumbosacral region: Secondary | ICD-10-CM | POA: Diagnosis not present

## 2019-12-20 MED ORDER — DEXAMETHASONE SODIUM PHOSPHATE 10 MG/ML IJ SOLN
INTRAMUSCULAR | Status: AC
  Filled 2019-12-20: qty 2

## 2019-12-20 MED ORDER — LIDOCAINE HCL 2 % IJ SOLN
20.0000 mL | Freq: Once | INTRAMUSCULAR | Status: AC
  Administered 2019-12-20: 400 mg

## 2019-12-20 MED ORDER — DEXAMETHASONE SODIUM PHOSPHATE 10 MG/ML IJ SOLN
10.0000 mg | Freq: Once | INTRAMUSCULAR | Status: AC
  Administered 2019-12-20: 10 mg

## 2019-12-20 MED ORDER — ROPIVACAINE HCL 2 MG/ML IJ SOLN
9.0000 mL | Freq: Once | INTRAMUSCULAR | Status: AC
  Administered 2019-12-20: 9 mL via PERINEURAL

## 2019-12-20 MED ORDER — FENTANYL CITRATE (PF) 100 MCG/2ML IJ SOLN
25.0000 ug | INTRAMUSCULAR | Status: AC | PRN
  Administered 2019-12-20 (×2): 50 ug via INTRAVENOUS

## 2019-12-20 MED ORDER — FENTANYL CITRATE (PF) 100 MCG/2ML IJ SOLN
INTRAMUSCULAR | Status: AC
  Filled 2019-12-20: qty 2

## 2019-12-20 MED ORDER — LIDOCAINE HCL 2 % IJ SOLN
INTRAMUSCULAR | Status: AC
  Filled 2019-12-20: qty 20

## 2019-12-20 MED ORDER — ROPIVACAINE HCL 2 MG/ML IJ SOLN
INTRAMUSCULAR | Status: AC
  Filled 2019-12-20: qty 20

## 2019-12-20 NOTE — Progress Notes (Signed)
Patient's Name: Kelly [name]  MRN: [id number]  Referring Provider: Steele Sizer, MD  DOB: 12/01/1965  PCP: Steele Sizer, MD  DOS: 12/20/2019  Note by: Gillis Santa, MD  Service setting: Ambulatory outpatient  Specialty: Interventional Pain Management  Patient type: Established  Location: [facility] (AMB) Pain Management Facility  Visit type: Interventional Procedure   Primary Reason for Visit: Interventional Pain Management Treatment. CC: Back Pain (low bilateral)  Procedure:  Anesthesia, Analgesia, Anxiolysis:  Type: Therapeutic Medial Branch Facet Block with steroid #3 (#2 done 07/22/2017) Region: Lumbar Level: L2/3, L3/4, L4/5 Laterality: Bilateral  Type: Local Anesthesia with Moderate (Conscious) Sedation Local Anesthetic: Lidocaine 1% Route: Intravenous (IV) IV Access: Secured Sedation: Meaningful verbal contact was maintained at all times during the procedure  Indication(s): Analgesia and Anxiety   Indications: 1. Lumbar spondylosis   2. Spondylolisthesis of lumbosacral region   3. Chronic pain syndrome    Pain Score: Pre-procedure: 9 /10 Post-procedure: 0-No pain/10  Pre-op Assessment:  Kelly [name] is a 54 y.o. (year old), female patient, seen today for interventional treatment. She  has a past surgical history that includes Tonsillectomy and adenoidectomy; Urethra dilation; Knee surgery (Left, 05/2011); Lumbar laminectomy/decompression microdiscectomy (N/A, 09/16/2016); Back surgery; Diagnostic laparoscopy; Dilation and curettage of uterus; Knee arthroscopy with medial menisectomy (Left, 01/14/2017); Colonoscopy with propofol (N/A, 02/03/2019); and Colonoscopy with propofol (N/A, 03/10/2019). Kelly [name] has a current medication list which includes the following prescription(s): buspirone, cholecalciferol, fluticasone, gabapentin, hydroxyzine, loratadine, mirtazapine, montelukast, omeprazole, prazosin, ropinirole, sumatriptan, tizanidine, topiramate, venlafaxine xr, and  venlafaxine xr. Her primarily concern today is the Back Pain (low bilateral)  Initial Vital Signs: There were no vitals taken for this visit. BMI: Estimated body mass index is 36.39 kg/m as calculated from the following:   Height as of this encounter: 5\' 4"  (1.626 m).   Weight as of this encounter: 212 lb (96.2 kg).  Risk Assessment: Allergies: Reviewed. She is allergic to penicillins, chlorhexidine gluconate, ciprofloxacin, clindamycin/lincomycin, erythromycin, keflex [cephalexin], lyrica [pregabalin], nitrofurantoin monohyd macro, other, sulfa antibiotics, tetracyclines & related, adhesive [tape], latex, and vancomycin.  Allergy Precautions: None required Coagulopathies: Reviewed. None identified.  Blood-thinner therapy: None at this time Active Infection(s): Reviewed. None identified. Kelly [name] is afebrile  Site Confirmation: Kelly [name] was asked to confirm the procedure and laterality before marking the site Procedure checklist: Completed Consent: Before the procedure and under the influence of no sedative(s), amnesic(s), or anxiolytics, the patient was informed of the treatment options, risks and possible complications. To fulfill our ethical and legal obligations, as recommended by the American Medical Association's Code of Ethics, I have informed the patient of my clinical impression; the nature and purpose of the treatment or procedure; the risks, benefits, and possible complications of the intervention; the alternatives, including doing nothing; the risk(s) and benefit(s) of the alternative treatment(s) or procedure(s); and the risk(s) and benefit(s) of doing nothing. The patient was provided information about the general risks and possible complications associated with the procedure. These may include, but are not limited to: failure to achieve desired goals, infection, bleeding, organ or nerve damage, allergic reactions, paralysis, and death. In addition, the patient was informed  of those risks and complications associated to Spine-related procedures, such as failure to decrease pain; infection (i.e.: Meningitis, epidural or intraspinal abscess); bleeding (i.e.: epidural hematoma, subarachnoid hemorrhage, or any other type of intraspinal or peri-dural bleeding); organ or nerve damage (i.e.: Any type of peripheral nerve, nerve root, or spinal cord injury) with subsequent damage to sensory, motor, and/or  autonomic systems, resulting in permanent pain, numbness, and/or weakness of one or several areas of the body; allergic reactions; (i.e.: anaphylactic reaction); and/or death. Furthermore, the patient was informed of those risks and complications associated with the medications. These include, but are not limited to: allergic reactions (i.e.: anaphylactic or anaphylactoid reaction(s)); adrenal axis suppression; blood sugar elevation that in diabetics may result in ketoacidosis or comma; water retention that in patients with history of congestive heart failure may result in shortness of breath, pulmonary edema, and decompensation with resultant heart failure; weight gain; swelling or edema; medication-induced neural toxicity; particulate matter embolism and blood vessel occlusion with resultant organ, and/or nervous system infarction; and/or aseptic necrosis of one or more joints. Finally, the patient was informed that Medicine is not an exact science; therefore, there is also the possibility of unforeseen or unpredictable risks and/or possible complications that may result in a catastrophic outcome. The patient indicated having understood very clearly. We have given the patient no guarantees and we have made no promises. Enough time was given to the patient to ask questions, all of which were answered to the patient's satisfaction. Kelly [name] has indicated that she wanted to continue with the procedure. Attestation: I, the ordering provider, attest that I have discussed with the patient  the benefits, risks, side-effects, alternatives, likelihood of achieving goals, and potential problems during recovery for the procedure that I have provided informed consent. Date: 12/20/2019; Time: 11:02 AM  Pre-Procedure Preparation:  Monitoring: As per clinic protocol. Respiration, ETCO2, SpO2, BP, heart rate and rhythm monitor placed and checked for adequate function Safety Precautions: Patient was assessed for positional comfort and pressure points before starting the procedure. Time-out: I initiated and conducted the "Time-out" before starting the procedure, as per protocol. The patient was asked to participate by confirming the accuracy of the "Time Out" information. Verification of the correct person, site, and procedure were performed and confirmed by me, the nursing staff, and the patient. "Time-out" conducted as per Joint Commission's Universal Protocol (UP.01.01.01). "Time-out" Date & Time: 12/20/2019; 1204 hrs.  Description of Procedure Process:   Position: Prone Target Area: For Lumbar Facet blocks, the target is the groove formed by the junction of the transverse process and superior articular process. Approach: Paramedial approach. Area Prepped: Entire Posterior Lumbosacral Region Prepping solution: ChloraPrep (2% chlorhexidine gluconate and 70% isopropyl alcohol) Safety Precautions: Aspiration looking for blood return was conducted prior to all injections. At no point did we inject any substances, as a needle was being advanced. No attempts were made at seeking any paresthesias. Safe injection practices and needle disposal techniques used. Medications properly checked for expiration dates. SDV (single dose vial) medications used. Description of the Procedure: Protocol guidelines were followed. The patient was placed in position over the fluoroscopy table. The target area was identified and the area prepped in the usual manner. Skin desensitized using vapocoolant spray. Skin & deeper  tissues infiltrated with local anesthetic. Appropriate amount of time allowed to pass for local anesthetics to take effect. The procedure needle was introduced through the skin, ipsilateral to the reported pain, and advanced to the target area. Employing the "Medial Branch Technique", the needles were advanced to the angle made by the superior and medial portion of the transverse process, and the lateral and inferior portion of the superior articulating process of the targeted vertebral bodies. This area is known as "Burton's Eye" or the "Eye of the [place] Dog".  Negative aspiration confirmed. Solution injected in intermittent fashion, asking for systemic  symptoms every 0.5cc of injectate. The needles were then removed and the area cleansed, making sure to leave some of the prepping solution back to take advantage of its long term bactericidal properties.   Illustration of the posterior view of the lumbar spine and the posterior neural structures. Laminae of L2 through S1 are labeled. DPRL5, dorsal primary ramus of L5; DPRS1, dorsal primary ramus of S1; DPR3, dorsal primary ramus of L3; FJ, facet (zygapophyseal) joint L3-L4; I, inferior articular process of L4; LB1, lateral branch of dorsal primary ramus of L1; IAB, inferior articular branches from L3 medial branch (supplies L4-L5 facet joint); IBP, intermediate branch plexus; MB3, medial branch of dorsal primary ramus of L3; NR3, third lumbar nerve root; S, superior articular process of L5; SAB, superior articular branches from L4 (supplies L4-5 facet joint also); TP3, transverse process of L3.  Vitals:   12/20/19 1220 12/20/19 1230 12/20/19 1240 12/20/19 1250  BP: 124/76 112/61 120/63 (!) 115/59  Pulse:      Resp: 16 14 15 14   Temp:  (!) 97.5 F (36.4 C)  (!) 97.2 F (36.2 C)  SpO2: 93% 100% 100% 100%  Weight:      Height:        Start Time: 1204 hrs. End Time: 1217 hrs. Materials:  Needle(s) Type: Regular needle Gauge: 22G Length:  3.5-in Medication(s): We administered lidocaine, fentaNYL, ropivacaine (PF) 2 mg/mL (0.2%), dexamethasone, and dexamethasone. Please see chart orders for dosing details. 6 cc solution made a 4 cc of 0.2% ropivacaine, 2 cc of Decadron (10 mg/cc).  1 cc injected at each level bilaterally.  Imaging Guidance (Spinal):  Type of Imaging Technique: Fluoroscopy Guidance (Spinal) Indication(s): Assistance in needle guidance and placement for procedures requiring needle placement in or near specific anatomical locations not easily accessible without such assistance. Exposure Time: Please see nurses notes. Contrast: None used. Fluoroscopic Guidance: I was personally present during the use of fluoroscopy. "Tunnel Vision Technique" used to obtain the best possible view of the target area. Parallax error corrected before commencing the procedure. "Direction-depth-direction" technique used to introduce the needle under continuous pulsed fluoroscopy. Once target was reached, antero-posterior, oblique, and lateral fluoroscopic projection used confirm needle placement in all planes. Images permanently stored in EMR. Interpretation: No contrast injected. I personally interpreted the imaging intraoperatively. Adequate needle placement confirmed in multiple planes. Permanent images saved into the patient's record.  Antibiotic Prophylaxis:  Indication(s): None identified Antibiotic given: None  Post-operative Assessment:  EBL: None Complications: No immediate post-treatment complications observed by team, or reported by patient. Note: The patient tolerated the entire procedure well. A repeat set of vitals were taken after the procedure and the patient was kept under observation following institutional policy, for this type of procedure. Post-procedural neurological assessment was performed, showing return to baseline, prior to discharge. The patient was provided with post-procedure discharge instructions, including a  section on how to identify potential problems. Should any problems arise concerning this procedure, the patient was given instructions to immediately contact us, at any time, without hesitation. In any case, we plan to contact the patient by telephone for a follow-up status report regarding this interventional procedure. Comments:  No additional relevant information. 5 out of 5 strength bilateral lower extremity: Plantar flexion, dorsiflexion, knee flexion, knee extension.  Plan of Care    Imaging Orders     DG PAIN CLINIC C-ARM 1-60 MIN NO REPORT Procedure Orders    No procedure(s) ordered today    Medications ordered for procedure: Meds  ordered this encounter  Medications  . lidocaine (XYLOCAINE) 2 % (with pres) injection 400 mg  . fentaNYL (SUBLIMAZE) injection 25-50 mcg    Make sure Narcan is available in the pyxis when using this medication. In the event of respiratory depression (RR< 8/min): Titrate NARCAN (naloxone) in increments of 0.1 to 0.2 mg IV at 2-3 minute intervals, until desired degree of reversal.  . ropivacaine (PF) 2 mg/mL (0.2%) (NAROPIN) injection 9 mL  . dexamethasone (DECADRON) injection 10 mg  . dexamethasone (DECADRON) injection 10 mg   Medications administered: We administered lidocaine, fentaNYL, ropivacaine (PF) 2 mg/mL (0.2%), dexamethasone, and dexamethasone.  See the medical record for exact dosing, route, and time of administration.  New Prescriptions   No medications on file   Disposition: Discharge home  Discharge Date & Time: 12/20/2019; 1251 hrs.   Physician-requested Follow-up: Return in about 5 weeks (around 01/24/2020) for Post Procedure Evaluation, virtual. Future Appointments  Date Time Provider [place]  01/29/2020  9:40 AM Ursula Alert, MD [facility] None  01/29/2020  2:00 PM Gillis Santa, MD [facility] None  02/08/2020 10:30 AM Gillis Santa, MD [facility] None  04/12/2020 10:20 AM Steele Sizer, MD [facility]   Primary  Care Physician: Steele Sizer, MD Location: [facility] Note by: Gillis Santa, MD Date: 12/20/2019; Time: 1:36 PM  Disclaimer:  Medicine is not an Chief Strategy Officer. The only guarantee in medicine is that nothing is guaranteed. It is important to note that the decision to proceed with this intervention was based on the information collected from the patient. The Data and conclusions were drawn from the patient's questionnaire, the interview, and the physical examination. Because the information was provided in large part by the patient, it cannot be guaranteed that it has not been purposely or unconsciously manipulated. Every effort has been made to obtain as much relevant data as possible for this evaluation. It is important to note that the conclusions that lead to this procedure are derived in large part from the available data. Always take into account that the treatment will also be dependent on availability of resources and existing treatment guidelines, considered by other Pain Management Practitioners as being common knowledge and practice, at the time of the intervention. For Medico-Legal purposes, it is also important to point out that variation in procedural techniques and pharmacological choices are the acceptable norm. The indications, contraindications, technique, and results of the above procedure should only be interpreted and judged by a Board-Certified Interventional Pain Specialist with extensive familiarity and expertise in the same exact procedure and technique.

## 2019-12-20 NOTE — Progress Notes (Signed)
Safety precautions to be maintained throughout the outpatient stay will include: orient to surroundings, keep bed in low position, maintain call bell within reach at all times, provide assistance with transfer out of bed and ambulation.  

## 2019-12-20 NOTE — Patient Instructions (Addendum)
____________________________________________________________________________________________  Post-Procedure Discharge Instructions  Instructions: Apply ice:  Purpose: This will minimize any swelling and discomfort after procedure.  When: Day of procedure, as soon as you get home. How: Fill a plastic sandwich bag with crushed ice. Cover it with a small towel and apply to injection site. How long: (15 min on, 15 min off) Apply for 15 minutes then remove x 15 minutes.  Repeat sequence on day of procedure, until you go to bed. Apply heat:  Purpose: To treat any soreness and discomfort from the procedure. When: Starting the next day after the procedure. How: Apply heat to procedure site starting the day following the procedure. How long: May continue to repeat daily, until discomfort goes away. Food intake: Start with clear liquids (like water) and advance to regular food, as tolerated.  Physical activities: Keep activities to a minimum for the first 8 hours after the procedure. After that, then as tolerated. Driving: If you have received any sedation, be responsible and do not drive. You are not allowed to drive for 24 hours after having sedation. Blood thinner: (Applies only to those taking blood thinners) You may restart your blood thinner 6 hours after your procedure. Insulin: (Applies only to Diabetic patients taking insulin) As soon as you can eat, you may resume your normal dosing schedule. Infection prevention: Keep procedure site clean and dry. Shower daily and clean area with soap and water. Post-procedure Pain Diary: Extremely important that this be done correctly and accurately. Recorded information will be used to determine the next step in treatment. For the purpose of accuracy, follow these rules: Evaluate only the area treated. Do not report or include pain from an untreated area. For the purpose of this evaluation, ignore all other areas of pain, except for the treated  area. After your procedure, avoid taking a long nap and attempting to complete the pain diary after you wake up. Instead, set your alarm clock to go off every hour, on the hour, for the initial 8 hours after the procedure. Document the duration of the numbing medicine, and the relief you are getting from it. Do not go to sleep and attempt to complete it later. It will not be accurate. If you received sedation, it is likely that you were given a medication that may cause amnesia. Because of this, completing the diary at a later time may cause the information to be inaccurate. This information is needed to plan your care. Follow-up appointment: Keep your post-procedure follow-up evaluation appointment after the procedure (usually 2 weeks for most procedures, 6 weeks for radiofrequencies). DO NOT FORGET to bring you pain diary with you.   Expect: (What should I expect to see with my procedure?) From numbing medicine (AKA: Local Anesthetics): Numbness or decrease in pain. You may also experience some weakness, which if present, could last for the duration of the local anesthetic. Onset: Full effect within 15 minutes of injected. Duration: It will depend on the type of local anesthetic used. On the average, 1 to 8 hours.  From steroids (Applies only if steroids were used): Decrease in swelling or inflammation. Once inflammation is improved, relief of the pain will follow. Onset of benefits: Depends on the amount of swelling present. The more swelling, the longer it will take for the benefits to be seen. In some cases, up to 10 days. Duration: Steroids will stay in the system x 2 weeks. Duration of benefits will depend on multiple posibilities including persistent irritating factors. Side-effects: If present, they   may typically last 2 weeks (the duration of the steroids). Frequent: Cramps (if they occur, drink Gatorade and take over-the-counter Magnesium 450-500 mg once to twice a day); water retention with  temporary weight gain; increases in blood sugar; decreased immune system response; increased appetite. Occasional: Facial flushing (red, warm cheeks); mood swings; menstrual changes. Uncommon: Long-term decrease or suppression of natural hormones; bone thinning. (These are more common with higher doses or more frequent use. This is why we prefer that our patients avoid having any injection therapies in other practices.)  Very Rare: Severe mood changes; psychosis; aseptic necrosis. From procedure: Some discomfort is to be expected once the numbing medicine wears off. This should be minimal if ice and heat are applied as instructed.  Call if: (When should I call?) You experience numbness and weakness that gets worse with time, as opposed to wearing off. New onset bowel or bladder incontinence. (Applies only to procedures done in the spine)  Emergency Numbers: Durning business hours (Monday - Thursday, 8:00 AM - 4:00 PM) (Friday, 9:00 AM - 12:00 Noon): [phone] After hours: [phone] NOTE: If you are having a problem and are unable connect with, or to talk to a provider, then go to your nearest urgent care or emergency department. If the problem is serious and urgent, please call 911. ____________________________________________________________________________________________   ____________________________________________________________________________________________  Post-Procedure Discharge Instructions  Instructions: Apply ice:  Purpose: This will minimize any swelling and discomfort after procedure.  When: Day of procedure, as soon as you get home. How: Fill a plastic sandwich bag with crushed ice. Cover it with a small towel and apply to injection site. How long: (15 min on, 15 min off) Apply for 15 minutes then remove x 15 minutes.  Repeat sequence on day of procedure, until you go to bed. Apply heat:  Purpose: To treat any soreness and discomfort from the  procedure. When: Starting the next day after the procedure. How: Apply heat to procedure site starting the day following the procedure. How long: May continue to repeat daily, until discomfort goes away. Food intake: Start with clear liquids (like water) and advance to regular food, as tolerated.  Physical activities: Keep activities to a minimum for the first 8 hours after the procedure. After that, then as tolerated. Driving: If you have received any sedation, be responsible and do not drive. You are not allowed to drive for 24 hours after having sedation. Blood thinner: (Applies only to those taking blood thinners) You may restart your blood thinner 6 hours after your procedure. Insulin: (Applies only to Diabetic patients taking insulin) As soon as you can eat, you may resume your normal dosing schedule. Infection prevention: Keep procedure site clean and dry. Shower daily and clean area with soap and water. Post-procedure Pain Diary: Extremely important that this be done correctly and accurately. Recorded information will be used to determine the next step in treatment. For the purpose of accuracy, follow these rules: Evaluate only the area treated. Do not report or include pain from an untreated area. For the purpose of this evaluation, ignore all other areas of pain, except for the treated area. After your procedure, avoid taking a long nap and attempting to complete the pain diary after you wake up. Instead, set your alarm clock to go off every hour, on the hour, for the initial 8 hours after the procedure. Document the duration of the numbing medicine, and the relief you are getting from it. Do not go to sleep and attempt   to complete it later. It will not be accurate. If you received sedation, it is likely that you were given a medication that may cause amnesia. Because of this, completing the diary at a later time may cause the information to be inaccurate. This information is needed to plan  your care. Follow-up appointment: Keep your post-procedure follow-up evaluation appointment after the procedure (usually 2 weeks for most procedures, 6 weeks for radiofrequencies). DO NOT FORGET to bring you pain diary with you.   Expect: (What should I expect to see with my procedure?) From numbing medicine (AKA: Local Anesthetics): Numbness or decrease in pain. You may also experience some weakness, which if present, could last for the duration of the local anesthetic. Onset: Full effect within 15 minutes of injected. Duration: It will depend on the type of local anesthetic used. On the average, 1 to 8 hours.  From steroids (Applies only if steroids were used): Decrease in swelling or inflammation. Once inflammation is improved, relief of the pain will follow. Onset of benefits: Depends on the amount of swelling present. The more swelling, the longer it will take for the benefits to be seen. In some cases, up to 10 days. Duration: Steroids will stay in the system x 2 weeks. Duration of benefits will depend on multiple posibilities including persistent irritating factors. Side-effects: If present, they may typically last 2 weeks (the duration of the steroids). Frequent: Cramps (if they occur, drink Gatorade and take over-the-counter Magnesium 450-500 mg once to twice a day); water retention with temporary weight gain; increases in blood sugar; decreased immune system response; increased appetite. Occasional: Facial flushing (red, warm cheeks); mood swings; menstrual changes. Uncommon: Long-term decrease or suppression of natural hormones; bone thinning. (These are more common with higher doses or more frequent use. This is why we prefer that our patients avoid having any injection therapies in other practices.)  Very Rare: Severe mood changes; psychosis; aseptic necrosis. From procedure: Some discomfort is to be expected once the numbing medicine wears off. This should be minimal if ice and heat are  applied as instructed.  Call if: (When should I call?) You experience numbness and weakness that gets worse with time, as opposed to wearing off. New onset bowel or bladder incontinence. (Applies only to procedures done in the spine)  Emergency Numbers: Durning business hours (Monday - Thursday, 8:00 AM - 4:00 PM) (Friday, 9:00 AM - 12:00 Noon): [phone] After hours: [phone] NOTE: If you are having a problem and are unable connect with, or to talk to a provider, then go to your nearest urgent care or emergency department. If the problem is serious and urgent, please call 911. ____________________________________________________________________________________________   

## 2020-01-24 ENCOUNTER — Other Ambulatory Visit: Payer: Self-pay | Admitting: Family Medicine

## 2020-01-24 ENCOUNTER — Encounter: Payer: Self-pay | Admitting: Student in an Organized Health Care Education/Training Program

## 2020-01-24 DIAGNOSIS — G2581 Restless legs syndrome: Secondary | ICD-10-CM

## 2020-01-24 DIAGNOSIS — K219 Gastro-esophageal reflux disease without esophagitis: Secondary | ICD-10-CM

## 2020-01-24 NOTE — Progress Notes (Signed)
Patient states that she is now taking Gabapentin 600mg  QD and is helping her a lot.

## 2020-01-29 ENCOUNTER — Ambulatory Visit
Payer: BC Managed Care – PPO | Attending: Student in an Organized Health Care Education/Training Program | Admitting: Student in an Organized Health Care Education/Training Program

## 2020-01-29 ENCOUNTER — Encounter: Payer: Self-pay | Admitting: Psychiatry

## 2020-01-29 ENCOUNTER — Other Ambulatory Visit: Payer: Self-pay

## 2020-01-29 ENCOUNTER — Telehealth (INDEPENDENT_AMBULATORY_CARE_PROVIDER_SITE_OTHER): Payer: BC Managed Care – PPO | Admitting: Psychiatry

## 2020-01-29 ENCOUNTER — Encounter: Payer: Self-pay | Admitting: Student in an Organized Health Care Education/Training Program

## 2020-01-29 DIAGNOSIS — G894 Chronic pain syndrome: Secondary | ICD-10-CM

## 2020-01-29 DIAGNOSIS — F5105 Insomnia due to other mental disorder: Secondary | ICD-10-CM

## 2020-01-29 DIAGNOSIS — F410 Panic disorder [episodic paroxysmal anxiety] without agoraphobia: Secondary | ICD-10-CM | POA: Diagnosis not present

## 2020-01-29 DIAGNOSIS — M4317 Spondylolisthesis, lumbosacral region: Secondary | ICD-10-CM

## 2020-01-29 DIAGNOSIS — G2581 Restless legs syndrome: Secondary | ICD-10-CM

## 2020-01-29 DIAGNOSIS — M47816 Spondylosis without myelopathy or radiculopathy, lumbar region: Secondary | ICD-10-CM | POA: Diagnosis not present

## 2020-01-29 DIAGNOSIS — F331 Major depressive disorder, recurrent, moderate: Secondary | ICD-10-CM | POA: Diagnosis not present

## 2020-01-29 DIAGNOSIS — F411 Generalized anxiety disorder: Secondary | ICD-10-CM

## 2020-01-29 MED ORDER — VENLAFAXINE HCL ER 150 MG PO CP24: 150.0000 mg | ORAL_CAPSULE | Freq: Every day | ORAL | 1 refills | Status: DC

## 2020-01-29 MED ORDER — HYDROXYZINE HCL 25 MG PO TABS: 25.0000 mg | ORAL_TABLET | Freq: Two times a day (BID) | ORAL | 1 refills | Status: DC | PRN

## 2020-01-29 MED ORDER — GABAPENTIN 300 MG PO CAPS: ORAL_CAPSULE | ORAL | 2 refills | Status: DC

## 2020-01-29 MED ORDER — BUSPIRONE HCL 7.5 MG PO TABS: 7.5000 mg | ORAL_TABLET | Freq: Every day | ORAL | 1 refills | Status: DC

## 2020-01-29 NOTE — Progress Notes (Signed)
Provider Location : [facility] Patient Location : Home  Virtual Visit via Video Note  I connected with [name] on 01/29/20 at  9:40 AM EDT by a video enabled telemedicine application and verified that I am speaking with the correct person using two identifiers.   I discussed the limitations of evaluation and management by telemedicine and the availability of in person appointments. The patient expressed understanding and agreed to proceed.    I discussed the assessment and treatment plan with the patient. The patient was provided an opportunity to ask questions and all were answered. The patient agreed with the plan and demonstrated an understanding of the instructions.   The patient was advised to call back or seek an in-person evaluation if the symptoms worsen or if the condition fails to improve as anticipated.   [place] MD OP Progress Note  01/29/2020 1:14 PM YESIKA RISPOLI  MRN:  [id number]  Chief Complaint:  Chief Complaint    Follow-up     HPI: LILLYANNA GLANDON is a 54 year old Caucasian female who is married, lives in [place], has a history of depression, anxiety, insomnia, gastroesophageal reflux disease, lumbar disc problems, migraine headaches was evaluated by telemedicine today.  Patient today reports that she is currently making progress with regards to her mood symptoms.  She reports she stopped taking BuSpar twice a day and currently takes it only once a day since she developed side effects of sedation.  She also takes it towards the end of the day.  So far that is working out well for her.  She reports she was also recently started on gabapentin for pain by her pain provider Dr. Holley Raring.  She reports it has been at least a month since she started taking the gabapentin.  Patient does report some sluggishness and drowsiness during the day and she understands that her new medication gabapentin could also be contributing to it.  Patient reports sleep and appetite  as fair .  Patient denies any suicidality, homicidality or perceptual disturbances.  Patient continues to be in therapy and reports therapy sessions as beneficial.  Patient denies any other concerns today.  Visit Diagnosis:    ICD-10-CM   1. MDD (major depressive disorder), recurrent episode, moderate (HCC)  F33.1 venlafaxine XR (EFFEXOR-XR) 150 MG 24 hr capsule  2. GAD (generalized anxiety disorder)  F41.1 busPIRone (BUSPAR) 7.5 MG tablet    hydrOXYzine (ATARAX/VISTARIL) 25 MG tablet  3. Panic disorder  F41.0 busPIRone (BUSPAR) 7.5 MG tablet  4. Insomnia due to mental condition  F51.05     Past Psychiatric History: I have reviewed past psychiatric history from my progress note on 10/25/2018.  Past trials of medications like Paxil, trazodone, hydroxyzine.  Patient completed IOP-10/03/2019.  Past Medical History:  Past Medical History:  Diagnosis Date  . Allergy   . Anxiety   . Depression   . GERD (gastroesophageal reflux disease)   . History of kidney stones   . Insomnia   . Intermittent low back pain   . Migraines   . Osteoarthritis   . Recurrent UTI   . Restless leg syndrome   . Sciatica of right side   . Symptomatic menopausal or female climacteric states   . Vertigo   . Vitamin D deficiency     Past Surgical History:  Procedure Laterality Date  . BACK SURGERY    . COLONOSCOPY WITH PROPOFOL N/A 02/03/2019   Procedure: COLONOSCOPY WITH PROPOFOL;  Surgeon: Jonathon Bellows, MD;  Location: [facility];  Service: Gastroenterology;  Laterality: N/A;  . COLONOSCOPY WITH PROPOFOL N/A 03/10/2019   Procedure: COLONOSCOPY WITH PROPOFOL;  Surgeon: Jonathon Bellows, MD;  Location: [facility];  Service: Gastroenterology;  Laterality: N/A;  . DIAGNOSTIC LAPAROSCOPY    . DILATION AND CURETTAGE OF UTERUS    . KNEE ARTHROSCOPY WITH MEDIAL MENISECTOMY Left 01/14/2017   Procedure: KNEE ARTHROSCOPY WITH MEDIAL AND LATERAL MENISECTOMY;  Surgeon: Hessie Knows, MD;  Location: [facility];  Service:  Orthopedics;  Laterality: Left;  Partial Knee menisectomy  . KNEE SURGERY Left 05/2011   Dr. Rudene Christians- Arthroscopic  . LUMBAR LAMINECTOMY/DECOMPRESSION MICRODISCECTOMY N/A 09/16/2016   Procedure: LUMBAR LAMINECTOMY/DECOMPRESSION MICRODISCECTOMY 1 LEVEL L5-S1;  Surgeon: Meade Maw, MD;  Location: [facility];  Service: Neurosurgery;  Laterality: N/A;  . TONSILLECTOMY AND ADENOIDECTOMY    . URETHRAL STRICTURE DILATATION      Family Psychiatric History: I have reviewed family psychiatric history from my progress note on 10/25/2018  Family History:  Family History  Problem Relation Age of Onset  . Cancer Paternal Grandmother   . Anxiety disorder Cousin   . Depression Cousin     Social History: Reviewed social history from my progress note on 10/25/2018 Social History   Socioeconomic History  . Marital status: Married    Spouse name: Jenny Reichmann  . Number of children: 2  . Years of education: 3  . Highest education level: Associate degree: occupational, Hotel manager, or vocational program  Occupational History  . Occupation: not employed  Tobacco Use  . Smoking status: Former Smoker    Packs/day: 0.75    Years: 2.00    Pack years: 1.50    Types: Cigarettes    Start date: 07/06/1981    Quit date: 07/07/1983    Years since quitting: 36.5  . Smokeless tobacco: Never Used  Vaping Use  . Vaping Use: Never used  Substance and Sexual Activity  . Alcohol use: No    Alcohol/week: 0.0 standard drinks  . Drug use: No  . Sexual activity: Yes    Partners: Male  Other Topics Concern  . Not on file  Social History Narrative   Married and husband has a lot of medical problems, he is working again    Investment banker, operational of Radio broadcast assistant Strain: Medium Risk  . Difficulty of Paying Living Expenses: Somewhat hard  Food Insecurity: No Food Insecurity  . Worried About Charity fundraiser in the Last Year: Never true  . Ran Out of Food in the Last Year: Never true  Transportation Needs:  No Transportation Needs  . Lack of Transportation (Medical): No  . Lack of Transportation (Non-Medical): No  Physical Activity: Sufficiently Active  . Days of Exercise per Week: 5 days  . Minutes of Exercise per Session: 30 min  Stress: Stress Concern Present  . Feeling of Stress : To some extent  Social Connections: Socially Integrated  . Frequency of Communication with Friends and Family: More than three times a week  . Frequency of Social Gatherings with Friends and Family: More than three times a week  . Attends Religious Services: More than 4 times per year  . Active Member of Clubs or Organizations: Yes  . Attends Archivist Meetings: More than 4 times per year  . Marital Status: Married    Allergies:  Allergies  Allergen Reactions  . Penicillins Anaphylaxis, Hives, Nausea And Vomiting and Swelling    Has patient had a PCN reaction causing immediate rash, facial/tongue/throat swelling, SOB or lightheadedness  with hypotension: Yes Has patient had a PCN reaction causing severe rash involving mucus membranes or skin necrosis: Yes Has patient had a PCN reaction that required hospitalization No Has patient had a PCN reaction occurring within the last 10 years: Yes If all of the above answers are "NO", then may proceed with Cephalosporin use.   . Chlorhexidine Gluconate Itching and Rash  . Ciprofloxacin Hives  . Clindamycin/Lincomycin Hives  . Erythromycin Hives and Nausea And Vomiting  . Keflex [Cephalexin] Hives  . Lyrica [Pregabalin]     Dizziness, syncope  . Nitrofurantoin Monohyd Macro Hives and Nausea And Vomiting  . Other Hives  . Sulfa Antibiotics Hives and Nausea And Vomiting    Rapid heart rate  . Tetracyclines & Related Hives  . Adhesive [Tape] Rash  . Latex Rash  . Vancomycin Itching and Rash    Metabolic Disorder Labs: Lab Results  Component Value Date   HGBA1C 5.3 03/31/2019   MPG 105 03/31/2019   MPG 108 06/17/2018   No results found for:  PROLACTIN Lab Results  Component Value Date   CHOL 160 06/17/2018   TRIG 119 06/17/2018   HDL 56 06/17/2018   CHOLHDL 2.9 06/17/2018   VLDL 22 12/31/2016   LDLCALC 82 06/17/2018   LDLCALC 64 12/31/2016   Lab Results  Component Value Date   TSH 1.75 03/31/2019   TSH 2.56 12/31/2016    Therapeutic Level Labs: No results found for: LITHIUM No results found for: VALPROATE No components found for:  CBMZ  Current Medications: Current Outpatient Medications  Medication Sig Dispense Refill  . busPIRone (BUSPAR) 7.5 MG tablet Take 1 tablet (7.5 mg total) by mouth daily. 30 tablet 1  . Cholecalciferol 125 MCG (5000 UT) capsule Take by mouth.    . fluticasone (FLONASE) 50 MCG/ACT nasal spray Place 2 sprays into both nostrils daily. 16 g 2  . gabapentin (NEURONTIN) 300 MG capsule Take 1 capsule (300 mg total) by mouth at bedtime for 30 days, THEN 1 capsule (300 mg total) 2 (two) times daily. 90 capsule 0  . hydrOXYzine (ATARAX/VISTARIL) 25 MG tablet Take 1 tablet (25 mg total) by mouth 2 (two) times daily as needed for anxiety. 60 tablet 1  . loratadine (CLARITIN) 10 MG tablet Take 1 tablet (10 mg total) by mouth daily. 90 tablet 3  . mirtazapine (REMERON) 7.5 MG tablet Take 1 tablet (7.5 mg total) by mouth at bedtime. For sleep and anxiety 90 tablet 0  . montelukast (SINGULAIR) 10 MG tablet Take 1 tablet (10 mg total) by mouth at bedtime. 90 tablet 1  . omeprazole (PRILOSEC) 40 MG capsule TAKE ONE CAPSULE BY MOUTH EVERY MORNING 90 capsule 0  . rOPINIRole (REQUIP) 1 MG tablet TAKE ONE TABLET BY MOUTH EVERY NIGHT AT BEDTIME 62 tablet 0  . SUMAtriptan (IMITREX) 100 MG tablet Take 1 tablet (100 mg total) by mouth every 2 (two) hours as needed for migraine. May repeat in 2 hours if headache persists or recurs. 10 tablet 0  . tizanidine (ZANAFLEX) 2 MG capsule Take 1 capsule (2 mg total) by mouth daily as needed for muscle spasms. 90 capsule 0  . topiramate (TOPAMAX) 100 MG tablet Take 1 tablet  (100 mg total) by mouth every evening. 90 tablet 1  . venlafaxine XR (EFFEXOR-XR) 150 MG 24 hr capsule Take 1 capsule (150 mg total) by mouth daily with breakfast. 90 capsule 1   No current facility-administered medications for this visit.     Musculoskeletal: Strength &  Muscle Tone: UTA Gait & Station: normal Patient leans: N/A  Psychiatric Specialty Exam: Review of Systems  Constitutional: Positive for fatigue.  Musculoskeletal: Positive for back pain.  Psychiatric/Behavioral: Positive for sleep disturbance (Improving). The patient is nervous/anxious (improving).   All other systems reviewed and are negative.   There were no vitals taken for this visit.There is no height or weight on file to calculate BMI.  General Appearance: Casual  Eye Contact:  Fair  Speech:  Clear and Coherent  Volume:  Normal  Mood:  Anxious  Affect:  Appropriate  Thought Process:  Goal Directed and Descriptions of Associations: Intact  Orientation:  Full (Time, Place, and Person)  Thought Content: Logical   Suicidal Thoughts:  No  Homicidal Thoughts:  No  Memory:  Immediate;   Fair Recent;   Fair Remote;   Fair  Judgement:  Fair  Insight:  Fair  Psychomotor Activity:  Normal  Concentration:  Concentration: Fair and Attention Span: Fair  Recall:  [employer] of Knowledge: Fair  Language: Fair  Akathisia:  No  Handed:  Right  AIMS (if indicated): UTA  Assets:  Communication Skills Desire for Improvement Housing Social Support  ADL's:  Intact  Cognition: WNL  Sleep:  Improving   Screenings: GAD-7     Office Visit from 10/12/2019 in [facility] Office Visit from 06/20/2019 in [facility] Office Visit from 03/23/2019 in [facility] Office Visit from 02/22/2019 in [facility] Office Visit from 09/20/2018 in [facility]  Total GAD-7 Score 15 2 1 1 11     PHQ2-9     Procedure visit from 12/20/2019 in  [facility] Office Visit from 10/12/2019 in [facility] Office Visit from 06[facility]20/2019 in [facility] Office Visit from 03/23/2019 in [facility] Office Visit from 02/22/2019 in [facility]  PHQ-2 Total Score 0 6 1 0 1  PHQ-9 Total Score -- 20 6 2 4        Assessment and Plan: BESSIE BOYTE is a 54 year old female who has a history of panic attacks, insomnia arthritis, chronic pain, MDD, GAD was evaluated by telemedicine today.  Patient with psychosocial stressors of physical limitations, financial problems and pain.  She continues to have psychosocial stressors of relationship struggles, her son's mental health problems, custody battle of her grandchildren.  Patient will continue to benefit from medication readjustment since she has possible side effects to combination of medication and is likely on polypharmacy.  Patient will continue to benefit from psychotherapy sessions.  Plan as noted below.  Plan MDD-improving Reduce venlafaxine to 150 mg p.o. daily Mirtazapine 7.5 mg p.o. nightly  GAD-improving Venlafaxine as prescribed. Remeron 7.5 mg p.o. nightly BuSpar 7.5 mg p.o. daily. She was also recently started on gabapentin for pain which will also help with her mood. Continue CBT  Panic attacks-improving BuSpar 7.5 mg p.o. daily Hydroxyzine 25 mg p.o. twice daily as needed Continue CBT  Insomnia-improving We will taper off prazosin since she currently denies any nightmares. Remeron 7.5 mg p.o. nightly for sleep  Follow-up in clinic in 6 to 8 weeks or sooner if needed.  I have spent atleast 20 minutes non face to face with patient today. More than 50 % of the time was spent for preparing to see the patient ( e.g., review of test, records ),  ordering medications and test ,psychoeducation and supportive psychotherapy and  care coordination,as well as documenting  clinical information in electronic health record. This note was generated in part or whole with voice recognition software. Voice recognition is usually quite accurate but there are transcription errors that can and very often do occur. I apologize for any typographical errors that were not detected and corrected.      Ursula Alert, MD 01/29/2020, 1:14 PM

## 2020-01-29 NOTE — Progress Notes (Signed)
Patient: Kelly [name]  Service Category: E/M  Provider: Gillis Santa, MD  DOB: 06/06/1966  DOS: 01/29/2020  Location: Office  MRN: [id number]  Setting: Ambulatory outpatient  Referring Provider: Steele Sizer, MD  Type: Established Patient  Specialty: Interventional Pain Management  PCP: Steele Sizer, MD  Location: Home  Delivery: TeleHealth     Virtual Encounter - Pain Management PROVIDER NOTE: Information contained herein reflects review and annotations entered in association with encounter. Interpretation of such information and data should be left to medically-trained personnel. Information provided to patient can be located elsewhere in the medical record under "Patient Instructions". Document created using STT-dictation technology, any transcriptional errors that may result from process are unintentional.    Contact & Pharmacy Preferred: [phone] Home: [phone] (home) Mobile: [phone] (mobile) E-mail: [email or web address], [address] [place] [address] Phone: [phone] Fax: [phone]   Pre-screening  [name] offered "in-person" vs "virtual" encounter. She indicated preferring virtual for this encounter.   Reason COVID-19*  Social distancing based on CDC and AMA recommendations.   I contacted [name] on 01/29/2020 via video conference.      I clearly identified myself as Gillis Santa, MD. I verified that I was speaking with the correct person using two identifiers (Name: Kelly [name], and date of birth: 1966/05/05).  Consent I sought verbal advanced consent from [name] for virtual visit interactions. I informed [name] of possible security and privacy concerns, risks, and limitations associated with providing "not-in-person" medical evaluation and management services. I also informed [name] of the availability of "in-person"  appointments. Finally, I informed her that there would be a charge for the virtual visit and that she could be  personally, fully or partially, financially responsible for it. Ms. Balbi expressed understanding and agreed to proceed.   Historic Elements   [name] is a 54 y.o. year old, female patient evaluated today after her last contact with our practice on 12/20/2019. [name]  has a past medical history of Allergy, Anxiety, Depression, GERD (gastroesophageal reflux disease), History of kidney stones, Insomnia, Intermittent low back pain, Migraines, Osteoarthritis, Recurrent UTI, Restless leg syndrome, Sciatica of right side, Symptomatic menopausal or female climacteric states, Vertigo, and Vitamin D deficiency. She also  has a past surgical history that includes Tonsillectomy and adenoidectomy; Urethra dilation; Knee surgery (Left, 05/2011); Lumbar laminectomy/decompression microdiscectomy (N/A, 09/16/2016); Back surgery; Diagnostic laparoscopy; Dilation and curettage of uterus; Knee arthroscopy with medial menisectomy (Left, 01/14/2017); Colonoscopy with propofol (N/A, 02/03/2019); and Colonoscopy with propofol (N/A, 03/10/2019). [name] has a current medication list which includes the following prescription(s): cholecalciferol, fluticasone, gabapentin, loratadine, mirtazapine, montelukast, sumatriptan, tizanidine, topiramate, buspirone, hydroxyzine, omeprazole, ropinirole, and venlafaxine xr. She  reports that she quit smoking about 36 years ago. Her smoking use included cigarettes. She started smoking about 38 years ago. She has a 1.50 pack-year smoking history. She has never used smokeless tobacco. She reports that she does not drink alcohol and does not use drugs. Ms. Struckman is allergic to penicillins, chlorhexidine gluconate, ciprofloxacin, clindamycin/lincomycin, erythromycin, keflex [cephalexin], lyrica [pregabalin], nitrofurantoin monohyd macro, other, sulfa antibiotics,  tetracyclines & related, adhesive [tape], latex, and vancomycin.   HPI  Today, she is being contacted for a post-procedure assessment.    Post-Procedure Evaluation  Procedure (12/20/2019):   Type: Therapeutic Medial Branch Facet Block with steroid #3 (#2 done 07/22/2017) Region: Lumbar Level: L2/3, L3/4, L4/5 Laterality: Bilateral  Sedation: Please see nurses note.  Effectiveness during initial hour after procedure(Ultra-Short Term Relief): 100 %   Local anesthetic used: Long-acting (4-6 hours) Effectiveness: Defined as any analgesic benefit obtained secondary to the administration of local anesthetics. This carries significant diagnostic value as to the etiological location, or anatomical origin, of the pain. Duration of benefit is expected to coincide with the duration of the local anesthetic used.  Effectiveness during initial 4-6 hours after procedure(Short-Term Relief): 100 %   Long-term benefit: Defined as any relief past the pharmacologic duration of the local anesthetics.  Effectiveness past the initial 6 hours after procedure(Long-Term Relief): 100 % (right 100%, left 75%)   Current benefits: Defined as benefit that persist at this time.   Analgesia:  >50% relief Function: [name] reports improvement in function ROM: [name] reports improvement in ROM  Patient states that she is doing much better after her therapeutic medial branch facet block performed on 12/20/2019.  She is endorsing approximately 100% pain relief on her right low back and buttock region but occasionally does have pain along the left side and that is why she rates it at 75% pain relief currently.  She is finding benefit with gabapentin 300 mg twice a day.  We discussed increasing her nighttime dose to 600 mg to help out with her restless leg as below.  Laboratory Chemistry Profile   Renal Lab Results  Component Value Date   BUN 16 03/31/2019   CREATININE 0.79 22/63/3354   BCR NOT APPLICABLE  56/25/6389   GFRAA 99 03/31/2019   GFRNONAA 85 03/31/2019     Hepatic Lab Results  Component Value Date   AST 16 03/31/2019   ALT 13 03/31/2019   ALBUMIN 4.4 01/04/2017   ALKPHOS 69 01/04/2017   LIPASE 26 01/04/2017     Electrolytes Lab Results  Component Value Date   NA 137 03/31/2019   K 4.4 03/31/2019   CL 105 03/31/2019   CALCIUM 9.2 03/31/2019     Bone Lab Results  Component Value Date   VD25OH 37 03/31/2019     Inflammation (CRP: Acute Phase) (ESR: Chronic Phase) No results found for: CRP, ESRSEDRATE, LATICACIDVEN     Note: Above Lab results reviewed.    Assessment  The primary encounter diagnosis was Lumbar spondylosis. Diagnoses of Spondylolisthesis of lumbosacral region, Chronic pain syndrome, and Restless leg were also pertinent to this visit.  Plan of Care   Ms. [name] has a current medication list which includes the following long-term medication(s): fluticasone, gabapentin, loratadine, mirtazapine, montelukast, sumatriptan, topiramate, omeprazole, ropinirole, and venlafaxine xr.  Pharmacotherapy (Medications Ordered): Meds ordered this encounter  Medications  . gabapentin (NEURONTIN) 300 MG capsule    Sig: 300 mg qAM, 600 mg QHS    Dispense:  90 capsule    Refill:  2   Orders:  Orders Placed This Encounter  Procedures  . LUMBAR FACET(MEDIAL BRANCH NERVE BLOCK) MBNB    Scheduling timeframe: (PRN procedure) Ms. Allport will call when needed. Clinical indication: Axial low back pain. Lumbosacral Spondylosis (M47.897).  Sedation: Usually done with sedation. (May be done without sedation if so desired by patient.) Requirements: NPO x 8 hrs.; Driver; Stop blood thinners. Interval: No sooner than two weeks for diagnostic or therapeutic. No sooner than every other month for palliative.    Standing Status:   Standing    Number of Occurrences:   5    Standing Expiration Date:   01/28/2021    Scheduling Instructions:  Procedure: Lumbar  facet block (AKA.: Lumbosacral medial branch nerve block)     Level: L2-3, L3-4, & L4-L5 Facets ( Medial Branch Nerves)     Laterality: Bilateral    Order Specific Question:   Where will this procedure be performed?    Answer:   [facility]   Follow-up plan:   Return in about 3 months (around 04/30/2020) for Medication Management, in person.   Recent Visits Date Type Provider Dept  12/20/19 Procedure visit Gillis Santa, MD [facility]  12/14/19 Office Visit Gillis Santa, MD [facility]  Showing recent visits within past 90 days and meeting all other requirements Today's Visits Date Type Provider Dept  01/29/20 Telemedicine Gillis Santa, MD [facility]  Showing today's visits and meeting all other requirements Future Appointments Date Type Provider Dept  02/08/20 Appointment Gillis Santa, MD [facility]  Showing future appointments within next 90 days and meeting all other requirements  I discussed the assessment and treatment plan with the patient. The patient was provided an opportunity to ask questions and all were answered. The patient agreed with the plan and demonstrated an understanding of the instructions.  Patient advised to call back or seek an in-person evaluation if the symptoms or condition worsens.  Duration of encounter: 30 minutes.  Note by: Gillis Santa, MD Date: 01/29/2020; Time: 1:44 PM

## 2020-02-08 ENCOUNTER — Encounter: Payer: BC Managed Care – PPO | Admitting: Student in an Organized Health Care Education/Training Program

## 2020-03-07 ENCOUNTER — Ambulatory Visit: Payer: Self-pay | Admitting: *Deleted

## 2020-03-07 NOTE — Telephone Encounter (Signed)
C/o vaginal bleeding this am light flow at this time. C/o pain in low back this week. No period in 1 year. LMP 02/21/2019. C/o lightheadedness today with some SOB and chest pain. Last night chills. No report of temperature. C/o fatigue and feeling emotional this week. appt scheduled for 03/08/20 virtual visit. Care advise given. Patient verbalized understanding of care advise and to call back if symptoms worsen or go to ED.   Reason for Disposition . Postmenopausal vaginal bleeding  Answer Assessment - Initial Assessment Questions 1. AMOUNT: "Describe the bleeding that you are having." "How much bleeding is there?"    - SPOTTING: spotting, or pinkish / brownish mucous discharge; does not fill panti-liner or pad    - MILD:  less than 1 pad / hour; less than patient's usual menstrual bleeding   - MODERATE: 1-2 pads / hour; 1 menstrual cup every 6 hours; small-medium blood clots (e.g., pea, grape, small coin)   - SEVERE: soaking 2 or more pads/hour for 2 or more hours; 1 menstrual cup every 2 hours; bleeding not contained by pads or continuous red blood from vagina; large blood clots (e.g., golf ball, large coin)      Light flow 2. ONSET: "When did the bleeding begin?" "Is it continuing now?"     This am continues now  3. MENOPAUSE: "When was your last menstrual period?"      Over 1 year ago February 21, 2019 4. ABDOMINAL PAIN: "Do you have any pain?" "How bad is the pain?"  (e.g., Scale 1-10; mild, moderate, or severe)   - MILD (1-3): doesn't interfere with normal activities, abdomen soft and not tender to touch    - MODERATE (4-7): interferes with normal activities or awakens from sleep, tender to touch    - SEVERE (8-10): excruciating pain, doubled over, unable to do any normal activities      No abdominal pain but low back pain. Moderate- severe 5. BLOOD THINNERS: "Do you take any blood thinners?" (e.g., Coumadin/warfarin, Pradaxa/dabigatran, aspirin)     No  6. HORMONES: "Are you taking any  hormone medications, prescription or OTC?" (e.g., birth control pills, estrogen)     No  7. CAUSE: "What do you think is causing the bleeding?" (e.g., recent gyn surgery, recent gyn procedure; known bleeding disorder, uterine cancer)       No sure  8. HEMODYNAMIC STATUS: "Are you weak or feeling lightheaded?" If Yes, ask: "Can you stand and walk normally?"       Weak and lightheaded.  9. OTHER SYMPTOMS: "What other symptoms are you having with the bleeding?" (e.g., back pain, burning with urination, fever)     Back pain , cloudy urine noted, fever  Protocols used: VAGINAL BLEEDING - POSTMENOPAUSAL-A-AH

## 2020-03-08 ENCOUNTER — Telehealth (INDEPENDENT_AMBULATORY_CARE_PROVIDER_SITE_OTHER): Payer: BC Managed Care – PPO | Admitting: Family Medicine

## 2020-03-08 ENCOUNTER — Encounter: Payer: Self-pay | Admitting: Family Medicine

## 2020-03-08 VITALS — Ht 64.0 in | Wt 212.0 lb

## 2020-03-08 DIAGNOSIS — R300 Dysuria: Secondary | ICD-10-CM

## 2020-03-08 DIAGNOSIS — N950 Postmenopausal bleeding: Secondary | ICD-10-CM | POA: Diagnosis not present

## 2020-03-08 NOTE — Progress Notes (Signed)
Name: Kelly [name]   MRN: [id number]    DOB: [birth year]   Date:03/08/2020       Progress Note  Subjective  Chief Complaint  Chief Complaint  Patient presents with  . Vaginal Bleeding  . Urinary Tract Infection    I connected with  Kelly [name]  on 03/08/20 at  1:40 PM EDT by a video enabled telemedicine application and verified that I am speaking with the correct person using two identifiers.  I discussed the limitations of evaluation and management by telemedicine and the availability of in person appointments. The patient expressed understanding and agreed to proceed. Staff also discussed with the patient that there may be a patient responsible charge related to this service. Patient Location: at home  Provider Location: [facility] Additional Individuals present: none   HPI  Post-menopause bleeding: she states LMP was 02/2019, she noticed some spotting yesterday and today looks more like a period, she is having cramping 5/10 in pain, that never happened before , she has also noticed lower back pain. No nausea, vomiting. She has noticed some dizziness and states the bleeding is heavy, advised to go to [facility] if bleeding gets heavy or symptoms gets worse, otherwise referral placed to gyn   Dysuria: she states over the past few days she has noticed dysuria, urinary frequency and urgency, she has also had urinary incontinence with episodes. She states she think it was triggered by not drinking enough fluids when working in the attic. No hesitancy. No fever or chills. She states bladder symptoms have improved with cranberry juice and water, she is allergic to multiple antibiotics , she is afraid to get any at this time and I am not sure what to give to her today. Advised cranberry pills and more fluids for now   Patient Active Problem List   Diagnosis Date Noted  . Chronic pain syndrome 12/14/2019  . Lumbar spondylosis 05/01/2019  . Cervicalgia 05/01/2019  . Encounter for screening  colonoscopy   . Polyp of sigmoid colon   . GAD (generalized anxiety disorder) 02/28/2019  . Panic disorder 02/28/2019  . Insomnia due to mental condition 02/28/2019  . Morbid obesity ([place]) 07/26/2017  . Spondylolisthesis of lumbosacral region 02/11/2016  . Hematuria 04/09/2015  . Hyperglycemia 04/06/2015  . Right lumbar radiculitis 12/28/2014  . MDD (major depressive disorder), recurrent episode, moderate ([place]) 12/27/2014  . Gastro-esophageal reflux disease without esophagitis 12/27/2014  . Bulge of lumbar disc without myelopathy 12/27/2014  . Dysmetabolic syndrome 65/46/5035  . Migraine without aura and responsive to treatment 12/27/2014  . Osteoarthrosis 12/27/2014  . Obesity (BMI 30-39.9) 12/27/2014  . Restless leg 12/27/2014  . Allergic rhinitis, seasonal 12/27/2014    Past Surgical History:  Procedure Laterality Date  . BACK SURGERY    . COLONOSCOPY WITH PROPOFOL N/A 02/03/2019   Procedure: COLONOSCOPY WITH PROPOFOL;  Surgeon: Jonathon Bellows, MD;  Location[facility] ENDOSCOPY;  Service: Gastroenterology;  Laterality: N/A;  . COLONOSCOPY WITH PROPOFOL N/A 03/10/2019   Procedure: COLONOSCOPY WITH PROPOFOL;  Surgeon: Jonathon Bellows, MD;  Location: [facility] ENDOSCOPY;  Service: Gastroenterology;  Laterality: N/A;  . DIAGNOSTIC LAPAROSCOPY    . DILATION AND CURETTAGE OF UTERUS    . KNEE ARTHROSCOPY WITH MEDIAL MENISECTOMY Left 01/14/2017   Procedure: KNEE ARTHROSCOPY WITH MEDIAL AND LATERAL MENISECTOMY;  Surgeon: Hessie Knows, MD;  Location: [facility] ORS;  Service: Orthopedics;  Laterality: Left;  Partial Knee menisectomy  . KNEE SURGERY Left 05/2011   Dr. Rudene Christians- Arthroscopic  . LUMBAR LAMINECTOMY/DECOMPRESSION  MICRODISCECTOMY N/A 09/16/2016   Procedure: LUMBAR LAMINECTOMY/DECOMPRESSION MICRODISCECTOMY 1 LEVEL L5-S1;  Surgeon: Meade Maw, MD;  Location: [facility] ORS;  Service: Neurosurgery;  Laterality: N/A;  . TONSILLECTOMY AND ADENOIDECTOMY    . URETHRAL STRICTURE DILATATION      Family History   Problem Relation Age of Onset  . Cancer Paternal Grandmother   . Anxiety disorder Cousin   . Depression Cousin     Social History   Socioeconomic History  . Marital status: Married    Spouse name: Jenny Reichmann  . Number of children: 2  . Years of education: 20  . Highest education level: Associate degree: occupational, Hotel manager, or vocational program  Occupational History  . Occupation: not employed  Tobacco Use  . Smoking status: Former Smoker    Packs/day: 0.75    Years: 2.00    Pack years: 1.50    Types: Cigarettes    Start date: 07/06/1981    Quit date: 07/07/1983    Years since quitting: 36.6  . Smokeless tobacco: Never Used  Vaping Use  . Vaping Use: Never used  Substance and Sexual Activity  . Alcohol use: No    Alcohol/week: 0.0 standard drinks  . Drug use: No  . Sexual activity: Yes    Partners: Male  Other Topics Concern  . Not on file  Social History Narrative   Married and husband has a lot of medical problems, he is working again    Investment banker, operational of Radio broadcast assistant Strain: Medium Risk  . Difficulty of Paying Living Expenses: Somewhat hard  Food Insecurity: No Food Insecurity  . Worried About Charity fundraiser in the Last Year: Never true  . Ran Out of Food in the Last Year: Never true  Transportation Needs: No Transportation Needs  . Lack of Transportation (Medical): No  . Lack of Transportation (Non-Medical): No  Physical Activity:   . Days of Exercise per Week: Not on file  . Minutes of Exercise per Session: Not on file  Stress: Stress Concern Present  . Feeling of Stress : To some extent  Social Connections: Socially Integrated  . Frequency of Communication with Friends and Family: More than three times a week  . Frequency of Social Gatherings with Friends and Family: More than three times a week  . Attends Religious Services: More than 4 times per year  . Active Member of Clubs or Organizations: Yes  . Attends Archivist  Meetings: More than 4 times per year  . Marital Status: Married  Human resources officer Violence: Not At Risk  . Fear of Current or Ex-Partner: No  . Emotionally Abused: No  . Physically Abused: No  . Sexually Abused: No     Current Outpatient Medications:  .  busPIRone (BUSPAR) 7.5 MG tablet, Take 1 tablet (7.5 mg total) by mouth daily., Disp: 30 tablet, Rfl: 1 .  fluticasone (FLONASE) 50 MCG/ACT nasal spray, Place 2 sprays into both nostrils daily., Disp: 16 g, Rfl: 2 .  gabapentin (NEURONTIN) 300 MG capsule, 300 mg qAM, 600 mg QHS, Disp: 90 capsule, Rfl: 2 .  hydrOXYzine (ATARAX/VISTARIL) 25 MG tablet, Take 1 tablet (25 mg total) by mouth 2 (two) times daily as needed for anxiety., Disp: 60 tablet, Rfl: 1 .  loratadine (CLARITIN) 10 MG tablet, Take 1 tablet (10 mg total) by mouth daily., Disp: 90 tablet, Rfl: 3 .  mirtazapine (REMERON) 7.5 MG tablet, Take 1 tablet (7.5 mg total) by mouth at bedtime. For sleep and  anxiety, Disp: 90 tablet, Rfl: 0 .  montelukast (SINGULAIR) 10 MG tablet, Take 1 tablet (10 mg total) by mouth at bedtime., Disp: 90 tablet, Rfl: 1 .  omeprazole (PRILOSEC) 40 MG capsule, TAKE ONE CAPSULE BY MOUTH EVERY MORNING, Disp: 90 capsule, Rfl: 0 .  rOPINIRole (REQUIP) 1 MG tablet, TAKE ONE TABLET BY MOUTH EVERY NIGHT AT BEDTIME, Disp: 62 tablet, Rfl: 0 .  SUMAtriptan (IMITREX) 100 MG tablet, Take 1 tablet (100 mg total) by mouth every 2 (two) hours as needed for migraine. May repeat in 2 hours if headache persists or recurs., Disp: 10 tablet, Rfl: 0 .  tizanidine (ZANAFLEX) 2 MG capsule, Take 1 capsule (2 mg total) by mouth daily as needed for muscle spasms., Disp: 90 capsule, Rfl: 0 .  topiramate (TOPAMAX) 100 MG tablet, Take 1 tablet (100 mg total) by mouth every evening., Disp: 90 tablet, Rfl: 1 .  venlafaxine XR (EFFEXOR-XR) 150 MG 24 hr capsule, Take 1 capsule (150 mg total) by mouth daily with breakfast., Disp: 90 capsule, Rfl: 1 .  Cholecalciferol 125 MCG (5000 UT)  capsule, Take by mouth. (Patient not taking: Reported on 03/08/2020), Disp: , Rfl:   Allergies  Allergen Reactions  . Penicillins Anaphylaxis, Hives, Nausea And Vomiting and Swelling    Has patient had a PCN reaction causing immediate rash, facial/tongue/throat swelling, SOB or lightheadedness with hypotension: Yes Has patient had a PCN reaction causing severe rash involving mucus membranes or skin necrosis: Yes Has patient had a PCN reaction that required hospitalization No Has patient had a PCN reaction occurring within the last 10 years: Yes If all of the above answers are "NO", then may proceed with Cephalosporin use.   . Chlorhexidine Gluconate Itching and Rash  . Ciprofloxacin Hives  . Clindamycin/Lincomycin Hives  . Erythromycin Hives and Nausea And Vomiting  . Keflex [Cephalexin] Hives  . Lyrica [Pregabalin]     Dizziness, syncope  . Nitrofurantoin Monohyd Macro Hives and Nausea And Vomiting  . Other Hives  . Sulfa Antibiotics Hives and Nausea And Vomiting    Rapid heart rate  . Tetracyclines & Related Hives  . Adhesive [Tape] Rash  . Latex Rash  . Vancomycin Itching and Rash    I personally reviewed active problem list, medication list, allergies, family history with the patient/caregiver today.   ROS  Ten systems reviewed and is negative except as mentioned in HPI  Objective  Virtual encounter, vitals not obtained.  Body mass index is 36.39 kg/m.  Physical Exam  Awake, alert and oriented, well groomed and in no distress   PHQ2/9: Depression screen [facility] 2/9 03/08/2020 12/20/2019 10/12/2019 06/20/2019 03/23/2019  Decreased Interest 1 0 3 0 0  Down, Depressed, Hopeless 0 0 3 1 0  PHQ - 2 Score 1 0 6 1 0  Altered sleeping - - 3 2 1   Tired, decreased energy - - 2 2 1   Change in appetite - - 3 0 0  Feeling bad or failure about yourself  - - 2 1 0  Trouble concentrating - - 2 0 0  Moving slowly or fidgety/restless - - 1 0 0  Suicidal thoughts - - 1 0 0  PHQ-9 Score  - - 20 6 2   Difficult doing work/chores - - Somewhat difficult Somewhat difficult Not difficult at all  Some recent data might be hidden   PHQ-2/9 Result is positive.    Fall Risk: Fall Risk  03/08/2020 12/20/2019 12/14/2019 10/12/2019 06/20/2019  Falls in the past year?  0 0 0 0 1  Number falls in past yr: 0 - - 0 1  Comment - - - - -  Injury with Fall? 0 - - 0 0  Comment - - - - -  Risk for fall due to : - - - - -  Risk for fall due to: Comment - - - - -  Follow up - - - - -     Assessment & Plan  1. Post-menopausal bleeding  - Ambulatory referral to Obstetrics / Gynecology  2. Dysuria  She states getting better and wants to hold off on antibiotics   I discussed the assessment and treatment plan with the patient. The patient was provided an opportunity to ask questions and all were answered. The patient agreed with the plan and demonstrated an understanding of the instructions.  The patient was advised to call back or seek an in-person evaluation if the symptoms worsen or if the condition fails to improve as anticipated.  I provided 15 minutes of non-face-to-face time during this encounter.

## 2020-03-09 ENCOUNTER — Emergency Department
Admission: EM | Admit: 2020-03-09 | Discharge: 2020-03-09 | Disposition: A | Discharge status: Home / Self Care | Payer: BC Managed Care – PPO | Attending: Emergency Medicine | Admitting: Emergency Medicine

## 2020-03-09 ENCOUNTER — Other Ambulatory Visit: Payer: Self-pay

## 2020-03-09 ENCOUNTER — Emergency Department: Payer: BC Managed Care – PPO

## 2020-03-09 ENCOUNTER — Encounter: Payer: Self-pay | Admitting: Emergency Medicine

## 2020-03-09 DIAGNOSIS — F329 Major depressive disorder, single episode, unspecified: Secondary | ICD-10-CM | POA: Diagnosis not present

## 2020-03-09 DIAGNOSIS — F419 Anxiety disorder, unspecified: Secondary | ICD-10-CM | POA: Diagnosis not present

## 2020-03-09 DIAGNOSIS — N939 Abnormal uterine and vaginal bleeding, unspecified: Secondary | ICD-10-CM | POA: Diagnosis not present

## 2020-03-09 DIAGNOSIS — K5900 Constipation, unspecified: Secondary | ICD-10-CM | POA: Diagnosis not present

## 2020-03-09 DIAGNOSIS — N950 Postmenopausal bleeding: Secondary | ICD-10-CM | POA: Diagnosis not present

## 2020-03-09 DIAGNOSIS — R1030 Lower abdominal pain, unspecified: Secondary | ICD-10-CM | POA: Diagnosis not present

## 2020-03-09 DIAGNOSIS — R109 Unspecified abdominal pain: Secondary | ICD-10-CM | POA: Diagnosis not present

## 2020-03-09 DIAGNOSIS — M1990 Unspecified osteoarthritis, unspecified site: Secondary | ICD-10-CM | POA: Diagnosis not present

## 2020-03-09 DIAGNOSIS — R197 Diarrhea, unspecified: Secondary | ICD-10-CM | POA: Diagnosis not present

## 2020-03-09 DIAGNOSIS — N888 Other specified noninflammatory disorders of cervix uteri: Secondary | ICD-10-CM | POA: Diagnosis not present

## 2020-03-09 DIAGNOSIS — K429 Umbilical hernia without obstruction or gangrene: Secondary | ICD-10-CM | POA: Diagnosis not present

## 2020-03-09 DIAGNOSIS — N281 Cyst of kidney, acquired: Secondary | ICD-10-CM | POA: Diagnosis not present

## 2020-03-09 DIAGNOSIS — M47816 Spondylosis without myelopathy or radiculopathy, lumbar region: Secondary | ICD-10-CM | POA: Diagnosis not present

## 2020-03-09 DIAGNOSIS — K760 Fatty (change of) liver, not elsewhere classified: Secondary | ICD-10-CM | POA: Diagnosis not present

## 2020-03-09 DIAGNOSIS — Z87891 Personal history of nicotine dependence: Secondary | ICD-10-CM | POA: Diagnosis not present

## 2020-03-09 DIAGNOSIS — N83299 Other ovarian cyst, unspecified side: Secondary | ICD-10-CM | POA: Diagnosis not present

## 2020-03-09 DIAGNOSIS — G43909 Migraine, unspecified, not intractable, without status migrainosus: Secondary | ICD-10-CM | POA: Diagnosis not present

## 2020-03-09 DIAGNOSIS — Z5321 Procedure and treatment not carried out due to patient leaving prior to being seen by health care provider: Secondary | ICD-10-CM | POA: Insufficient documentation

## 2020-03-09 DIAGNOSIS — N920 Excessive and frequent menstruation with regular cycle: Secondary | ICD-10-CM | POA: Diagnosis not present

## 2020-03-09 LAB — URINALYSIS, COMPLETE (UACMP) WITH MICROSCOPIC
Bilirubin Urine: NEGATIVE
Glucose, UA: NEGATIVE mg/dL
Ketones, ur: NEGATIVE mg/dL
Nitrite: NEGATIVE
Protein, ur: NEGATIVE mg/dL
Specific Gravity, Urine: 1.013 (ref 1.005–1.030)
pH: 5 (ref 5.0–8.0)

## 2020-03-09 LAB — CBC
HCT: 41.5 % (ref 36.0–46.0)
Hemoglobin: 13.4 g/dL (ref 12.0–15.0)
MCH: 29.5 pg (ref 26.0–34.0)
MCHC: 32.3 g/dL (ref 30.0–36.0)
MCV: 91.4 fL (ref 80.0–100.0)
Platelets: 330 10*3/uL (ref 150–400)
RBC: 4.54 MIL/uL (ref 3.87–5.11)
RDW: 12.5 % (ref 11.5–15.5)
WBC: 9.9 10*3/uL (ref 4.0–10.5)
nRBC: 0 % (ref 0.0–0.2)

## 2020-03-09 LAB — BASIC METABOLIC PANEL
Anion gap: 6 (ref 5–15)
BUN: 13 mg/dL (ref 6–20)
CO2: 24 mmol/L (ref 22–32)
Calcium: 8.7 mg/dL — ABNORMAL LOW (ref 8.9–10.3)
Chloride: 108 mmol/L (ref 98–111)
Creatinine, Ser: 0.72 mg/dL (ref 0.44–1.00)
GFR calc Af Amer: >60 mL/min (ref >60)
GFR calc non Af Amer: >60 mL/min (ref >60)
Glucose, Bld: 88 mg/dL (ref 70–99)
Potassium: 4.5 mmol/L (ref 3.5–5.1)
Sodium: 138 mmol/L (ref 135–145)

## 2020-03-09 LAB — PREGNANCY, URINE: Preg Test, Ur: NEGATIVE

## 2020-03-09 NOTE — ED Triage Notes (Addendum)
Pt has not had period in a year, here for vaginal bleeding since Thursday.  Has used 4 pads this morning and one tampon.  Cramping to pelvic area.  No hx fibroids. Pt also recently talked to doctor about possible UTI but has not started abx.

## 2020-03-09 NOTE — ED Notes (Signed)
Per registration, patient left with husband to go to [facility] to be seen due to wait times.

## 2020-03-10 DIAGNOSIS — R109 Unspecified abdominal pain: Secondary | ICD-10-CM | POA: Diagnosis not present

## 2020-03-10 DIAGNOSIS — R197 Diarrhea, unspecified: Secondary | ICD-10-CM | POA: Diagnosis not present

## 2020-03-10 DIAGNOSIS — N83299 Other ovarian cyst, unspecified side: Secondary | ICD-10-CM | POA: Diagnosis not present

## 2020-03-10 DIAGNOSIS — N920 Excessive and frequent menstruation with regular cycle: Secondary | ICD-10-CM | POA: Diagnosis not present

## 2020-03-15 ENCOUNTER — Ambulatory Visit (INDEPENDENT_AMBULATORY_CARE_PROVIDER_SITE_OTHER): Payer: BC Managed Care – PPO | Admitting: Obstetrics and Gynecology

## 2020-03-15 ENCOUNTER — Encounter: Payer: Self-pay | Admitting: Obstetrics and Gynecology

## 2020-03-15 ENCOUNTER — Other Ambulatory Visit: Payer: Self-pay

## 2020-03-15 ENCOUNTER — Other Ambulatory Visit (HOSPITAL_COMMUNITY)
Admission: RE | Admit: 2020-03-15 | Discharge: 2020-03-15 | Disposition: A | Discharge status: Home / Self Care | Payer: BC Managed Care – PPO | Source: Ambulatory Visit | Attending: Obstetrics and Gynecology | Admitting: Obstetrics and Gynecology

## 2020-03-15 VITALS — BP 120/80 | Ht 64.0 in | Wt 227.0 lb

## 2020-03-15 DIAGNOSIS — N950 Postmenopausal bleeding: Secondary | ICD-10-CM | POA: Insufficient documentation

## 2020-03-15 DIAGNOSIS — Z124 Encounter for screening for malignant neoplasm of cervix: Secondary | ICD-10-CM

## 2020-03-15 DIAGNOSIS — N840 Polyp of corpus uteri: Secondary | ICD-10-CM | POA: Diagnosis not present

## 2020-03-15 NOTE — Progress Notes (Signed)
Gynecology H&P  Chief Complaint:  Chief Complaint  Patient presents with  . Vaginal Bleeding    a lot of cramping    History of Present Illness: Patient is a 54 y.o.  presents evaluation of postmenopausal bleeding. The patient states she has had a single episode(s) of bleeding in the past month  The most recent episode occurred 1 week ago and is ongoing.  The bleeding has been has been approximately equal to menses at its heaviest . She describes the blood as bright red to dark brown in appearance.  She has had no additional complaints. She deniesbloating, cramping, early satiety and abdominal pain. She does not have a history of abnormal pap smears .  T There are no other aggravating factors reported. There are no alleviating factors reported. The patient's past medical history is noncontributory.  No inciting events noted  She hashad prior work up for postmenopausal bleeding. TVUS at [facility] ER read as consistent with hematometra.    Review of Systems: 10 point review of systems negative unless otherwise noted in HPI  Past Medical History:  Patient Active Problem List   Diagnosis Date Noted  . Chronic pain syndrome 12/14/2019  . Lumbar spondylosis 05/01/2019    Type: Therapeutic Medial Branch Facet Block with steroid #3 (#2 done 07/22/2017) Region: Lumbar Level: L2/3, L3/4, L4/5 Laterality: Bilateral  Helped significantly, right greater than left.  Can repeat as needed.   . Cervicalgia 05/01/2019  . Encounter for screening colonoscopy   . Polyp of sigmoid colon   . GAD (generalized anxiety disorder) 02/28/2019  . Panic disorder 02/28/2019  . Insomnia due to mental condition 02/28/2019  . Morbid obesity ([place]) 07/26/2017  . Spondylolisthesis of lumbosacral region 02/11/2016  . Hematuria 04/09/2015  . Hyperglycemia 04/06/2015  . Right lumbar radiculitis 12/28/2014  . MDD (major depressive disorder), recurrent episode, moderate ([place]) 12/27/2014  . Gastro-esophageal reflux disease  without esophagitis 12/27/2014  . Bulge of lumbar disc without myelopathy 12/27/2014  . Dysmetabolic syndrome 12/45/8099  . Migraine without aura and responsive to treatment 12/27/2014  . Osteoarthrosis 12/27/2014  . Obesity (BMI 30-39.9) 12/27/2014  . Restless leg 12/27/2014  . Allergic rhinitis, seasonal 12/27/2014    Past Surgical History:  Past Surgical History:  Procedure Laterality Date  . BACK SURGERY    . COLONOSCOPY WITH PROPOFOL N/A 02/03/2019   Procedure: COLONOSCOPY WITH PROPOFOL;  Surgeon: Jonathon Bellows, MD;  Location: [facility] ENDOSCOPY;  Service: Gastroenterology;  Laterality: N/A;  . COLONOSCOPY WITH PROPOFOL N/A 03/10/2019   Procedure: COLONOSCOPY WITH PROPOFOL;  Surgeon: Jonathon Bellows, MD;  Location: [facility] ENDOSCOPY;  Service: Gastroenterology;  Laterality: N/A;  . DIAGNOSTIC LAPAROSCOPY    . DILATION AND CURETTAGE OF UTERUS    . KNEE ARTHROSCOPY WITH MEDIAL MENISECTOMY Left 01/14/2017   Procedure: KNEE ARTHROSCOPY WITH MEDIAL AND LATERAL MENISECTOMY;  Surgeon: Hessie Knows, MD;  Location: [facility] ORS;  Service: Orthopedics;  Laterality: Left;  Partial Knee menisectomy  . KNEE SURGERY Left 05/2011   Dr. Rudene Christians- Arthroscopic  . LUMBAR LAMINECTOMY/DECOMPRESSION MICRODISCECTOMY N/A 09/16/2016   Procedure: LUMBAR LAMINECTOMY/DECOMPRESSION MICRODISCECTOMY 1 LEVEL L5-S1;  Surgeon: Meade Maw, MD;  Location: [facility] ORS;  Service: Neurosurgery;  Laterality: N/A;  . TONSILLECTOMY AND ADENOIDECTOMY    . URETHRAL STRICTURE DILATATION      Family History:  Family History  Problem Relation Age of Onset  . Cancer Paternal Grandmother   . Anxiety disorder Cousin   . Depression Cousin     Social History:  Social History  Socioeconomic History  . Marital status: Married    Spouse name: Jenny Reichmann  . Number of children: 2  . Years of education: 28  . Highest education level: Associate degree: occupational, Hotel manager, or vocational program  Occupational History  . Occupation: not  employed  Tobacco Use  . Smoking status: Former Smoker    Packs/day: 0.75    Years: 2.00    Pack years: 1.50    Types: Cigarettes    Start date: 07/06/1981    Quit date: 07/07/1983    Years since quitting: 36.7  . Smokeless tobacco: Never Used  Vaping Use  . Vaping Use: Never used  Substance and Sexual Activity  . Alcohol use: No    Alcohol/week: 0.0 standard drinks  . Drug use: No  . Sexual activity: Yes    Partners: Male  Other Topics Concern  . Not on file  Social History Narrative   Married and husband has a lot of medical problems, he is working again    Investment banker, operational of Radio broadcast assistant Strain: Medium Risk  . Difficulty of Paying Living Expenses: Somewhat hard  Food Insecurity: No Food Insecurity  . Worried About Charity fundraiser in the Last Year: Never true  . Ran Out of Food in the Last Year: Never true  Transportation Needs: No Transportation Needs  . Lack of Transportation (Medical): No  . Lack of Transportation (Non-Medical): No  Physical Activity:   . Days of Exercise per Week: Not on file  . Minutes of Exercise per Session: Not on file  Stress: Stress Concern Present  . Feeling of Stress : To some extent  Social Connections: Socially Integrated  . Frequency of Communication with Friends and Family: More than three times a week  . Frequency of Social Gatherings with Friends and Family: More than three times a week  . Attends Religious Services: More than 4 times per year  . Active Member of Clubs or Organizations: Yes  . Attends Archivist Meetings: More than 4 times per year  . Marital Status: Married  Human resources officer Violence: Not At Risk  . Fear of Current or Ex-Partner: No  . Emotionally Abused: No  . Physically Abused: No  . Sexually Abused: No    Allergies:  Allergies  Allergen Reactions  . Penicillins Anaphylaxis, Hives, Nausea And Vomiting and Swelling    Has patient had a PCN reaction causing immediate rash,  facial/tongue/throat swelling, SOB or lightheadedness with hypotension: Yes Has patient had a PCN reaction causing severe rash involving mucus membranes or skin necrosis: Yes Has patient had a PCN reaction that required hospitalization No Has patient had a PCN reaction occurring within the last 10 years: Yes If all of the above answers are "NO", then may proceed with Cephalosporin use.   . Chlorhexidine Gluconate Itching and Rash  . Ciprofloxacin Hives  . Clindamycin/Lincomycin Hives  . Erythromycin Hives and Nausea And Vomiting  . Keflex [Cephalexin] Hives  . Lyrica [Pregabalin]     Dizziness, syncope  . Nitrofurantoin Monohyd Macro Hives and Nausea And Vomiting  . Other Hives  . Sulfa Antibiotics Hives and Nausea And Vomiting    Rapid heart rate  . Tetracyclines & Related Hives  . Adhesive [Tape] Rash  . Latex Rash  . Vancomycin Itching and Rash    Medications: Prior to Admission medications   Medication Sig Start Date End Date Taking? Authorizing Provider  busPIRone (BUSPAR) 7.5 MG tablet Take 1 tablet (7.5 mg total)  by mouth daily. 01/29/20  Yes Eappen, Ria Clock, MD  fluticasone (FLONASE) 50 MCG/ACT nasal spray Place 2 sprays into both nostrils daily. 10/12/19  Yes Sowles, Drue Stager, MD  gabapentin (NEURONTIN) 300 MG capsule 300 mg qAM, 600 mg QHS 01/29/20  Yes Gillis Santa, MD  hydrOXYzine (ATARAX/VISTARIL) 25 MG tablet Take 1 tablet (25 mg total) by mouth 2 (two) times daily as needed for anxiety. 01/29/20  Yes Ursula Alert, MD  loratadine (CLARITIN) 10 MG tablet Take 1 tablet (10 mg total) by mouth daily. 10/12/19  Yes Sowles, Drue Stager, MD  mirtazapine (REMERON) 7.5 MG tablet Take 1 tablet (7.5 mg total) by mouth at bedtime. For sleep and anxiety 12/08/19  Yes Eappen, Saramma, MD  montelukast (SINGULAIR) 10 MG tablet Take 1 tablet (10 mg total) by mouth at bedtime. 10/12/19  Yes Sowles, Drue Stager, MD  omeprazole (PRILOSEC) 40 MG capsule TAKE ONE CAPSULE BY MOUTH EVERY MORNING 01/24/20  Yes  Sowles, Drue Stager, MD  rOPINIRole (REQUIP) 1 MG tablet TAKE ONE TABLET BY MOUTH EVERY NIGHT AT BEDTIME 01/24/20  Yes Sowles, Drue Stager, MD  SUMAtriptan (IMITREX) 100 MG tablet Take 1 tablet (100 mg total) by mouth every 2 (two) hours as needed for migraine. May repeat in 2 hours if headache persists or recurs. 06/20/19  Yes Sowles, Drue Stager, MD  tizanidine (ZANAFLEX) 2 MG capsule Take 1 capsule (2 mg total) by mouth daily as needed for muscle spasms. 10/12/19  Yes Sowles, Drue Stager, MD  topiramate (TOPAMAX) 100 MG tablet Take 1 tablet (100 mg total) by mouth every evening. 10/03/19  Yes Sowles, Drue Stager, MD  venlafaxine XR (EFFEXOR-XR) 150 MG 24 hr capsule Take 1 capsule (150 mg total) by mouth daily with breakfast. 01/29/20  Yes Eappen, Ria Clock, MD  Cholecalciferol 125 MCG (5000 UT) capsule Take by mouth. Patient not taking: Reported on 03/08/2020    [provider]    Physical Exam Vitals: Blood pressure 120/80, height 5\' 4"  (1.626 m), weight 227 lb (103 kg), last menstrual period 03/07/2020.  General: NAD HEENT: normocephalic, anicteric Neck: Thyroid non-enlarged, no nodules Pulmonary: No increased work of breathing Abdomen: NABS, soft, non-tender, non-distended.  Umbilicus without lesions.  No hepatomegaly, splenomegaly or masses palpable. No evidence of hernia  Genitourinary:  External: Normal external female genitalia.  Normal urethral meatus, normal Bartholin's and Skene's glands.    Vagina: Normal vaginal mucosa, no evidence of prolapse.    Cervix: Grossly normal in appearance other than stenotic cervical os noted, minimal dark brown blood noted at os  Uterus: Non-enlarged, mobile, normal contour.  No CMT  Adnexa: ovaries non-enlarged, no adnexal masses  Rectal: deferred Extremities: no edema, erythema, or tenderness Neurologic: Grossly intact Psychiatric: mood appropriate, affect full   ENDOMETRIAL BIOPSY     The indications for endometrial biopsy were reviewed.   Risks of the  biopsy including cramping, bleeding, infection, uterine perforation, inadequate specimen and need for additional procedures  were discussed. The patient states she understands and agrees to undergo procedure today. Consent was signed. Time out was performed. Urine HCG was negative. A Graves speculum was placed and the cervix was brought into view.  The cervix was prepped with Betadine. A single-toothed tenaculum was placed on the anterior lip of the cervix for traction. The cervix was successful dilated using pratt dilators.  A 3 mm pipelle was introduced through the cervix into the endometrial cavity without difficulty to a depth of 57m, and a small amount of tissue was obtained, the resulting specime sent to pathology. The instruments were  removed from the patient's vagina. Minimal bleeding from the cervix was noted. The patient tolerated the procedure well. Routine post-procedure instructions were given to the patient.  She will be contacted by phone one results become available.     Assessment: 54 y.o. No obstetric history on file. presenting for evaluation of postmenopausal bleeding  Plan: Problem List Items Addressed This Visit    None    Visit Diagnoses    Screening for malignant neoplasm of cervix    -  Primary   Relevant Orders   Cytology - PAP   Postmenopausal bleeding       Relevant Orders   Surgical pathology   US Transvaginal Non-OB      1) We discussed that menopause is a clinical diagnosis made after 12 months of amenorrhea.  The average age of menopause in the  [place] population is 20 but there may be significant variation.  Any bleeding that happens after a 12 month period of amenorrhea warrants further work.  Possible etiologies of postmenopausal bleeding were discussed with the patient today.  These may range from benign etiologies such as urethral prolapse and atrophy, to indeterminate lesions such as submucosal fibroids or polyps which would require resection to accurately  evaluate. The role of unopposed estrogen in the development of  dndometrial hyperplasia or carcinoma is discussed.  The risk of endometrial hyperplasia is linearly correlated with increasing BMI given the production of estrone by adipose tissue.  Work up will be include transvaginal ultrasound to assess the thickness of the endometrial lining as well as to assess for focal uterine lesions.  Negative ultrasound evaluation, defined as the absence of focal lesions and endometrial stripe of <87mm, effectively rules out carcinoma and confirms atrophy as the most likely etiology.  Should focal lesions be present these generally require hysteroscopic resection.  Should lining be greater >53mm endometrial biopsy is warranted to rule out hyperplasia or frank endometrial cancer.  Continued episodes of bleeding despite negative ultrasound also warrant endometrial sampling.  As the cervical pathology may also be implicated in postmenopausal bleeding prior cervical cytology was reviewed and repeated if required per ASCCP guidelines.   2) Cervix was successfully dilated today in office, endometrial biopsy obtained.  Will follow up to verify finding of hematometra resolved on on follow up imaging  3) Return in about 2 weeks (around 03/29/2020) for GYN and TVUS follow up.   Malachy Mood, MD, [facility], [facility] 03/15/2020, 2:00 PM

## 2020-03-18 DIAGNOSIS — R87610 Atypical squamous cells of undetermined significance on cytologic smear of cervix (ASC-US): Secondary | ICD-10-CM | POA: Diagnosis not present

## 2020-03-19 LAB — CYTOLOGY - PAP
Comment: NEGATIVE
Diagnosis: UNDETERMINED — AB
High risk HPV: NEGATIVE

## 2020-03-20 ENCOUNTER — Encounter: Payer: Self-pay | Admitting: Psychiatry

## 2020-03-20 ENCOUNTER — Telehealth (INDEPENDENT_AMBULATORY_CARE_PROVIDER_SITE_OTHER): Payer: BC Managed Care – PPO | Admitting: Psychiatry

## 2020-03-20 ENCOUNTER — Other Ambulatory Visit: Payer: Self-pay

## 2020-03-20 DIAGNOSIS — F411 Generalized anxiety disorder: Secondary | ICD-10-CM | POA: Diagnosis not present

## 2020-03-20 DIAGNOSIS — F5105 Insomnia due to other mental disorder: Secondary | ICD-10-CM | POA: Diagnosis not present

## 2020-03-20 DIAGNOSIS — F410 Panic disorder [episodic paroxysmal anxiety] without agoraphobia: Secondary | ICD-10-CM

## 2020-03-20 DIAGNOSIS — F331 Major depressive disorder, recurrent, moderate: Secondary | ICD-10-CM

## 2020-03-20 MED ORDER — MIRTAZAPINE 7.5 MG PO TABS: 7.5000 mg | ORAL_TABLET | Freq: Every day | ORAL | 0 refills | Status: DC

## 2020-03-20 MED ORDER — BUSPIRONE HCL 7.5 MG PO TABS: 7.5000 mg | ORAL_TABLET | Freq: Every day | ORAL | 0 refills | Status: DC

## 2020-03-20 MED ORDER — VENLAFAXINE HCL ER 37.5 MG PO CP24: 37.5000 mg | ORAL_CAPSULE | Freq: Every day | ORAL | 0 refills | Status: DC

## 2020-03-20 NOTE — Progress Notes (Signed)
Provider Location : [facility] Patient Location : Home  Participants: Patient , Provider  Virtual Visit via Video Note  I connected with Kelly [name] on 03/20/20 at  2:30 PM EDT by a video enabled telemedicine application and verified that I am speaking with the correct person using two identifiers.   I discussed the limitations of evaluation and management by telemedicine and the availability of in person appointments. The patient expressed understanding and agreed to proceed.   I discussed the assessment and treatment plan with the patient. The patient was provided an opportunity to ask questions and all were answered. The patient agreed with the plan and demonstrated an understanding of the instructions.   The patient was advised to call back or seek an in-person evaluation if the symptoms worsen or if the condition fails to improve as anticipated.   [facility] MD/PA/NP OP Progress Note  03/20/2020 4:31 PM Kelly [name]  MRN:  [id number]  Chief Complaint:  Chief Complaint    Follow-up     HPI: Kelly [name] is a 54 year old Caucasian female, who is married, lives in [place] has a history of depression, anxiety, insomnia, gastroesophageal reflux disease, lumbar disc problem, migraine headaches was evaluated by telemedicine today.  Patient today reports that she had some recent abdominal pain and was recently evaluated at the emergency department.  She currently has vaginal bleeding and is currently following up with her GYN as well as her primary care provider.  She reports she had imaging as well as Pap smear done.  She is waiting for her Pap report.  She reports her health problems does make her anxious and lethargic.  Initially she is struggled during the day and slept a lot.  She however reports currently she is able to push herself and is able to do more things or even though she is taking naps sporadically.  She does feel depressed on and off however overall she is coping  okay.  She however is interested in increasing her venlafaxine dosage.  The dosage was reduced previously since she was feeling sluggish during the day.  She however reports reducing the dosage did not help much since her other health issues could be making her sluggish and she would like to get the higher dosage of try again.  She continues to follow-up with her therapist Malachy Mood, reports therapy sessions is beneficial.  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  She denies any suicidality, homicidality or perceptual disturbances.  She is compliant on her medications and denies side effects.  Visit Diagnosis:    ICD-10-CM   1. MDD (major depressive disorder), recurrent episode, moderate (HCC)  F33.1 venlafaxine XR (EFFEXOR-XR) 37.5 MG 24 hr capsule  2. GAD (generalized anxiety disorder)  F41.1 venlafaxine XR (EFFEXOR-XR) 37.5 MG 24 hr capsule    mirtazapine (REMERON) 7.5 MG tablet    busPIRone (BUSPAR) 7.5 MG tablet  3. Panic disorder  F41.0 venlafaxine XR (EFFEXOR-XR) 37.5 MG 24 hr capsule    mirtazapine (REMERON) 7.5 MG tablet    busPIRone (BUSPAR) 7.5 MG tablet  4. Insomnia due to mental condition  F51.05 mirtazapine (REMERON) 7.5 MG tablet    Past Psychiatric History: I have reviewed past psychiatric history from my progress note on 10/25/2018.  Past trials of medications like Paxil, trazodone, hydroxyzine.  Patient completed IOP-10/03/2019  Past Medical History:  Past Medical History:  Diagnosis Date  . Allergy   . Anxiety   . Depression   . GERD (gastroesophageal  reflux disease)   . History of kidney stones   . Insomnia   . Intermittent low back pain   . Migraines   . Osteoarthritis   . Recurrent UTI   . Restless leg syndrome   . Sciatica of right side   . Symptomatic menopausal or female climacteric states   . Vertigo   . Vitamin D deficiency     Past Surgical History:  Procedure Laterality Date  . BACK SURGERY    . COLONOSCOPY WITH PROPOFOL N/A  02/03/2019   Procedure: COLONOSCOPY WITH PROPOFOL;  Surgeon: Jonathon Bellows, MD;  Location: [facility] ENDOSCOPY;  Service: Gastroenterology;  Laterality: N/A;  . COLONOSCOPY WITH PROPOFOL N/A 03/10/2019   Procedure: COLONOSCOPY WITH PROPOFOL;  Surgeon: Jonathon Bellows, MD;  Location: [facility] ENDOSCOPY;  Service: Gastroenterology;  Laterality: N/A;  . DIAGNOSTIC LAPAROSCOPY    . DILATION AND CURETTAGE OF UTERUS    . KNEE ARTHROSCOPY WITH MEDIAL MENISECTOMY Left 01/14/2017   Procedure: KNEE ARTHROSCOPY WITH MEDIAL AND LATERAL MENISECTOMY;  Surgeon: Hessie Knows, MD;  Location: [facility] ORS;  Service: Orthopedics;  Laterality: Left;  Partial Knee menisectomy  . KNEE SURGERY Left 05/2011   Dr. Rudene Christians- Arthroscopic  . LUMBAR LAMINECTOMY/DECOMPRESSION MICRODISCECTOMY N/A 09/16/2016   Procedure: LUMBAR LAMINECTOMY/DECOMPRESSION MICRODISCECTOMY 1 LEVEL L5-S1;  Surgeon: Meade Maw, MD;  Location: [facility] ORS;  Service: Neurosurgery;  Laterality: N/A;  . TONSILLECTOMY AND ADENOIDECTOMY    . URETHRAL STRICTURE DILATATION      Family Psychiatric History: I have reviewed family psychiatric history from my progress note on 10/03/2019.  Family History:  Family History  Problem Relation Age of Onset  . Cancer Paternal Grandmother   . Anxiety disorder Cousin   . Depression Cousin     Social History: I have reviewed social history from my progress note on 10/03/2019. Social History   Socioeconomic History  . Marital status: Married    Spouse name: Jenny Reichmann  . Number of children: 2  . Years of education: 26  . Highest education level: Associate degree: occupational, Hotel manager, or vocational program  Occupational History  . Occupation: not employed  Tobacco Use  . Smoking status: Former Smoker    Packs/day: 0.75    Years: 2.00    Pack years: 1.50    Types: Cigarettes    Start date: 07/06/1981    Quit date: 07/07/1983    Years since quitting: 36.7  . Smokeless tobacco: Never Used  Vaping Use  . Vaping Use: Never used   Substance and Sexual Activity  . Alcohol use: No    Alcohol/week: 0.0 standard drinks  . Drug use: No  . Sexual activity: Yes    Partners: Male  Other Topics Concern  . Not on file  Social History Narrative   Married and husband has a lot of medical problems, he is working again    Investment banker, operational of Radio broadcast assistant Strain: Medium Risk  . Difficulty of Paying Living Expenses: Somewhat hard  Food Insecurity: No Food Insecurity  . Worried About Charity fundraiser in the Last Year: Never true  . Ran Out of Food in the Last Year: Never true  Transportation Needs: No Transportation Needs  . Lack of Transportation (Medical): No  . Lack of Transportation (Non-Medical): No  Physical Activity:   . Days of Exercise per Week: Not on file  . Minutes of Exercise per Session: Not on file  Stress: Stress Concern Present  . Feeling of Stress : To some extent  Social  Connections: Socially Integrated  . Frequency of Communication with Friends and Family: More than three times a week  . Frequency of Social Gatherings with Friends and Family: More than three times a week  . Attends Religious Services: More than 4 times per year  . Active Member of Clubs or Organizations: Yes  . Attends Archivist Meetings: More than 4 times per year  . Marital Status: Married    Allergies:  Allergies  Allergen Reactions  . Penicillins Anaphylaxis, Hives, Nausea And Vomiting and Swelling    Has patient had a PCN reaction causing immediate rash, facial/tongue/throat swelling, SOB or lightheadedness with hypotension: Yes Has patient had a PCN reaction causing severe rash involving mucus membranes or skin necrosis: Yes Has patient had a PCN reaction that required hospitalization No Has patient had a PCN reaction occurring within the last 10 years: Yes If all of the above answers are "NO", then may proceed with Cephalosporin use.   . Chlorhexidine Gluconate Itching and Rash  .  Ciprofloxacin Hives  . Clindamycin/Lincomycin Hives  . Erythromycin Hives and Nausea And Vomiting  . Keflex [Cephalexin] Hives  . Lyrica [Pregabalin]     Dizziness, syncope  . Nitrofurantoin Monohyd Macro Hives and Nausea And Vomiting  . Other Hives  . Sulfa Antibiotics Hives and Nausea And Vomiting    Rapid heart rate  . Tetracyclines & Related Hives  . Adhesive [Tape] Rash  . Latex Rash  . Vancomycin Itching and Rash    Metabolic Disorder Labs: Lab Results  Component Value Date   HGBA1C 5.3 03/31/2019   MPG 105 03/31/2019   MPG 108 06/17/2018   No results found for: PROLACTIN Lab Results  Component Value Date   CHOL 160 06/17/2018   TRIG 119 06/17/2018   HDL 56 06/17/2018   CHOLHDL 2.9 06/17/2018   VLDL 22 12/31/2016   LDLCALC 82 06/17/2018   LDLCALC 64 12/31/2016   Lab Results  Component Value Date   TSH 1.75 03/31/2019   TSH 2.56 12/31/2016    Therapeutic Level Labs: No results found for: LITHIUM No results found for: VALPROATE No components found for:  CBMZ  Current Medications: Current Outpatient Medications  Medication Sig Dispense Refill  . busPIRone (BUSPAR) 7.5 MG tablet Take 1 tablet (7.5 mg total) by mouth daily. 90 tablet 0  . Cholecalciferol 125 MCG (5000 UT) capsule Take by mouth. (Patient not taking: Reported on 03/08/2020)    . fluticasone (FLONASE) 50 MCG/ACT nasal spray Place 2 sprays into both nostrils daily. 16 g 2  . gabapentin (NEURONTIN) 300 MG capsule 300 mg qAM, 600 mg QHS 90 capsule 2  . hydrOXYzine (ATARAX/VISTARIL) 25 MG tablet Take 1 tablet (25 mg total) by mouth 2 (two) times daily as needed for anxiety. 60 tablet 1  . loratadine (CLARITIN) 10 MG tablet Take 1 tablet (10 mg total) by mouth daily. 90 tablet 3  . mirtazapine (REMERON) 7.5 MG tablet Take 1 tablet (7.5 mg total) by mouth at bedtime. For sleep and anxiety 90 tablet 0  . montelukast (SINGULAIR) 10 MG tablet Take 1 tablet (10 mg total) by mouth at bedtime. 90 tablet 1  .  omeprazole (PRILOSEC) 40 MG capsule TAKE ONE CAPSULE BY MOUTH EVERY MORNING 90 capsule 0  . rOPINIRole (REQUIP) 1 MG tablet TAKE ONE TABLET BY MOUTH EVERY NIGHT AT BEDTIME 62 tablet 0  . SUMAtriptan (IMITREX) 100 MG tablet Take 1 tablet (100 mg total) by mouth every 2 (two) hours as needed for  migraine. May repeat in 2 hours if headache persists or recurs. 10 tablet 0  . tizanidine (ZANAFLEX) 2 MG capsule Take 1 capsule (2 mg total) by mouth daily as needed for muscle spasms. 90 capsule 0  . topiramate (TOPAMAX) 100 MG tablet Take 1 tablet (100 mg total) by mouth every evening. 90 tablet 1  . venlafaxine XR (EFFEXOR-XR) 150 MG 24 hr capsule Take 1 capsule (150 mg total) by mouth daily with breakfast. 90 capsule 1  . venlafaxine XR (EFFEXOR-XR) 37.5 MG 24 hr capsule Take 1 capsule (37.5 mg total) by mouth daily with breakfast. To be combined with 150 mg 90 capsule 0   No current facility-administered medications for this visit.     Musculoskeletal: Strength & Muscle Tone: UTA Gait & Station: normal Patient leans: N/A  Psychiatric Specialty Exam: Review of Systems  Constitutional: Positive for fatigue.  Gastrointestinal: Positive for abdominal pain.  Genitourinary: Positive for vaginal bleeding.  Musculoskeletal: Positive for back pain.  Psychiatric/Behavioral: Positive for dysphoric mood. The patient is nervous/anxious.   All other systems reviewed and are negative.   Last menstrual period 03/07/2020.There is no height or weight on file to calculate BMI.  General Appearance: Casual  Eye Contact:  Fair  Speech:  Clear and Coherent  Volume:  Normal  Mood:  Anxious and Depressed  Affect:  Congruent  Thought Process:  Goal Directed and Descriptions of Associations: Intact  Orientation:  Full (Time, Place, and Person)  Thought Content: Logical   Suicidal Thoughts:  No  Homicidal Thoughts:  No  Memory:  Immediate;   Fair Recent;   Fair Remote;   Fair  Judgement:  Fair  Insight:   Fair  Psychomotor Activity:  Normal  Concentration:  Concentration: Fair and Attention Span: Fair  Recall:  [employer] of Knowledge: Fair  Language: Fair  Akathisia:  No  Handed:  Right  AIMS (if indicated): UTA  Assets:  Communication Skills Desire for Improvement Housing Social Support  ADL's:  Intact  Cognition: WNL  Sleep:  Fair   Screenings: GAD-7     Office Visit from 10/12/2019 in [facility] Office Visit from 06/20/2019 in [facility] Office Visit from 03/23/2019 in [facility] Office Visit from 02/22/2019 in [facility] Office Visit from 09/20/2018 in [facility]  Total GAD-7 Score 15 2 1 1 11     PHQ2-9     Video Visit from 03/08/2020 in [facility] Procedure visit from 12/20/2019 in [place] Office Visit from 10/12/2019 in [facility] Office Visit from 06/20/2019 in [facility] Office Visit from 03/23/2019 in [facility]  PHQ-2 Total Score 1 0 6 1 0  PHQ-9 Total Score -- -- 20 6 2        Assessment and Plan: Kelly [name] is a 54 year old female who has a history of panic attacks, insomnia, arthritis, chronic pain, MDD, GAD was evaluated by telemedicine today.  Patient with psychosocial stressors of physical limitations, financial problems, health problems and pain.  Patient with psychosocial stressors of relationship struggles, son's mental health issues and custody battle of her grandchildren.  Patient is currently struggling with mood symptoms and will benefit from the following medication changes.  Plan as noted below.  Plan MDD-unstable Increase venlafaxine to 18 7.5 mg p.o. daily Mirtazapine 7.5 mg p.o. nightly  GAD-improving Venlafaxine as prescribed Remeron 7.5 mg p.o. nightly  BuSpar 7.5 mg p.o. daily Continue CBT  Panic  attacks-improving BuSpar 7.5 mg p.o. daily.  Dose reduced due to side effects. Hydroxyzine 25 mg p.o. twice daily as needed Continue CBT  Insomnia-improving Remeron 7.5 mg p.o. nightly for sleep.  Follow-up in clinic in 4 to 5 weeks or sooner if needed.  I have spent atleast 20 minutes face to face with patient today. More than 50 % of the time was spent for preparing to see the patient ( e.g., review of test, records ),  ordering medications and test ,psychoeducation and supportive psychotherapy and care coordination,as well as documenting clinical information in electronic health records. This note was generated in part or whole with voice recognition software. Voice recognition is usually quite accurate but there are transcription errors that can and very often do occur. I apologize for any typographical errors that were not detected and corrected.        Ursula Alert, MD 03/20/2020, 4:30 PM

## 2020-03-20 NOTE — Progress Notes (Signed)
Name: Kelly [name]   MRN: [id number]    DOB: [birth year]   Date:03/21/2020       Progress Note  Subjective  Chief Complaint  ER Follow-up  HPI    Post-menopause bleeding: she states LMP was 02/2019, she noticed  spotting on 03/08/2020  Followed by heavy bleeding went to [facility] but left to go to [place]. Reviewed labs and also CT scan, she was referral to GYN but already had an appointment with Dr. Star Age and had a pap smear that showed ASCUS but negative HPV, she also had an endometrium biopsy - results pending, she will go back on Monday for pelvic US and results follow up. She continues to bleeding but not constantly heavy, sometimes light  period, she still has cramping that is constant.   Dysuria: she didn't take antibiotics because she has multiple drug allergies,  she states urine still cloudy and has frequency, but no dysuria. We will send urine for culture.   Obesity: she has gained 21 lbs since last year, she is on Remeron, states not as mobile secondary to back and hip pain but is going back to see Dr. Holley Raring, she has discussed it with Dr. Cherlyn Cushing also.    Patient Active Problem List   Diagnosis Date Noted  . Chronic pain syndrome 12/14/2019  . Lumbar spondylosis 05/01/2019  . Cervicalgia 05/01/2019  . Encounter for screening colonoscopy   . Polyp of sigmoid colon   . GAD (generalized anxiety disorder) 02/28/2019  . Panic disorder 02/28/2019  . Insomnia due to mental condition 02/28/2019  . Morbid obesity ([place]) 07/26/2017  . Spondylolisthesis of lumbosacral region 02/11/2016  . Hematuria 04/09/2015  . Hyperglycemia 04/06/2015  . Right lumbar radiculitis 12/28/2014  . MDD (major depressive disorder), recurrent episode, moderate ([place]) 12/27/2014  . Gastro-esophageal reflux disease without esophagitis 12/27/2014  . Bulge of lumbar disc without myelopathy 12/27/2014  . Dysmetabolic syndrome 02/72/5366  . Migraine without aura and responsive to treatment 12/27/2014  .  Osteoarthrosis 12/27/2014  . Obesity (BMI 30-39.9) 12/27/2014  . Restless leg 12/27/2014  . Allergic rhinitis, seasonal 12/27/2014    Past Surgical History:  Procedure Laterality Date  . BACK SURGERY    . COLONOSCOPY WITH PROPOFOL N/A 02/03/2019   Procedure: COLONOSCOPY WITH PROPOFOL;  Surgeon: Jonathon Bellows, MD;  Location: [facility] ENDOSCOPY;  Service: Gastroenterology;  Laterality: N/A;  . COLONOSCOPY WITH PROPOFOL N/A 03/10/2019   Procedure: COLONOSCOPY WITH PROPOFOL;  Surgeon: Jonathon Bellows, MD;  Location: [facility] ENDOSCOPY;  Service: Gastroenterology;  Laterality: N/A;  . DIAGNOSTIC LAPAROSCOPY    . DILATION AND CURETTAGE OF UTERUS    . KNEE ARTHROSCOPY WITH MEDIAL MENISECTOMY Left 01/14/2017   Procedure: KNEE ARTHROSCOPY WITH MEDIAL AND LATERAL MENISECTOMY;  Surgeon: Hessie Knows, MD;  Location: [facility] ORS;  Service: Orthopedics;  Laterality: Left;  Partial Knee menisectomy  . KNEE SURGERY Left 05/2011   Dr. Rudene Christians- Arthroscopic  . LUMBAR LAMINECTOMY/DECOMPRESSION MICRODISCECTOMY N/A 09/16/2016   Procedure: LUMBAR LAMINECTOMY/DECOMPRESSION MICRODISCECTOMY 1 LEVEL L5-S1;  Surgeon: Meade Maw, MD;  Location: [facility] ORS;  Service: Neurosurgery;  Laterality: N/A;  . TONSILLECTOMY AND ADENOIDECTOMY    . URETHRAL STRICTURE DILATATION      Family History  Problem Relation Age of Onset  . Cancer Paternal Grandmother   . Anxiety disorder Cousin   . Depression Cousin     Social History   Tobacco Use  . Smoking status: Former Smoker    Packs/day: 0.75    Years: 2.00    Pack years: 1.50  Types: Cigarettes    Start date: 07/06/1981    Quit date: 07/07/1983    Years since quitting: 36.7  . Smokeless tobacco: Never Used  Substance Use Topics  . Alcohol use: No    Alcohol/week: 0.0 standard drinks     Current Outpatient Medications:  .  busPIRone (BUSPAR) 7.5 MG tablet, Take 1 tablet (7.5 mg total) by mouth daily., Disp: 90 tablet, Rfl: 0 .  fluticasone (FLONASE) 50 MCG/ACT nasal spray,  Place 2 sprays into both nostrils daily., Disp: 16 g, Rfl: 2 .  gabapentin (NEURONTIN) 300 MG capsule, 300 mg qAM, 600 mg QHS, Disp: 90 capsule, Rfl: 2 .  hydrOXYzine (ATARAX/VISTARIL) 25 MG tablet, Take 1 tablet (25 mg total) by mouth 2 (two) times daily as needed for anxiety., Disp: 60 tablet, Rfl: 1 .  loratadine (CLARITIN) 10 MG tablet, Take 1 tablet (10 mg total) by mouth daily., Disp: 90 tablet, Rfl: 3 .  mirtazapine (REMERON) 7.5 MG tablet, Take 1 tablet (7.5 mg total) by mouth at bedtime. For sleep and anxiety, Disp: 90 tablet, Rfl: 0 .  montelukast (SINGULAIR) 10 MG tablet, Take 1 tablet (10 mg total) by mouth at bedtime., Disp: 90 tablet, Rfl: 1 .  omeprazole (PRILOSEC) 40 MG capsule, TAKE ONE CAPSULE BY MOUTH EVERY MORNING, Disp: 90 capsule, Rfl: 0 .  rOPINIRole (REQUIP) 1 MG tablet, TAKE ONE TABLET BY MOUTH EVERY NIGHT AT BEDTIME, Disp: 62 tablet, Rfl: 0 .  SUMAtriptan (IMITREX) 100 MG tablet, Take 1 tablet (100 mg total) by mouth every 2 (two) hours as needed for migraine. May repeat in 2 hours if headache persists or recurs., Disp: 10 tablet, Rfl: 0 .  tizanidine (ZANAFLEX) 2 MG capsule, Take 1 capsule (2 mg total) by mouth daily as needed for muscle spasms., Disp: 90 capsule, Rfl: 0 .  topiramate (TOPAMAX) 100 MG tablet, Take 1 tablet (100 mg total) by mouth every evening., Disp: 90 tablet, Rfl: 1 .  venlafaxine XR (EFFEXOR-XR) 150 MG 24 hr capsule, Take 1 capsule (150 mg total) by mouth daily with breakfast., Disp: 90 capsule, Rfl: 1 .  venlafaxine XR (EFFEXOR-XR) 37.5 MG 24 hr capsule, Take 1 capsule (37.5 mg total) by mouth daily with breakfast. To be combined with 150 mg, Disp: 90 capsule, Rfl: 0  Allergies  Allergen Reactions  . Penicillins Anaphylaxis, Hives, Nausea And Vomiting and Swelling    Has patient had a PCN reaction causing immediate rash, facial/tongue/throat swelling, SOB or lightheadedness with hypotension: Yes Has patient had a PCN reaction causing severe rash  involving mucus membranes or skin necrosis: Yes Has patient had a PCN reaction that required hospitalization No Has patient had a PCN reaction occurring within the last 10 years: Yes If all of the above answers are "NO", then may proceed with Cephalosporin use.   . Chlorhexidine Gluconate Itching and Rash  . Ciprofloxacin Hives  . Clindamycin/Lincomycin Hives  . Erythromycin Hives and Nausea And Vomiting  . Keflex [Cephalexin] Hives  . Lyrica [Pregabalin]     Dizziness, syncope  . Nitrofurantoin Monohyd Macro Hives and Nausea And Vomiting  . Other Hives  . Sulfa Antibiotics Hives and Nausea And Vomiting    Rapid heart rate  . Tetracyclines & Related Hives  . Adhesive [Tape] Rash  . Latex Rash  . Vancomycin Itching and Rash    I personally reviewed active problem list, medication list, allergies, family history, social history, health maintenance with the patient/caregiver today.   ROS  Constitutional: Negative for fever ,  positive for weight change - gain .  Respiratory: Negative for cough and shortness of breath.   Cardiovascular: Negative for chest pain or palpitations.  Gastrointestinal: Negative for abdominal pain, no bowel changes.  Musculoskeletal: positive  for gait problem  When in a lot of pain - back  or joint swelling.  Skin: Negative for rash.  Neurological: Negative for dizziness, positive for intermittent  headache.  No other specific complaints in a complete review of systems (except as listed in HPI above).    Objective  Vitals:   03/21/20 1021  BP: 122/82  Pulse: (!) 112  Resp: 16  Temp: 98.1 F (36.7 C)  TempSrc: Oral  SpO2: 99%  Weight: 230 lb 8 oz (104.6 kg)  Height: 5\' 4"  (1.626 m)    Body mass index is 39.57 kg/m.  Physical Exam  Constitutional: Patient appears well-developed and well-nourished. Obese  No distress.  HEENT: head atraumatic, normocephalic, pupils equal and reactive to light, neck supple Cardiovascular: Normal rate,  regular rhythm and normal heart sounds.  No murmur heard. No BLE edema. Pulmonary/Chest: Effort normal and breath sounds normal. No respiratory distress. Abdominal: Soft.  There is no tenderness. Psychiatric: Patient has a normal mood and affect. behavior is normal. Judgment and thought content normal.  Recent Results (from the past 2160 hour(s))  CBC     Status: None   Collection Time: 03/09/20  4:49 PM  Result Value Ref Range   WBC 9.9 4.0 - 10.5 K/uL   RBC 4.54 3.87 - 5.11 MIL/uL   Hemoglobin 13.4 12.0 - 15.0 g/dL   HCT 41.5 36 - 46 %   MCV 91.4 80.0 - 100.0 fL   MCH 29.5 26.0 - 34.0 pg   MCHC 32.3 30.0 - 36.0 g/dL   RDW 12.5 11.5 - 15.5 %   Platelets 330 150 - 400 K/uL   nRBC 0.0 0.0 - 0.2 %    Comment: Performed at [facility], [address] [place], [place] [address]  Basic metabolic panel     Status: Abnormal   Collection Time: 03/09/20  4:49 PM  Result Value Ref Range   Sodium 138 135 - 145 mmol/L   Potassium 4.5 3.5 - 5.1 mmol/L   Chloride 108 98 - 111 mmol/L   CO2 24 22 - 32 mmol/L   Glucose, Bld 88 70 - 99 mg/dL    Comment: Glucose reference range applies only to samples taken after fasting for at least 8 hours.   BUN 13 6 - 20 mg/dL   Creatinine, Ser 0.72 0.44 - 1.00 mg/dL   Calcium 8.7 (L) 8.9 - 10.3 mg/dL   GFR calc non Af Amer >60 >60 mL/min   GFR calc Af Amer >60 >60 mL/min   Anion gap 6 5 - 15    Comment: Performed at [facility], [address] [place], [place] [address]  Urinalysis, Complete w Microscopic Urine, Clean Catch     Status: Abnormal   Collection Time: 03/09/20  4:49 PM  Result Value Ref Range   Color, Urine YELLOW (A) YELLOW   APPearance HAZY (A) CLEAR   Specific Gravity, Urine 1.013 1.005 - 1.030   pH 5.0 5.0 - 8.0   Glucose, UA NEGATIVE NEGATIVE mg/dL   Hgb urine dipstick SMALL (A) NEGATIVE   Bilirubin Urine NEGATIVE NEGATIVE   Ketones, ur NEGATIVE NEGATIVE mg/dL   Protein, ur NEGATIVE NEGATIVE mg/dL   Nitrite  NEGATIVE NEGATIVE   Leukocytes,Ua TRACE (A) NEGATIVE  RBC / HPF 0-5 0 - 5 RBC/hpf   WBC, UA 11-20 0 - 5 WBC/hpf   Bacteria, UA RARE (A) NONE SEEN   Squamous Epithelial / LPF 0-5 0 - 5   Mucus PRESENT     Comment: Performed at [facility], [address] [place], [place] [address]  Pregnancy, urine     Status: None   Collection Time: 03/09/20  4:49 PM  Result Value Ref Range   Preg Test, Ur NEGATIVE NEGATIVE    Comment: Performed at [facility], [address] [place], [place] [address]  Cytology - PAP     Status: Abnormal   Collection Time: 03/15/20  1:21 PM  Result Value Ref Range   High risk HPV Negative    Adequacy      Satisfactory for evaluation; transformation zone component PRESENT.   Diagnosis (A)     - Atypical squamous cells of undetermined significance (ASC-US)   Comment      Endometrial cells are present.  Clinical correlation and correlation   Comment with the LMP is recommended.    Comment Normal Reference Range HPV - Negative      PHQ2/9: Depression screen [facility] 2/9 03/21/2020 03/08/2020 12/20/2019 10/12/2019 06/20/2019  Decreased Interest 1 1 0 3 0  Down, Depressed, Hopeless 1 0 0 3 1  PHQ - 2 Score 2 1 0 6 1  Altered sleeping - - - 3 2  Tired, decreased energy - - - 2 2  Change in appetite - - - 3 0  Feeling bad or failure about yourself  - - - 2 1  Trouble concentrating - - - 2 0  Moving slowly or fidgety/restless - - - 1 0  Suicidal thoughts - - - 1 0  PHQ-9 Score - - - 20 6  Difficult doing work/chores - - - Somewhat difficult Somewhat difficult  Some recent data might be hidden    phq 9 is positive - sees Dr Cherlyn Cushing    Fall Risk: Fall Risk  03/21/2020 03/08/2020 12/20/2019 12/14/2019 10/12/2019  Falls in the past year? 0 0 0 0 0  Number falls in past yr: 0 0 - - 0  Comment - - - - -  Injury with Fall? 0 0 - - 0  Comment - - - - -  Risk for fall due to : - - - - -  Risk for fall due to: Comment - - - - -  Follow up - - - - -      Functional Status Survey: Is the patient deaf or have difficulty hearing?: Yes Does the patient have difficulty seeing, even when wearing glasses/contacts?: No Does the patient have difficulty concentrating, remembering, or making decisions?: Yes Does the patient have difficulty walking or climbing stairs?: Yes Does the patient have difficulty dressing or bathing?: Yes Does the patient have difficulty doing errands alone such as visiting a doctor's office or shopping?: Yes    Assessment & Plan  1. Cloudy urine  - Urine Culture  2. Post-menopausal bleeding   3. Severe major depression ([place])   4. Vitamin D deficiency  Continue supplementation   5. Dysmetabolic syndrome  - Hemoglobin A1c  6. Long-term use of high-risk medication  - COMPLETE METABOLIC PANEL WITH GFR - CBC with Differential/Platelet  7. Lipid screening  - Lipid panel

## 2020-03-21 ENCOUNTER — Encounter (INDEPENDENT_AMBULATORY_CARE_PROVIDER_SITE_OTHER): Payer: Self-pay

## 2020-03-21 ENCOUNTER — Ambulatory Visit (INDEPENDENT_AMBULATORY_CARE_PROVIDER_SITE_OTHER): Payer: BC Managed Care – PPO | Admitting: Family Medicine

## 2020-03-21 ENCOUNTER — Encounter: Payer: Self-pay | Admitting: Family Medicine

## 2020-03-21 VITALS — BP 122/82 | HR 112 | Temp 98.1°F | Resp 16 | Ht 64.0 in | Wt 230.5 lb

## 2020-03-21 DIAGNOSIS — Z79899 Other long term (current) drug therapy: Secondary | ICD-10-CM | POA: Diagnosis not present

## 2020-03-21 DIAGNOSIS — F322 Major depressive disorder, single episode, severe without psychotic features: Secondary | ICD-10-CM

## 2020-03-21 DIAGNOSIS — R300 Dysuria: Secondary | ICD-10-CM | POA: Diagnosis not present

## 2020-03-21 DIAGNOSIS — N950 Postmenopausal bleeding: Secondary | ICD-10-CM | POA: Diagnosis not present

## 2020-03-21 DIAGNOSIS — Z13220 Encounter for screening for lipoid disorders: Secondary | ICD-10-CM

## 2020-03-21 DIAGNOSIS — E8881 Metabolic syndrome: Secondary | ICD-10-CM

## 2020-03-21 DIAGNOSIS — R8290 Unspecified abnormal findings in urine: Secondary | ICD-10-CM

## 2020-03-21 DIAGNOSIS — E559 Vitamin D deficiency, unspecified: Secondary | ICD-10-CM | POA: Diagnosis not present

## 2020-03-23 LAB — COMPLETE METABOLIC PANEL WITH GFR
AG Ratio: 2 (calc) (ref 1.0–2.5)
ALT: 10 U/L (ref 6–29)
AST: 12 U/L (ref 10–35)
Albumin: 4.3 g/dL (ref 3.6–5.1)
Alkaline phosphatase (APISO): 69 U/L (ref 37–153)
BUN: 17 mg/dL (ref 7–25)
CO2: 24 mmol/L (ref 20–32)
Calcium: 9.4 mg/dL (ref 8.6–10.4)
Chloride: 108 mmol/L (ref 98–110)
Creat: 0.84 mg/dL (ref 0.50–1.05)
GFR, Est African American: 91 mL/min/{1.73_m2} (ref >=60)
GFR, Est Non African American: 79 mL/min/{1.73_m2} (ref >=60)
Globulin: 2.2 g/dL (calc) (ref 1.9–3.7)
Glucose, Bld: 92 mg/dL (ref 65–99)
Potassium: 5.4 mmol/L — ABNORMAL HIGH (ref 3.5–5.3)
Sodium: 139 mmol/L (ref 135–146)
Total Bilirubin: 0.3 mg/dL (ref 0.2–1.2)
Total Protein: 6.5 g/dL (ref 6.1–8.1)

## 2020-03-23 LAB — CBC WITH DIFFERENTIAL/PLATELET
Absolute Monocytes: 561 cells/uL (ref 200–950)
Basophils Absolute: 80 cells/uL (ref 0–200)
Basophils Relative: 0.9 %
Eosinophils Absolute: 320 cells/uL (ref 15–500)
Eosinophils Relative: 3.6 %
HCT: 41.2 % (ref 35.0–45.0)
Hemoglobin: 13.2 g/dL (ref 11.7–15.5)
Lymphs Abs: 2412 cells/uL (ref 850–3900)
MCH: 29.2 pg (ref 27.0–33.0)
MCHC: 32 g/dL (ref 32.0–36.0)
MCV: 91.2 fL (ref 80.0–100.0)
MPV: 10.5 fL (ref 7.5–12.5)
Monocytes Relative: 6.3 %
Neutro Abs: 5527 cells/uL (ref 1500–7800)
Neutrophils Relative %: 62.1 %
Platelets: 330 10*3/uL (ref 140–400)
RBC: 4.52 10*6/uL (ref 3.80–5.10)
RDW: 12.2 % (ref 11.0–15.0)
Total Lymphocyte: 27.1 %
WBC: 8.9 10*3/uL (ref 3.8–10.8)

## 2020-03-23 LAB — HEMOGLOBIN A1C
Hgb A1c MFr Bld: 5.6 % of total Hgb (ref <5.7)
Mean Plasma Glucose: 114 (calc)
eAG (mmol/L): 6.3 (calc)

## 2020-03-23 LAB — URINE CULTURE
MICRO NUMBER:: 10960540
SPECIMEN QUALITY:: ADEQUATE

## 2020-03-23 LAB — LIPID PANEL
Cholesterol: 179 mg/dL (ref <200)
HDL: 59 mg/dL (ref >=50)
LDL Cholesterol (Calc): 95 mg/dL (calc)
Non-HDL Cholesterol (Calc): 120 mg/dL (calc) (ref <130)
Total CHOL/HDL Ratio: 3 (calc) (ref <5.0)
Triglycerides: 146 mg/dL (ref <150)

## 2020-03-24 ENCOUNTER — Encounter: Payer: Self-pay | Admitting: Family Medicine

## 2020-03-25 ENCOUNTER — Ambulatory Visit (INDEPENDENT_AMBULATORY_CARE_PROVIDER_SITE_OTHER): Payer: BC Managed Care – PPO

## 2020-03-25 ENCOUNTER — Encounter: Payer: Self-pay | Admitting: Obstetrics and Gynecology

## 2020-03-25 ENCOUNTER — Other Ambulatory Visit: Payer: Self-pay | Admitting: Obstetrics and Gynecology

## 2020-03-25 ENCOUNTER — Other Ambulatory Visit: Payer: Self-pay

## 2020-03-25 ENCOUNTER — Ambulatory Visit (INDEPENDENT_AMBULATORY_CARE_PROVIDER_SITE_OTHER): Payer: BC Managed Care – PPO | Admitting: Obstetrics and Gynecology

## 2020-03-25 VITALS — BP 132/84 | Wt 231.0 lb

## 2020-03-25 DIAGNOSIS — N857 Hematometra: Secondary | ICD-10-CM | POA: Diagnosis not present

## 2020-03-25 DIAGNOSIS — N950 Postmenopausal bleeding: Secondary | ICD-10-CM

## 2020-03-25 DIAGNOSIS — N882 Stricture and stenosis of cervix uteri: Secondary | ICD-10-CM | POA: Diagnosis not present

## 2020-03-25 NOTE — Patient Instructions (Signed)
Definition of recurrent urinary tract infections is 2 culture proven episodes in 6 months, or 3 culture proven episodes in 1 year with individual episodes separated by at least two weeks or documentation of differing urinary tract pathogen.  Relapse of urinary tract infection is symptoms within 2 weeks of adequate treatment, or recurrence of symptoms within 2 weeks and documentation of the same causative urinary tract pathogen.      Museum/gallery exhibitions officer Society Best-Practice Statement: Recurrent Urinary Tract Infection in Adult Women" Female Pelvic Medicine & Reconstructive Surgery Volume 24, Number 5, September/October 2018

## 2020-03-25 NOTE — Progress Notes (Signed)
Gynecology Ultrasound Follow Up  Chief Complaint:  Chief Complaint  Patient presents with  . Follow-up    GYN Ultrasound     History of Present Illness: Patient is a 54 y.o. female who presents today for ultrasound evaluation of PMB with hematometra.  Still some light bleeding since 03/15/2020 visit and some mild cramping but less.  Also E. Coli UTI pending ID referral because of multiple antibiotic allergies.    Ultrasound demonstrates the following findgins Adnexa: no adnexal masses Uterus: Non-enlarged, no fibroids with endometrial stripe 32mm Additional: no free fluid  Review of Systems: Review of Systems  Constitutional: Negative.   Gastrointestinal: Positive for abdominal pain.    Past Medical History:  Past Medical History:  Diagnosis Date  . Allergy   . Anxiety   . Depression   . GERD (gastroesophageal reflux disease)   . History of kidney stones   . Insomnia   . Intermittent low back pain   . Migraines   . Osteoarthritis   . Recurrent UTI   . Restless leg syndrome   . Sciatica of right side   . Symptomatic menopausal or female climacteric states   . Vertigo   . Vitamin D deficiency     Past Surgical History:  Past Surgical History:  Procedure Laterality Date  . BACK SURGERY    . COLONOSCOPY WITH PROPOFOL N/A 02/03/2019   Procedure: COLONOSCOPY WITH PROPOFOL;  Surgeon: Jonathon Bellows, MD;  Location: [facility] ENDOSCOPY;  Service: Gastroenterology;  Laterality: N/A;  . COLONOSCOPY WITH PROPOFOL N/A 03/10/2019   Procedure: COLONOSCOPY WITH PROPOFOL;  Surgeon: Jonathon Bellows, MD;  Location: [facility] ENDOSCOPY;  Service: Gastroenterology;  Laterality: N/A;  . DIAGNOSTIC LAPAROSCOPY    . DILATION AND CURETTAGE OF UTERUS    . KNEE ARTHROSCOPY WITH MEDIAL MENISECTOMY Left 01/14/2017   Procedure: KNEE ARTHROSCOPY WITH MEDIAL AND LATERAL MENISECTOMY;  Surgeon: Hessie Knows, MD;  Location: [facility] ORS;  Service: Orthopedics;  Laterality: Left;  Partial Knee menisectomy  . KNEE  SURGERY Left 05/2011   Dr. Rudene Christians- Arthroscopic  . LUMBAR LAMINECTOMY/DECOMPRESSION MICRODISCECTOMY N/A 09/16/2016   Procedure: LUMBAR LAMINECTOMY/DECOMPRESSION MICRODISCECTOMY 1 LEVEL L5-S1;  Surgeon: Meade Maw, MD;  Location: [facility] ORS;  Service: Neurosurgery;  Laterality: N/A;  . TONSILLECTOMY AND ADENOIDECTOMY    . URETHRAL STRICTURE DILATATION      Gynecologic History:  Patient's last menstrual period was 03/07/2020. Contraception: post menopausal status Last Pap: 03/15/2020 Results were: .ASCUS with NEGATIVE high risk HPV  Family History:  Family History  Problem Relation Age of Onset  . Cancer Paternal Grandmother   . Anxiety disorder Cousin   . Depression Cousin     Social History:  Social History   Socioeconomic History  . Marital status: Married    Spouse name: Jenny Reichmann  . Number of children: 2  . Years of education: 58  . Highest education level: Associate degree: occupational, Hotel manager, or vocational program  Occupational History  . Occupation: not employed  Tobacco Use  . Smoking status: Former Smoker    Packs/day: 0.75    Years: 2.00    Pack years: 1.50    Types: Cigarettes    Start date: 07/06/1981    Quit date: 07/07/1983    Years since quitting: 36.7  . Smokeless tobacco: Never Used  Vaping Use  . Vaping Use: Never used  Substance and Sexual Activity  . Alcohol use: No    Alcohol/week: 0.0 standard drinks  . Drug use: No  . Sexual activity: Yes  Partners: Male  Other Topics Concern  . Not on file  Social History Narrative   Married and husband has a lot of medical problems, he is working again    Investment banker, operational of Radio broadcast assistant Strain: Medium Risk  . Difficulty of Paying Living Expenses: Somewhat hard  Food Insecurity: No Food Insecurity  . Worried About Charity fundraiser in the Last Year: Never true  . Ran Out of Food in the Last Year: Never true  Transportation Needs: No Transportation Needs  . Lack of Transportation  (Medical): No  . Lack of Transportation (Non-Medical): No  Physical Activity:   . Days of Exercise per Week: Not on file  . Minutes of Exercise per Session: Not on file  Stress: Stress Concern Present  . Feeling of Stress : To some extent  Social Connections: Socially Integrated  . Frequency of Communication with Friends and Family: More than three times a week  . Frequency of Social Gatherings with Friends and Family: More than three times a week  . Attends Religious Services: More than 4 times per year  . Active Member of Clubs or Organizations: Yes  . Attends Archivist Meetings: More than 4 times per year  . Marital Status: Married  Human resources officer Violence: Not At Risk  . Fear of Current or Ex-Partner: No  . Emotionally Abused: No  . Physically Abused: No  . Sexually Abused: No    Allergies:  Allergies  Allergen Reactions  . Penicillins Anaphylaxis, Hives, Nausea And Vomiting and Swelling    Has patient had a PCN reaction causing immediate rash, facial/tongue/throat swelling, SOB or lightheadedness with hypotension: Yes Has patient had a PCN reaction causing severe rash involving mucus membranes or skin necrosis: Yes Has patient had a PCN reaction that required hospitalization No Has patient had a PCN reaction occurring within the last 10 years: Yes If all of the above answers are "NO", then may proceed with Cephalosporin use.   . Chlorhexidine Gluconate Itching and Rash  . Ciprofloxacin Hives  . Clindamycin/Lincomycin Hives  . Erythromycin Hives and Nausea And Vomiting  . Keflex [Cephalexin] Hives  . Lyrica [Pregabalin]     Dizziness, syncope  . Nitrofurantoin Monohyd Macro Hives and Nausea And Vomiting  . Other Hives  . Sulfa Antibiotics Hives and Nausea And Vomiting    Rapid heart rate  . Tetracyclines & Related Hives  . Adhesive [Tape] Rash  . Latex Rash  . Vancomycin Itching and Rash    Medications: Prior to Admission medications   Medication Sig  Start Date End Date Taking? Authorizing Provider  busPIRone (BUSPAR) 7.5 MG tablet Take 1 tablet (7.5 mg total) by mouth daily. 03/20/20   Ursula Alert, MD  fluticasone (FLONASE) 50 MCG/ACT nasal spray Place 2 sprays into both nostrils daily. 10/12/19   Steele Sizer, MD  gabapentin (NEURONTIN) 300 MG capsule 300 mg qAM, 600 mg QHS 01/29/20   Gillis Santa, MD  hydrOXYzine (ATARAX/VISTARIL) 25 MG tablet Take 1 tablet (25 mg total) by mouth 2 (two) times daily as needed for anxiety. 01/29/20   Ursula Alert, MD  loratadine (CLARITIN) 10 MG tablet Take 1 tablet (10 mg total) by mouth daily. 10/12/19   Steele Sizer, MD  mirtazapine (REMERON) 7.5 MG tablet Take 1 tablet (7.5 mg total) by mouth at bedtime. For sleep and anxiety 03/20/20   Ursula Alert, MD  montelukast (SINGULAIR) 10 MG tablet Take 1 tablet (10 mg total) by mouth at bedtime. 10/12/19  Steele Sizer, MD  omeprazole (PRILOSEC) 40 MG capsule TAKE ONE CAPSULE BY MOUTH EVERY MORNING 01/24/20   Ancil Boozer, Drue Stager, MD  rOPINIRole (REQUIP) 1 MG tablet TAKE ONE TABLET BY MOUTH EVERY NIGHT AT BEDTIME 01/24/20   Steele Sizer, MD  SUMAtriptan (IMITREX) 100 MG tablet Take 1 tablet (100 mg total) by mouth every 2 (two) hours as needed for migraine. May repeat in 2 hours if headache persists or recurs. 06/20/19   Steele Sizer, MD  tizanidine (ZANAFLEX) 2 MG capsule Take 1 capsule (2 mg total) by mouth daily as needed for muscle spasms. 10/12/19   Steele Sizer, MD  topiramate (TOPAMAX) 100 MG tablet Take 1 tablet (100 mg total) by mouth every evening. 10/03/19   Steele Sizer, MD  venlafaxine XR (EFFEXOR-XR) 150 MG 24 hr capsule Take 1 capsule (150 mg total) by mouth daily with breakfast. 01/29/20   Ursula Alert, MD  venlafaxine XR (EFFEXOR-XR) 37.5 MG 24 hr capsule Take 1 capsule (37.5 mg total) by mouth daily with breakfast. To be combined with 150 mg 03/20/20   Ursula Alert, MD    Physical Exam Vitals: Blood pressure 132/84, weight 231  lb (104.8 kg), last menstrual period 03/07/2020.  General: NAD HEENT: normocephalic, anicteric Pulmonary: No increased work of breathing Extremities: no edema, erythema, or tenderness Neurologic: Grossly intact, normal gait Psychiatric: mood appropriate, affect full  US PELVIC COMPLETE WITH TRANSVAGINAL  Result Date: 03/25/2020 Patient Name: Kelly [name] DOB: 10-06-1965 MRN: [id number] ULTRASOUND REPORT Location: [facility] Date of Service: 03/25/2020 Indications:Postmenopausal bleeding Findings: The uterus is anteverted and measures 9.9 x 5.6 x 3.9 cm. Echo texture is homogenous without evidence of focal masses. The Endometrium measures 8.1 mm. The endometrium is somewhat ill-defined. Right Ovary measures 2.2 x 1.8 x 1.3 cm. It is normal in appearance. Left Ovary measures 2.8 x 2.7 x 1.9  cm. It is normal in appearance. There is a follicle in the left ovary measuring 14.3 x 13.3 x 14.4 mm. Survey of the adnexa demonstrates no adnexal masses. There is no free fluid in the cul de sac. Impression: 1. The endometrium is somewhat ill-defined and thick for a postmenopausal woman. 2. Normal ovaries. Recommendations: 1.Clinical correlation with the patient's History and Physical Exam. Gweneth Dimitri, RT Images reviewed.  Normal GYN study without visualized pathology.  In the setting of postmenopausal bleeding the endometrium is considered thickened. Malachy Mood, MD, [facility], [facility] 03/25/2020, 2:39 PM     Assessment: 54 y.o. f/u TVUS for PMB and hematometra, cervical stenosis Plan: Problem List Items Addressed This Visit    None    Visit Diagnoses    PMB (postmenopausal bleeding)    -  Primary   Hematometra       Cervical stenosis (uterine cervix)          1) PMB bleeding - 5.4 x 4.9 x 5cm low density cervical lesion noted on CT 03/10/2020 with ultrasound favoring hematometra.  The cervix was noted to display stenosis on exam 03/15/2020 with the  patient tolerating dilation in the office.  The fluid collection appears to be resolved on imaging today.  The patient continues to have some light bleeding.  Pathology is pending from 03/15/20.  I discussed if no concerning findings on pathology and bleeding stops in the next 2-3 weeks no further work up.  If continued bleeding or non-diagnostic pathology would proceed with hysteroscopy D&C.  2) E. Coli UTI - on 12/31/16 and 03/23/2020.  Definition of recurrent urinary tract infections is 2 culture proven episodes in 6 months, or 3 culture proven episodes in 1 year with individual episodes separated by at least two weeks or documentation of differing urinary tract pathogen.  Relapse of urinary tract infection is symptoms within 2 weeks of adequate treatment, or recurrence of symptoms within 2 weeks and documentation of the same causative urinary tract pathogen.      Museum/gallery exhibitions officer Society Best-Practice Statement: Recurrent Urinary Tract Infection in Adult Women" Female Pelvic Medicine & Reconstructive Surgery Volume 24, Number 5, September/October 2018  - patient has ID consult pending for treatment given multiple antibiotic allergies.    2) A total of 15 minutes were spent in face-to-face contact with the patient during this encounter with over half of that time devoted to counseling and coordination of care.  3) Return in about 1 year (around 03/25/2021) for annual or as needed if bleeding persists.   Malachy Mood, MD, [facility], [facility] 03/25/2020, 3:17 PM

## 2020-03-26 LAB — SURGICAL PATHOLOGY

## 2020-04-11 NOTE — Progress Notes (Signed)
Name: Kelly [name]   MRN: [id number]    DOB: 01-04-1966   Date:04/12/2020       Progress Note  Subjective  Chief Complaint  Chief Complaint  Patient presents with  . Follow-up    6 month follow up    HPI  MDD: she is still seeing Dr. Shea Evans, last visit 10/10/2019  She is on Effexor hydroxizine, and Remeron added to help with sleep. She states she is now taking Effexor in am, she states she has a emotional support dog , she helps her with panic attacks but laying on her chest She was admitted for depression from 09/19/2019 until 10/03/2019     Migraine: Shestates since started Topamax she has not had any episodes of migraine, usually temporal and goes to opposite side, associated withphotophobia, phonophobia, dizziness, and sometimes vomiting and scotomas.She states only one migraine episode since her last visit , sometimes she has nuchal pain from her neck, she still has Imitrex at home   Metabolic Syndrome: she denies polyphagia, polydipsia or polyuria. A1C was5.6 %  Cervical neck spasm: she stopped Tizanidine because it was not helping with symptoms   GERD: under control with medicationat this time. She is taking Omeprazole daily, still has to take prn otc medication occasionally , no weight loss    Chronic pain: she has neck pain, back pain, OA and RLS she is trying to see Dr. Holley Raring, she has episodes of radiculitis down left leg. Taking medication as prescribed   UTI: going on for months, urine culture was positive for E. Coli 03/2020 but unable to take antibiotics because of allergies, she continues to have urine odor, episodes of urinary frequency, fatigue, cloudy urine and very concentrated, I had discussed it with ID physician but referral was not placed, we will try again today. She is drinking more water, avoiding caffeine, taking AZO prn and also cranberry juice and probiotics. No fever , she has chronic back pain.   History of post-menopausal bleeding: seen  by Dr. Star Age and no longer having vaginal bleeding   Patient Active Problem List   Diagnosis Date Noted  . Chronic pain syndrome 12/14/2019  . Lumbar spondylosis 05/01/2019  . Cervicalgia 05/01/2019  . Encounter for screening colonoscopy   . Polyp of sigmoid colon   . GAD (generalized anxiety disorder) 02/28/2019  . Panic disorder 02/28/2019  . Insomnia due to mental condition 02/28/2019  . Morbid obesity ([place]) 07/26/2017  . Spondylolisthesis of lumbosacral region 02/11/2016  . Hematuria 04/09/2015  . Hyperglycemia 04/06/2015  . Right lumbar radiculitis 12/28/2014  . MDD (major depressive disorder), recurrent episode, moderate ([place]) 12/27/2014  . Gastro-esophageal reflux disease without esophagitis 12/27/2014  . Bulge of lumbar disc without myelopathy 12/27/2014  . Dysmetabolic syndrome 28/36/6294  . Migraine without aura and responsive to treatment 12/27/2014  . Osteoarthrosis 12/27/2014  . Obesity (BMI 30-39.9) 12/27/2014  . Restless leg 12/27/2014  . Allergic rhinitis, seasonal 12/27/2014    Past Surgical History:  Procedure Laterality Date  . BACK SURGERY    . COLONOSCOPY WITH PROPOFOL N/A 02/03/2019   Procedure: COLONOSCOPY WITH PROPOFOL;  Surgeon: Jonathon Bellows, MD;  Location: [facility] ENDOSCOPY;  Service: Gastroenterology;  Laterality: N/A;  . COLONOSCOPY WITH PROPOFOL N/A 03/10/2019   Procedure: COLONOSCOPY WITH PROPOFOL;  Surgeon: Jonathon Bellows, MD;  Location: [facility] ENDOSCOPY;  Service: Gastroenterology;  Laterality: N/A;  . DIAGNOSTIC LAPAROSCOPY    . DILATION AND CURETTAGE OF UTERUS    . KNEE ARTHROSCOPY WITH MEDIAL MENISECTOMY Left 01/14/2017  Procedure: KNEE ARTHROSCOPY WITH MEDIAL AND LATERAL MENISECTOMY;  Surgeon: Hessie Knows, MD;  Location: [facility] ORS;  Service: Orthopedics;  Laterality: Left;  Partial Knee menisectomy  . KNEE SURGERY Left 05/2011   Dr. Rudene Christians- Arthroscopic  . LUMBAR LAMINECTOMY/DECOMPRESSION MICRODISCECTOMY N/A 09/16/2016   Procedure: LUMBAR  LAMINECTOMY/DECOMPRESSION MICRODISCECTOMY 1 LEVEL L5-S1;  Surgeon: Meade Maw, MD;  Location: [facility] ORS;  Service: Neurosurgery;  Laterality: N/A;  . TONSILLECTOMY AND ADENOIDECTOMY    . URETHRAL STRICTURE DILATATION      Family History  Problem Relation Age of Onset  . Cancer Paternal Grandmother   . Anxiety disorder Cousin   . Depression Cousin     Social History   Tobacco Use  . Smoking status: Former Smoker    Packs/day: 0.75    Years: 2.00    Pack years: 1.50    Types: Cigarettes    Start date: 07/06/1981    Quit date: 07/07/1983    Years since quitting: 36.7  . Smokeless tobacco: Never Used  Substance Use Topics  . Alcohol use: No    Alcohol/week: 0.0 standard drinks     Current Outpatient Medications:  .  busPIRone (BUSPAR) 7.5 MG tablet, Take 1 tablet (7.5 mg total) by mouth daily., Disp: 90 tablet, Rfl: 0 .  fluticasone (FLONASE) 50 MCG/ACT nasal spray, Place 2 sprays into both nostrils daily., Disp: 16 g, Rfl: 2 .  gabapentin (NEURONTIN) 300 MG capsule, 300 mg qAM, 600 mg QHS, Disp: 90 capsule, Rfl: 2 .  loratadine (CLARITIN) 10 MG tablet, Take 1 tablet (10 mg total) by mouth daily., Disp: 90 tablet, Rfl: 3 .  mirtazapine (REMERON) 7.5 MG tablet, Take 1 tablet (7.5 mg total) by mouth at bedtime. For sleep and anxiety, Disp: 90 tablet, Rfl: 0 .  omeprazole (PRILOSEC) 40 MG capsule, Take 1 capsule (40 mg total) by mouth every morning., Disp: 90 capsule, Rfl: 1 .  rOPINIRole (REQUIP) 1 MG tablet, Take 1 tablet (1 mg total) by mouth at bedtime., Disp: 90 tablet, Rfl: 1 .  SUMAtriptan (IMITREX) 100 MG tablet, Take 1 tablet (100 mg total) by mouth every 2 (two) hours as needed for migraine. May repeat in 2 hours if headache persists or recurs., Disp: 10 tablet, Rfl: 0 .  topiramate (TOPAMAX) 100 MG tablet, Take 1 tablet (100 mg total) by mouth every evening., Disp: 90 tablet, Rfl: 1 .  venlafaxine XR (EFFEXOR-XR) 150 MG 24 hr capsule, Take 1 capsule (150 mg total) by  mouth daily with breakfast., Disp: 90 capsule, Rfl: 1 .  venlafaxine XR (EFFEXOR-XR) 37.5 MG 24 hr capsule, Take 1 capsule (37.5 mg total) by mouth daily with breakfast. To be combined with 150 mg, Disp: 90 capsule, Rfl: 0  Allergies  Allergen Reactions  . Penicillins Anaphylaxis, Hives, Nausea And Vomiting and Swelling    Has patient had a PCN reaction causing immediate rash, facial/tongue/throat swelling, SOB or lightheadedness with hypotension: Yes Has patient had a PCN reaction causing severe rash involving mucus membranes or skin necrosis: Yes Has patient had a PCN reaction that required hospitalization No Has patient had a PCN reaction occurring within the last 10 years: Yes If all of the above answers are "NO", then may proceed with Cephalosporin use.   . Chlorhexidine Gluconate Itching and Rash  . Ciprofloxacin Hives  . Clindamycin/Lincomycin Hives  . Erythromycin Hives and Nausea And Vomiting  . Keflex [Cephalexin] Hives  . Lyrica [Pregabalin]     Dizziness, syncope  . Nitrofurantoin Monohyd Macro Hives and  Nausea And Vomiting  . Other Hives  . Sulfa Antibiotics Hives and Nausea And Vomiting    Rapid heart rate  . Tetracyclines & Related Hives  . Adhesive [Tape] Rash  . Latex Rash  . Vancomycin Itching and Rash    I personally reviewed active problem list, medication list, allergies, family history, social history, health maintenance with the patient/caregiver today.   ROS  Constitutional: Negative for fever or weight change.  Respiratory: Negative for cough and shortness of breath.   Cardiovascular: Negative for chest pain or palpitations.  Gastrointestinal: Negative for abdominal pain, no bowel changes.  Musculoskeletal: Negative for gait problem or joint swelling.  Skin: Negative for rash.  Neurological: Negative for dizziness or headache.  No other specific complaints in a complete review of systems (except as listed in HPI above).  Objective  Vitals:    04/12/20 1049  BP: 110/80  Pulse: (!) 102  Resp: 16  Temp: 97.9 F (36.6 C)  SpO2: 97%  Weight: 236 lb 11.2 oz (107.4 kg)  Height: '5\' 4"'  (1.626 m)    Body mass index is 40.63 kg/m.  Physical Exam  Constitutional: Patient appears well-developed and well-nourished. Obese  No distress.  HEENT: head atraumatic, normocephalic, pupils equal and reactive to light,  neck supple Cardiovascular: Normal rate, regular rhythm and normal heart sounds.  No murmur heard. No BLE edema. Pulmonary/Chest: Effort normal and breath sounds normal. No respiratory distress. Abdominal: Soft.  There is no tenderness. Muscular skeletal: pain during palpation of lumbar spine  Psychiatric: Patient has a normal mood and affect. behavior is normal. Judgment and thought content normal.  Recent Results (from the past 2160 hour(s))  CBC     Status: None   Collection Time: 03/09/20  4:49 PM  Result Value Ref Range   WBC 9.9 4.0 - 10.5 K/uL   RBC 4.54 3.87 - 5.11 MIL/uL   Hemoglobin 13.4 12.0 - 15.0 g/dL   HCT 41.5 36 - 46 %   MCV 91.4 80.0 - 100.0 fL   MCH 29.5 26.0 - 34.0 pg   MCHC 32.3 30.0 - 36.0 g/dL   RDW 12.5 11.5 - 15.5 %   Platelets 330 150 - 400 K/uL   nRBC 0.0 0.0 - 0.2 %    Comment: Performed at [facility], [address], [place], [place] [address]  Basic metabolic panel     Status: Abnormal   Collection Time: 03/09/20  4:49 PM  Result Value Ref Range   Sodium 138 135 - 145 mmol/L   Potassium 4.5 3.5 - 5.1 mmol/L   Chloride 108 98 - 111 mmol/L   CO2 24 22 - 32 mmol/L   Glucose, Bld 88 70 - 99 mg/dL    Comment: Glucose reference range applies only to samples taken after fasting for at least 8 hours.   BUN 13 6 - 20 mg/dL   Creatinine, Ser 0.72 0.44 - 1.00 mg/dL   Calcium 8.7 (L) 8.9 - 10.3 mg/dL   GFR calc non Af Amer >60 >60 mL/min   GFR calc Af Amer >60 >60 mL/min   Anion gap 6 5 - 15    Comment: Performed at [facility], [address] [place], [place]  [address]  Urinalysis, Complete w Microscopic Urine, Clean Catch     Status: Abnormal   Collection Time: 03/09/20  4:49 PM  Result Value Ref Range   Color, Urine YELLOW (A) YELLOW   APPearance HAZY (A) CLEAR   Specific Gravity,  Urine 1.013 1.005 - 1.030   pH 5.0 5.0 - 8.0   Glucose, UA NEGATIVE NEGATIVE mg/dL   Hgb urine dipstick SMALL (A) NEGATIVE   Bilirubin Urine NEGATIVE NEGATIVE   Ketones, ur NEGATIVE NEGATIVE mg/dL   Protein, ur NEGATIVE NEGATIVE mg/dL   Nitrite NEGATIVE NEGATIVE   Leukocytes,Ua TRACE (A) NEGATIVE   RBC / HPF 0-5 0 - 5 RBC/hpf   WBC, UA 11-20 0 - 5 WBC/hpf   Bacteria, UA RARE (A) NONE SEEN   Squamous Epithelial / LPF 0-5 0 - 5   Mucus PRESENT     Comment: Performed at [facility], [address] [place], [place] [address]  Pregnancy, urine     Status: None   Collection Time: 03/09/20  4:49 PM  Result Value Ref Range   Preg Test, Ur NEGATIVE NEGATIVE    Comment: Performed at [facility], [address] [place], [place] [address]  Cytology - PAP     Status: Abnormal   Collection Time: 03/15/20  1:21 PM  Result Value Ref Range   High risk HPV Negative    Adequacy      Satisfactory for evaluation; transformation zone component PRESENT.   Diagnosis (A)     - Atypical squamous cells of undetermined significance (ASC-US)   Comment      Endometrial cells are present.  Clinical correlation and correlation   Comment with the LMP is recommended.    Comment Normal Reference Range HPV - Negative   Surgical pathology     Status: None   Collection Time: 03/15/20  2:00 PM  Result Value Ref Range   SURGICAL PATHOLOGY      SURGICAL PATHOLOGY CASE: [id number] PATIENT: Delena Serve Surgical Pathology Report     Clinical History: PMB; imaging showing hematometra, cervical stenosis on exam (nt)     FINAL MICROSCOPIC DIAGNOSIS:  A. ENDOMETRIUM, BIOPSY: - Endometrial polyp with breakdown, degenerative changes and  associated reactive atypia.  See comment - Negative for hyperplasia or carcinoma    COMMENT:  Multiple step sections were examined.  Immunohistochemical stain for Ki-67 is noncontributory.  Dr. Saralyn Pilar reviewed the case and concurs with the diagnosis.    GROSS DESCRIPTION:  The specimen is received in formalin and consists of a 2.1 x 2.0 x 0.4 cm aggregate of red-brown clotted blood and mucus.  Specimen is entirely submitted in 1 cassette. Craig Staggers 03/26/2020)    Final Diagnosis performed by Jaquita Folds, MD.   Electronically signed 03/26/2020 Technical component performed at [facility]. [facility], [place] [address], [place], [place] [address].  Profe ssional component performed at [facility], [place] [address] [place], [place] [address].  Immunohistochemistry Technical component (if applicable) was performed at [facility]. [address] [place], [place],  [address].   IMMUNOHISTOCHEMISTRY DISCLAIMER (if applicable): Some of these immunohistochemical stains may have been developed and the performance characteristics determine by [facility]. Some may not have been cleared or approved by the U.S. Food and Drug Administration. The FDA has determined that such clearance or approval is not necessary. This test is used for clinical purposes. It should not be regarded as investigational or for research. This laboratory is certified under the [place] (CLIA-88) as qualified to perform high complexity clinical laboratory testing.  The controls stained appropriately.   Urine Culture     Status: Abnormal   Collection Time: 03/21/20 11:11 AM   Specimen: Urine  Result  Value Ref Range   MICRO NUMBER: [id number]    SPECIMEN QUALITY: Adequate    Sample Source URINE    STATUS: FINAL    ISOLATE 1: Escherichia coli (A)     Comment: Greater than 100,000 CFU/mL of Escherichia coli       Susceptibility   Escherichia coli - URINE CULTURE, REFLEX    AMOX/CLAVULANIC 4 Sensitive     AMPICILLIN 8 Sensitive     AMPICILLIN/SULBACTAM 4 Sensitive     CEFAZOLIN* <=4 Not Reportable      * For infections other than uncomplicated UTIcaused by E. coli, K. pneumoniae or P. mirabilis:Cefazolin is resistant if MIC > or = 8 mcg/mL.(Distinguishing susceptible versus intermediatefor isolates with MIC < or = 4 mcg/mL requiresadditional testing.)For uncomplicated UTI caused by E. coli,K. pneumoniae or P. mirabilis: Cefazolin issusceptible if MIC <32 mcg/mL and predictssusceptible to the oral agents cefaclor, cefdinir,cefpodoxime, cefprozil, cefuroxime, cephalexinand loracarbef.    CEFEPIME <=1 Sensitive     CEFTRIAXONE <=1 Sensitive     CIPROFLOXACIN <=0.25 Sensitive     LEVOFLOXACIN <=0.12 Sensitive     ERTAPENEM <=0.5 Sensitive     GENTAMICIN <=1 Sensitive     IMIPENEM <=0.25 Sensitive     NITROFURANTOIN <=16 Sensitive     PIP/TAZO <=4 Sensitive     TOBRAMYCIN <=1 Sensitive     TRIMETH/SULFA* <=20 Sensitive      * For infections other than uncomplicated UTIcaused by E. coli, K. pneumoniae or P. mirabilis:Cefazolin is resistant if MIC > or = 8 mcg/mL.(Distinguishing susceptible versus intermediatefor isolates with MIC < or = 4 mcg/mL requiresadditional testing.)For uncomplicated UTI caused by E. coli,K. pneumoniae or P. mirabilis: Cefazolin issusceptible if MIC <32 mcg/mL and predictssusceptible to the oral agents cefaclor, cefdinir,cefpodoxime, cefprozil, cefuroxime, cephalexinand loracarbef.Legend:S = Susceptible  I = IntermediateR = Resistant  NS = Not susceptible* = Not tested  NR = Not reported**NN = See antimicrobic comments  Hemoglobin A1c     Status: None   Collection Time: 03/21/20 11:11 AM  Result Value Ref Range   Hgb A1c MFr Bld 5.6 <5.7 % of total Hgb    Comment: For the purpose of screening for the presence of diabetes: . <5.7%       Consistent with the absence of  diabetes 5.7-6.4%    Consistent with increased risk for diabetes             (prediabetes) > or =6.5%  Consistent with diabetes . This assay result is consistent with a decreased risk of diabetes. . Currently, no consensus exists regarding use of hemoglobin A1c for diagnosis of diabetes in children. . According to American Diabetes Association (ADA) guidelines, hemoglobin A1c <7.0% represents optimal control in non-pregnant diabetic patients. Different metrics may apply to specific patient populations.  Standards of Medical Care in Diabetes(ADA). .    Mean Plasma Glucose 114 (calc)   eAG (mmol/L) 6.3 (calc)  COMPLETE METABOLIC PANEL WITH GFR     Status: Abnormal   Collection Time: 03/21/20 11:11 AM  Result Value Ref Range   Glucose, Bld 92 65 - 99 mg/dL    Comment: .            Fasting reference interval .    BUN 17 7 - 25 mg/dL   Creat 0.84 0.50 - 1.05 mg/dL    Comment: For patients >73 years of age, the reference limit for Creatinine is approximately 13% higher for people identified as African-American. .    GFR, Est Non African American  79 > OR = 60 mL/min/1.29m   GFR, Est African American 91 > OR = 60 mL/min/1.797m  BUN/Creatinine Ratio NOT APPLICABLE 6 - 22 (calc)   Sodium 139 135 - 146 mmol/L   Potassium 5.4 (H) 3.5 - 5.3 mmol/L   Chloride 108 98 - 110 mmol/L   CO2 24 20 - 32 mmol/L   Calcium 9.4 8.6 - 10.4 mg/dL   Total Protein 6.5 6.1 - 8.1 g/dL   Albumin 4.3 3.6 - 5.1 g/dL   Globulin 2.2 1.9 - 3.7 g/dL (calc)   AG Ratio 2.0 1.0 - 2.5 (calc)   Total Bilirubin 0.3 0.2 - 1.2 mg/dL   Alkaline phosphatase (APISO) 69 37 - 153 U/L   AST 12 10 - 35 U/L   ALT 10 6 - 29 U/L  CBC with Differential/Platelet     Status: None   Collection Time: 03/21/20 11:11 AM  Result Value Ref Range   WBC 8.9 3.8 - 10.8 Thousand/uL   RBC 4.52 3.80 - 5.10 Million/uL   Hemoglobin 13.2 11.7 - 15.5 g/dL   HCT 41.2 35 - 45 %   MCV 91.2 80.0 - 100.0 fL   MCH 29.2 27.0 - 33.0 pg    MCHC 32.0 32.0 - 36.0 g/dL   RDW 12.2 11.0 - 15.0 %   Platelets 330 140 - 400 Thousand/uL   MPV 10.5 7.5 - 12.5 fL   Neutro Abs 5,527 1,500 - 7,800 cells/uL   Lymphs Abs 2,412 850 - 3,900 cells/uL   Absolute Monocytes 561 200 - 950 cells/uL   Eosinophils Absolute 320 15 - 500 cells/uL   Basophils Absolute 80 0 - 200 cells/uL   Neutrophils Relative % 62.1 %   Total Lymphocyte 27.1 %   Monocytes Relative 6.3 %   Eosinophils Relative 3.6 %   Basophils Relative 0.9 %  Lipid panel     Status: None   Collection Time: 03/21/20 11:11 AM  Result Value Ref Range   Cholesterol 179 <200 mg/dL   HDL 59 > OR = 50 mg/dL   Triglycerides 146 <150 mg/dL   LDL Cholesterol (Calc) 95 mg/dL (calc)    Comment: Reference range: <100 . Desirable range <100 mg/dL for primary prevention;   <70 mg/dL for patients with CHD or diabetic patients  with > or = 2 CHD risk factors. . Marland KitchenDL-C is now calculated using the Martin-Hopkins  calculation, which is a validated novel method providing  better accuracy than the Friedewald equation in the  estimation of LDL-C.  MaCresenciano Genret al. JAAnnamaria [id number]  (http://education.[facility].com/faq/FAQ164)    Total CHOL/HDL Ratio 3.0 <5.0 (calc)   Non-HDL Cholesterol (Calc) 120 <130 mg/dL (calc)    Comment: For patients with diabetes plus 1 major ASCVD risk  factor, treating to a non-HDL-C goal of <100 mg/dL  (LDL-C of <70 mg/dL) is considered a therapeutic  option.      PHQ2/9: [facility] 04/12/2020 03/21/2020 03/08/2020 12/20/2019 10/12/2019  Decreased Interest '1 1 1 ' 0 3  Down, Depressed, Hopeless 0 1 0 0 3  PHQ - 2 Score '1 2 1 ' 0 6  Altered sleeping 1 - - - 3  Tired, decreased energy 1 - - - 2  Change in appetite 1 - - - 3  Feeling bad or failure about yourself  0 - - - 2  Trouble concentrating 0 - - - 2  Moving slowly or fidgety/restless 0 - - - 1  Suicidal thoughts 0 - - -  1  PHQ-9 Score 4 - - - 20  Difficult doing work/chores  Somewhat difficult - - - Somewhat difficult  Some recent data might be hidden    phq 9 is positive   Fall Risk: Fall Risk  04/12/2020 03/21/2020 03/08/2020 12/20/2019 12/14/2019  Falls in the past year? 1 0 0 0 0  Number falls in past yr: 0 0 0 - -  Comment - - - - -  Injury with Fall? 0 0 0 - -  Comment - - - - -  Risk for fall due to : - - - - -  Risk for fall due to: Comment - - - - -  Follow up - - - - -     Functional Status Survey: Is the patient deaf or have difficulty hearing?: Yes Does the patient have difficulty seeing, even when wearing glasses/contacts?: No Does the patient have difficulty concentrating, remembering, or making decisions?: Yes Does the patient have difficulty walking or climbing stairs?: Yes Does the patient have difficulty dressing or bathing?: Yes Does the patient have difficulty doing errands alone such as visiting a doctor's office or shopping?: Yes   Assessment & Plan  1. Mild episode of recurrent major depressive disorder ([place])  Keep follow up with Dr. Shea Evans seems to be doing well   2. Recurrent UTI  - Ambulatory referral to Infectious Disease - CULTURE, URINE COMPREHENSIVE  3. Allergy to antibiotic  - Ambulatory referral to Infectious Disease  4. Migraine without aura and responsive to treatment  - topiramate (TOPAMAX) 100 MG tablet; Take 1 tablet (100 mg total) by mouth every evening.  Dispense: 90 tablet; Refill: 1  5. Need for immunization against influenza  - Flu Vaccine QUAD 36+ mos IM  6. Restless leg  - rOPINIRole (REQUIP) 1 MG tablet; Take 1 tablet (1 mg total) by mouth at bedtime.  Dispense: 90 tablet; Refill: 1  7. Morbid obesity ([place])  Discussed with the patient the risk posed by an increased BMI. Discussed importance of portion control, calorie counting and at least 150 minutes of physical activity weekly. Avoid sweet beverages and drink more water. Eat at least 6 servings of fruit and vegetables daily   8. Need for  shingles vaccine  - Varicella-zoster vaccine IM (Shingrix)  9. Gastro-esophageal reflux disease without esophagitis  - omeprazole (PRILOSEC) 40 MG capsule; Take 1 capsule (40 mg total) by mouth every morning.  Dispense: 90 capsule; Refill: 1  10. Dysmetabolic syndrome   11. Chronic low back pain with bilateral sciatica, unspecified back pain laterality   12. Hyperkalemia  - Potassium

## 2020-04-12 ENCOUNTER — Encounter: Payer: Self-pay | Admitting: Family Medicine

## 2020-04-12 ENCOUNTER — Ambulatory Visit (INDEPENDENT_AMBULATORY_CARE_PROVIDER_SITE_OTHER): Payer: BC Managed Care – PPO | Admitting: Family Medicine

## 2020-04-12 ENCOUNTER — Other Ambulatory Visit: Payer: Self-pay

## 2020-04-12 VITALS — BP 110/80 | HR 102 | Temp 97.9°F | Resp 16 | Ht 64.0 in | Wt 236.7 lb

## 2020-04-12 DIAGNOSIS — G43009 Migraine without aura, not intractable, without status migrainosus: Secondary | ICD-10-CM | POA: Diagnosis not present

## 2020-04-12 DIAGNOSIS — E875 Hyperkalemia: Secondary | ICD-10-CM | POA: Diagnosis not present

## 2020-04-12 DIAGNOSIS — M5441 Lumbago with sciatica, right side: Secondary | ICD-10-CM

## 2020-04-12 DIAGNOSIS — F330 Major depressive disorder, recurrent, mild: Secondary | ICD-10-CM

## 2020-04-12 DIAGNOSIS — M5442 Lumbago with sciatica, left side: Secondary | ICD-10-CM

## 2020-04-12 DIAGNOSIS — Z881 Allergy status to other antibiotic agents status: Secondary | ICD-10-CM

## 2020-04-12 DIAGNOSIS — G8929 Other chronic pain: Secondary | ICD-10-CM

## 2020-04-12 DIAGNOSIS — N390 Urinary tract infection, site not specified: Secondary | ICD-10-CM | POA: Diagnosis not present

## 2020-04-12 DIAGNOSIS — K219 Gastro-esophageal reflux disease without esophagitis: Secondary | ICD-10-CM

## 2020-04-12 DIAGNOSIS — E8881 Metabolic syndrome: Secondary | ICD-10-CM

## 2020-04-12 DIAGNOSIS — G2581 Restless legs syndrome: Secondary | ICD-10-CM

## 2020-04-12 DIAGNOSIS — Z23 Encounter for immunization: Secondary | ICD-10-CM | POA: Diagnosis not present

## 2020-04-12 MED ORDER — TOPIRAMATE 100 MG PO TABS: 100.0000 mg | ORAL_TABLET | Freq: Every evening | ORAL | 1 refills | Status: DC

## 2020-04-12 MED ORDER — ROPINIROLE HCL 1 MG PO TABS: 1.0000 mg | ORAL_TABLET | Freq: Every day | ORAL | 1 refills | Status: DC

## 2020-04-12 MED ORDER — OMEPRAZOLE 40 MG PO CPDR: 40.0000 mg | DELAYED_RELEASE_CAPSULE | Freq: Every morning | ORAL | 1 refills | Status: DC

## 2020-04-15 LAB — CULTURE, URINE COMPREHENSIVE
MICRO NUMBER:: 11053209
SPECIMEN QUALITY:: ADEQUATE

## 2020-04-15 LAB — POTASSIUM: Potassium: 4.7 mmol/L (ref 3.5–5.3)

## 2020-04-16 NOTE — Progress Notes (Signed)
Patient on for 9am Thursday [place] office is aware.

## 2020-04-18 ENCOUNTER — Other Ambulatory Visit: Payer: Self-pay

## 2020-04-18 ENCOUNTER — Ambulatory Visit: Payer: BC Managed Care – PPO | Attending: Infectious Diseases | Admitting: Infectious Diseases

## 2020-04-18 ENCOUNTER — Encounter: Payer: Self-pay | Admitting: Infectious Diseases

## 2020-04-18 VITALS — BP 121/72 | HR 91 | Temp 98.4°F | Resp 16 | Ht 64.0 in | Wt 236.0 lb

## 2020-04-18 DIAGNOSIS — F32A Depression, unspecified: Secondary | ICD-10-CM | POA: Diagnosis not present

## 2020-04-18 DIAGNOSIS — F329 Major depressive disorder, single episode, unspecified: Secondary | ICD-10-CM | POA: Insufficient documentation

## 2020-04-18 DIAGNOSIS — Z882 Allergy status to sulfonamides status: Secondary | ICD-10-CM | POA: Insufficient documentation

## 2020-04-18 DIAGNOSIS — Z880 Allergy status to penicillin: Secondary | ICD-10-CM | POA: Insufficient documentation

## 2020-04-18 DIAGNOSIS — B9620 Unspecified Escherichia coli [E. coli] as the cause of diseases classified elsewhere: Secondary | ICD-10-CM | POA: Diagnosis not present

## 2020-04-18 DIAGNOSIS — Z888 Allergy status to other drugs, medicaments and biological substances status: Secondary | ICD-10-CM | POA: Diagnosis not present

## 2020-04-18 DIAGNOSIS — F419 Anxiety disorder, unspecified: Secondary | ICD-10-CM | POA: Diagnosis not present

## 2020-04-18 DIAGNOSIS — Z87440 Personal history of urinary (tract) infections: Secondary | ICD-10-CM | POA: Insufficient documentation

## 2020-04-18 DIAGNOSIS — N857 Hematometra: Secondary | ICD-10-CM

## 2020-04-18 DIAGNOSIS — N390 Urinary tract infection, site not specified: Secondary | ICD-10-CM | POA: Diagnosis not present

## 2020-04-18 DIAGNOSIS — Z87891 Personal history of nicotine dependence: Secondary | ICD-10-CM | POA: Insufficient documentation

## 2020-04-18 DIAGNOSIS — Z881 Allergy status to other antibiotic agents status: Secondary | ICD-10-CM | POA: Diagnosis not present

## 2020-04-18 DIAGNOSIS — R8271 Bacteriuria: Secondary | ICD-10-CM | POA: Insufficient documentation

## 2020-04-18 DIAGNOSIS — Z79899 Other long term (current) drug therapy: Secondary | ICD-10-CM | POA: Insufficient documentation

## 2020-04-18 NOTE — Patient Instructions (Addendum)
You have been referred to me for e.coli in the urine You are not currently symptomatic - no dysuria, no pain abdomen and you feel better after the cervical dilatation To prevent the symptoms of Genitourinary syndrome of menopause and to prevent Urinary infection please do the following   *Estrace /Premarin cream topically- peasized apply topically three times a week * Cetaphil to clean the genital area ( not soap)  * Cranberry supplement (-Knudsen cranberry concentrate- 1 ounce mixed with 8 ounces of water *wash with water after bowel movt *Probiotic for vaginal health( can try Pearls vaginal health) * Increase water consumption- 8 glasses a day *Ask your doctors not to check your urine on a routine basis  *Avoid antibiotics unless systemic infection/ or before cystoscopy * Kegel Exercise to strengthen pelvic floor  A penicillin allergy test can be done if needed Get an exercise program with water aerobic exercise

## 2020-04-18 NOTE — Progress Notes (Signed)
NAME: Kelly [name]  DOB: 05/29/1966  MRN: [id number]  Date/Time: 04/18/2020 9:20 AM  REQUESTING PROVIDER: Dr.Sowles Subjective:  REASON FOR CONSULT: recurrent UTI ? Kelly [name] is a 54 y.o.female  with a history of post menopausa bleeding,hematometra Is referred to me for a positive urine culture Pt says she has no dysuria, has some frequency, no difficulty in passing urine usually. PT had no periods between aug 2020 until aug 2021, and then on sept 3rd she started having spotting and bleeding , she started having heavy cramps. She started having heavy bleeding and went to [facility] ED on 03/09/20. CT scan revealed a 5 cm mass- TVUS  The cervix demonstrates a 4.3 x 3.9 x 4.4 cm complex lesion containing a fluid fluid level. This likely represents blood products within the cervical canal corresponding to the finding from the prior CT scan. No discrete vascular mass lesion  identified. Question the possibility of cervical stenosis or other obstruction at the level of the external os. Recommend follow-up with OB/GYN for directed pelvic exam. She was asked to follow up gyn  Pt on 03/15/20 saw Dr.Staebler and he did cervical dilatation to release the hematometra. Pap smear ASCUS. Endometrial biopsy was fine  Pt has had urine culture on 9/16 and it showed a pan sensitive e.coli but patient says she is not symptomatic like she gets a uti- there is no pain lower abdomen, or dysuria, or flank pain)    Past Medical History:  Diagnosis Date  . Allergy   . Anxiety   . Depression   . GERD (gastroesophageal reflux disease)   . History of kidney stones   . Insomnia   . Intermittent low back pain   . Migraines   . Osteoarthritis   . Recurrent UTI   . Restless leg syndrome   . Sciatica of right side   . Symptomatic menopausal or female climacteric states   . Vertigo   . Vitamin D deficiency     Past Surgical History:  Procedure Laterality Date  . BACK SURGERY    . COLONOSCOPY WITH PROPOFOL  N/A 02/03/2019   Procedure: COLONOSCOPY WITH PROPOFOL;  Surgeon: Jonathon Bellows, MD;  Location: [facility] ENDOSCOPY;  Service: Gastroenterology;  Laterality: N/A;  . COLONOSCOPY WITH PROPOFOL N/A 03/10/2019   Procedure: COLONOSCOPY WITH PROPOFOL;  Surgeon: Jonathon Bellows, MD;  Location: [facility] ENDOSCOPY;  Service: Gastroenterology;  Laterality: N/A;  . DIAGNOSTIC LAPAROSCOPY    . DILATION AND CURETTAGE OF UTERUS    . KNEE ARTHROSCOPY WITH MEDIAL MENISECTOMY Left 01/14/2017   Procedure: KNEE ARTHROSCOPY WITH MEDIAL AND LATERAL MENISECTOMY;  Surgeon: Hessie Knows, MD;  Location: [facility] ORS;  Service: Orthopedics;  Laterality: Left;  Partial Knee menisectomy  . KNEE SURGERY Left 05/2011   Dr. Rudene Christians- Arthroscopic  . LUMBAR LAMINECTOMY/DECOMPRESSION MICRODISCECTOMY N/A 09/16/2016   Procedure: LUMBAR LAMINECTOMY/DECOMPRESSION MICRODISCECTOMY 1 LEVEL L5-S1;  Surgeon: Meade Maw, MD;  Location: [facility] ORS;  Service: Neurosurgery;  Laterality: N/A;  . TONSILLECTOMY AND ADENOIDECTOMY    . URETHRAL STRICTURE DILATATION      Social History   Socioeconomic History  . Marital status: Married    Spouse name: Jenny Reichmann  . Number of children: 2  . Years of education: 44  . Highest education level: Associate degree: occupational, Hotel manager, or vocational program  Occupational History  . Occupation: not employed  Tobacco Use  . Smoking status: Former Smoker    Packs/day: 0.75    Years: 2.00    Pack years: 1.50  Types: Cigarettes    Start date: 07/06/1981    Quit date: 07/07/1983    Years since quitting: 36.8  . Smokeless tobacco: Never Used  Vaping Use  . Vaping Use: Never used  Substance and Sexual Activity  . Alcohol use: No    Alcohol/week: 0.0 standard drinks  . Drug use: No  . Sexual activity: Yes    Partners: Male  Other Topics Concern  . Not on file  Social History Narrative   Married and husband has a lot of medical problems, he is working again    Investment banker, operational of Radio broadcast assistant  Strain: Medium Risk  . Difficulty of Paying Living Expenses: Somewhat hard  Food Insecurity: No Food Insecurity  . Worried About Charity fundraiser in the Last Year: Never true  . Ran Out of Food in the Last Year: Never true  Transportation Needs: No Transportation Needs  . Lack of Transportation (Medical): No  . Lack of Transportation (Non-Medical): No  Physical Activity:   . Days of Exercise per Week: Not on file  . Minutes of Exercise per Session: Not on file  Stress: Stress Concern Present  . Feeling of Stress : To some extent  Social Connections: Socially Integrated  . Frequency of Communication with Friends and Family: More than three times a week  . Frequency of Social Gatherings with Friends and Family: More than three times a week  . Attends Religious Services: More than 4 times per year  . Active Member of Clubs or Organizations: Yes  . Attends Archivist Meetings: More than 4 times per year  . Marital Status: Married  Human resources officer Violence: Not At Risk  . Fear of Current or Ex-Partner: No  . Emotionally Abused: No  . Physically Abused: No  . Sexually Abused: No    Family History  Problem Relation Age of Onset  . Cancer Paternal Grandmother   . Anxiety disorder Cousin   . Depression Cousin    Allergies  Allergen Reactions  . Penicillins Anaphylaxis, Hives, Nausea And Vomiting and Swelling    Has patient had a PCN reaction causing immediate rash, facial/tongue/throat swelling, SOB or lightheadedness with hypotension: Yes Has patient had a PCN reaction causing severe rash involving mucus membranes or skin necrosis: Yes Has patient had a PCN reaction that required hospitalization No Has patient had a PCN reaction occurring within the last 10 years: Yes If all of the above answers are "NO", then may proceed with Cephalosporin use.   . Chlorhexidine Gluconate Itching and Rash  . Ciprofloxacin Hives  . Clindamycin/Lincomycin Hives  . Erythromycin Hives  and Nausea And Vomiting  . Keflex [Cephalexin] Hives  . Lyrica [Pregabalin]     Dizziness, syncope  . Nitrofurantoin Monohyd Macro Hives and Nausea And Vomiting  . Other Hives  . Sulfa Antibiotics Hives and Nausea And Vomiting    Rapid heart rate  . Tetracyclines & Related Hives  . Adhesive [Tape] Rash  . Latex Rash  . Vancomycin Itching and Rash   ID  Recent  Procedure Surgery Injections Trauma Sick contacts Travel Antibiotic use Food- raw/exotic Steroid/immune suppressants/splenectomy/Hardware Animal bites Tick exposure Water sports Fishing/hunting/animal bird exposure ? Current Outpatient Medications  Medication Sig Dispense Refill  . busPIRone (BUSPAR) 7.5 MG tablet Take 1 tablet (7.5 mg total) by mouth daily. 90 tablet 0  . fluticasone (FLONASE) 50 MCG/ACT nasal spray Place 2 sprays into both nostrils daily. 16 g 2  . gabapentin (NEURONTIN) 300  MG capsule 300 mg qAM, 600 mg QHS 90 capsule 2  . loratadine (CLARITIN) 10 MG tablet Take 1 tablet (10 mg total) by mouth daily. 90 tablet 3  . mirtazapine (REMERON) 7.5 MG tablet Take 1 tablet (7.5 mg total) by mouth at bedtime. For sleep and anxiety 90 tablet 0  . omeprazole (PRILOSEC) 40 MG capsule Take 1 capsule (40 mg total) by mouth every morning. 90 capsule 1  . rOPINIRole (REQUIP) 1 MG tablet Take 1 tablet (1 mg total) by mouth at bedtime. 90 tablet 1  . SUMAtriptan (IMITREX) 100 MG tablet Take 1 tablet (100 mg total) by mouth every 2 (two) hours as needed for migraine. May repeat in 2 hours if headache persists or recurs. 10 tablet 0  . topiramate (TOPAMAX) 100 MG tablet Take 1 tablet (100 mg total) by mouth every evening. 90 tablet 1  . venlafaxine XR (EFFEXOR-XR) 150 MG 24 hr capsule Take 1 capsule (150 mg total) by mouth daily with breakfast. 90 capsule 1  . venlafaxine XR (EFFEXOR-XR) 37.5 MG 24 hr capsule Take 1 capsule (37.5 mg total) by mouth daily with breakfast. To be combined with 150 mg 90 capsule 0   No  current facility-administered medications for this visit.     Abtx:  Anti-infectives (From admission, onward)   None      REVIEW OF SYSTEMS:  Const: negative fever, negative chills, negative weight loss Eyes: negative diplopia or visual changes, negative eye pain ENT: negative coryza, negative sore throat Resp: negative cough, hemoptysis, dyspnea Cards: negative for chest pain, palpitations, lower extremity edema GU: negative for frequency, dysuria and hematuria GI: Negative for abdominal pain, diarrhea, bleeding, constipation Skin: negative for rash and pruritus Heme: negative for easy bruising and gum/nose bleeding MS: negative for myalgias, arthralgias, back pain and muscle weakness Neurolo:negative for headaches, dizziness, vertigo, memory problems  Psych: negative for feelings of anxiety, depression  Endocrine: negative for thyroid, diabetes Allergy/Immunology- negative for any medication or food allergies ? Pertinent Positives include : Objective:  VITALS:  Wt 236 lb (107 kg)   BMI 40.51 kg/m  PHYSICAL EXAM:  General: Alert, cooperative, no distress, appears stated age.  Head: Normocephalic, without obvious abnormality, atraumatic. Eyes: Conjunctivae clear, anicteric sclerae. Pupils are equal ENT Nares normal. No drainage or sinus tenderness. Lips, mucosa, and tongue normal. No Thrush Neck: Supple, symmetrical, no adenopathy, thyroid: non tender no carotid bruit and no JVD. Back: No CVA tenderness. Lungs: Clear to auscultation bilaterally. No Wheezing or Rhonchi. No rales. Heart: Regular rate and rhythm, no murmur, rub or gallop. Abdomen: Soft, non-tender,not distended. Bowel sounds normal. No masses Extremities: atraumatic, no cyanosis. No edema. No clubbing Skin: No rashes or lesions. Or bruising Lymph: Cervical, supraclavicular normal. Neurologic: Grossly non-focal Pertinent Labs Lab Results CBC    Component Value Date/Time   WBC 8.9 03/21/2020 1111   RBC  4.52 03/21/2020 1111   HGB 13.2 03/21/2020 1111   HGB 13.2 04/05/2015 1241   HCT 41.2 03/21/2020 1111   HCT 40.3 04/05/2015 1241   PLT 330 03/21/2020 1111   PLT 352 04/05/2015 1241   MCV 91.2 03/21/2020 1111   MCV 89 04/05/2015 1241   MCH 29.2 03/21/2020 1111   MCHC 32.0 03/21/2020 1111   RDW 12.2 03/21/2020 1111   RDW 13.4 04/05/2015 1241   LYMPHSABS 2,412 03/21/2020 1111   LYMPHSABS 2.9 04/05/2015 1241   MONOABS 495 12/31/2016 0931   EOSABS 320 03/21/2020 1111   EOSABS 0.3 04/05/2015 1241  BASOSABS 80 03/21/2020 1111   BASOSABS 0.0 04/05/2015 1241    CMP Latest Ref Rng & Units 04/12/2020 03/21/2020 03/09/2020  Glucose 65 - 99 mg/dL - 92 88  BUN 7 - 25 mg/dL - 17 13  Creatinine 0.50 - 1.05 mg/dL - 0.84 0.72  Sodium 135 - 146 mmol/L - 139 138  Potassium 3.5 - 5.3 mmol/L 4.7 5.4(H) 4.5  Chloride 98 - 110 mmol/L - 108 108  CO2 20 - 32 mmol/L - 24 24  Calcium 8.6 - 10.4 mg/dL - 9.4 8.7(L)  Total Protein 6.1 - 8.1 g/dL - 6.5 -  Total Bilirubin 0.2 - 1.2 mg/dL - 0.3 -  Alkaline Phos 38 - 126 U/L - - -  AST 10 - 35 U/L - 12 -  ALT 6 - 29 U/L - 10 -      Microbiology: Recent Results (from the past 240 hour(s))  CULTURE, URINE COMPREHENSIVE     Status: Abnormal   Collection Time: 04/12/20 11:35 AM   Specimen: Urine  Result Value Ref Range Status   MICRO NUMBER: [id number]  Final   SPECIMEN QUALITY: Adequate  Final   Source OTHER (SPECIFY)  Final   STATUS: FINAL  Final   ISOLATE 1: Escherichia coli (A)  Final    Comment: Greater than 100,000 CFU/mL of Escherichia coli      Susceptibility   Escherichia coli - CULT, URN, SPECIAL NEGATIVE 1    AMOX/CLAVULANIC 4 Sensitive     AMPICILLIN 8 Sensitive     AMPICILLIN/SULBACTAM 4 Sensitive     CEFAZOLIN* <=4 Not Reportable      * For infections other than uncomplicated UTIcaused by E. coli, K. pneumoniae or P. mirabilis:Cefazolin is resistant if MIC > or = 8 mcg/mL.(Distinguishing susceptible versus intermediatefor isolates with  MIC < or = 4 mcg/mL requiresadditional testing.)For uncomplicated UTI caused by E. coli,K. pneumoniae or P. mirabilis: Cefazolin issusceptible if MIC <32 mcg/mL and predictssusceptible to the oral agents cefaclor, cefdinir,cefpodoxime, cefprozil, cefuroxime, cephalexinand loracarbef.    CEFEPIME <=1 Sensitive     CEFTRIAXONE <=1 Sensitive     CIPROFLOXACIN <=0.25 Sensitive     LEVOFLOXACIN <=0.12 Sensitive     ERTAPENEM <=0.5 Sensitive     GENTAMICIN <=1 Sensitive     IMIPENEM <=0.25 Sensitive     NITROFURANTOIN <=16 Sensitive     PIP/TAZO <=4 Sensitive     TOBRAMYCIN <=1 Sensitive     TRIMETH/SULFA* <=20 Sensitive      * For infections other than uncomplicated UTIcaused by E. coli, K. pneumoniae or P. mirabilis:Cefazolin is resistant if MIC > or = 8 mcg/mL.(Distinguishing susceptible versus intermediatefor isolates with MIC < or = 4 mcg/mL requiresadditional testing.)For uncomplicated UTI caused by E. coli,K. pneumoniae or P. mirabilis: Cefazolin issusceptible if MIC <32 mcg/mL and predictssusceptible to the oral agents cefaclor, cefdinir,cefpodoxime, cefprozil, cefuroxime, cephalexinand loracarbef.Legend:S = Susceptible  I = IntermediateR = Resistant  NS = Not susceptible* = Not tested  NR = Not reported**NN = See antimicrobic comments     ? Impression/Recommendation ? Asymptomatic bacteriuria currently- colonization/contaminaiton with E.coli She has no symptoms today or in the past 2-3 weeks  to suggest a UTI.She recently had post menopausal  hematometra due to cervical plugging and stenosis and had cervical dilatation on 03/15/20 and says she feels much better after that with no lower abdominal discomfort. Pt is menopausal and need to take preventive measures to avoid Genitourinary syndrome of menopause  *Estrace /Premarin cream topically- peasized apply topically three times  a week * Cetaphil to clean the genital area ( not soap)  * Cranberry supplement (-Knudsen cranberry concentrate- 1  ounce mixed with 8 ounces of water *wash with water after bowel movt *Probiotic for vaginal health( can try Pearls vaginal health) * Increase water consumption- 8 glasses a day *Ask your doctors not to check your urine on a routine basis  *Avoid antibiotics unless systemic infection/ or before cystoscopy * Kegel Exercise to strengthen pelvic floor   will not treat with antibioitics now  ?if patient has trouble voiding then a referral to urology for ruling out post void bladder scan to look for residual urine   Multiple antibiotic allergy Some of them are side-effects PCN was a child hood allergy Can do PCN allergy test if needed Follow up PRN ? ___________________________________________________ Discussed with patient in great detail Communicated with PCP and Gyn  Note:  This document was prepared using Dragon voice recognition software and may include unintentional dictation errors.

## 2020-04-23 ENCOUNTER — Other Ambulatory Visit: Payer: Self-pay | Admitting: Family Medicine

## 2020-04-23 DIAGNOSIS — J302 Other seasonal allergic rhinitis: Secondary | ICD-10-CM

## 2020-04-23 DIAGNOSIS — J3089 Other allergic rhinitis: Secondary | ICD-10-CM

## 2020-04-28 ENCOUNTER — Encounter: Payer: Self-pay | Admitting: Student in an Organized Health Care Education/Training Program

## 2020-04-30 ENCOUNTER — Other Ambulatory Visit: Payer: Self-pay

## 2020-04-30 ENCOUNTER — Encounter: Payer: Self-pay | Admitting: Student in an Organized Health Care Education/Training Program

## 2020-04-30 ENCOUNTER — Ambulatory Visit
Payer: BC Managed Care – PPO | Attending: Student in an Organized Health Care Education/Training Program | Admitting: Student in an Organized Health Care Education/Training Program

## 2020-04-30 VITALS — BP 131/82 | HR 94 | Temp 97.3°F | Resp 20 | Ht 64.0 in | Wt 239.0 lb

## 2020-04-30 DIAGNOSIS — G894 Chronic pain syndrome: Secondary | ICD-10-CM

## 2020-04-30 DIAGNOSIS — M4317 Spondylolisthesis, lumbosacral region: Secondary | ICD-10-CM

## 2020-04-30 DIAGNOSIS — M47816 Spondylosis without myelopathy or radiculopathy, lumbar region: Secondary | ICD-10-CM | POA: Diagnosis not present

## 2020-04-30 MED ORDER — GABAPENTIN 300 MG PO CAPS: ORAL_CAPSULE | ORAL | 5 refills | Status: DC

## 2020-04-30 NOTE — Patient Instructions (Signed)
____________________________________________________________________________________________  Preparing for Procedure with Sedation  Procedure appointments are limited to planned procedures: . No Prescription Refills. . No disability issues will be discussed. . No medication changes will be discussed.  Instructions: . Oral Intake: Do not eat or drink anything for at least 8 hours prior to your procedure. (Exception: Blood Pressure Medication. See below.) . Transportation: Unless otherwise stated by your physician, you may drive yourself after the procedure. . Blood Pressure Medicine: Do not forget to take your blood pressure medicine with a sip of water the morning of the procedure. If your Diastolic (lower reading)is above 100 mmHg, elective cases will be cancelled/rescheduled. . Blood thinners: These will need to be stopped for procedures. Notify our staff if you are taking any blood thinners. Depending on which one you take, there will be specific instructions on how and when to stop it. . Diabetics on insulin: Notify the staff so that you can be scheduled 1st case in the morning. If your diabetes requires high dose insulin, take only  of your normal insulin dose the morning of the procedure and notify the staff that you have done so. . Preventing infections: Shower with an antibacterial soap the morning of your procedure. . Build-up your immune system: Take 1000 mg of Vitamin C with every meal (3 times a day) the day prior to your procedure. . Antibiotics: Inform the staff if you have a condition or reason that requires you to take antibiotics before dental procedures. . Pregnancy: If you are pregnant, call and cancel the procedure. . Sickness: If you have a cold, fever, or any active infections, call and cancel the procedure. . Arrival: You must be in the facility at least 30 minutes prior to your scheduled procedure. . Children: Do not bring children with you. . Dress appropriately:  Bring dark clothing that you would not mind if they get stained. . Valuables: Do not bring any jewelry or valuables.  Reasons to call and reschedule or cancel your procedure: (Following these recommendations will minimize the risk of a serious complication.) . Surgeries: Avoid having procedures within 2 weeks of any surgery. (Avoid for 2 weeks before or after any surgery). . Flu Shots: Avoid having procedures within 2 weeks of a flu shots or . (Avoid for 2 weeks before or after immunizations). . Barium: Avoid having a procedure within 7-10 days after having had a radiological study involving the use of radiological contrast. (Myelograms, Barium swallow or enema study). . Heart attacks: Avoid any elective procedures or surgeries for the initial 6 months after a "Myocardial Infarction" (Heart Attack). . Blood thinners: It is imperative that you stop these medications before procedures. Let us know if you if you take any blood thinner.  . Infection: Avoid procedures during or within two weeks of an infection (including chest colds or gastrointestinal problems). Symptoms associated with infections include: Localized redness, fever, chills, night sweats or profuse sweating, burning sensation when voiding, cough, congestion, stuffiness, runny nose, sore throat, diarrhea, nausea, vomiting, cold or Flu symptoms, recent or current infections. It is specially important if the infection is over the area that we intend to treat. . Heart and lung problems: Symptoms that may suggest an active cardiopulmonary problem include: cough, chest pain, breathing difficulties or shortness of breath, dizziness, ankle swelling, uncontrolled high or unusually low blood pressure, and/or palpitations. If you are experiencing any of these symptoms, cancel your procedure and contact your primary care physician for an evaluation.  Remember:  Regular Business hours are:    Monday to Thursday 8:00 AM to 4:00 PM  Provider's  Schedule: Milinda Pointer, MD:  Procedure days: Tuesday and Thursday 7:30 AM to 4:00 PM  Gillis Santa, MD:  Procedure days: Monday and Wednesday 7:30 AM to 4:00 PM ____________________________________________________________________________________________  A prescription for Gabapentin has been sent to your pharmacy. You were given information no the [facility]

## 2020-04-30 NOTE — Progress Notes (Signed)
Safety precautions to be maintained throughout the outpatient stay will include: orient to surroundings, keep bed in low position, maintain call bell within reach at all times, provide assistance with transfer out of bed and ambulation.  

## 2020-04-30 NOTE — Progress Notes (Signed)
PROVIDER NOTE: Information contained herein reflects review and annotations entered in association with encounter. Interpretation of such information and data should be left to medically-trained personnel. Information provided to patient can be located elsewhere in the medical record under "Patient Instructions". Document created using STT-dictation technology, any transcriptional errors that may result from process are unintentional.    Patient: Kelly [name]  Service Category: E/M  Provider: Gillis Santa, MD  DOB: June 07, 1966  DOS: 04/30/2020  Specialty: Interventional Pain Management  MRN: [id number]  Setting: Ambulatory outpatient  PCP: Kelly Sizer, MD  Type: Established Patient    Referring Provider: Steele Sizer, MD  Location: Office  Delivery: Face-to-face     HPI  [name], a 54 y.o. year old female, is here today because of her Lumbar spondylosis [M47.816]. [name] primary complain today is Back Pain (lower) Last encounter: My last encounter with her was on 12/20/2019. Pertinent problems: [name] has MDD (major depressive disorder), recurrent episode, moderate ([place]); Obesity (BMI 30-39.9); Restless leg; Allergic rhinitis, seasonal; Spondylolisthesis of lumbosacral region; GAD (generalized anxiety disorder); Lumbar spondylosis; and Chronic pain syndrome on their pertinent problem list. Pain Assessment: Severity of Chronic pain is reported as a 10-Worst pain ever/10. Location: Back Lower/both hips. Onset: More than a month ago. Quality: Dull, Sharp, Nagging, Constant, Cramping. Timing: Constant. Modifying factor(s): nothing. Vitals:  height is '5\' 4"'  (1.626 m) and weight is 239 lb (108.4 kg). Her temporal temperature is 97.3 F (36.3 C) (abnormal). Her blood pressure is 131/82 and her pulse is 94. Her respiration is 20 and oxygen saturation is 100%.   Reason for encounter: medication management. and worsening low back pain.  [name] presents today with  low back pain with radiation into bilateral lower extremity, right greater than left patient is status post bilateral L2-L3, L3, L4, L4-L5 facet midrange nerve blocks done on 12/20/2019.  These did provide pain relief for her low back pain, left greater than right.  She is having more right right-sided pain.  We discussed repeating lumbar facet medial branch nerve blocks at this level and adding increase steroid to see if that is an impact on her pain relief.  We also discussed spinal cord stimulation as the patient does have a history of L5-S1 laminectomy, microdiscectomy and symptoms of failed back surgical syndrome, postlaminectomy pain syndrome.  I will evaluate her interlaminar windows at the time of her repeat medial branch facet block.  I have provided the patient with resources regarding spinal cord stimulation.  I will refill her gabapentin as below.   ROS  Constitutional: Denies any fever or chills Gastrointestinal: No reported hemesis, hematochezia, vomiting, or acute GI distress Musculoskeletal: Denies any acute onset joint swelling, redness, loss of ROM, or weakness Neurological: No reported episodes of acute onset apraxia, aphasia, dysarthria, agnosia, amnesia, paralysis, loss of coordination, or loss of consciousness  Medication Review  SUMAtriptan, busPIRone, fluticasone, gabapentin, loratadine, mirtazapine, omeprazole, rOPINIRole, topiramate, and venlafaxine XR  History Review  Allergy: [name] is allergic to penicillins, chlorhexidine gluconate, ciprofloxacin, clindamycin/lincomycin, erythromycin, keflex [cephalexin], lyrica [pregabalin], nitrofurantoin monohyd macro, other, sulfa antibiotics, tetracyclines & related, adhesive [tape], latex, and vancomycin. Drug: [name]  reports no history of drug use. Alcohol:  reports no history of alcohol use. Tobacco:  reports that she quit smoking about 36 years ago. Her smoking use included cigarettes. She started smoking about 38  years ago. She has a 1.50 pack-year smoking history. She has never used smokeless tobacco. Social: [name]  reports that she quit smoking about 36 years ago. Her smoking use included cigarettes. She started smoking about 38 years ago. She has a 1.50 pack-year smoking history. She has never used smokeless tobacco. She reports that she does not drink alcohol and does not use drugs. Medical:  has a past medical history of Allergy, Anxiety, Depression, GERD (gastroesophageal reflux disease), History of kidney stones, Insomnia, Intermittent low back pain, Migraines, Osteoarthritis, Recurrent UTI, Restless leg syndrome, Sciatica of right side, Symptomatic menopausal or female climacteric states, Vertigo, and Vitamin D deficiency. Surgical: [name]  has a past surgical history that includes Tonsillectomy and adenoidectomy; Urethra dilation; Knee surgery (Left, 05/2011); Lumbar laminectomy/decompression microdiscectomy (N/A, 09/16/2016); Back surgery; Diagnostic laparoscopy; Dilation and curettage of uterus; Knee arthroscopy with medial menisectomy (Left, 01/14/2017); Colonoscopy with propofol (N/A, 02/03/2019); and Colonoscopy with propofol (N/A, 03/10/2019). Family: family history includes Anxiety disorder in her cousin; Cancer in her paternal grandmother; Depression in her cousin.  Laboratory Chemistry Profile   Renal Lab Results  Component Value Date   BUN 17 03/21/2020   CREATININE 0.84 75/64/3329   BCR NOT APPLICABLE 51/88/4166   GFRAA 91 03/21/2020   GFRNONAA 79 03/21/2020     Hepatic Lab Results  Component Value Date   AST 12 03/21/2020   ALT 10 03/21/2020   ALBUMIN 4.4 01/04/2017   ALKPHOS 69 01/04/2017   LIPASE 26 01/04/2017     Electrolytes Lab Results  Component Value Date   NA 139 03/21/2020   K 4.7 04/12/2020   CL 108 03/21/2020   CALCIUM 9.4 03/21/2020     Bone Lab Results  Component Value Date   VD25OH 37 03/31/2019     Inflammation (CRP: Acute Phase) (ESR:  Chronic Phase) No results found for: CRP, ESRSEDRATE, LATICACIDVEN     Note: Above Lab results reviewed.  Recent Imaging Review  US PELVIC COMPLETE WITH TRANSVAGINAL Patient Name: Kelly [name] DOB: 04/20/1966 MRN: [id number]   ULTRASOUND REPORT  Location: [facility]  Date of Service: 03/25/2020   Indications:Postmenopausal bleeding   Findings:  The uterus is anteverted and measures 9.9 x 5.6 x 3.9 cm. Echo texture is homogenous without evidence of focal masses. The Endometrium measures 8.1 mm. The endometrium is somewhat ill-defined.   Right Ovary measures 2.2 x 1.8 x 1.3 cm. It is normal in appearance. Left Ovary measures 2.8 x 2.7 x 1.9  cm. It is normal in appearance. There  is a follicle in the left ovary measuring 14.3 x 13.3 x 14.4 mm.  Survey of the adnexa demonstrates no adnexal masses.  There is no free fluid in the cul de sac.  Impression: 1. The endometrium is somewhat ill-defined and thick for a postmenopausal  woman.  2. Normal ovaries.   Recommendations: 1.Clinical correlation with the patient's History and Physical Exam.  Gweneth Dimitri, RT   Images reviewed.  Normal GYN study without visualized pathology.  In the  setting of postmenopausal bleeding the endometrium is considered  thickened.  Malachy Mood, MD, [facility], [facility] 03/25/2020, 2:39 PM Note: Reviewed        Physical Exam  General appearance: Well nourished, well developed, and well hydrated. In no apparent acute distress Mental status: Alert, oriented x 3 (person, place, & time)       Respiratory: No evidence of acute respiratory distress Eyes: PERLA Vitals: BP 131/82   Pulse 94   Temp (!) 97.3 F (36.3 C) (Temporal)   Resp 20   Ht  '5\' 4"'  (1.626 m)   Wt 239 lb (108.4 kg)   SpO2 100%   BMI 41.02 kg/m  BMI: Estimated body mass index is 41.02 kg/m as calculated from the following:   Height as of this encounter: '5\' 4"'  (1.626 m).    Weight as of this encounter: 239 lb (108.4 kg). Ideal: Ideal body weight: 54.7 kg (120 lb 9.5 oz) Adjusted ideal body weight: 76.2 kg (167 lb 15.3 oz)   Lumbar Spine Area Exam  Skin & Axial Inspection: Well healed scar from previous spine surgery detected Alignment: Symmetrical Functional ROM: Pain restricted ROM       Stability: No instability detected Muscle Tone/Strength: Functionally intact. No obvious neuro-muscular anomalies detected. Sensory (Neurological): Dermatomal pain pattern Palpation: Complains of area being tender to palpation       Provocative Tests: Hyperextension/rotation test: (+) bilaterally for facet joint pain. Lumbar quadrant test (Kemp's test): (+) bilaterally for facet joint pain. Lateral bending test: (+) ipsilateral radicular pain, bilaterally. Positive for bilateral foraminal stenosis. Patrick's Maneuver: deferred today                   FABER* test: deferred today                   S-I anterior distraction/compression test: deferred today         S-I lateral compression test: deferred today         S-I Thigh-thrust test: deferred today         S-I Gaenslen's test: deferred today         *(Flexion, ABduction and External Rotation) Gait & Posture Assessment  Ambulation: Unassisted Gait: Relatively normal for age and body habitus Posture: WNL  Lower Extremity Exam    Side: Right lower extremity  Side: Left lower extremity  Stability: No instability observed          Stability: No instability observed          Skin & Extremity Inspection: Skin color, temperature, and hair growth are WNL. No peripheral edema or cyanosis. No masses, redness, swelling, asymmetry, or associated skin lesions. No contractures.  Skin & Extremity Inspection: Skin color, temperature, and hair growth are WNL. No peripheral edema or cyanosis. No masses, redness, swelling, asymmetry, or associated skin lesions. No contractures.  Functional ROM: Pain restricted ROM for hip and knee  joints          Functional ROM: Pain restricted ROM for hip and knee joints          Muscle Tone/Strength: Functionally intact. No obvious neuro-muscular anomalies detected.  Muscle Tone/Strength: Functionally intact. No obvious neuro-muscular anomalies detected.  Sensory (Neurological): Neurogenic pain pattern        Sensory (Neurological): Neurogenic pain pattern        DTR: Patellar: deferred today Achilles: deferred today Plantar: deferred today  DTR: Patellar: deferred today Achilles: deferred today Plantar: deferred today  Palpation: No palpable anomalies  Palpation: No palpable anomalies     Assessment   Status Diagnosis  Persistent Persistent Persistent 1. Lumbar spondylosis   2. Spondylolisthesis of lumbosacral region   3. Chronic pain syndrome      Plan of Care   [name] has a current medication list which includes the following long-term medication(s): fluticasone, gabapentin, loratadine, mirtazapine, omeprazole, ropinirole, sumatriptan, topiramate, venlafaxine xr, and venlafaxine xr.  Pharmacotherapy (Medications Ordered): Meds ordered this encounter  Medications  . gabapentin (NEURONTIN) 300 MG capsule    Sig: 300 mg  qAM, 600 mg QHS    Dispense:  90 capsule    Refill:  5   Orders:  Orders Placed This Encounter  Procedures  . LUMBAR FACET(MEDIAL BRANCH NERVE BLOCK) MBNB    Standing Status:   Future    Standing Expiration Date:   05/31/2020    Scheduling Instructions:     Procedure: Lumbar facet block (AKA.: Lumbosacral medial branch nerve block)     Side: Bilateral     Level:Level: L2/3, L3/4, L4/5     Sedation: with     Timeframe: ASAA    Order Specific Question:   Where will this procedure be performed?    Answer:   [facility]   Future considerations: Spinal cord stimulator trial.  If interlaminar windows appear appropriate, patient will need thoracic MRI and psych evaluation prior to scheduling.  Follow-up plan:    Return in about 1 week (around 05/07/2020) for B/L L2/3, L3/4, L4/5 , with sedation (look at SCS windows).   Recent Visits No visits were found meeting these conditions. Showing recent visits within past 90 days and meeting all other requirements Today's Visits Date Type Provider Dept  04/30/20 Office Visit Kelly Santa, MD [facility]  Showing today's visits and meeting all other requirements Future Appointments Date Type Provider Dept  05/15/20 Appointment Kelly Santa, MD [facility]  Showing future appointments within next 90 days and meeting all other requirements  I discussed the assessment and treatment plan with the patient. The patient was provided an opportunity to ask questions and all were answered. The patient agreed with the plan and demonstrated an understanding of the instructions.  Patient advised to call back or seek an in-person evaluation if the symptoms or condition worsens.  Duration of encounter: 30 minutes.  Note by: Kelly Santa, MD Date: 04/30/2020; Time: 11:09 AM

## 2020-05-02 ENCOUNTER — Encounter: Payer: Self-pay | Admitting: Psychiatry

## 2020-05-02 ENCOUNTER — Telehealth (INDEPENDENT_AMBULATORY_CARE_PROVIDER_SITE_OTHER): Payer: BC Managed Care – PPO | Admitting: Psychiatry

## 2020-05-02 ENCOUNTER — Other Ambulatory Visit: Payer: Self-pay

## 2020-05-02 DIAGNOSIS — F331 Major depressive disorder, recurrent, moderate: Secondary | ICD-10-CM | POA: Diagnosis not present

## 2020-05-02 DIAGNOSIS — F5105 Insomnia due to other mental disorder: Secondary | ICD-10-CM | POA: Diagnosis not present

## 2020-05-02 DIAGNOSIS — F410 Panic disorder [episodic paroxysmal anxiety] without agoraphobia: Secondary | ICD-10-CM

## 2020-05-02 DIAGNOSIS — F411 Generalized anxiety disorder: Secondary | ICD-10-CM

## 2020-05-02 NOTE — Progress Notes (Signed)
Virtual Visit via Video Note  I connected with Kelly [name] on 05/02/20 at  4:00 PM EDT by a video enabled telemedicine application and verified that I am speaking with the correct person using two identifiers.  Location Provider Location : [facility] Patient Location : Home  Participants: Patient , Provider    I discussed the limitations of evaluation and management by telemedicine and the availability of in person appointments. The patient expressed understanding and agreed to proceed.     I discussed the assessment and treatment plan with the patient. The patient was provided an opportunity to ask questions and all were answered. The patient agreed with the plan and demonstrated an understanding of the instructions.   The patient was advised to call back or seek an in-person evaluation if the symptoms worsen or if the condition fails to improve as anticipated.   [place] MD OP Progress Note  05/02/2020 4:32 PM Kelly [name]  MRN:  [id number]  Chief Complaint:  Chief Complaint    Follow-up     HPI: Kelly [name] is a 54 year old Caucasian female who is married, lives in [place], has a history of MDD, GAD, panic disorder, insomnia, gastroesophageal reflux disease, lumbar disc problem, migraine headache was evaluated by telemedicine today.  Patient today reports she is currently struggling with a lot of pain.  She reports she was told she may need a spinal cord stimulator.  She is hence looking into that.  Patient reports because of her pain she continues to struggle with sleep some nights.  Depending on her sleep at night she might sleep during the day.  This does continue to make her kind of sluggish during the day.  She reports however her mood symptoms as improved on the higher dosage of venlafaxine.  She denies side effects.  Patient denies any suicidality, homicidality or perceptual disturbances.  She does report anger issues towards her daughter-in-law who is  keeping her grandchildren away from her.  Patient has upcoming court hearing coming up in November.  She however denies any thoughts about hurting her daughter-in-law and reports that she would rather walk away from that.  Patient denies any other concerns today.  Visit Diagnosis:    ICD-10-CM   1. MDD (major depressive disorder), recurrent episode, moderate (HCC)  F33.1   2. GAD (generalized anxiety disorder)  F41.1   3. Panic disorder  F41.0   4. Insomnia due to mental condition  F51.05     Past Psychiatric History: I have reviewed past psychiatric history from my progress note from 10/25/2018.  Past trials of medications.  Paxil, trazodone, hydroxyzine.  Patient completed IOP on 10/03/2019.  Past Medical History:  Past Medical History:  Diagnosis Date  . Allergy   . Anxiety   . Depression   . GERD (gastroesophageal reflux disease)   . History of kidney stones   . Insomnia   . Intermittent low back pain   . Migraines   . Osteoarthritis   . Recurrent UTI   . Restless leg syndrome   . Sciatica of right side   . Symptomatic menopausal or female climacteric states   . Vertigo   . Vitamin D deficiency     Past Surgical History:  Procedure Laterality Date  . BACK SURGERY    . COLONOSCOPY WITH PROPOFOL N/A 02/03/2019   Procedure: COLONOSCOPY WITH PROPOFOL;  Surgeon: Jonathon Bellows, MD;  Location: [facility] ENDOSCOPY;  Service: Gastroenterology;  Laterality: N/A;  . COLONOSCOPY WITH PROPOFOL N/A 03/10/2019  Procedure: COLONOSCOPY WITH PROPOFOL;  Surgeon: Jonathon Bellows, MD;  Location: [facility] ENDOSCOPY;  Service: Gastroenterology;  Laterality: N/A;  . DIAGNOSTIC LAPAROSCOPY    . DILATION AND CURETTAGE OF UTERUS    . KNEE ARTHROSCOPY WITH MEDIAL MENISECTOMY Left 01/14/2017   Procedure: KNEE ARTHROSCOPY WITH MEDIAL AND LATERAL MENISECTOMY;  Surgeon: Hessie Knows, MD;  Location: [facility] ORS;  Service: Orthopedics;  Laterality: Left;  Partial Knee menisectomy  . KNEE SURGERY Left 05/2011   Dr. Rudene Christians-  Arthroscopic  . LUMBAR LAMINECTOMY/DECOMPRESSION MICRODISCECTOMY N/A 09/16/2016   Procedure: LUMBAR LAMINECTOMY/DECOMPRESSION MICRODISCECTOMY 1 LEVEL L5-S1;  Surgeon: Meade Maw, MD;  Location: [facility] ORS;  Service: Neurosurgery;  Laterality: N/A;  . TONSILLECTOMY AND ADENOIDECTOMY    . URETHRAL STRICTURE DILATATION      Family Psychiatric History: I have reviewed family psychiatric history from my progress note on 10/25/2018  Family History:  Family History  Problem Relation Age of Onset  . Cancer Paternal Grandmother   . Anxiety disorder Cousin   . Depression Cousin     Social History: I have reviewed social history from my progress note on 10/25/2018 Social History   Socioeconomic History  . Marital status: Married    Spouse name: Jenny Reichmann  . Number of children: 2  . Years of education: 51  . Highest education level: Associate degree: occupational, Hotel manager, or vocational program  Occupational History  . Occupation: not employed  Tobacco Use  . Smoking status: Former Smoker    Packs/day: 0.75    Years: 2.00    Pack years: 1.50    Types: Cigarettes    Start date: 07/06/1981    Quit date: 07/07/1983    Years since quitting: 36.8  . Smokeless tobacco: Never Used  Vaping Use  . Vaping Use: Never used  Substance and Sexual Activity  . Alcohol use: No    Alcohol/week: 0.0 standard drinks  . Drug use: No  . Sexual activity: Yes    Partners: Male  Other Topics Concern  . Not on file  Social History Narrative   Married and husband has a lot of medical problems, he is working again    Investment banker, operational of Radio broadcast assistant Strain: Medium Risk  . Difficulty of Paying Living Expenses: Somewhat hard  Food Insecurity: No Food Insecurity  . Worried About Charity fundraiser in the Last Year: Never true  . Ran Out of Food in the Last Year: Never true  Transportation Needs: No Transportation Needs  . Lack of Transportation (Medical): No  . Lack of Transportation  (Non-Medical): No  Physical Activity:   . Days of Exercise per Week: Not on file  . Minutes of Exercise per Session: Not on file  Stress: Stress Concern Present  . Feeling of Stress : To some extent  Social Connections: Socially Integrated  . Frequency of Communication with Friends and Family: More than three times a week  . Frequency of Social Gatherings with Friends and Family: More than three times a week  . Attends Religious Services: More than 4 times per year  . Active Member of Clubs or Organizations: Yes  . Attends Archivist Meetings: More than 4 times per year  . Marital Status: Married    Allergies:  Allergies  Allergen Reactions  . Penicillins Anaphylaxis, Hives, Nausea And Vomiting and Swelling    Has patient had a PCN reaction causing immediate rash, facial/tongue/throat swelling, SOB or lightheadedness with hypotension: Yes Has patient had a PCN reaction causing severe  rash involving mucus membranes or skin necrosis: Yes Has patient had a PCN reaction that required hospitalization No Has patient had a PCN reaction occurring within the last 10 years: Yes If all of the above answers are "NO", then may proceed with Cephalosporin use.   . Chlorhexidine Gluconate Itching and Rash  . Ciprofloxacin Hives  . Clindamycin/Lincomycin Hives  . Erythromycin Hives and Nausea And Vomiting  . Keflex [Cephalexin] Hives  . Lyrica [Pregabalin]     Dizziness, syncope  . Nitrofurantoin Monohyd Macro Hives and Nausea And Vomiting  . Other Hives  . Sulfa Antibiotics Hives and Nausea And Vomiting    Rapid heart rate  . Tetracyclines & Related Hives  . Adhesive [Tape] Rash  . Latex Rash  . Vancomycin Itching and Rash    Metabolic Disorder Labs: Lab Results  Component Value Date   HGBA1C 5.6 03/21/2020   MPG 114 03/21/2020   MPG 105 03/31/2019   No results found for: PROLACTIN Lab Results  Component Value Date   CHOL 179 03/21/2020   TRIG 146 03/21/2020   HDL 59  03/21/2020   CHOLHDL 3.0 03/21/2020   VLDL 22 12/31/2016   LDLCALC 95 03/21/2020   LDLCALC 82 06/17/2018   Lab Results  Component Value Date   TSH 1.75 03/31/2019   TSH 2.56 12/31/2016    Therapeutic Level Labs: No results found for: LITHIUM No results found for: VALPROATE No components found for:  CBMZ  Current Medications: Current Outpatient Medications  Medication Sig Dispense Refill  . busPIRone (BUSPAR) 7.5 MG tablet Take 1 tablet (7.5 mg total) by mouth daily. 90 tablet 0  . fluticasone (FLONASE) 50 MCG/ACT nasal spray Place 2 sprays into both nostrils daily. 16 g 2  . gabapentin (NEURONTIN) 300 MG capsule 300 mg qAM, 600 mg QHS 90 capsule 5  . loratadine (CLARITIN) 10 MG tablet Take 1 tablet (10 mg total) by mouth daily. 90 tablet 3  . mirtazapine (REMERON) 7.5 MG tablet Take 1 tablet (7.5 mg total) by mouth at bedtime. For sleep and anxiety 90 tablet 0  . omeprazole (PRILOSEC) 40 MG capsule Take 1 capsule (40 mg total) by mouth every morning. 90 capsule 1  . rOPINIRole (REQUIP) 1 MG tablet Take 1 tablet (1 mg total) by mouth at bedtime. 90 tablet 1  . SUMAtriptan (IMITREX) 100 MG tablet Take 1 tablet (100 mg total) by mouth every 2 (two) hours as needed for migraine. May repeat in 2 hours if headache persists or recurs. 10 tablet 0  . topiramate (TOPAMAX) 100 MG tablet Take 1 tablet (100 mg total) by mouth every evening. 90 tablet 1  . venlafaxine XR (EFFEXOR-XR) 150 MG 24 hr capsule Take 1 capsule (150 mg total) by mouth daily with breakfast. 90 capsule 1  . venlafaxine XR (EFFEXOR-XR) 37.5 MG 24 hr capsule Take 1 capsule (37.5 mg total) by mouth daily with breakfast. To be combined with 150 mg 90 capsule 0   No current facility-administered medications for this visit.     Musculoskeletal: Strength & Muscle Tone: UTA Gait & Station: Seated Patient leans: N/A  Psychiatric Specialty Exam: Review of Systems  Musculoskeletal: Positive for back pain.   Psychiatric/Behavioral: Positive for dysphoric mood and sleep disturbance.  All other systems reviewed and are negative.   There were no vitals taken for this visit.There is no height or weight on file to calculate BMI.  General Appearance: Casual  Eye Contact:  Fair  Speech:  Clear and Coherent  Volume:  Normal  Mood:  Depressed improving  Affect:  Congruent  Thought Process:  Goal Directed and Descriptions of Associations: Intact  Orientation:  Full (Time, Place, and Person)  Thought Content: Logical   Suicidal Thoughts:  No  Homicidal Thoughts:  No  Memory:  Immediate;   Fair Recent;   Fair Remote;   Fair  Judgement:  Fair  Insight:  Fair  Psychomotor Activity:  Normal  Concentration:  Concentration: Fair and Attention Span: Fair  Recall:  [employer] of Knowledge: Fair  Language: Fair  Akathisia:  No  Handed:  Right  AIMS (if indicated): UTA  Assets:  Communication Skills Desire for Improvement Housing Social Support  ADL's:  Intact  Cognition: WNL  Sleep:  Poor due to pain   Screenings: GAD-7     Office Visit from 04/12/2020 in [facility] Office Visit from 10/12/2019 in [facility] Office Visit from 06/20/2019 in [facility] Office Visit from 03/23/2019 in [facility] Office Visit from 02/22/2019 in [facility]  Total GAD-7 Score 4 15 2 1 1     PHQ2-9     Office Visit from 04/30/2020 in [facility] Office Visit from 04/12/2020 in [facility] Office Visit from 03/21/2020 in [facility] Video Visit from 03/08/2020 in [facility] Procedure visit from 12/20/2019 in [facility]  PHQ-2 Total Score 0 1 2 1  0  PHQ-9 Total Score -- 4 -- -- --       Assessment and Plan: ZIANNA DERCOLE is a 54 year old female who has a history of panic  attacks, insomnia, MDD, arthritis, GAD was evaluated by telemedicine today.  Patient with psychosocial stressors of physical limitations due to pain, financial problems, relationship struggles and legal problems.  Patient however is currently making progress.  Plan as noted below.  Plan MDD-improving Venlafaxine 187.5 mg p.o. daily Mirtazapine 7.5 mg p.o. nightly  GAD-improving Venlafaxine as prescribed Mirtazapine 7.5 mg p.o. nightly BuSpar 7.5 mg p.o. daily Continue CBT  Panic attacks-improving BuSpar 7.5 mg p.o. daily.  Dose reduced due to side effects. Hydroxyzine 25 mg p.o. twice daily as needed Continue CBT  Insomnia-improving Mirtazapine 7.5 mg p.o. nightly for sleep  Follow-up in clinic in 1 month or sooner if needed.  I have spent atleast 20 minutes face to face by video with patient today. More than 50 % of the time was spent for preparing to see the patient ( e.g., review of test, records ),  ordering medications and test ,psychoeducation and supportive psychotherapy and care coordination,as well as documenting clinical information in electronic health record. This note was generated in part or whole with voice recognition software. Voice recognition is usually quite accurate but there are transcription errors that can and very often do occur. I apologize for any typographical errors that were not detected and corrected.       Kelly Alert, MD 05/03/2020, 8:07 AM

## 2020-05-15 ENCOUNTER — Other Ambulatory Visit: Payer: Self-pay

## 2020-05-15 ENCOUNTER — Ambulatory Visit (HOSPITAL_BASED_OUTPATIENT_CLINIC_OR_DEPARTMENT_OTHER): Payer: BC Managed Care – PPO | Admitting: Student in an Organized Health Care Education/Training Program

## 2020-05-15 ENCOUNTER — Encounter: Payer: Self-pay | Admitting: Student in an Organized Health Care Education/Training Program

## 2020-05-15 ENCOUNTER — Ambulatory Visit
Admission: RE | Admit: 2020-05-15 | Discharge: 2020-05-15 | Disposition: A | Discharge status: Home / Self Care | Payer: BC Managed Care – PPO | Source: Ambulatory Visit | Attending: Student in an Organized Health Care Education/Training Program | Admitting: Student in an Organized Health Care Education/Training Program

## 2020-05-15 VITALS — BP 117/72 | HR 87 | Temp 97.2°F | Resp 13 | Ht 64.0 in | Wt 239.0 lb

## 2020-05-15 DIAGNOSIS — G894 Chronic pain syndrome: Secondary | ICD-10-CM | POA: Diagnosis not present

## 2020-05-15 DIAGNOSIS — M47816 Spondylosis without myelopathy or radiculopathy, lumbar region: Secondary | ICD-10-CM | POA: Diagnosis not present

## 2020-05-15 DIAGNOSIS — M4317 Spondylolisthesis, lumbosacral region: Secondary | ICD-10-CM

## 2020-05-15 MED ORDER — DEXAMETHASONE SODIUM PHOSPHATE 10 MG/ML IJ SOLN
INTRAMUSCULAR | Status: AC
  Filled 2020-05-15: qty 2

## 2020-05-15 MED ORDER — LIDOCAINE HCL 2 % IJ SOLN
20.0000 mL | Freq: Once | INTRAMUSCULAR | Status: AC
  Administered 2020-05-15: 400 mg

## 2020-05-15 MED ORDER — ROPIVACAINE HCL 2 MG/ML IJ SOLN
INTRAMUSCULAR | Status: AC
  Filled 2020-05-15: qty 10

## 2020-05-15 MED ORDER — ROPIVACAINE HCL 2 MG/ML IJ SOLN
9.0000 mL | Freq: Once | INTRAMUSCULAR | Status: AC
  Administered 2020-05-15: 10 mL via PERINEURAL

## 2020-05-15 MED ORDER — LIDOCAINE HCL 2 % IJ SOLN
INTRAMUSCULAR | Status: AC
  Filled 2020-05-15: qty 20

## 2020-05-15 MED ORDER — DEXAMETHASONE SODIUM PHOSPHATE 10 MG/ML IJ SOLN
10.0000 mg | Freq: Once | INTRAMUSCULAR | Status: AC
  Administered 2020-05-15: 10 mg

## 2020-05-15 MED ORDER — FENTANYL CITRATE (PF) 100 MCG/2ML IJ SOLN
INTRAMUSCULAR | Status: AC
  Filled 2020-05-15: qty 2

## 2020-05-15 MED ORDER — FENTANYL CITRATE (PF) 100 MCG/2ML IJ SOLN: 25.0000 ug | INTRAMUSCULAR | Status: DC | PRN

## 2020-05-15 NOTE — Progress Notes (Signed)
Patient's Name: Kelly [name]  MRN: [id number]  Referring Provider: Steele Sizer, MD  DOB: Oct 25, 1965  PCP: Steele Sizer, MD  DOS: 05/15/2020  Note by: Gillis Santa, MD  Service setting: Ambulatory outpatient  Specialty: Interventional Pain Management  Patient type: Established  Location: [facility] (AMB) Pain Management Facility  Visit type: Interventional Procedure   Primary Reason for Visit: Interventional Pain Management Treatment. CC: Back Pain  Procedure:  Anesthesia, Analgesia, Anxiolysis:  Type: Therapeutic Medial Branch Facet Block with steroid #4 (#3 done 12/20/19) Region: Lumbar Level: L2/3, L3/4, L4/5 Laterality: Bilateral  Type: Local Anesthesia with Moderate (Conscious) Sedation Local Anesthetic: Lidocaine 1% Route: Intravenous (IV) IV Access: Secured Sedation: Meaningful verbal contact was maintained at all times during the procedure  Indication(s): Analgesia and Anxiety   Indications: 1. Lumbar spondylosis   2. Spondylolisthesis of lumbosacral region   3. Chronic pain syndrome    Pain Score: Pre-procedure:  (10 on left, 7 on right)/10 Post-procedure: 2  (left 2/10, right 0/10 patient moving and standing)/10  Pre-op Assessment:  Kelly [name] is a 54 y.o. (year old), female patient, seen today for interventional treatment. She  has a past surgical history that includes Tonsillectomy and adenoidectomy; Urethra dilation; Knee surgery (Left, 05/2011); Lumbar laminectomy/decompression microdiscectomy (N/A, 09/16/2016); Back surgery; Diagnostic laparoscopy; Dilation and curettage of uterus; Knee arthroscopy with medial menisectomy (Left, 01/14/2017); Colonoscopy with propofol (N/A, 02/03/2019); and Colonoscopy with propofol (N/A, 03/10/2019). Kelly [name] has a current medication list which includes the following prescription(s): buspirone, fluticasone, gabapentin, loratadine, mirtazapine, omeprazole, ropinirole, sumatriptan, topiramate, venlafaxine xr, and venlafaxine xr, and  the following Facility-Administered Medications: fentanyl. Her primarily concern today is the Back Pain  Initial Vital Signs: There were no vitals taken for this visit. BMI: Estimated body mass index is 41.02 kg/m as calculated from the following:   Height as of this encounter: 5\' 4"  (1.626 m).   Weight as of this encounter: 239 lb (108.4 kg).  Risk Assessment: Allergies: Reviewed. She is allergic to penicillins, chlorhexidine gluconate, ciprofloxacin, clindamycin/lincomycin, erythromycin, keflex [cephalexin], lyrica [pregabalin], nitrofurantoin monohyd macro, other, sulfa antibiotics, tetracyclines & related, adhesive [tape], latex, and vancomycin.  Allergy Precautions: None required Coagulopathies: Reviewed. None identified.  Blood-thinner therapy: None at this time Active Infection(s): Reviewed. None identified. Kelly [name] is afebrile  Site Confirmation: Kelly [name] was asked to confirm the procedure and laterality before marking the site Procedure checklist: Completed Consent: Before the procedure and under the influence of no sedative(s), amnesic(s), or anxiolytics, the patient was informed of the treatment options, risks and possible complications. To fulfill our ethical and legal obligations, as recommended by the American Medical Association's Code of Ethics, I have informed the patient of my clinical impression; the nature and purpose of the treatment or procedure; the risks, benefits, and possible complications of the intervention; the alternatives, including doing nothing; the risk(s) and benefit(s) of the alternative treatment(s) or procedure(s); and the risk(s) and benefit(s) of doing nothing. The patient was provided information about the general risks and possible complications associated with the procedure. These may include, but are not limited to: failure to achieve desired goals, infection, bleeding, organ or nerve damage, allergic reactions, paralysis, and death. In  addition, the patient was informed of those risks and complications associated to Spine-related procedures, such as failure to decrease pain; infection (i.e.: Meningitis, epidural or intraspinal abscess); bleeding (i.e.: epidural hematoma, subarachnoid hemorrhage, or any other type of intraspinal or peri-dural bleeding); organ or nerve damage (i.e.: Any type of peripheral nerve, nerve root, or  spinal cord injury) with subsequent damage to sensory, motor, and/or autonomic systems, resulting in permanent pain, numbness, and/or weakness of one or several areas of the body; allergic reactions; (i.e.: anaphylactic reaction); and/or death. Furthermore, the patient was informed of those risks and complications associated with the medications. These include, but are not limited to: allergic reactions (i.e.: anaphylactic or anaphylactoid reaction(s)); adrenal axis suppression; blood sugar elevation that in diabetics may result in ketoacidosis or comma; water retention that in patients with history of congestive heart failure may result in shortness of breath, pulmonary edema, and decompensation with resultant heart failure; weight gain; swelling or edema; medication-induced neural toxicity; particulate matter embolism and blood vessel occlusion with resultant organ, and/or nervous system infarction; and/or aseptic necrosis of one or more joints. Finally, the patient was informed that Medicine is not an exact science; therefore, there is also the possibility of unforeseen or unpredictable risks and/or possible complications that may result in a catastrophic outcome. The patient indicated having understood very clearly. We have given the patient no guarantees and we have made no promises. Enough time was given to the patient to ask questions, all of which were answered to the patient's satisfaction. Kelly [name] has indicated that she wanted to continue with the procedure. Attestation: I, the ordering provider, attest that  I have discussed with the patient the benefits, risks, side-effects, alternatives, likelihood of achieving goals, and potential problems during recovery for the procedure that I have provided informed consent. Date: 05/15/2020; Time: 11:02 AM  Pre-Procedure Preparation:  Monitoring: As per clinic protocol. Respiration, ETCO2, SpO2, BP, heart rate and rhythm monitor placed and checked for adequate function Safety Precautions: Patient was assessed for positional comfort and pressure points before starting the procedure. Time-out: I initiated and conducted the "Time-out" before starting the procedure, as per protocol. The patient was asked to participate by confirming the accuracy of the "Time Out" information. Verification of the correct person, site, and procedure were performed and confirmed by me, the nursing staff, and the patient. "Time-out" conducted as per Joint Commission's Universal Protocol (UP.01.01.01). "Time-out" Date & Time: 05/15/2020; 1302 hrs.  Description of Procedure Process:   Position: Prone Target Area: For Lumbar Facet blocks, the target is the groove formed by the junction of the transverse process and superior articular process. Approach: Paramedial approach. Area Prepped: Entire Posterior Lumbosacral Region Prepping solution: ChloraPrep (2% chlorhexidine gluconate and 70% isopropyl alcohol) Safety Precautions: Aspiration looking for blood return was conducted prior to all injections. At no point did we inject any substances, as a needle was being advanced. No attempts were made at seeking any paresthesias. Safe injection practices and needle disposal techniques used. Medications properly checked for expiration dates. SDV (single dose vial) medications used. Description of the Procedure: Protocol guidelines were followed. The patient was placed in position over the fluoroscopy table. The target area was identified and the area prepped in the usual manner. Skin desensitized  using vapocoolant spray. Skin & deeper tissues infiltrated with local anesthetic. Appropriate amount of time allowed to pass for local anesthetics to take effect. The procedure needle was introduced through the skin, ipsilateral to the reported pain, and advanced to the target area. Employing the "Medial Branch Technique", the needles were advanced to the angle made by the superior and medial portion of the transverse process, and the lateral and inferior portion of the superior articulating process of the targeted vertebral bodies. This area is known as "Burton's Eye" or the "Eye of the [place] Dog".  Negative  aspiration confirmed. Solution injected in intermittent fashion, asking for systemic symptoms every 0.5cc of injectate. The needles were then removed and the area cleansed, making sure to leave some of the prepping solution back to take advantage of its long term bactericidal properties.   Illustration of the posterior view of the lumbar spine and the posterior neural structures. Laminae of L2 through S1 are labeled. DPRL5, dorsal primary ramus of L5; DPRS1, dorsal primary ramus of S1; DPR3, dorsal primary ramus of L3; FJ, facet (zygapophyseal) joint L3-L4; I, inferior articular process of L4; LB1, lateral branch of dorsal primary ramus of L1; IAB, inferior articular branches from L3 medial branch (supplies L4-L5 facet joint); IBP, intermediate branch plexus; MB3, medial branch of dorsal primary ramus of L3; NR3, third lumbar nerve root; S, superior articular process of L5; SAB, superior articular branches from L4 (supplies L4-5 facet joint also); TP3, transverse process of L3.  Vitals:   05/15/20 1316 05/15/20 1326 05/15/20 1336 05/15/20 1346  BP: 120/63 105/62 118/82 117/72  Pulse:      Resp: 10 12 13 13   Temp:      SpO2: 100% 100% 100% 100%  Weight:      Height:        Start Time: 1302 hrs. End Time: 1316 hrs. Materials:  Needle(s) Type: Regular needle Gauge: 22G Length:  3.5-in Medication(s): We administered lidocaine, ropivacaine (PF) 2 mg/mL (0.2%), ropivacaine (PF) 2 mg/mL (0.2%), dexamethasone, and dexamethasone. Please see chart orders for dosing details. 6 cc solution made of 4 cc of 0.2% ropivacaine, 2 cc of Decadron (10 mg/cc).  1 cc injected at each level bilaterally.  Imaging Guidance (Spinal):  Type of Imaging Technique: Fluoroscopy Guidance (Spinal) Indication(s): Assistance in needle guidance and placement for procedures requiring needle placement in or near specific anatomical locations not easily accessible without such assistance. Exposure Time: Please see nurses notes. Contrast: None used. Fluoroscopic Guidance: I was personally present during the use of fluoroscopy. "Tunnel Vision Technique" used to obtain the best possible view of the target area. Parallax error corrected before commencing the procedure. "Direction-depth-direction" technique used to introduce the needle under continuous pulsed fluoroscopy. Once target was reached, antero-posterior, oblique, and lateral fluoroscopic projection used confirm needle placement in all planes. Images permanently stored in EMR. Interpretation: No contrast injected. I personally interpreted the imaging intraoperatively. Adequate needle placement confirmed in multiple planes. Permanent images saved into the patient's record.  Antibiotic Prophylaxis:  Indication(s): None identified Antibiotic given: None  Post-operative Assessment:  EBL: None Complications: No immediate post-treatment complications observed by team, or reported by patient. Note: The patient tolerated the entire procedure well. A repeat set of vitals were taken after the procedure and the patient was kept under observation following institutional policy, for this type of procedure. Post-procedural neurological assessment was performed, showing return to baseline, prior to discharge. The patient was provided with post-procedure discharge  instructions, including a section on how to identify potential problems. Should any problems arise concerning this procedure, the patient was given instructions to immediately contact us, at any time, without hesitation. In any case, we plan to contact the patient by telephone for a follow-up status report regarding this interventional procedure. Comments:  No additional relevant information. 5 out of 5 strength bilateral lower extremity: Plantar flexion, dorsiflexion, knee flexion, knee extension.  Plan of Care    Imaging Orders     DG PAIN CLINIC C-ARM 1-60 MIN NO REPORT Procedure Orders    No procedure(s) ordered today  Medications ordered for procedure: Meds ordered this encounter  Medications  . lidocaine (XYLOCAINE) 2 % (with pres) injection 400 mg  . fentaNYL (SUBLIMAZE) injection 25-50 mcg    Make sure Narcan is available in the pyxis when using this medication. In the event of respiratory depression (RR< 8/min): Titrate NARCAN (naloxone) in increments of 0.1 to 0.2 mg IV at 2-3 minute intervals, until desired degree of reversal.  . ropivacaine (PF) 2 mg/mL (0.2%) (NAROPIN) injection 9 mL  . ropivacaine (PF) 2 mg/mL (0.2%) (NAROPIN) injection 9 mL  . dexamethasone (DECADRON) injection 10 mg  . dexamethasone (DECADRON) injection 10 mg   Medications administered: We administered lidocaine, ropivacaine (PF) 2 mg/mL (0.2%), ropivacaine (PF) 2 mg/mL (0.2%), dexamethasone, and dexamethasone.  See the medical record for exact dosing, route, and time of administration.  New Prescriptions   No medications on file   Disposition: Discharge home  Discharge Date & Time: 05/15/2020; 1350 hrs.   Physician-requested Follow-up: Return in about 5 weeks (around 06/19/2020) for Post Procedure Evaluation, virtual. Future Appointments  Date Time Provider [place]  06/12/2020  3:30 PM Ursula Alert, MD [facility] None  06/19/2020  3:00 PM Gillis Santa, MD [facility] None  10/11/2020  11:00 AM Steele Sizer, MD [facility]   Primary Care Physician: Steele Sizer, MD Location: [facility] Note by: Gillis Santa, MD Date: 05/15/2020; Time: 2:38 PM  Disclaimer:  Medicine is not an exact science. The only guarantee in medicine is that nothing is guaranteed. It is important to note that the decision to proceed with this intervention was based on the information collected from the patient. The Data and conclusions were drawn from the patient's questionnaire, the interview, and the physical examination. Because the information was provided in large part by the patient, it cannot be guaranteed that it has not been purposely or unconsciously manipulated. Every effort has been made to obtain as much relevant data as possible for this evaluation. It is important to note that the conclusions that lead to this procedure are derived in large part from the available data. Always take into account that the treatment will also be dependent on availability of resources and existing treatment guidelines, considered by other Pain Management Practitioners as being common knowledge and practice, at the time of the intervention. For Medico-Legal purposes, it is also important to point out that variation in procedural techniques and pharmacological choices are the acceptable norm. The indications, contraindications, technique, and results of the above procedure should only be interpreted and judged by a Board-Certified Interventional Pain Specialist with extensive familiarity and expertise in the same exact procedure and technique.

## 2020-05-15 NOTE — Patient Instructions (Signed)

## 2020-05-15 NOTE — Progress Notes (Signed)
Safety precautions to be maintained throughout the outpatient stay will include: orient to surroundings, keep bed in low position, maintain call bell within reach at all times, provide assistance with transfer out of bed and ambulation.  

## 2020-05-16 ENCOUNTER — Telehealth: Payer: Self-pay

## 2020-05-16 NOTE — Telephone Encounter (Signed)
Post procedure follow up call. LM

## 2020-05-22 ENCOUNTER — Emergency Department
Admission: EM | Admit: 2020-05-22 | Discharge: 2020-05-23 | Disposition: A | Discharge status: Home / Self Care | Payer: BC Managed Care – PPO | Attending: Emergency Medicine | Admitting: Emergency Medicine

## 2020-05-22 ENCOUNTER — Emergency Department: Payer: BC Managed Care – PPO

## 2020-05-22 ENCOUNTER — Other Ambulatory Visit: Payer: Self-pay

## 2020-05-22 ENCOUNTER — Telehealth: Payer: Self-pay

## 2020-05-22 ENCOUNTER — Encounter: Payer: Self-pay | Admitting: Emergency Medicine

## 2020-05-22 DIAGNOSIS — K219 Gastro-esophageal reflux disease without esophagitis: Secondary | ICD-10-CM | POA: Insufficient documentation

## 2020-05-22 DIAGNOSIS — N939 Abnormal uterine and vaginal bleeding, unspecified: Secondary | ICD-10-CM

## 2020-05-22 DIAGNOSIS — R1030 Lower abdominal pain, unspecified: Secondary | ICD-10-CM | POA: Diagnosis not present

## 2020-05-22 DIAGNOSIS — Z91040 Latex allergy status: Secondary | ICD-10-CM | POA: Insufficient documentation

## 2020-05-22 DIAGNOSIS — K429 Umbilical hernia without obstruction or gangrene: Secondary | ICD-10-CM | POA: Diagnosis not present

## 2020-05-22 DIAGNOSIS — R110 Nausea: Secondary | ICD-10-CM | POA: Insufficient documentation

## 2020-05-22 DIAGNOSIS — Z87891 Personal history of nicotine dependence: Secondary | ICD-10-CM | POA: Diagnosis not present

## 2020-05-22 DIAGNOSIS — N83202 Unspecified ovarian cyst, left side: Secondary | ICD-10-CM | POA: Diagnosis not present

## 2020-05-22 DIAGNOSIS — I878 Other specified disorders of veins: Secondary | ICD-10-CM | POA: Diagnosis not present

## 2020-05-22 DIAGNOSIS — N83292 Other ovarian cyst, left side: Secondary | ICD-10-CM | POA: Diagnosis not present

## 2020-05-22 LAB — URINALYSIS, COMPLETE (UACMP) WITH MICROSCOPIC
Bilirubin Urine: NEGATIVE
Glucose, UA: NEGATIVE mg/dL
Hgb urine dipstick: NEGATIVE
Ketones, ur: NEGATIVE mg/dL
Nitrite: NEGATIVE
Protein, ur: 30 mg/dL — AB
Specific Gravity, Urine: 1.025 (ref 1.005–1.030)
pH: 5 (ref 5.0–8.0)

## 2020-05-22 LAB — COMPREHENSIVE METABOLIC PANEL
ALT: 14 U/L (ref 0–44)
AST: 18 U/L (ref 15–41)
Albumin: 4.1 g/dL (ref 3.5–5.0)
Alkaline Phosphatase: 63 U/L (ref 38–126)
Anion gap: 10 (ref 5–15)
BUN: 14 mg/dL (ref 6–20)
CO2: 18 mmol/L — ABNORMAL LOW (ref 22–32)
Calcium: 8.9 mg/dL (ref 8.9–10.3)
Chloride: 108 mmol/L (ref 98–111)
Creatinine, Ser: 0.82 mg/dL (ref 0.44–1.00)
GFR, Estimated: >60 mL/min (ref >60)
Glucose, Bld: 101 mg/dL — ABNORMAL HIGH (ref 70–99)
Potassium: 3.8 mmol/L (ref 3.5–5.1)
Sodium: 136 mmol/L (ref 135–145)
Total Bilirubin: 0.7 mg/dL (ref 0.3–1.2)
Total Protein: 7.1 g/dL (ref 6.5–8.1)

## 2020-05-22 LAB — LIPASE, BLOOD: Lipase: 23 U/L (ref 11–51)

## 2020-05-22 LAB — CBC
HCT: 41.8 % (ref 36.0–46.0)
Hemoglobin: 13.7 g/dL (ref 12.0–15.0)
MCH: 29.5 pg (ref 26.0–34.0)
MCHC: 32.8 g/dL (ref 30.0–36.0)
MCV: 90.1 fL (ref 80.0–100.0)
Platelets: 305 10*3/uL (ref 150–400)
RBC: 4.64 MIL/uL (ref 3.87–5.11)
RDW: 13.2 % (ref 11.5–15.5)
WBC: 12.3 10*3/uL — ABNORMAL HIGH (ref 4.0–10.5)
nRBC: 0 % (ref 0.0–0.2)

## 2020-05-22 LAB — WET PREP, GENITAL
Clue Cells Wet Prep HPF POC: NONE SEEN
Sperm: NONE SEEN
Trich, Wet Prep: NONE SEEN
WBC, Wet Prep HPF POC: NONE SEEN
Yeast Wet Prep HPF POC: NONE SEEN

## 2020-05-22 MED ORDER — LACTATED RINGERS IV BOLUS
1000.0000 mL | Freq: Once | INTRAVENOUS | Status: AC
  Administered 2020-05-22: 1000 mL via INTRAVENOUS

## 2020-05-22 MED ORDER — HYDROMORPHONE HCL 1 MG/ML IJ SOLN
0.5000 mg | Freq: Once | INTRAMUSCULAR | Status: AC
  Administered 2020-05-22: 0.5 mg via INTRAVENOUS
  Filled 2020-05-22: qty 1

## 2020-05-22 NOTE — ED Notes (Signed)
No answer when called several times from lobby 

## 2020-05-22 NOTE — ED Provider Notes (Signed)
-----------------------------------------   11:08 PM on 05/22/2020 -----------------------------------------  Assuming care from Dr. Charna Archer.  In short, Kelly [name] is a 54 y.o. female with a chief complaint of abdominal pain.  Refer to the original H&P for additional details.  The current plan of care is to reassess after CT abd/pelvis.  Anticipate home if no acute/emergent findings identified.   ----------------------------------------- 2:03 AM on 05/23/2020 -----------------------------------------  No emergent findings on CT scan.  The patient has an ovarian cyst and I have dated the discharge instructions prepared by Dr. Charna Archer to reflect the cyst but there is no indication that the relatively small cyst is the cause of her chronic abdominal pain.  I updated the patient and she reports that she is still having pain but it is improved from prior.  I believe that she is appropriate for discharge and outpatient follow-up in the absence of any evidence of an emergent medical condition and pain consistent with ongoing chronic abdominal pain.  She also reports mild nausea and this, associated with the chronic abdominal pain, suggest to me that she would benefit from a dose of droperidol 2.5 mg IV prior to discharge as it has been shown to be effective with chronic pain as well as nausea/vomiting.  I updated her with this plan and will discharge her for close outpatient follow-up as per Dr. Grover Canavan recommendations/AVS.   Hinda Kehr, MD 05/23/20 [phone]

## 2020-05-22 NOTE — ED Triage Notes (Signed)
Pt comes into the ED via POV c/o lower abdominal pain that has been getting worse over a week.  Pt admits to some nausea, but denies emesis or diarrhea.  Pt tearful in triage at this time. Denies any SHOB or dizziness.  Pt states her stomach is tender to the touch. Denies any difficulty urinating, but patient states she has been tested positive for e-coli in her urine (1 month ago )

## 2020-05-22 NOTE — ED Notes (Signed)
Charge nurse out to retrieve pt for exam room and to speak with spouse

## 2020-05-22 NOTE — ED Notes (Signed)
Pt sitting in lobby in w/c with no distress; spouse with pt; instr spouse to wait outside until an exam room is ready which should be very soon; spouse angry, pointing in nurse's face and st he will not leave; security over to escort spouse outside

## 2020-05-22 NOTE — ED Notes (Signed)
No answer when called several times from lobby & cell phone

## 2020-05-22 NOTE — ED Provider Notes (Signed)
[facility] Emergency Department Provider Note   ____________________________________________   First MD Initiated Contact with Patient 05/22/20 2159     (approximate)  I have reviewed the triage vital signs and the nursing notes.   HISTORY  Chief Complaint Abdominal Pain    HPI Kelly [name] is a 54 y.o. female with PMH of Migraines, GERD, arthritis, and chronic pain syndrome who presents to the ED complaining of abdominal pain. Patient reports initially developing BLQ abdominal pain 2 months ago, when she was seen at [facility]. She had follow-up with her OBGYN, who she states dilated her cervix to allow blood to pass, which ended up improving her symptoms. Pain has since come back and been more severe over the past which. She reports BLQ abdominal pain that is sharp and not exacerbated or alleviated by anything. She has had some nausea but denies vomiting and has not had any changes in her bowel movements. She reports vaginal spotting but denies discharge or urinary symptoms.        Past Medical History:  Diagnosis Date  . Allergy   . Anxiety   . Depression   . GERD (gastroesophageal reflux disease)   . History of kidney stones   . Insomnia   . Intermittent low back pain   . Migraines   . Osteoarthritis   . Recurrent UTI   . Restless leg syndrome   . Sciatica of right side   . Symptomatic menopausal or female climacteric states   . Vertigo   . Vitamin D deficiency     Patient Active Problem List   Diagnosis Date Noted  . Chronic pain syndrome 12/14/2019  . Lumbar spondylosis 05/01/2019  . Cervicalgia 05/01/2019  . Encounter for screening colonoscopy   . Polyp of sigmoid colon   . GAD (generalized anxiety disorder) 02/28/2019  . Panic disorder 02/28/2019  . Insomnia due to mental condition 02/28/2019  . Morbid obesity ([place]) 07/26/2017  . Spondylolisthesis of lumbosacral region 02/11/2016  . Hematuria 04/09/2015  .  Hyperglycemia 04/06/2015  . Right lumbar radiculitis 12/28/2014  . MDD (major depressive disorder), recurrent episode, moderate ([place]) 12/27/2014  . Gastro-esophageal reflux disease without esophagitis 12/27/2014  . Bulge of lumbar disc without myelopathy 12/27/2014  . Dysmetabolic syndrome 17/00/1749  . Migraine without aura and responsive to treatment 12/27/2014  . Osteoarthrosis 12/27/2014  . Obesity (BMI 30-39.9) 12/27/2014  . Restless leg 12/27/2014  . Allergic rhinitis, seasonal 12/27/2014    Past Surgical History:  Procedure Laterality Date  . BACK SURGERY    . COLONOSCOPY WITH PROPOFOL N/A 02/03/2019   Procedure: COLONOSCOPY WITH PROPOFOL;  Surgeon: Jonathon Bellows, MD;  Location: [facility] ENDOSCOPY;  Service: Gastroenterology;  Laterality: N/A;  . COLONOSCOPY WITH PROPOFOL N/A 03/10/2019   Procedure: COLONOSCOPY WITH PROPOFOL;  Surgeon: Jonathon Bellows, MD;  Location: [facility] ENDOSCOPY;  Service: Gastroenterology;  Laterality: N/A;  . DIAGNOSTIC LAPAROSCOPY    . DILATION AND CURETTAGE OF UTERUS    . KNEE ARTHROSCOPY WITH MEDIAL MENISECTOMY Left 01/14/2017   Procedure: KNEE ARTHROSCOPY WITH MEDIAL AND LATERAL MENISECTOMY;  Surgeon: Hessie Knows, MD;  Location: [facility] ORS;  Service: Orthopedics;  Laterality: Left;  Partial Knee menisectomy  . KNEE SURGERY Left 05/2011   Dr. Rudene Christians- Arthroscopic  . LUMBAR LAMINECTOMY/DECOMPRESSION MICRODISCECTOMY N/A 09/16/2016   Procedure: LUMBAR LAMINECTOMY/DECOMPRESSION MICRODISCECTOMY 1 LEVEL L5-S1;  Surgeon: Meade Maw, MD;  Location: [facility] ORS;  Service: Neurosurgery;  Laterality: N/A;  . TONSILLECTOMY AND ADENOIDECTOMY    . URETHRAL STRICTURE  DILATATION      Prior to Admission medications   Medication Sig Start Date End Date Taking? Authorizing Provider  busPIRone (BUSPAR) 7.5 MG tablet Take 1 tablet (7.5 mg total) by mouth daily. 03/20/20   Ursula Alert, MD  fluticasone (FLONASE) 50 MCG/ACT nasal spray Place 2 sprays into both nostrils daily. 10/12/19    Steele Sizer, MD  gabapentin (NEURONTIN) 300 MG capsule 300 mg qAM, 600 mg QHS 04/30/20   Gillis Santa, MD  loratadine (CLARITIN) 10 MG tablet Take 1 tablet (10 mg total) by mouth daily. 10/12/19   Steele Sizer, MD  mirtazapine (REMERON) 7.5 MG tablet Take 1 tablet (7.5 mg total) by mouth at bedtime. For sleep and anxiety 03/20/20   Ursula Alert, MD  omeprazole (PRILOSEC) 40 MG capsule Take 1 capsule (40 mg total) by mouth every morning. 04/12/20   Steele Sizer, MD  rOPINIRole (REQUIP) 1 MG tablet Take 1 tablet (1 mg total) by mouth at bedtime. 04/12/20   Steele Sizer, MD  SUMAtriptan (IMITREX) 100 MG tablet Take 1 tablet (100 mg total) by mouth every 2 (two) hours as needed for migraine. May repeat in 2 hours if headache persists or recurs. 06/20/19   Steele Sizer, MD  topiramate (TOPAMAX) 100 MG tablet Take 1 tablet (100 mg total) by mouth every evening. 04/12/20   Steele Sizer, MD  venlafaxine XR (EFFEXOR-XR) 150 MG 24 hr capsule Take 1 capsule (150 mg total) by mouth daily with breakfast. 01/29/20   Ursula Alert, MD  venlafaxine XR (EFFEXOR-XR) 37.5 MG 24 hr capsule Take 1 capsule (37.5 mg total) by mouth daily with breakfast. To be combined with 150 mg 03/20/20   Ursula Alert, MD    Allergies Penicillins, Chlorhexidine gluconate, Ciprofloxacin, Clindamycin/lincomycin, Erythromycin, Keflex [cephalexin], Lyrica [pregabalin], Nitrofurantoin monohyd macro, Other, Sulfa antibiotics, Tetracyclines & related, Adhesive [tape], Latex, and Vancomycin  Family History  Problem Relation Age of Onset  . Cancer Paternal Grandmother   . Anxiety disorder Cousin   . Depression Cousin     Social History Social History   Tobacco Use  . Smoking status: Former Smoker    Packs/day: 0.75    Years: 2.00    Pack years: 1.50    Types: Cigarettes    Start date: 07/06/1981    Quit date: 07/07/1983    Years since quitting: 36.9  . Smokeless tobacco: Never Used  Vaping Use  . Vaping Use:  Never used  Substance Use Topics  . Alcohol use: No    Alcohol/week: 0.0 standard drinks  . Drug use: No    Review of Systems  Constitutional: No fever/chills Eyes: No visual changes. ENT: No sore throat. Cardiovascular: Denies chest pain. Respiratory: Denies shortness of breath. Gastrointestinal: Positive for abdominal pain.  Positive for nausea, no vomiting.  No diarrhea.  No constipation. Genitourinary: Negative for dysuria. Positive for vaginal bleeding. Musculoskeletal: Negative for back pain. Skin: Negative for rash. Neurological: Negative for headaches, focal weakness or numbness.  ____________________________________________   PHYSICAL EXAM:  VITAL SIGNS: ED Triage Vitals  Enc Vitals Group     BP 05/22/20 1734 (!) 152/70     Pulse Rate 05/22/20 1734 79     Resp 05/22/20 1734 (!) 24     Temp 05/22/20 1734 98.3 F (36.8 C)     Temp Source 05/22/20 1734 Oral     SpO2 05/22/20 1734 98 %     Weight 05/22/20 1735 239 lb (108.4 kg)     Height 05/22/20 1735 5\' 4"  (1.626  m)     Head Circumference --      Peak Flow --      Pain Score 05/22/20 1734 10     Pain Loc --      Pain Edu? --      Excl. in [place]? --     Constitutional: Alert and oriented. Eyes: Conjunctivae are normal. Head: Atraumatic. Nose: No congestion/rhinnorhea. Mouth/Throat: Mucous membranes are moist. Neck: Normal ROM Cardiovascular: Normal rate, regular rhythm. Grossly normal heart sounds. Respiratory: Normal respiratory effort.  No retractions. Lungs CTAB. Gastrointestinal: Soft and tender to palpation in bilateral lower quadrants. No distention. Genitourinary: No bleeding or discharge noted, no cervical motion or adnexal tenderness. Musculoskeletal: No lower extremity tenderness nor edema. Neurologic:  Normal speech and language. No gross focal neurologic deficits are appreciated. Skin:  Skin is warm, dry and intact. No rash noted. Psychiatric: Mood and affect are normal. Speech and behavior are  normal.  ____________________________________________   LABS (all labs ordered are listed, but only abnormal results are displayed)  Labs Reviewed  COMPREHENSIVE METABOLIC PANEL - Abnormal; Notable for the following components:      Result Value   CO2 18 (*)    Glucose, Bld 101 (*)    All other components within normal limits  CBC - Abnormal; Notable for the following components:   WBC 12.3 (*)    All other components within normal limits  URINALYSIS, COMPLETE (UACMP) WITH MICROSCOPIC - Abnormal; Notable for the following components:   Color, Urine AMBER (*)    APPearance CLOUDY (*)    Protein, ur 30 (*)    Leukocytes,Ua LARGE (*)    Bacteria, UA MANY (*)    All other components within normal limits  CHLAMYDIA/NGC RT PCR ([facility] ONLY)  WET PREP, GENITAL  URINE CULTURE  LIPASE, BLOOD    PROCEDURES  Procedure(s) performed (including Critical Care):  Procedures   ____________________________________________   INITIAL IMPRESSION / ASSESSMENT AND PLAN / ED COURSE       54 year-old female with PMH of migraines, GERD, arthritis, and chronic pain syndrome who presents to the ED with once week of worsening lower abdominal pain with vaginal spotting. She has BLQ tenderness but an unremarkable pelvic exam without bleeding or tenderness. Labs thus far unremarkable and we will further assess with CT scan. UA is borderline for infection and in further discussion with patient, we will hold off on treatment in favor of culturing urine and waiting for results, especially given patient's numerous antibiotic allergies.  Patient turned over to oncoming provider pending CT results.      ____________________________________________   FINAL CLINICAL IMPRESSION(S) / ED DIAGNOSES  Final diagnoses:  Lower abdominal pain  Vaginal bleeding     ED Discharge Orders    None       Note:  This document was prepared using Dragon voice recognition software and may include  unintentional dictation errors.   Blake Divine, MD 05/23/20 [phone]

## 2020-05-22 NOTE — ED Notes (Addendum)
Pt's spouse to STAT desk to inquire over wait time; informed that pt was called several times inc on her cell phone with no answer; reports that they are sitting outside and did not have her cell phone; pt placed back on the wait board; spouse voices understanding

## 2020-05-22 NOTE — Telephone Encounter (Signed)
Pt tx'd from front desk; crying; in a lot of pain; started spotting yesterday and is a little heavier today.  Pain is so bad she can hardly walk.  Adv to go back to the ED.  Pt states she is ready to do D&C asap; she doesn't want to hurt like this again.  Adv will let AMS know.

## 2020-05-23 ENCOUNTER — Encounter: Payer: Self-pay | Admitting: Radiology

## 2020-05-23 DIAGNOSIS — R110 Nausea: Secondary | ICD-10-CM | POA: Diagnosis not present

## 2020-05-23 DIAGNOSIS — I878 Other specified disorders of veins: Secondary | ICD-10-CM | POA: Diagnosis not present

## 2020-05-23 DIAGNOSIS — N83292 Other ovarian cyst, left side: Secondary | ICD-10-CM | POA: Diagnosis not present

## 2020-05-23 DIAGNOSIS — K429 Umbilical hernia without obstruction or gangrene: Secondary | ICD-10-CM | POA: Diagnosis not present

## 2020-05-23 LAB — CHLAMYDIA/NGC RT PCR (ARMC ONLY)
Chlamydia Tr: NOT DETECTED
N gonorrhoeae: NOT DETECTED

## 2020-05-23 MED ORDER — IOHEXOL 300 MG/ML  SOLN
100.0000 mL | Freq: Once | INTRAMUSCULAR | Status: AC | PRN
  Administered 2020-05-23: 100 mL via INTRAVENOUS

## 2020-05-23 MED ORDER — DROPERIDOL 2.5 MG/ML IJ SOLN
2.5000 mg | Freq: Once | INTRAMUSCULAR | Status: AC
  Administered 2020-05-23: 2.5 mg via INTRAVENOUS
  Filled 2020-05-23: qty 2

## 2020-05-23 MED ORDER — OXYCODONE-ACETAMINOPHEN 5-325 MG PO TABS
1.0000 | ORAL_TABLET | ORAL | 0 refills | Status: DC | PRN
Start: 2020-05-23 — End: 2020-06-06

## 2020-05-23 MED ORDER — KETOROLAC TROMETHAMINE 30 MG/ML IJ SOLN
15.0000 mg | Freq: Once | INTRAMUSCULAR | Status: AC
  Administered 2020-05-23: 15 mg via INTRAVENOUS
  Filled 2020-05-23: qty 1

## 2020-05-23 NOTE — Discharge Instructions (Addendum)

## 2020-05-24 ENCOUNTER — Telehealth: Payer: Self-pay

## 2020-05-24 DIAGNOSIS — F331 Major depressive disorder, recurrent, moderate: Secondary | ICD-10-CM

## 2020-05-24 DIAGNOSIS — F410 Panic disorder [episodic paroxysmal anxiety] without agoraphobia: Secondary | ICD-10-CM

## 2020-05-24 DIAGNOSIS — F411 Generalized anxiety disorder: Secondary | ICD-10-CM

## 2020-05-24 DIAGNOSIS — F5105 Insomnia due to other mental disorder: Secondary | ICD-10-CM

## 2020-05-24 MED ORDER — VENLAFAXINE HCL ER 75 MG PO CP24: 75.0000 mg | ORAL_CAPSULE | Freq: Every day | ORAL | 1 refills | Status: DC

## 2020-05-24 MED ORDER — PRAZOSIN HCL 1 MG PO CAPS: 1.0000 mg | ORAL_CAPSULE | Freq: Every day | ORAL | 0 refills | Status: DC

## 2020-05-24 MED ORDER — VENLAFAXINE HCL ER 150 MG PO CP24: 150.0000 mg | ORAL_CAPSULE | Freq: Every day | ORAL | 1 refills | Status: DC

## 2020-05-24 NOTE — Telephone Encounter (Signed)
pt states that she went to the er last week and she was left out in the cold and they forgot that she was there. she got a ct, and she told them she was cold and she was made fun of and they push strecher really fast. And upon discharge she had been in the dark resting and nurse come in said her ride was here and turn on the lights and didn't even try to help her get dress. She states that the experience set off her anxiety and depression and her PTSD she having dreams again. I asked her if she filed a complaint at the hospital and she stated that she had. Pt states that she went to er because she was having hip / abdomen pain and end up much worst after it do to her experience.  She states she needs refill on her effexor 37.5 and if she can have some to help with her anxiety and ptsd.

## 2020-05-24 NOTE — Telephone Encounter (Signed)
Returned call to patient.  She reports she had a bad experience at the emergency department recently.  Because of that her PTSD symptoms have worsened.  She has been having nightmares.  She had started taking the prazosin which was prescribed in the past.  That does seem to help.  She continues to struggle with anxiety symptoms.  She was feeling unsafe leaving her house.  However she is at a friend's house today and reports this is the only place she feels safe other than her house.  She agrees to continue to work with her therapist.  She has reached out to the chaplain and is currently trying to work with patient relations about the bad experience that she had during her emergency department visit.  Discussed increasing her venlafaxine.  She is agreeable.  Will increase venlafaxine to 225 mg p.o. daily.  Even though she had sluggishness on the higher dosage in the past she feels she has found a way to take it and that does help.  Patient denies any suicidality.  She has upcoming appointment with Probation officer.

## 2020-05-25 LAB — URINE CULTURE: Culture: >=100000 — AB

## 2020-05-26 NOTE — Progress Notes (Signed)
ED Antimicrobial Stewardship Positive Culture Follow Up   Kelly [name] is an 54 y.o. female who presented to [facility] on 05/22/2020 with a chief complaint of  Chief Complaint  Patient presents with  . Abdominal Pain    Recent Results (from the past 720 hour(s))  Urine culture     Status: Abnormal   Collection Time: 05/22/20  5:36 PM   Specimen: Urine, Random  Result Value Ref Range Status   Specimen Description   Final    URINE, RANDOM Performed at [facility], [address], [place], [place] [address]    Special Requests   Final    NONE Performed at [facility], [place] [place], [place] [address]    Culture >=100,000 COLONIES/mL ESCHERICHIA COLI (A)  Final   Report Status 05/25/2020 FINAL  Final   Organism ID, Bacteria ESCHERICHIA COLI (A)  Final      Susceptibility   Escherichia coli - MIC*    AMPICILLIN 16 INTERMEDIATE Intermediate     CEFAZOLIN <=4 SENSITIVE Sensitive     CEFEPIME <=0.12 SENSITIVE Sensitive     CEFTRIAXONE <=0.25 SENSITIVE Sensitive     CIPROFLOXACIN <=0.25 SENSITIVE Sensitive     GENTAMICIN <=1 SENSITIVE Sensitive     IMIPENEM <=0.25 SENSITIVE Sensitive     NITROFURANTOIN <=16 SENSITIVE Sensitive     TRIMETH/SULFA <=20 SENSITIVE Sensitive     AMPICILLIN/SULBACTAM 4 SENSITIVE Sensitive     PIP/TAZO <=4 SENSITIVE Sensitive     * >=100,000 COLONIES/mL ESCHERICHIA COLI  Chlamydia/NGC rt PCR ([facility] only)     Status: None   Collection Time: 05/22/20 10:27 PM   Specimen: Cervical/Vaginal swab  Result Value Ref Range Status   Specimen source GC/Chlam VAGINA  Final   Chlamydia Tr NOT DETECTED NOT DETECTED Final   N gonorrhoeae NOT DETECTED NOT DETECTED Final    Comment: (NOTE) This CT/NG assay has not been evaluated in patients with a history of  hysterectomy. Performed at [facility] [address], [place] [place], [place] [address]   Wet prep, genital     Status: None   Collection Time: 05/22/20 10:27 PM    Specimen: Cervical/Vaginal swab  Result Value Ref Range Status   Yeast Wet Prep HPF POC NONE SEEN NONE SEEN Final   Trich, Wet Prep NONE SEEN NONE SEEN Final   Clue Cells Wet Prep HPF POC NONE SEEN NONE SEEN Final   WBC, Wet Prep HPF POC NONE SEEN NONE SEEN Final   Sperm NONE SEEN  Final    Comment: Performed at [facility], [address], [place], [place] [address]    [x]  Patient discharged originally without antimicrobial agent and treatment is now indicated  New antibiotic prescription: cefdinir 300 mg BID x 5 days  ED Provider: Siadecki  Pt presented with abdominal pain and vaginal spotting. Pt denied urinary symptoms during ED visit. Pt reported positive urine culture growing e. Coli a month ago while at [facility] that was treated by PCP. Unable to find which antibiotic pt received. EDP wants to treat - recommended cefdinir 300 mg BID x 5 days - EDP agreed. New prescription called in to [facility].   Benn Moulder, PharmD Pharmacy Resident  05/26/2020 3:05 PM

## 2020-05-27 ENCOUNTER — Telehealth: Payer: Self-pay

## 2020-05-27 ENCOUNTER — Other Ambulatory Visit: Payer: Self-pay

## 2020-05-27 ENCOUNTER — Ambulatory Visit (INDEPENDENT_AMBULATORY_CARE_PROVIDER_SITE_OTHER): Payer: BC Managed Care – PPO | Admitting: Obstetrics and Gynecology

## 2020-05-27 ENCOUNTER — Encounter: Payer: Self-pay | Admitting: Obstetrics and Gynecology

## 2020-05-27 ENCOUNTER — Ambulatory Visit: Payer: Self-pay

## 2020-05-27 VITALS — Ht 64.0 in | Wt 234.0 lb

## 2020-05-27 DIAGNOSIS — N83209 Unspecified ovarian cyst, unspecified side: Secondary | ICD-10-CM | POA: Diagnosis not present

## 2020-05-27 DIAGNOSIS — N950 Postmenopausal bleeding: Secondary | ICD-10-CM | POA: Diagnosis not present

## 2020-05-27 DIAGNOSIS — R102 Pelvic and perineal pain: Secondary | ICD-10-CM

## 2020-05-27 NOTE — Telephone Encounter (Signed)
Patient called stating that she has had issues with vaginal bleeding post menopause. She states that she had office procedure in September by her OBGYN.  She states that it took away her bleeding until last Wednesday.  She states that she went to the ER for severe abdominal pain.  She states that her bleeding was mild.  She is calling today because her bleeding is increasing bright red Still just one pad but feels that it is increasing.  She has severe abdominal pain that radiates into her back and hips.  She states that she has had some type of a block for back and hip pain. She states that the ER found E coli in her urine and has prescribed medication she has not picked up as of today. She has no fever but has started feeling some dizziness. Per protocol patiant will go to ER for evaluation.  She states that she has call in to her OBGYN but he is on vacation this week. Care advice read to patient and she agrees with plan of care. Reason for Disposition . SEVERE abdominal pain  Answer Assessment - Initial Assessment Questions 1. AMOUNT: "Describe the bleeding that you are having." "How much bleeding is there?"    - SPOTTING: spotting, or pinkish / brownish mucous discharge; does not fill panti-liner or pad    - MILD:  less than 1 pad / hour; less than patient's usual menstrual bleeding   - MODERATE: 1-2 pads / hour; 1 menstrual cup every 6 hours; small-medium blood clots (e.g., pea, grape, small coin)   - SEVERE: soaking 2 or more pads/hour for 2 or more hours; 1 menstrual cup every 2 hours; bleeding not contained by pads or continuous red blood from vagina; large blood clots (e.g., golf ball, large coin)      Mild bright red 2. ONSET: "When did the bleeding begin?" "Is it continuing now?"     Last Wednesday getting to moderate 3. MENOPAUSE: "When was your last menstrual period?"      1 year 4. ABDOMINAL PAIN: "Do you have any pain?" "How bad is the pain?"  (e.g., Scale 1-10; mild, moderate, or  severe)   - MILD (1-3): doesn't interfere with normal activities, abdomen soft and not tender to touch    - MODERATE (4-7): interferes with normal activities or awakens from sleep, tender to touch    - SEVERE (8-10): excruciating pain, doubled over, unable to do any normal activities      10 bad heavy pressure 5. BLOOD THINNERS: "Do you take any blood thinners?" (e.g., Coumadin/warfarin, Pradaxa/dabigatran, aspirin)     none 6. HORMONES: "Are you taking any hormone medications, prescription or OTC?" (e.g., birth control pills, estrogen)     none 7. CAUSE: "What do you think is causing the bleeding?" (e.g., recent gyn surgery, recent gyn procedure; known bleeding disorder, uterine cancer)       September has dilatation in office 8. HEMODYNAMIC STATUS: "Are you weak or feeling lightheaded?" If Yes, ask: "Can you stand and walk normally?"       A little dizzy 9. OTHER SYMPTOMS: "What other symptoms are you having with the bleeding?" (e.g., back pain, burning with urination, fever)     Radiating to back and hip, heavy pressure, no fever  Protocols used: VAGINAL BLEEDING - POSTMENOPAUSAL-A-AH

## 2020-05-27 NOTE — Telephone Encounter (Signed)
Patient is scheduled for 05/27/20 3:10 with [facility]

## 2020-05-27 NOTE — Telephone Encounter (Signed)
Pt calling triage needing a f/u from ER with AMS as soon as possible. Please help schedule

## 2020-05-27 NOTE — H&P (View-Only) (Signed)
Patient ID: Kelly [name], female   DOB: 09-14-1965, 54 y.o.   MRN: [id number]  Reason for Consult: Gynecologic Exam (post menopausal bleeding)   Referred by Steele Sizer, MD  Subjective:     HPI:  Kelly [name] is a 54 y.o. female patient is here today with complaints of severe pain and bleeding.  She reports that this pain started in August but has become more severe recently.  She reports that she saw Dr. Georgianne Fick after a visit to the [facility] ED where she had a pelvic ultrasound and CT.  She reports that those ultrasounds and CT showed a mass.  She reports that when she followed up with Dr. Georgianne Fick she had dilation of the cervix in the office which revealed a heme endometrium.  She had a endometrial biopsy which showed a polyp without hyperplasia.  She reports that on Wednesday the patient called and requested visit.  She was worked into the schedule but she missed the appointment.  She was however seen in the ER.  She reports last Wednesday she is only having a small amount of spotting but now she has began having heavier bleeding.  She reports that she is having to change her pad every hour and the pad is generally saturated.  She reports that the pain is worse and hurts with walking or standing.  It feels like something is dropped out of her.  She reports pressure in her hips and her back.  Gynecological History Menarche: 88 Menopause: 2020 Last pap smear: 2021 ASCUS HPV negative Last Mammogram: 2015 History of STDs: None Sexually Active: Yes   Past Medical History:  Diagnosis Date  . Allergy   . Anxiety   . Depression   . GERD (gastroesophageal reflux disease)   . History of kidney stones   . Insomnia   . Intermittent low back pain   . Migraines   . Osteoarthritis   . Recurrent UTI   . Restless leg syndrome   . Sciatica of right side   . Symptomatic menopausal or female climacteric states   . Vertigo   . Vitamin D deficiency    Family History  Problem  Relation Age of Onset  . Cancer Paternal Grandmother   . Anxiety disorder Cousin   . Depression Cousin    Past Surgical History:  Procedure Laterality Date  . BACK SURGERY    . COLONOSCOPY WITH PROPOFOL N/A 02/03/2019   Procedure: COLONOSCOPY WITH PROPOFOL;  Surgeon: Jonathon Bellows, MD;  Location: [facility] ENDOSCOPY;  Service: Gastroenterology;  Laterality: N/A;  . COLONOSCOPY WITH PROPOFOL N/A 03/10/2019   Procedure: COLONOSCOPY WITH PROPOFOL;  Surgeon: Jonathon Bellows, MD;  Location: [facility] ENDOSCOPY;  Service: Gastroenterology;  Laterality: N/A;  . DIAGNOSTIC LAPAROSCOPY    . DILATION AND CURETTAGE OF UTERUS    . KNEE ARTHROSCOPY WITH MEDIAL MENISECTOMY Left 01/14/2017   Procedure: KNEE ARTHROSCOPY WITH MEDIAL AND LATERAL MENISECTOMY;  Surgeon: Hessie Knows, MD;  Location: [facility] ORS;  Service: Orthopedics;  Laterality: Left;  Partial Knee menisectomy  . KNEE SURGERY Left 05/2011   Dr. Rudene Christians- Arthroscopic  . LUMBAR LAMINECTOMY/DECOMPRESSION MICRODISCECTOMY N/A 09/16/2016   Procedure: LUMBAR LAMINECTOMY/DECOMPRESSION MICRODISCECTOMY 1 LEVEL L5-S1;  Surgeon: Meade Maw, MD;  Location: [facility] ORS;  Service: Neurosurgery;  Laterality: N/A;  . TONSILLECTOMY AND ADENOIDECTOMY    . URETHRAL STRICTURE DILATATION      Short Social History:  Social History   Tobacco Use  . Smoking status: Former Smoker    Packs/day: 0.75  Years: 2.00    Pack years: 1.50    Types: Cigarettes    Start date: 07/06/1981    Quit date: 07/07/1983    Years since quitting: 36.9  . Smokeless tobacco: Never Used  Substance Use Topics  . Alcohol use: No    Alcohol/week: 0.0 standard drinks    Allergies  Allergen Reactions  . Penicillins Anaphylaxis, Hives, Nausea And Vomiting and Swelling    Has patient had a PCN reaction causing immediate rash, facial/tongue/throat swelling, SOB or lightheadedness with hypotension: Yes Has patient had a PCN reaction causing severe rash involving mucus membranes or skin necrosis: Yes Has  patient had a PCN reaction that required hospitalization No Has patient had a PCN reaction occurring within the last 10 years: Yes If all of the above answers are "NO", then may proceed with Cephalosporin use.   . Chlorhexidine Gluconate Itching and Rash  . Ciprofloxacin Hives  . Clindamycin/Lincomycin Hives  . Erythromycin Hives and Nausea And Vomiting  . Keflex [Cephalexin] Hives  . Lyrica [Pregabalin]     Dizziness, syncope  . Nitrofurantoin Monohyd Macro Hives and Nausea And Vomiting  . Other Hives  . Sulfa Antibiotics Hives and Nausea And Vomiting    Rapid heart rate  . Tetracyclines & Related Hives  . Adhesive [Tape] Rash  . Latex Rash  . Vancomycin Itching and Rash    Current Outpatient Medications  Medication Sig Dispense Refill  . busPIRone (BUSPAR) 7.5 MG tablet Take 1 tablet (7.5 mg total) by mouth daily. 90 tablet 0  . gabapentin (NEURONTIN) 300 MG capsule 300 mg qAM, 600 mg QHS 90 capsule 5  . loratadine (CLARITIN) 10 MG tablet Take 1 tablet (10 mg total) by mouth daily. 90 tablet 3  . mirtazapine (REMERON) 7.5 MG tablet Take 1 tablet (7.5 mg total) by mouth at bedtime. For sleep and anxiety 90 tablet 0  . omeprazole (PRILOSEC) 40 MG capsule Take 1 capsule (40 mg total) by mouth every morning. 90 capsule 1  . prazosin (MINIPRESS) 1 MG capsule Take 1 capsule (1 mg total) by mouth at bedtime. 90 capsule 0  . rOPINIRole (REQUIP) 1 MG tablet Take 1 tablet (1 mg total) by mouth at bedtime. 90 tablet 1  . SUMAtriptan (IMITREX) 100 MG tablet Take 1 tablet (100 mg total) by mouth every 2 (two) hours as needed for migraine. May repeat in 2 hours if headache persists or recurs. 10 tablet 0  . topiramate (TOPAMAX) 100 MG tablet Take 1 tablet (100 mg total) by mouth every evening. 90 tablet 1  . venlafaxine XR (EFFEXOR XR) 75 MG 24 hr capsule Take 1 capsule (75 mg total) by mouth daily with breakfast. To be combined with 150 mg daily 90 capsule 1  . venlafaxine XR (EFFEXOR-XR) 150  MG 24 hr capsule Take 1 capsule (150 mg total) by mouth daily with breakfast. To be combined with 75 mg daily 90 capsule 1  . fluticasone (FLONASE) 50 MCG/ACT nasal spray Place 2 sprays into both nostrils daily. 16 g 2  . oxyCODONE-acetaminophen (PERCOCET) 5-325 MG tablet Take 1 tablet by mouth every 4 (four) hours as needed for severe pain. 8 tablet 0   No current facility-administered medications for this visit.    Review of Systems  Constitutional: Negative for chills, fatigue, fever and unexpected weight change.  HENT: Negative for trouble swallowing.  Eyes: Negative for loss of vision.  Respiratory: Negative for cough, shortness of breath and wheezing.  Cardiovascular: Negative for chest pain,  leg swelling, palpitations and syncope.  GI: Negative for abdominal pain, blood in stool, diarrhea, nausea and vomiting.  GU: Negative for difficulty urinating, dysuria, frequency and hematuria.  Musculoskeletal: Negative for back pain, leg pain and joint pain.  Skin: Negative for rash.  Neurological: Negative for dizziness, headaches, light-headedness, numbness and seizures.  Psychiatric: Negative for behavioral problem, confusion, depressed mood and sleep disturbance.        Objective:  Objective   Vitals:   05/27/20 1535  Weight: 234 lb (106.1 kg)  Height: 5\' 4"  (1.626 m)   Body mass index is 40.17 kg/m.  Physical Exam Vitals and nursing note reviewed.  Constitutional:      Appearance: She is well-developed.  HENT:     Head: Normocephalic and atraumatic.  Eyes:     Pupils: Pupils are equal, round, and reactive to light.  Cardiovascular:     Rate and Rhythm: Normal rate and regular rhythm.  Pulmonary:     Effort: Pulmonary effort is normal. No respiratory distress.  Abdominal:     General: Abdomen is flat. There is no distension.     Palpations: Abdomen is soft.     Tenderness: There is abdominal tenderness. There is no guarding or rebound.  Genitourinary:    Comments:  External: Vulva normal. No lesions noted.  Speculum examination:  Cervix normal . 10 cc blood in the vaginal vault, constant leakage of dark blood with swipe of dossile.  Bimanual examination: Uterus tender, midline,normal in size, shape and contour. + CMT. Adnexa tender bilaterally. Pelvis is not fixed.    Skin:    General: Skin is warm and dry.  Neurological:     Mental Status: She is alert and oriented to person, place, and time.  Psychiatric:        Behavior: Behavior normal.        Thought Content: Thought content normal.        Judgment: Judgment normal.     Assessment/Plan:     54 yo with postmenopausal bleeding and pelvic pain Will obtain STAT pelvic US for bleeding and pelvic pain.  Will plan to sample endometrium by D&C. This is patient's second bleeding episode in a few months.  Previous biopsy showed a polyp in office.  History of ovarian cyst. Will check ROMA today as well.  Discussed that pelvic pain at her age was unusual as a gynecological origin and may represent a bowel concern such as diverticulitis. Encouraged to monitor for fever or worsening pain and return to the ER for further evaluation and possible repeat CT if needed. She denies trouble with bowel movements or constipation.   More than 35 minutes were spent face to face with the patient in the room, reviewing the medical record, labs and images, and coordinating care for the patient. The plan of management was discussed in detail and counseling was provided.      Adrian Prows MD [facility] 05/27/2020 4:01 PM

## 2020-05-27 NOTE — Progress Notes (Signed)
Patient ID: Kelly [name], female   DOB: Dec 17, 1965, 54 y.o.   MRN: [id number]  Reason for Consult: Gynecologic Exam (post menopausal bleeding)   Referred by Steele Sizer, MD  Subjective:     HPI:  Kelly [name] is a 54 y.o. female patient is here today with complaints of severe pain and bleeding.  She reports that this pain started in August but has become more severe recently.  She reports that she saw Dr. Georgianne Fick after a visit to the [facility] ED where she had a pelvic ultrasound and CT.  She reports that those ultrasounds and CT showed a mass.  She reports that when she followed up with Dr. Georgianne Fick she had dilation of the cervix in the office which revealed a heme endometrium.  She had a endometrial biopsy which showed a polyp without hyperplasia.  She reports that on Wednesday the patient called and requested visit.  She was worked into the schedule but she missed the appointment.  She was however seen in the ER.  She reports last Wednesday she is only having a small amount of spotting but now she has began having heavier bleeding.  She reports that she is having to change her pad every hour and the pad is generally saturated.  She reports that the pain is worse and hurts with walking or standing.  It feels like something is dropped out of her.  She reports pressure in her hips and her back.  Gynecological History Menarche: 87 Menopause: 2020 Last pap smear: 2021 ASCUS HPV negative Last Mammogram: 2015 History of STDs: None Sexually Active: Yes   Past Medical History:  Diagnosis Date  . Allergy   . Anxiety   . Depression   . GERD (gastroesophageal reflux disease)   . History of kidney stones   . Insomnia   . Intermittent low back pain   . Migraines   . Osteoarthritis   . Recurrent UTI   . Restless leg syndrome   . Sciatica of right side   . Symptomatic menopausal or female climacteric states   . Vertigo   . Vitamin D deficiency    Family History  Problem  Relation Age of Onset  . Cancer Paternal Grandmother   . Anxiety disorder Cousin   . Depression Cousin    Past Surgical History:  Procedure Laterality Date  . BACK SURGERY    . COLONOSCOPY WITH PROPOFOL N/A 02/03/2019   Procedure: COLONOSCOPY WITH PROPOFOL;  Surgeon: Jonathon Bellows, MD;  Location: [facility] ENDOSCOPY;  Service: Gastroenterology;  Laterality: N/A;  . COLONOSCOPY WITH PROPOFOL N/A 03/10/2019   Procedure: COLONOSCOPY WITH PROPOFOL;  Surgeon: Jonathon Bellows, MD;  Location: [facility] ENDOSCOPY;  Service: Gastroenterology;  Laterality: N/A;  . DIAGNOSTIC LAPAROSCOPY    . DILATION AND CURETTAGE OF UTERUS    . KNEE ARTHROSCOPY WITH MEDIAL MENISECTOMY Left 01/14/2017   Procedure: KNEE ARTHROSCOPY WITH MEDIAL AND LATERAL MENISECTOMY;  Surgeon: Hessie Knows, MD;  Location: [facility] ORS;  Service: Orthopedics;  Laterality: Left;  Partial Knee menisectomy  . KNEE SURGERY Left 05/2011   Dr. Rudene Christians- Arthroscopic  . LUMBAR LAMINECTOMY/DECOMPRESSION MICRODISCECTOMY N/A 09/16/2016   Procedure: LUMBAR LAMINECTOMY/DECOMPRESSION MICRODISCECTOMY 1 LEVEL L5-S1;  Surgeon: Meade Maw, MD;  Location: [facility] ORS;  Service: Neurosurgery;  Laterality: N/A;  . TONSILLECTOMY AND ADENOIDECTOMY    . URETHRAL STRICTURE DILATATION      Short Social History:  Social History   Tobacco Use  . Smoking status: Former Smoker    Packs/day: 0.75  Years: 2.00    Pack years: 1.50    Types: Cigarettes    Start date: 07/06/1981    Quit date: 07/07/1983    Years since quitting: 36.9  . Smokeless tobacco: Never Used  Substance Use Topics  . Alcohol use: No    Alcohol/week: 0.0 standard drinks    Allergies  Allergen Reactions  . Penicillins Anaphylaxis, Hives, Nausea And Vomiting and Swelling    Has patient had a PCN reaction causing immediate rash, facial/tongue/throat swelling, SOB or lightheadedness with hypotension: Yes Has patient had a PCN reaction causing severe rash involving mucus membranes or skin necrosis: Yes Has  patient had a PCN reaction that required hospitalization No Has patient had a PCN reaction occurring within the last 10 years: Yes If all of the above answers are "NO", then may proceed with Cephalosporin use.   . Chlorhexidine Gluconate Itching and Rash  . Ciprofloxacin Hives  . Clindamycin/Lincomycin Hives  . Erythromycin Hives and Nausea And Vomiting  . Keflex [Cephalexin] Hives  . Lyrica [Pregabalin]     Dizziness, syncope  . Nitrofurantoin Monohyd Macro Hives and Nausea And Vomiting  . Other Hives  . Sulfa Antibiotics Hives and Nausea And Vomiting    Rapid heart rate  . Tetracyclines & Related Hives  . Adhesive [Tape] Rash  . Latex Rash  . Vancomycin Itching and Rash    Current Outpatient Medications  Medication Sig Dispense Refill  . busPIRone (BUSPAR) 7.5 MG tablet Take 1 tablet (7.5 mg total) by mouth daily. 90 tablet 0  . gabapentin (NEURONTIN) 300 MG capsule 300 mg qAM, 600 mg QHS 90 capsule 5  . loratadine (CLARITIN) 10 MG tablet Take 1 tablet (10 mg total) by mouth daily. 90 tablet 3  . mirtazapine (REMERON) 7.5 MG tablet Take 1 tablet (7.5 mg total) by mouth at bedtime. For sleep and anxiety 90 tablet 0  . omeprazole (PRILOSEC) 40 MG capsule Take 1 capsule (40 mg total) by mouth every morning. 90 capsule 1  . prazosin (MINIPRESS) 1 MG capsule Take 1 capsule (1 mg total) by mouth at bedtime. 90 capsule 0  . rOPINIRole (REQUIP) 1 MG tablet Take 1 tablet (1 mg total) by mouth at bedtime. 90 tablet 1  . SUMAtriptan (IMITREX) 100 MG tablet Take 1 tablet (100 mg total) by mouth every 2 (two) hours as needed for migraine. May repeat in 2 hours if headache persists or recurs. 10 tablet 0  . topiramate (TOPAMAX) 100 MG tablet Take 1 tablet (100 mg total) by mouth every evening. 90 tablet 1  . venlafaxine XR (EFFEXOR XR) 75 MG 24 hr capsule Take 1 capsule (75 mg total) by mouth daily with breakfast. To be combined with 150 mg daily 90 capsule 1  . venlafaxine XR (EFFEXOR-XR) 150  MG 24 hr capsule Take 1 capsule (150 mg total) by mouth daily with breakfast. To be combined with 75 mg daily 90 capsule 1  . fluticasone (FLONASE) 50 MCG/ACT nasal spray Place 2 sprays into both nostrils daily. 16 g 2  . oxyCODONE-acetaminophen (PERCOCET) 5-325 MG tablet Take 1 tablet by mouth every 4 (four) hours as needed for severe pain. 8 tablet 0   No current facility-administered medications for this visit.    Review of Systems  Constitutional: Negative for chills, fatigue, fever and unexpected weight change.  HENT: Negative for trouble swallowing.  Eyes: Negative for loss of vision.  Respiratory: Negative for cough, shortness of breath and wheezing.  Cardiovascular: Negative for chest pain,  leg swelling, palpitations and syncope.  GI: Negative for abdominal pain, blood in stool, diarrhea, nausea and vomiting.  GU: Negative for difficulty urinating, dysuria, frequency and hematuria.  Musculoskeletal: Negative for back pain, leg pain and joint pain.  Skin: Negative for rash.  Neurological: Negative for dizziness, headaches, light-headedness, numbness and seizures.  Psychiatric: Negative for behavioral problem, confusion, depressed mood and sleep disturbance.        Objective:  Objective   Vitals:   05/27/20 1535  Weight: 234 lb (106.1 kg)  Height: 5\' 4"  (1.626 m)   Body mass index is 40.17 kg/m.  Physical Exam Vitals and nursing note reviewed.  Constitutional:      Appearance: She is well-developed.  HENT:     Head: Normocephalic and atraumatic.  Eyes:     Pupils: Pupils are equal, round, and reactive to light.  Cardiovascular:     Rate and Rhythm: Normal rate and regular rhythm.  Pulmonary:     Effort: Pulmonary effort is normal. No respiratory distress.  Abdominal:     General: Abdomen is flat. There is no distension.     Palpations: Abdomen is soft.     Tenderness: There is abdominal tenderness. There is no guarding or rebound.  Genitourinary:    Comments:  External: Vulva normal. No lesions noted.  Speculum examination:  Cervix normal . 10 cc blood in the vaginal vault, constant leakage of dark blood with swipe of dossile.  Bimanual examination: Uterus tender, midline,normal in size, shape and contour. + CMT. Adnexa tender bilaterally. Pelvis is not fixed.    Skin:    General: Skin is warm and dry.  Neurological:     Mental Status: She is alert and oriented to person, place, and time.  Psychiatric:        Behavior: Behavior normal.        Thought Content: Thought content normal.        Judgment: Judgment normal.     Assessment/Plan:     54 yo with postmenopausal bleeding and pelvic pain Will obtain STAT pelvic US for bleeding and pelvic pain.  Will plan to sample endometrium by D&C. This is patient's second bleeding episode in a few months.  Previous biopsy showed a polyp in office.  History of ovarian cyst. Will check ROMA today as well.  Discussed that pelvic pain at her age was unusual as a gynecological origin and may represent a bowel concern such as diverticulitis. Encouraged to monitor for fever or worsening pain and return to the ER for further evaluation and possible repeat CT if needed. She denies trouble with bowel movements or constipation.   More than 35 minutes were spent face to face with the patient in the room, reviewing the medical record, labs and images, and coordinating care for the patient. The plan of management was discussed in detail and counseling was provided.      Adrian Prows MD [facility] 05/27/2020 4:01 PM

## 2020-05-27 NOTE — Progress Notes (Signed)
Pt states she has post menopausal bleeding this started last Wednesday. Its been worst the last 2 days.

## 2020-05-28 ENCOUNTER — Telehealth: Payer: Self-pay | Admitting: Obstetrics and Gynecology

## 2020-05-28 ENCOUNTER — Other Ambulatory Visit: Payer: Self-pay | Admitting: Obstetrics and Gynecology

## 2020-05-28 ENCOUNTER — Other Ambulatory Visit: Payer: BC Managed Care – PPO

## 2020-05-28 ENCOUNTER — Ambulatory Visit
Admission: RE | Admit: 2020-05-28 | Discharge: 2020-05-28 | Disposition: A | Discharge status: Home / Self Care | Payer: BC Managed Care – PPO | Source: Ambulatory Visit | Attending: Obstetrics and Gynecology | Admitting: Obstetrics and Gynecology

## 2020-05-28 ENCOUNTER — Other Ambulatory Visit
Admission: RE | Admit: 2020-05-28 | Discharge: 2020-05-28 | Disposition: A | Discharge status: Home / Self Care | Payer: BC Managed Care – PPO | Source: Ambulatory Visit | Attending: Obstetrics and Gynecology | Admitting: Obstetrics and Gynecology

## 2020-05-28 DIAGNOSIS — N83209 Unspecified ovarian cyst, unspecified side: Secondary | ICD-10-CM | POA: Diagnosis not present

## 2020-05-28 DIAGNOSIS — R102 Pelvic and perineal pain: Secondary | ICD-10-CM | POA: Diagnosis not present

## 2020-05-28 DIAGNOSIS — N950 Postmenopausal bleeding: Secondary | ICD-10-CM

## 2020-05-28 DIAGNOSIS — N83202 Unspecified ovarian cyst, left side: Secondary | ICD-10-CM | POA: Diagnosis not present

## 2020-05-28 DIAGNOSIS — Z20822 Contact with and (suspected) exposure to covid-19: Secondary | ICD-10-CM | POA: Insufficient documentation

## 2020-05-28 NOTE — Telephone Encounter (Signed)
Called patient to schedule Hysteroscopy D&C w Gilman Schmidt  DOS 11/24 @ 12:00pm - adv pt to arr at [place] at 10:00am  H&P N/A    Covid testing was done today, 11/23  Adv pt NPO 12am  Confirmed pt has [employer] as Chartered certified accountant. No secondary insurance.

## 2020-05-28 NOTE — Telephone Encounter (Signed)
-----   Message from Homero Fellers, MD sent at 05/28/2020 10:22 AM EST ----- Surgery Booking Request Patient Full Name:  Kelly [name]  MRN: [id number]  DOB: May 29, 1966  Surgeon: Homero Fellers, MD  Requested Surgery Date and Time: 05/29/2020 Primary Diagnosis AND Code: postmenopausal bleeding Secondary Diagnosis and Code:  Surgical Procedure: Hysteroscopy D&C RNFA Requested?: No L&D Notification: No Admission Status: same day surgery Length of Surgery: 50 min Special Case Needs: No H&P: No Phone Interview???:  Yes Interpreter: No Medical Clearance:  No Special Scheduling Instructions: No Any known health/anesthesia issues, diabetes, sleep apnea, latex allergy, defibrillator/pacemaker?: No Acuity: P3   (P1 highest, P2 delay may cause harm, P3 low, elective gyn, P4 lowest)

## 2020-05-29 ENCOUNTER — Encounter
Admission: RE | Disposition: A | Discharge status: Home / Self Care | Payer: Self-pay | Source: Home / Self Care | Attending: Obstetrics and Gynecology

## 2020-05-29 ENCOUNTER — Ambulatory Visit: Payer: BC Managed Care – PPO | Admitting: Anesthesiology

## 2020-05-29 ENCOUNTER — Other Ambulatory Visit: Payer: Self-pay

## 2020-05-29 ENCOUNTER — Encounter: Payer: Self-pay | Admitting: Obstetrics and Gynecology

## 2020-05-29 ENCOUNTER — Ambulatory Visit
Admission: RE | Admit: 2020-05-29 | Discharge: 2020-05-29 | Disposition: A | Discharge status: Home / Self Care | Payer: BC Managed Care – PPO | Attending: Obstetrics and Gynecology | Admitting: Obstetrics and Gynecology

## 2020-05-29 DIAGNOSIS — K219 Gastro-esophageal reflux disease without esophagitis: Secondary | ICD-10-CM | POA: Diagnosis not present

## 2020-05-29 DIAGNOSIS — N950 Postmenopausal bleeding: Secondary | ICD-10-CM | POA: Diagnosis not present

## 2020-05-29 DIAGNOSIS — Z87891 Personal history of nicotine dependence: Secondary | ICD-10-CM | POA: Diagnosis not present

## 2020-05-29 DIAGNOSIS — R102 Pelvic and perineal pain: Secondary | ICD-10-CM | POA: Insufficient documentation

## 2020-05-29 DIAGNOSIS — Z79899 Other long term (current) drug therapy: Secondary | ICD-10-CM | POA: Diagnosis not present

## 2020-05-29 DIAGNOSIS — Z6830 Body mass index (BMI) 30.0-30.9, adult: Secondary | ICD-10-CM | POA: Diagnosis not present

## 2020-05-29 HISTORY — PX: HYSTEROSCOPY WITH D & C: SHX1775

## 2020-05-29 LAB — CBC
HCT: 42.4 % (ref 36.0–46.0)
Hemoglobin: 13.8 g/dL (ref 12.0–15.0)
MCH: 29.4 pg (ref 26.0–34.0)
MCHC: 32.5 g/dL (ref 30.0–36.0)
MCV: 90.2 fL (ref 80.0–100.0)
Platelets: 335 10*3/uL (ref 150–400)
RBC: 4.7 MIL/uL (ref 3.87–5.11)
RDW: 13.2 % (ref 11.5–15.5)
WBC: 10.6 10*3/uL — ABNORMAL HIGH (ref 4.0–10.5)
nRBC: 0 % (ref 0.0–0.2)

## 2020-05-29 LAB — POSTMENOPAUSAL INTERP: LOW

## 2020-05-29 LAB — OVARIAN MALIGNANCY RISK-ROMA
Cancer Antigen (CA) 125: 28.8 U/mL (ref 0.0–38.1)
HE4: 83 pmol/L (ref 0.0–105.2)
Postmenopausal ROMA: 2.62
Premenopausal ROMA: 2.19 — ABNORMAL HIGH

## 2020-05-29 LAB — TYPE AND SCREEN
ABO/RH(D): A POS
Antibody Screen: NEGATIVE

## 2020-05-29 LAB — POCT PREGNANCY, URINE
Preg Test, Ur: NEGATIVE
Preg Test, Ur: NEGATIVE

## 2020-05-29 LAB — SARS CORONAVIRUS 2 (TAT 6-24 HRS): SARS Coronavirus 2: NEGATIVE

## 2020-05-29 LAB — PREMENOPAUSAL INTERP: HIGH

## 2020-05-29 SURGERY — DILATATION AND CURETTAGE /HYSTEROSCOPY
Anesthesia: General

## 2020-05-29 MED ORDER — CHLORHEXIDINE GLUCONATE 0.12 % MT SOLN
OROMUCOSAL | Status: AC
  Filled 2020-05-29: qty 15

## 2020-05-29 MED ORDER — SUGAMMADEX SODIUM 500 MG/5ML IV SOLN
INTRAVENOUS | Status: DC | PRN
  Administered 2020-05-29: 500 mg via INTRAVENOUS

## 2020-05-29 MED ORDER — ONDANSETRON HCL 4 MG/2ML IJ SOLN
INTRAMUSCULAR | Status: DC | PRN
  Administered 2020-05-29 (×2): 4 mg via INTRAVENOUS

## 2020-05-29 MED ORDER — LIDOCAINE HCL (CARDIAC) PF 100 MG/5ML IV SOSY
PREFILLED_SYRINGE | INTRAVENOUS | Status: DC | PRN
  Administered 2020-05-29: 100 mg via INTRAVENOUS

## 2020-05-29 MED ORDER — SUCCINYLCHOLINE CHLORIDE 20 MG/ML IJ SOLN
INTRAMUSCULAR | Status: DC | PRN
  Administered 2020-05-29: 120 mg via INTRAVENOUS

## 2020-05-29 MED ORDER — DEXMEDETOMIDINE (PRECEDEX) IN NS 20 MCG/5ML (4 MCG/ML) IV SYRINGE
PREFILLED_SYRINGE | INTRAVENOUS | Status: DC | PRN
  Administered 2020-05-29: 8 ug via INTRAVENOUS

## 2020-05-29 MED ORDER — LACTATED RINGERS IV SOLN: INTRAVENOUS | Status: DC | PRN

## 2020-05-29 MED ORDER — FAMOTIDINE 20 MG PO TABS: 20.0000 mg | ORAL_TABLET | Freq: Once | ORAL | Status: AC

## 2020-05-29 MED ORDER — LACTATED RINGERS IV SOLN: INTRAVENOUS | Status: DC

## 2020-05-29 MED ORDER — FENTANYL CITRATE (PF) 100 MCG/2ML IJ SOLN
INTRAMUSCULAR | Status: AC
  Filled 2020-05-29: qty 2

## 2020-05-29 MED ORDER — GLYCOPYRROLATE 0.2 MG/ML IJ SOLN
INTRAMUSCULAR | Status: DC | PRN
  Administered 2020-05-29: .2 mg via INTRAVENOUS

## 2020-05-29 MED ORDER — DEXAMETHASONE SODIUM PHOSPHATE 10 MG/ML IJ SOLN
INTRAMUSCULAR | Status: DC | PRN
  Administered 2020-05-29: 10 mg via INTRAVENOUS

## 2020-05-29 MED ORDER — IBUPROFEN 600 MG PO TABS: 600.0000 mg | ORAL_TABLET | Freq: Four times a day (QID) | ORAL | 0 refills | Status: DC | PRN

## 2020-05-29 MED ORDER — ROCURONIUM BROMIDE 100 MG/10ML IV SOLN
INTRAVENOUS | Status: DC | PRN
  Administered 2020-05-29: 40 mg via INTRAVENOUS

## 2020-05-29 MED ORDER — MIDAZOLAM HCL 2 MG/2ML IJ SOLN
INTRAMUSCULAR | Status: AC
  Filled 2020-05-29: qty 2

## 2020-05-29 MED ORDER — ACETAMINOPHEN 500 MG PO TABS: 500.0000 mg | ORAL_TABLET | Freq: Four times a day (QID) | ORAL | 0 refills | Status: DC | PRN

## 2020-05-29 MED ORDER — POVIDONE-IODINE 10 % EX SWAB: 2.0000 "application " | Freq: Once | CUTANEOUS | Status: DC

## 2020-05-29 MED ORDER — FAMOTIDINE 20 MG PO TABS
ORAL_TABLET | ORAL | Status: AC
  Administered 2020-05-29: 20 mg via ORAL
  Filled 2020-05-29: qty 1

## 2020-05-29 MED ORDER — MIDAZOLAM HCL 2 MG/2ML IJ SOLN
INTRAMUSCULAR | Status: DC | PRN
  Administered 2020-05-29: 2 mg via INTRAVENOUS

## 2020-05-29 MED ORDER — FENTANYL CITRATE (PF) 100 MCG/2ML IJ SOLN
INTRAMUSCULAR | Status: DC | PRN
  Administered 2020-05-29: 100 ug via INTRAVENOUS

## 2020-05-29 MED ORDER — LACTATED RINGERS IV SOLN: 1000.0000 mL | Freq: Once | INTRAVENOUS | Status: DC

## 2020-05-29 MED ORDER — PROPOFOL 10 MG/ML IV BOLUS
INTRAVENOUS | Status: DC | PRN
  Administered 2020-05-29: 150 mg via INTRAVENOUS

## 2020-05-29 SURGICAL SUPPLY — 22 items
CATH FOLEY 2WAY  5CC 16FR (CATHETERS) ×1
CATH ROBINSON RED A/P 16FR (CATHETERS) ×2 IMPLANT
CATH URTH 16FR FL 2W BLN LF (CATHETERS) ×1 IMPLANT
DEVICE MYOSURE LITE (MISCELLANEOUS) ×2 IMPLANT
DEVICE MYOSURE REACH (MISCELLANEOUS) IMPLANT
ELECT REM PT RETURN 9FT ADLT (ELECTROSURGICAL)
ELECTRODE REM PT RTRN 9FT ADLT (ELECTROSURGICAL) IMPLANT
GAUZE 4X4 16PLY RFD (DISPOSABLE) ×2 IMPLANT
GLOVE BIOGEL PI IND STRL 6.5 (GLOVE) ×2 IMPLANT
GLOVE BIOGEL PI INDICATOR 6.5 (GLOVE) ×2
GLOVE SURG SYN 6.5 ES PF (GLOVE) ×2 IMPLANT
GOWN STRL REUS W/ TWL LRG LVL3 (GOWN DISPOSABLE) ×2 IMPLANT
GOWN STRL REUS W/TWL LRG LVL3 (GOWN DISPOSABLE) ×2
KIT PROCEDURE FLUENT (KITS) IMPLANT
MANIFOLD NEPTUNE II (INSTRUMENTS) ×2 IMPLANT
PACK DNC HYST (MISCELLANEOUS) ×2 IMPLANT
PAD OB MATERNITY 4.3X12.25 (PERSONAL CARE ITEMS) ×2 IMPLANT
PAD PREP 24X41 OB/GYN DISP (PERSONAL CARE ITEMS) ×2 IMPLANT
SEAL ROD LENS SCOPE MYOSURE (ABLATOR) ×2 IMPLANT
SOL .9 NS 3000ML IRR  AL (IV SOLUTION) ×1
SOL .9 NS 3000ML IRR UROMATIC (IV SOLUTION) ×1 IMPLANT
TOWEL OR 17X26 4PK STRL BLUE (TOWEL DISPOSABLE) ×2 IMPLANT

## 2020-05-29 NOTE — Interval H&P Note (Signed)
History and Physical Interval Note:  05/29/2020 11:52 AM  Kelly [name]  has presented today for surgery, with the diagnosis of postmenopausal bleeding.  The various methods of treatment have been discussed with the patient and family. After consideration of risks, benefits and other options for treatment, the patient has consented to  Procedure(s): DILATATION AND CURETTAGE /HYSTEROSCOPY (N/A) as a surgical intervention.  The patient's history has been reviewed, patient examined, no change in status, stable for surgery.  I have reviewed the patient's chart and labs.  Questions were answered to the patient's satisfaction.     [place]

## 2020-05-29 NOTE — Anesthesia Preprocedure Evaluation (Signed)
Anesthesia Evaluation  Patient identified by MRN, date of birth, ID band Patient awake    Reviewed: Allergy & Precautions, H&P , NPO status , Patient's Chart, lab work & pertinent test results  History of Anesthesia Complications Negative for: history of anesthetic complications  Airway Mallampati: III  TM Distance: >3 FB Neck ROM: Full    Dental no notable dental hx. (+) Teeth Intact   Pulmonary neg shortness of breath, neg COPD, Patient abstained from smoking.Not current smoker, former smoker,    Pulmonary exam normal breath sounds clear to auscultation       Cardiovascular (-) angina(-) Past MI, (-) Cardiac Stents and (-) CABG negative cardio ROS   Rhythm:Regular Rate:Normal - Systolic murmurs    Neuro/Psych  Headaches, PSYCHIATRIC DISORDERS Anxiety Depression sciatica    GI/Hepatic Neg liver ROS, GERD  Medicated,Patient ran out of omeprazole 1 week ago, has been feeling more GERD symptoms   Endo/Other  Morbid obesity  Renal/GU negative Renal ROS  negative genitourinary   Musculoskeletal  (+) Arthritis ,   Abdominal (+) + obese,   Peds  Hematology negative hematology ROS (+)   Anesthesia Other Findings Obese  Past Medical History: No date: Allergy No date: Anxiety No date: Depression No date: GERD (gastroesophageal reflux disease) No date: History of kidney stones No date: Insomnia No date: Intermittent low back pain No date: Migraines No date: Osteoarthritis No date: Recurrent UTI No date: Restless leg syndrome No date: Sciatica of right side No date: Symptomatic menopausal or female climacteric states No date: Vertigo No date: Vitamin D deficiency  Past Surgical History: No date: BACK SURGERY 02/03/2019: COLONOSCOPY WITH PROPOFOL; N/A     Comment:  Procedure: COLONOSCOPY WITH PROPOFOL;  Surgeon: Jonathon Bellows, MD;  Location: [facility] ENDOSCOPY;  Service:               Gastroenterology;   Laterality: N/A; No date: DIAGNOSTIC LAPAROSCOPY No date: DILATION AND CURETTAGE OF UTERUS 01/14/2017: KNEE ARTHROSCOPY WITH MEDIAL MENISECTOMY; Left     Comment:  Procedure: KNEE ARTHROSCOPY WITH MEDIAL AND LATERAL               MENISECTOMY;  Surgeon: Hessie Knows, MD;  Location: [facility]              ORS;  Service: Orthopedics;  Laterality: Left;  Partial               Knee menisectomy 05/2011: KNEE SURGERY; Left     Comment:  Dr. Rudene Christians- Arthroscopic 09/16/2016: LUMBAR LAMINECTOMY/DECOMPRESSION MICRODISCECTOMY; N/A     Comment:  Procedure: LUMBAR LAMINECTOMY/DECOMPRESSION               MICRODISCECTOMY 1 LEVEL L5-S1;  Surgeon: Meade Maw, MD;  Location: [facility] ORS;  Service:               Neurosurgery;  Laterality: N/A; No date: TONSILLECTOMY AND ADENOIDECTOMY No date: URETHRAL STRICTURE DILATATION  BMI    Body Mass Index: 38.23 kg/m      Reproductive/Obstetrics negative OB ROS                             Anesthesia Physical  Anesthesia Plan  ASA: III  Anesthesia Plan: General   Post-op Pain Management:    Induction: Intravenous  PONV Risk Score  and Plan: 4 or greater and Ondansetron, Dexamethasone and Midazolam  Airway Management Planned: Oral ETT  Additional Equipment: None  Intra-op Plan:   Post-operative Plan: Extubation in OR  Informed Consent: I have reviewed the patients History and Physical, chart, labs and discussed the procedure including the risks, benefits and alternatives for the proposed anesthesia with the patient or authorized representative who has indicated his/her understanding and acceptance.     Dental Advisory Given  Plan Discussed with: Anesthesiologist and CRNA  Anesthesia Plan Comments: (Discussed risks of anesthesia with patient, including PONV, sore throat, lip/dental damage. Rare risks discussed as well, such as cardiorespiratory and neurological sequelae. Patient understands.)         Anesthesia Quick Evaluation

## 2020-05-29 NOTE — Op Note (Signed)
Operative Note  05/29/2020  PRE-OP DIAGNOSIS: postmenopausal bleeding  POST-OP DIAGNOSIS: same   SURGEON: Cordney Barstow MD  PROCEDURE: Procedure(s): DILATATION AND CURETTAGE /HYSTEROSCOPY   ANESTHESIA: Choice   ESTIMATED BLOOD LOSS: 10 cc   SPECIMENS:  Endometrial curettings  FLUID DEFICIT:minimal  COMPLICATIONS: none  DISPOSITION: PACU - hemodynamically stable.  CONDITION: stable  FINDINGS: Exam under anesthesia revealed 10 cm uterus with bilateral adnexa masses or fullness. Hysteroscopy revealed normal uterine cavity with bilateral tubal ostia and normal appearing endocervical canal. Small endometrial lesion along the anterior uterine wall. Directly sampled with the myosure.  PROCEDURE IN DETAIL: After informed consent was obtained, the patient was taken to the operating room where anesthesia was obtained without difficulty. The patient was positioned in the dorsal lithotomy position in [place]. The patient's bladder was catheterized with an in and out foley catheter. The patient was examined under anesthesia, with the above noted findings. The weightedspeculum was placed inside the patient's vagina, and the the anterior lip of the cervix was seen and grasped with the tenaculum.  The uterine cavity was sounded to 8cm, and then the cervix was progressively dilated to a 15French-Pratt dilator. The 0 degree hysteroscope was introduced, with saline fluid used to distend the intrauterine cavity, with the above noted findings.  The Myosure was used to directly sample the lesion on the anterior uterine wall. Once the cavity was sampled entirely the hysteroscope was removed.   The uterine cavity was curetted until a gritty texture was noted, yielding endometrial curettings. Excellent hemostasis was noted, and all instruments were removed, with excellent hemostasis noted throughout. She was then taken out of dorsal lithotomy. Minimal discrepancy in fluid was noted.  The patient  tolerated the procedure well. Sponge, lap and needle counts were correct x2. The patient was taken to recovery room in excellent condition.  Adrian Prows MD [facility] 05/29/2020 1:09 PM

## 2020-05-29 NOTE — Discharge Instructions (Addendum)
AMBULATORY SURGERY  DISCHARGE INSTRUCTIONS   1) The drugs that you were given will stay in your system until tomorrow so for the next 24 hours you should not:  A) Drive an automobile B) Make any legal decisions C) Drink any alcoholic beverage   2) You may resume regular meals tomorrow.  Today it is better to start with liquids and gradually work up to solid foods.  You may eat anything you prefer, but it is better to start with liquids, then soup and crackers, and gradually work up to solid foods.   3) Please notify your doctor immediately if you have any unusual bleeding, trouble breathing, redness and pain at the surgery site, drainage, fever, or pain not relieved by medication. 4)   5) Your post-operative visit is on 06/06/2020                        6) Additional Instructions:     Dilation and Curettage or Vacuum Curettage, Care After These instructions give you information about caring for yourself after your procedure. Your doctor may also give you more specific instructions. Call your doctor if you have any problems or questions after your procedure. Follow these instructions at home: Activity  Do not drive or use heavy machinery while taking prescription pain medicine.  For 24 hours after your procedure, avoid driving.  Take short walks often, followed by rest periods. Ask your doctor what activities are safe for you. After one or two days, you may be able to return to your normal activities.  Do not lift anything that is heavier than 10 lb (4.5 kg) until your doctor approves.  For at least 2 weeks, or as long as told by your doctor: ? Do not douche. ? Do not use tampons. ? Do not have sex. General instructions   Take over-the-counter and prescription medicines only as told by your doctor. This is very important if you take blood thinning medicine.  Do not take baths, swim, or use a hot tub until your doctor approves. Take showers instead of baths.  Wear  compression stockings as told by your doctor.  It is up to you to get the results of your procedure. Ask your doctor when your results will be ready.  Keep all follow-up visits as told by your doctor. This is important. Contact a doctor if:  You have very bad cramps that get worse or do not get better with medicine.  You have very bad pain in your belly (abdomen).  You cannot drink fluids without throwing up (vomiting).  You get pain in a different part of the area between your belly and thighs (pelvis).  You have bad-smelling discharge from your vagina.  You have a rash. Get help right away if:  You are bleeding a lot from your vagina. A lot of bleeding means soaking more than one sanitary pad in an hour, for 2 hours in a row.  You have clumps of blood (blood clots) coming from your vagina.  You have a fever or chills.  Your belly feels very tender or hard.  You have chest pain.  You have trouble breathing.  You cough up blood.  You feel dizzy.  You feel light-headed.  You pass out (faint).  You have pain in your neck or shoulder area. Summary  Take short walks often, followed by rest periods. Ask your doctor what activities are safe for you. After one or two days, you may be  able to return to your normal activities.  Do not lift anything that is heavier than 10 lb (4.5 kg) until your doctor approves.  Do not take baths, swim, or use a hot tub until your doctor approves. Take showers instead of baths.  Contact your doctor if you have any symptoms of infection, like bad-smelling discharge from your vagina. This information is not intended to replace advice given to you by your health care provider. Make sure you discuss any questions you have with your health care provider. Document Revised: 06/04/2017 Document Reviewed: 03/09/2016 Elsevier Patient Education  2020 [employer].

## 2020-05-29 NOTE — Transfer of Care (Signed)
Immediate Anesthesia Transfer of Care Note  Patient: [name]  Procedure(s) Performed: DILATATION AND CURETTAGE /HYSTEROSCOPY (N/A )  Patient Location: PACU  Anesthesia Type:General  Level of Consciousness: awake, drowsy and patient cooperative  Airway & Oxygen Therapy: Patient Spontanous Breathing and Patient connected to face mask oxygen  Post-op Assessment: Report given to RN and Post -op Vital signs reviewed and stable  Post vital signs: Reviewed and stable  Last Vitals:  Vitals Value Taken Time  BP 125/52 05/29/20 1305  Temp 36.7 C 05/29/20 1305  Pulse 93 05/29/20 1308  Resp 16 05/29/20 1308  SpO2 90 % 05/29/20 1308  Vitals shown include unvalidated device data.  Last Pain:  Vitals:   05/29/20 0937  TempSrc: Temporal  PainSc: 9       Patients Stated Pain Goal: 3 (35/[id number])  Complications: No complications documented.

## 2020-05-29 NOTE — Anesthesia Procedure Notes (Addendum)
Procedure Name: General with mask airway Performed by: Kelton Pillar, CRNA Pre-anesthesia Checklist: Patient identified, Emergency Drugs available, Suction available and Patient being monitored Patient Re-evaluated:Patient Re-evaluated prior to induction Oxygen Delivery Method: Circle system utilized Preoxygenation: Pre-oxygenation with 100% oxygen Induction Type: IV induction Ventilation: Mask ventilation without difficulty Laryngoscope Size: McGraph and 3 Grade View: Grade I Tube type: Oral Tube size: 6.5 mm Number of attempts: 1 Airway Equipment and Method: Stylet and Oral airway Placement Confirmation: ETT inserted through vocal cords under direct vision,  positive ETCO2,  breath sounds checked- equal and bilateral and CO2 detector Secured at: 21 cm Tube secured with: Tape Dental Injury: Teeth and Oropharynx as per pre-operative assessment

## 2020-05-31 LAB — SURGICAL PATHOLOGY

## 2020-06-03 NOTE — Anesthesia Postprocedure Evaluation (Signed)
Anesthesia Post Note  Patient: Kelly [name]  Procedure(s) Performed: DILATATION AND CURETTAGE /HYSTEROSCOPY (N/A )  Patient location during evaluation: PACU Anesthesia Type: General Level of consciousness: awake and alert Pain management: pain level controlled Vital Signs Assessment: post-procedure vital signs reviewed and stable Respiratory status: spontaneous breathing, nonlabored ventilation, respiratory function stable and patient connected to nasal cannula oxygen Cardiovascular status: blood pressure returned to baseline and stable Postop Assessment: no apparent nausea or vomiting Anesthetic complications: no   No complications documented.   Last Vitals:  Vitals:   05/29/20 1350 05/29/20 1409  BP: 118/67 122/77  Pulse: 94   Resp: 12 16  Temp: 36.7 C 36.6 C  SpO2: 99% 97%    Last Pain:  Vitals:   05/29/20 1409  TempSrc: Tympanic  PainSc: 0-No pain                 Molli Barrows

## 2020-06-06 ENCOUNTER — Ambulatory Visit (INDEPENDENT_AMBULATORY_CARE_PROVIDER_SITE_OTHER): Payer: BC Managed Care – PPO | Admitting: Obstetrics and Gynecology

## 2020-06-06 ENCOUNTER — Other Ambulatory Visit: Payer: Self-pay

## 2020-06-06 ENCOUNTER — Encounter: Payer: Self-pay | Admitting: Obstetrics and Gynecology

## 2020-06-06 ENCOUNTER — Ambulatory Visit: Payer: BC Managed Care – PPO | Admitting: Obstetrics and Gynecology

## 2020-06-06 VITALS — BP 128/72 | Ht 64.0 in | Wt 232.6 lb

## 2020-06-06 DIAGNOSIS — R102 Pelvic and perineal pain: Secondary | ICD-10-CM

## 2020-06-06 DIAGNOSIS — N950 Postmenopausal bleeding: Secondary | ICD-10-CM

## 2020-06-06 NOTE — Progress Notes (Signed)
POST OP Follow up [facility]

## 2020-06-06 NOTE — Progress Notes (Signed)
Postoperative Follow-up Patient presents post op from diagnostic hysteroscopy for postmenopausal bleedin, 1 week ago.  Subjective: Patient reports marked improvement in her preop symptoms. Eating a regular diet without difficulty. Pain is controlled without any medications.  Activity: normal activities of daily living. Patient reports additional symptom's since surgery of None.  Objective: BP 128/72    Ht 5\' 4"  (1.626 m)    Wt 232 lb 9.6 oz (105.5 kg)    BMI 39.93 kg/m  Physical Exam Constitutional:      Appearance: She is well-developed.  Genitourinary:     Vagina and uterus normal.     No lesions in the vagina.     No cervical motion tenderness.     No right or left adnexal mass present.  HENT:     Head: Normocephalic and atraumatic.  Neck:     Thyroid: No thyromegaly.  Cardiovascular:     Rate and Rhythm: Normal rate and regular rhythm.     Heart sounds: Normal heart sounds.  Pulmonary:     Effort: Pulmonary effort is normal.     Breath sounds: Normal breath sounds.  Chest:     Breasts:        Right: No inverted nipple, mass, nipple discharge or skin change.        Left: No inverted nipple, mass, nipple discharge or skin change.  Abdominal:     General: Bowel sounds are normal. There is no distension.     Palpations: Abdomen is soft. There is no mass.  Musculoskeletal:     Cervical back: Neck supple.  Neurological:     Mental Status: She is alert and oriented to person, place, and time.  Skin:    General: Skin is warm and dry.  Psychiatric:        Behavior: Behavior normal.        Thought Content: Thought content normal.        Judgment: Judgment normal.  Vitals reviewed.    Assessment: s/p :  diagnostic hysteroscopy stable  Plan: Patient has done well after surgery with no apparent complications.  I have discussed the post-operative course to date, and the expected progress moving forward.  The patient understands what complications to be concerned about.  I  will see the patient in routine follow up, or sooner if needed.    Kelly [name] is postmenopausal however she did have proliferative endometrium on her hysteroscopy pathology.  Discussed that in the setting use of norethindrone or an IUD would be reasonable to help prevent progression to any type of endometrial cancer or hyperplasia.  Would also recommend rebiopsying in the setting of any repeat postmenopausal bleeding.  Discussed patient's left ovarian cyst.  Left ovarian cyst is very simple in appearance and tumor markers were negative.  Recommended repeat monitoring of the cyst in 3 months versus removal.  Patient reports that for several years she has been having increased amounts of pelvic pain pressure and back pain.  She is concerned that this pain is from her uterus.  She has a family history of endometriosis and several family members that have had cancer in the past especially on her dad's side.  She feels like given the bleeding and the platelet of endometrium and the pain that she experiences that she is most interested in hysterectomy at this time.  Discussed the pros and cons of surgery with the patient as well as risk of possible complications and the recovery time which can last for 4 to 12 weeks.  Discussed risk of infection risk of bleeding risk of damage to surrounding pelvic organs.  Also discussed the pros and cons of leaving the surgery.  Given the patient's age and the appearance of the simple left ovarian cyst would recommend against removal of ovaries at the time of surgery given that they can contribute to increased long-term mortality however the patient has concerns related related to the cyst that she could and given that she is postmenopausal she could consider ovarian removal at the time of her hysterectomy.  Patient desired to move forward with hysterectomy with bilateral salpingectomy we will plan for surgery in the coming months.  Note sent to surgical scheduler's the patient can  schedule at her convenience.  Would recommend laparoscopic or robotic hysterectomy for the patient.  Activity plan: No restriction. Pelvic rest.  Kelly [name] 06/06/2020, 10:34 AM

## 2020-06-06 NOTE — Patient Instructions (Signed)
Total Laparoscopic Hysterectomy A total laparoscopic hysterectomy is a minimally invasive surgery to remove the uterus and cervix. The fallopian tubes and ovaries can also be removed (bilateral salpingo-oophorectomy) during this surgery, if necessary. This procedure may be done to treat problems such as:  Noncancerous growths in the uterus (uterine fibroids) that cause symptoms.  A condition that causes the lining of the uterus (endometrium) to grow in other areas (endometriosis).  Problems with pelvic support. This is caused by weakened muscles of the pelvis following vaginal childbirth or menopause.  Cancer of the cervix, ovaries, uterus, or endometrium.  Excessive (dysfunctional) uterine bleeding. This surgery is performed by inserting a thin, lighted tube (laparoscope) and surgical instruments into small incisions in the abdomen. The laparoscope sends images to a monitor. The images help the health care provider perform the procedure. After this procedure, you will no longer be able to have a baby, and you will no longer have a menstrual period. Tell a health care provider about:  Any allergies you have.  All medicines you are taking, including vitamins, herbs, eye drops, creams, and over-the-counter medicines.  Any problems you or family members have had with anesthetic medicines.  Any blood disorders you have.  Any surgeries you have had.  Any medical conditions you have.  Whether you are pregnant or may be pregnant. What are the risks? Generally, this is a safe procedure. However, problems may occur, including:  Infection.  Bleeding.  Blood clots in the legs or lungs.  Allergic reactions to medicines.  Damage to other structures or organs.  The risk that the surgery may have to be switched to the regular one in which a large incision is made in the abdomen (abdominal hysterectomy). What happens before the procedure? Staying hydrated Follow instructions from your  health care provider about hydration, which may include:  Up to 2 hours before the procedure - you may continue to drink clear liquids, such as water, clear fruit juice, black coffee, and plain tea Eating and drinking restrictions Follow instructions from your health care provider about eating and drinking, which may include:  8 hours before the procedure - stop eating heavy meals or foods such as meat, fried foods, or fatty foods.  6 hours before the procedure - stop eating light meals or foods, such as toast or cereal.  6 hours before the procedure - stop drinking milk or drinks that contain milk.  2 hours before the procedure - stop drinking clear liquids. Medicines  Ask your health care provider about: ? Changing or stopping your regular medicines. This is especially important if you are taking diabetes medicines or blood thinners. ? Taking over-the-counter medicines, vitamins, herbs, and supplements. ? Taking medicines such as aspirin and ibuprofen. These medicines can thin your blood. Do not take these medicines unless your health care provider tells you to take them.  You may be given antibiotic medicine to help prevent infection.  You may be asked to take laxatives.  You may be given medicines to help prevent nausea and vomiting after the procedure. General instructions  Ask your health care provider how your surgical site will be marked or identified.  You may be asked to shower with a germ-killing soap.  Do not use any products that contain nicotine or tobacco, such as cigarettes and e-cigarettes. If you need help quitting, ask your health care provider.  You may have an exam or testing, such as an ultrasound to determine the size and shape of your pelvic organs.    You may have a blood or urine sample taken.  This procedure can affect the way you feel about yourself. Talk with your health care provider about the physical and emotional changes hysterectomy may  cause.  Plan to have someone take you home from the hospital or clinic.  Plan to have a responsible adult care for you for at least 24 hours after you leave the hospital or clinic. This is important. What happens during the procedure?  To lower your risk of infection: ? Your health care team will wash or sanitize their hands. ? Your skin will be washed with soap. ? Hair may be removed from the surgical area.  An IV will be inserted into one of your veins.  You will be given one or more of the following: ? A medicine to help you relax (sedative). ? A medicine to make you fall asleep (general anesthetic).  You will be given antibiotic medicine through your IV.  A tube may be inserted down your throat to help you breathe during the procedure.  A gas (carbon dioxide) will be used to inflate your abdomen to allow your surgeon to see inside of your abdomen.  Three or four small incisions will be made in your abdomen.  A laparoscope will be inserted into one of your incisions. Surgical instruments will be inserted through the other incisions in order to perform the procedure.  Your uterus and cervix may be removed through your vagina or cut into small pieces and removed through the small incisions. Any other organs that need to be removed will also be removed this way.  Carbon dioxide will be released from inside of your abdomen.  Your incisions will be closed with stitches (sutures).  A bandage (dressing) may be placed over your incisions. The procedure may vary among health care providers and hospitals. What happens after the procedure?  Your blood pressure, heart rate, breathing rate, and blood oxygen level will be monitored until the medicines you were given have worn off.  You will be given medicine for pain and nausea as needed.  Do not drive for 24 hours if you received a sedative. Summary  Total Laparoscopic hysterectomy is a procedure to remove your uterus, cervix and  sometimes the fallopian tubes and ovaries.  This procedure can affect the way you feel about yourself. Talk with your health care provider about the physical and emotional changes hysterectomy may cause.  After this procedure, you will no longer be able to have a baby, and you will no longer have a menstrual period.  You will be given pain medicine to control discomfort after this procedure. This information is not intended to replace advice given to you by your health care provider. Make sure you discuss any questions you have with your health care provider. Document Revised: 06/04/2017 Document Reviewed: 09/02/2016 Elsevier Patient Education  2020 [employer].

## 2020-06-11 ENCOUNTER — Telehealth: Payer: Self-pay | Admitting: Obstetrics and Gynecology

## 2020-06-11 NOTE — Telephone Encounter (Signed)
Pt called about scheduling surgery and I adv that [facility] was wanting to do it on 08/01/20 ([employer] proctored case). Pt seemed a little disappointed with the time. She adv that she had already met her deductible for this year and that she is "hurting again" She mentioned that she was feeling better last week when she came in but the pain has returned.   I adv that CRS was in OR today but that I will discuss her concerns with CRS when she returns to clinic tomorrow. I adv pt that I would call her after talking w [facility]. 

## 2020-06-12 ENCOUNTER — Telehealth: Payer: BC Managed Care – PPO | Admitting: Psychiatry

## 2020-06-17 ENCOUNTER — Telehealth: Payer: Self-pay

## 2020-06-17 NOTE — Telephone Encounter (Signed)
I'm going to call and offer 12/21 or 30. I assume you will change to [facility]/BS & Cystoscopy. Please adv

## 2020-06-17 NOTE — Telephone Encounter (Signed)
Pt calling; has started spotting again this weekend; hurting as well and it's getting worse.  [phone]  Pt aware CRS not in office until tomorrow.  Pt wants to proceed c hyst - deductible is met; back hurts with the pressure she is having so thinks this is the cause of her back pain as well.  Adv can take IBU 636m q6h or [id number] and alternative c e.s. tylenol like she has done in the past.  Will send msg to [facility].

## 2020-06-17 NOTE — Telephone Encounter (Signed)
Do you think you can get this patient scheduled in December?

## 2020-06-18 ENCOUNTER — Other Ambulatory Visit: Payer: Self-pay

## 2020-06-18 ENCOUNTER — Encounter: Payer: Self-pay | Admitting: Obstetrics and Gynecology

## 2020-06-18 ENCOUNTER — Ambulatory Visit (INDEPENDENT_AMBULATORY_CARE_PROVIDER_SITE_OTHER): Payer: BC Managed Care – PPO | Admitting: Obstetrics and Gynecology

## 2020-06-18 VITALS — BP 128/72 | Ht 64.0 in | Wt 238.0 lb

## 2020-06-18 DIAGNOSIS — N83209 Unspecified ovarian cyst, unspecified side: Secondary | ICD-10-CM

## 2020-06-18 DIAGNOSIS — N950 Postmenopausal bleeding: Secondary | ICD-10-CM

## 2020-06-18 DIAGNOSIS — R102 Pelvic and perineal pain: Secondary | ICD-10-CM | POA: Diagnosis not present

## 2020-06-18 NOTE — Progress Notes (Signed)
Patient ID: Kelly [name], female   DOB: 1966-02-28, 54 y.o.   MRN: [id number]  Reason for Consult: Pre-op Exam   Referred by Steele Sizer, MD  Subjective:     HPI:  Kelly [name] is a 54 y.o. female. She has been seen for postmenopausal bleeding and pelvic pain. She had a hysteroscopy D&C which helped her feel significantly better. Pathology was normal. She has a continued sensation of pelvic fullness similar to pregnancy and desires definitive therapy of he bleeding and pain by hysterectomy.   Pap:  ASCUS HPV negative 9/10/ 2021 Endometrial biopsy: proliferative phase endometrium 05/29/2020  She has a normal appearing left ovarian cyst. ROMA testing was normal. If it can be removed at the time of hysterectomy she would like that, but it may not be easily visible given its small size.    Past Medical History:  Diagnosis Date  . Allergy   . Anxiety   . Depression   . GERD (gastroesophageal reflux disease)   . History of kidney stones   . Insomnia   . Intermittent low back pain   . Migraines   . Osteoarthritis   . Recurrent UTI   . Restless leg syndrome   . Sciatica of right side   . Symptomatic menopausal or female climacteric states   . Vertigo   . Vitamin D deficiency    Family History  Problem Relation Age of Onset  . Cancer Paternal Grandmother   . Anxiety disorder Cousin   . Depression Cousin    Past Surgical History:  Procedure Laterality Date  . BACK SURGERY    . COLONOSCOPY WITH PROPOFOL N/A 02/03/2019   Procedure: COLONOSCOPY WITH PROPOFOL;  Surgeon: Jonathon Bellows, MD;  Location: [facility] ENDOSCOPY;  Service: Gastroenterology;  Laterality: N/A;  . COLONOSCOPY WITH PROPOFOL N/A 03/10/2019   Procedure: COLONOSCOPY WITH PROPOFOL;  Surgeon: Jonathon Bellows, MD;  Location: [facility] ENDOSCOPY;  Service: Gastroenterology;  Laterality: N/A;  . DIAGNOSTIC LAPAROSCOPY    . DILATION AND CURETTAGE OF UTERUS    . HYSTEROSCOPY WITH D & C N/A 05/29/2020   Procedure:  DILATATION AND CURETTAGE /HYSTEROSCOPY;  Surgeon: Homero Fellers, MD;  Location: [facility] ORS;  Service: Gynecology;  Laterality: N/A;  . KNEE ARTHROSCOPY WITH MEDIAL MENISECTOMY Left 01/14/2017   Procedure: KNEE ARTHROSCOPY WITH MEDIAL AND LATERAL MENISECTOMY;  Surgeon: Hessie Knows, MD;  Location: [facility] ORS;  Service: Orthopedics;  Laterality: Left;  Partial Knee menisectomy  . KNEE SURGERY Left 05/2011   Dr. Rudene Christians- Arthroscopic  . LUMBAR LAMINECTOMY/DECOMPRESSION MICRODISCECTOMY N/A 09/16/2016   Procedure: LUMBAR LAMINECTOMY/DECOMPRESSION MICRODISCECTOMY 1 LEVEL L5-S1;  Surgeon: Meade Maw, MD;  Location: [facility] ORS;  Service: Neurosurgery;  Laterality: N/A;  . TONSILLECTOMY AND ADENOIDECTOMY    . URETHRAL STRICTURE DILATATION      Short Social History:  Social History   Tobacco Use  . Smoking status: Former Smoker    Packs/day: 0.75    Years: 2.00    Pack years: 1.50    Types: Cigarettes    Start date: 07/06/1981    Quit date: 07/07/1983    Years since quitting: 36.9  . Smokeless tobacco: Never Used  Substance Use Topics  . Alcohol use: No    Alcohol/week: 0.0 standard drinks    Allergies  Allergen Reactions  . Penicillins Anaphylaxis, Hives, Nausea And Vomiting and Swelling    Has patient had a PCN reaction causing immediate rash, facial/tongue/throat swelling, SOB or lightheadedness with hypotension: Yes Has patient had a PCN reaction  causing severe rash involving mucus membranes or skin necrosis: Yes Has patient had a PCN reaction that required hospitalization No Has patient had a PCN reaction occurring within the last 10 years: Yes If all of the above answers are "NO", then may proceed with Cephalosporin use.   . Chlorhexidine Gluconate Itching and Rash  . Ciprofloxacin Hives  . Clindamycin/Lincomycin Hives  . Erythromycin Hives and Nausea And Vomiting  . Keflex [Cephalexin] Hives  . Lyrica [Pregabalin]     Dizziness, syncope  . Nitrofurantoin Monohyd Macro Hives  and Nausea And Vomiting  . Other Hives  . Sulfa Antibiotics Hives and Nausea And Vomiting    Rapid heart rate  . Tetracyclines & Related Hives  . Adhesive [Tape] Rash  . Latex Rash  . Vancomycin Itching and Rash    Current Outpatient Medications  Medication Sig Dispense Refill  . busPIRone (BUSPAR) 7.5 MG tablet Take 1 tablet (7.5 mg total) by mouth daily. (Patient taking differently: Take 7.5 mg by mouth at bedtime. ) 90 tablet 0  . cefdinir (OMNICEF) 300 MG capsule Take 300 mg by mouth 2 (two) times daily.    . fluticasone (FLONASE) 50 MCG/ACT nasal spray Place 2 sprays into both nostrils daily. (Patient not taking: Reported on 05/28/2020) 16 g 2  . gabapentin (NEURONTIN) 300 MG capsule 300 mg qAM, 600 mg QHS 90 capsule 5  . loratadine (CLARITIN) 10 MG tablet Take 1 tablet (10 mg total) by mouth daily. (Patient taking differently: Take 10 mg by mouth daily as needed for allergies. ) 90 tablet 3  . mirtazapine (REMERON) 7.5 MG tablet Take 1 tablet (7.5 mg total) by mouth at bedtime. For sleep and anxiety 90 tablet 0  . omeprazole (PRILOSEC) 40 MG capsule Take 1 capsule (40 mg total) by mouth every morning. (Patient taking differently: Take 40 mg by mouth at bedtime. ) 90 capsule 1  . prazosin (MINIPRESS) 1 MG capsule Take 1 capsule (1 mg total) by mouth at bedtime. 90 capsule 0  . rOPINIRole (REQUIP) 1 MG tablet Take 1 tablet (1 mg total) by mouth at bedtime. 90 tablet 1  . SUMAtriptan (IMITREX) 100 MG tablet Take 1 tablet (100 mg total) by mouth every 2 (two) hours as needed for migraine. May repeat in 2 hours if headache persists or recurs. 10 tablet 0  . topiramate (TOPAMAX) 100 MG tablet Take 1 tablet (100 mg total) by mouth every evening. 90 tablet 1  . venlafaxine XR (EFFEXOR XR) 75 MG 24 hr capsule Take 1 capsule (75 mg total) by mouth daily with breakfast. To be combined with 150 mg daily (Patient taking differently: Take 75 mg by mouth at bedtime. 150 mg + 75 mg=225 mg) 90 capsule 1   . venlafaxine XR (EFFEXOR-XR) 150 MG 24 hr capsule Take 1 capsule (150 mg total) by mouth daily with breakfast. To be combined with 75 mg daily (Patient taking differently: Take 150 mg by mouth at bedtime. 150 mg + 75 mg=225 mg) 90 capsule 1   No current facility-administered medications for this visit.    Review of Systems  Constitutional: Negative for chills, fatigue, fever and unexpected weight change.  HENT: Negative for trouble swallowing.  Eyes: Negative for loss of vision.  Respiratory: Negative for cough, shortness of breath and wheezing.  Cardiovascular: Negative for chest pain, leg swelling, palpitations and syncope.  GI: Negative for abdominal pain, blood in stool, diarrhea, nausea and vomiting.  GU: Negative for difficulty urinating, dysuria, frequency and hematuria.  Musculoskeletal: Negative for back pain, leg pain and joint pain.  Skin: Negative for rash.  Neurological: Negative for dizziness, headaches, light-headedness, numbness and seizures.  Psychiatric: Negative for behavioral problem, confusion, depressed mood and sleep disturbance.        Objective:  Objective   Vitals:   06/18/20 1105  BP: 128/72  Weight: 238 lb (108 kg)  Height: 5\' 4"  (1.626 m)   Body mass index is 40.85 kg/m.  Physical Exam Vitals and nursing note reviewed.  Constitutional:      Appearance: She is well-developed and well-nourished.  HENT:     Head: Normocephalic and atraumatic.  Eyes:     Extraocular Movements: EOM normal.     Pupils: Pupils are equal, round, and reactive to light.  Cardiovascular:     Rate and Rhythm: Normal rate and regular rhythm.  Pulmonary:     Effort: Pulmonary effort is normal. No respiratory distress.  Skin:    General: Skin is warm and dry.  Neurological:     Mental Status: She is alert and oriented to person, place, and time.  Psychiatric:        Mood and Affect: Mood and affect normal.        Behavior: Behavior normal.        Thought Content:  Thought content normal.        Judgment: Judgment normal.     Assessment/Plan:     54 yo with pelvic pain and postmenopausal bleeding, desires definitive therapy by hysterectomy, bilateral salpingectomy, cystoscopy, possible left ovarian cystectomy  I have had a careful discussion with this patient about all the options available and the risk/benefits of each. I have fully informed this patient that a hysterectomy may subject her to a variety of discomforts and risks: She understands that most patients have surgery with little difficulty, but problems can happen ranging from minor to fatal. These include nausea, vomiting, pain, bleeding, infection, poor healing, hernia, wound separation, vaginal cuff separation or formation of adhesions. Unexpected reactions may occur from any drug or anesthetic given. Unintended injury may occur to other pelvic or abdominal structures such as Fallopian tubes, ovaries, bladder, ureter (tube from kidney to bladder), or bowel. Nerves going from the pelvis to the legs may be injured. Any such injury may require immediate or later additional surgery to correct the problem. Excessive blood loss requiring transfusion is very unlikely but possible. Dangerous blood clots may form in the legs or lungs. Physical and sexual activity will be restricted in varying degrees for an indeterminate period of time but most often 2-4 weeks. She understands that the plan is to do this laparoscopically, however, there is a chance that this will need to be performed via a larger incision. She may be hospitalized overnight. Finally, she understands that it is impossible to list every possible undesirable effect and that the condition for which surgery is done is not always cured or significantly improved, and in rare cases may be even worse. I have also counseled her extensively about the pros and cons of ovarian conservation versus removal. Ample time was given to answer all questions.  More  than 25 minutes were spent face to face with the patient in the room, reviewing the medical record, labs and images, and coordinating care for the patient. The plan of management was discussed in detail and counseling was provided.     Adrian Prows MD [facility], [facility] 06/18/2020 11:41 AM

## 2020-06-18 NOTE — H&P (View-Only) (Signed)
Patient ID: Kelly [name], female   DOB: May 04, 1966, 54 y.o.   MRN: [id number]  Reason for Consult: Pre-op Exam   Referred by Steele Sizer, MD  Subjective:     HPI:  Kelly [name] is a 54 y.o. female. She has been seen for postmenopausal bleeding and pelvic pain. She had a hysteroscopy D&C which helped her feel significantly better. Pathology was normal. She has a continued sensation of pelvic fullness similar to pregnancy and desires definitive therapy of he bleeding and pain by hysterectomy.   Pap:  ASCUS HPV negative 9/10/ 2021 Endometrial biopsy: proliferative phase endometrium 05/29/2020  She has a normal appearing left ovarian cyst. ROMA testing was normal. If it can be removed at the time of hysterectomy she would like that, but it may not be easily visible given its small size.    Past Medical History:  Diagnosis Date   Allergy    Anxiety    Depression    GERD (gastroesophageal reflux disease)    History of kidney stones    Insomnia    Intermittent low back pain    Migraines    Osteoarthritis    Recurrent UTI    Restless leg syndrome    Sciatica of right side    Symptomatic menopausal or female climacteric states    Vertigo    Vitamin D deficiency    Family History  Problem Relation Age of Onset   Cancer Paternal Grandmother    Anxiety disorder Cousin    Depression Cousin    Past Surgical History:  Procedure Laterality Date   BACK SURGERY     COLONOSCOPY WITH PROPOFOL N/A 02/03/2019   Procedure: COLONOSCOPY WITH PROPOFOL;  Surgeon: Jonathon Bellows, MD;  Location: [facility] ENDOSCOPY;  Service: Gastroenterology;  Laterality: N/A;   COLONOSCOPY WITH PROPOFOL N/A 03/10/2019   Procedure: COLONOSCOPY WITH PROPOFOL;  Surgeon: Jonathon Bellows, MD;  Location: [facility] ENDOSCOPY;  Service: Gastroenterology;  Laterality: N/A;   DIAGNOSTIC LAPAROSCOPY     DILATION AND CURETTAGE OF UTERUS     HYSTEROSCOPY WITH D & C N/A 05/29/2020   Procedure:  DILATATION AND CURETTAGE /HYSTEROSCOPY;  Surgeon: Homero Fellers, MD;  Location: [facility] ORS;  Service: Gynecology;  Laterality: N/A;   KNEE ARTHROSCOPY WITH MEDIAL MENISECTOMY Left 01/14/2017   Procedure: KNEE ARTHROSCOPY WITH MEDIAL AND LATERAL MENISECTOMY;  Surgeon: Hessie Knows, MD;  Location: [facility] ORS;  Service: Orthopedics;  Laterality: Left;  Partial Knee menisectomy   KNEE SURGERY Left 05/2011   Dr. Rudene Christians- Arthroscopic   LUMBAR LAMINECTOMY/DECOMPRESSION MICRODISCECTOMY N/A 09/16/2016   Procedure: LUMBAR LAMINECTOMY/DECOMPRESSION MICRODISCECTOMY 1 LEVEL L5-S1;  Surgeon: Meade Maw, MD;  Location: [facility] ORS;  Service: Neurosurgery;  Laterality: N/A;   TONSILLECTOMY AND ADENOIDECTOMY     URETHRAL STRICTURE DILATATION      Short Social History:  Social History   Tobacco Use   Smoking status: Former Smoker    Packs/day: 0.75    Years: 2.00    Pack years: 1.50    Types: Cigarettes    Start date: 07/06/1981    Quit date: 07/07/1983    Years since quitting: 36.9   Smokeless tobacco: Never Used  Substance Use Topics   Alcohol use: No    Alcohol/week: 0.0 standard drinks    Allergies  Allergen Reactions   Penicillins Anaphylaxis, Hives, Nausea And Vomiting and Swelling    Has patient had a PCN reaction causing immediate rash, facial/tongue/throat swelling, SOB or lightheadedness with hypotension: Yes Has patient had a PCN reaction  causing severe rash involving mucus membranes or skin necrosis: Yes Has patient had a PCN reaction that required hospitalization No Has patient had a PCN reaction occurring within the last 10 years: Yes If all of the above answers are "NO", then may proceed with Cephalosporin use.    Chlorhexidine Gluconate Itching and Rash   Ciprofloxacin Hives   Clindamycin/Lincomycin Hives   Erythromycin Hives and Nausea And Vomiting   Keflex [Cephalexin] Hives   Lyrica [Pregabalin]     Dizziness, syncope   Nitrofurantoin Monohyd Macro Hives  and Nausea And Vomiting   Other Hives   Sulfa Antibiotics Hives and Nausea And Vomiting    Rapid heart rate   Tetracyclines & Related Hives   Adhesive [Tape] Rash   Latex Rash   Vancomycin Itching and Rash    Current Outpatient Medications  Medication Sig Dispense Refill   busPIRone (BUSPAR) 7.5 MG tablet Take 1 tablet (7.5 mg total) by mouth daily. (Patient taking differently: Take 7.5 mg by mouth at bedtime. ) 90 tablet 0   cefdinir (OMNICEF) 300 MG capsule Take 300 mg by mouth 2 (two) times daily.     fluticasone (FLONASE) 50 MCG/ACT nasal spray Place 2 sprays into both nostrils daily. (Patient not taking: Reported on 05/28/2020) 16 g 2   gabapentin (NEURONTIN) 300 MG capsule 300 mg qAM, 600 mg QHS 90 capsule 5   loratadine (CLARITIN) 10 MG tablet Take 1 tablet (10 mg total) by mouth daily. (Patient taking differently: Take 10 mg by mouth daily as needed for allergies. ) 90 tablet 3   mirtazapine (REMERON) 7.5 MG tablet Take 1 tablet (7.5 mg total) by mouth at bedtime. For sleep and anxiety 90 tablet 0   omeprazole (PRILOSEC) 40 MG capsule Take 1 capsule (40 mg total) by mouth every morning. (Patient taking differently: Take 40 mg by mouth at bedtime. ) 90 capsule 1   prazosin (MINIPRESS) 1 MG capsule Take 1 capsule (1 mg total) by mouth at bedtime. 90 capsule 0   rOPINIRole (REQUIP) 1 MG tablet Take 1 tablet (1 mg total) by mouth at bedtime. 90 tablet 1   SUMAtriptan (IMITREX) 100 MG tablet Take 1 tablet (100 mg total) by mouth every 2 (two) hours as needed for migraine. May repeat in 2 hours if headache persists or recurs. 10 tablet 0   topiramate (TOPAMAX) 100 MG tablet Take 1 tablet (100 mg total) by mouth every evening. 90 tablet 1   venlafaxine XR (EFFEXOR XR) 75 MG 24 hr capsule Take 1 capsule (75 mg total) by mouth daily with breakfast. To be combined with 150 mg daily (Patient taking differently: Take 75 mg by mouth at bedtime. 150 mg + 75 mg=225 mg) 90 capsule 1    venlafaxine XR (EFFEXOR-XR) 150 MG 24 hr capsule Take 1 capsule (150 mg total) by mouth daily with breakfast. To be combined with 75 mg daily (Patient taking differently: Take 150 mg by mouth at bedtime. 150 mg + 75 mg=225 mg) 90 capsule 1   No current facility-administered medications for this visit.    Review of Systems  Constitutional: Negative for chills, fatigue, fever and unexpected weight change.  HENT: Negative for trouble swallowing.  Eyes: Negative for loss of vision.  Respiratory: Negative for cough, shortness of breath and wheezing.  Cardiovascular: Negative for chest pain, leg swelling, palpitations and syncope.  GI: Negative for abdominal pain, blood in stool, diarrhea, nausea and vomiting.  GU: Negative for difficulty urinating, dysuria, frequency and hematuria.  Musculoskeletal: Negative for back pain, leg pain and joint pain.  Skin: Negative for rash.  Neurological: Negative for dizziness, headaches, light-headedness, numbness and seizures.  Psychiatric: Negative for behavioral problem, confusion, depressed mood and sleep disturbance.        Objective:  Objective   Vitals:   06/18/20 1105  BP: 128/72  Weight: 238 lb (108 kg)  Height: 5\' 4"  (1.626 m)   Body mass index is 40.85 kg/m.  Physical Exam Vitals and nursing note reviewed.  Constitutional:      Appearance: She is well-developed and well-nourished.  HENT:     Head: Normocephalic and atraumatic.  Eyes:     Extraocular Movements: EOM normal.     Pupils: Pupils are equal, round, and reactive to light.  Cardiovascular:     Rate and Rhythm: Normal rate and regular rhythm.  Pulmonary:     Effort: Pulmonary effort is normal. No respiratory distress.  Skin:    General: Skin is warm and dry.  Neurological:     Mental Status: She is alert and oriented to person, place, and time.  Psychiatric:        Mood and Affect: Mood and affect normal.        Behavior: Behavior normal.        Thought Content:  Thought content normal.        Judgment: Judgment normal.     Assessment/Plan:     54 yo with pelvic pain and postmenopausal bleeding, desires definitive therapy by hysterectomy, bilateral salpingectomy, cystoscopy, possible left ovarian cystectomy  I have had a careful discussion with this patient about all the options available and the risk/benefits of each. I have fully informed this patient that a hysterectomy may subject her to a variety of discomforts and risks: She understands that most patients have surgery with little difficulty, but problems can happen ranging from minor to fatal. These include nausea, vomiting, pain, bleeding, infection, poor healing, hernia, wound separation, vaginal cuff separation or formation of adhesions. Unexpected reactions may occur from any drug or anesthetic given. Unintended injury may occur to other pelvic or abdominal structures such as Fallopian tubes, ovaries, bladder, ureter (tube from kidney to bladder), or bowel. Nerves going from the pelvis to the legs may be injured. Any such injury may require immediate or later additional surgery to correct the problem. Excessive blood loss requiring transfusion is very unlikely but possible. Dangerous blood clots may form in the legs or lungs. Physical and sexual activity will be restricted in varying degrees for an indeterminate period of time but most often 2-4 weeks. She understands that the plan is to do this laparoscopically, however, there is a chance that this will need to be performed via a larger incision. She may be hospitalized overnight. Finally, she understands that it is impossible to list every possible undesirable effect and that the condition for which surgery is done is not always cured or significantly improved, and in rare cases may be even worse. I have also counseled her extensively about the pros and cons of ovarian conservation versus removal. Ample time was given to answer all questions.  More  than 25 minutes were spent face to face with the patient in the room, reviewing the medical record, labs and images, and coordinating care for the patient. The plan of management was discussed in detail and counseling was provided.     Adrian Prows MD [facility], [facility] 06/18/2020 11:41 AM

## 2020-06-18 NOTE — Patient Instructions (Signed)
Total Laparoscopic Hysterectomy A total laparoscopic hysterectomy is a minimally invasive surgery to remove the uterus and cervix. The fallopian tubes and ovaries can also be removed (bilateral salpingo-oophorectomy) during this surgery, if necessary. This procedure may be done to treat problems such as:  Noncancerous growths in the uterus (uterine fibroids) that cause symptoms.  A condition that causes the lining of the uterus (endometrium) to grow in other areas (endometriosis).  Problems with pelvic support. This is caused by weakened muscles of the pelvis following vaginal childbirth or menopause.  Cancer of the cervix, ovaries, uterus, or endometrium.  Excessive (dysfunctional) uterine bleeding. This surgery is performed by inserting a thin, lighted tube (laparoscope) and surgical instruments into small incisions in the abdomen. The laparoscope sends images to a monitor. The images help the health care provider perform the procedure. After this procedure, you will no longer be able to have a baby, and you will no longer have a menstrual period. Tell a health care provider about:  Any allergies you have.  All medicines you are taking, including vitamins, herbs, eye drops, creams, and over-the-counter medicines.  Any problems you or family members have had with anesthetic medicines.  Any blood disorders you have.  Any surgeries you have had.  Any medical conditions you have.  Whether you are pregnant or may be pregnant. What are the risks? Generally, this is a safe procedure. However, problems may occur, including:  Infection.  Bleeding.  Blood clots in the legs or lungs.  Allergic reactions to medicines.  Damage to other structures or organs.  The risk that the surgery may have to be switched to the regular one in which a large incision is made in the abdomen (abdominal hysterectomy). What happens before the procedure? Staying hydrated Follow instructions from your  health care provider about hydration, which may include:  Up to 2 hours before the procedure - you may continue to drink clear liquids, such as water, clear fruit juice, black coffee, and plain tea Eating and drinking restrictions Follow instructions from your health care provider about eating and drinking, which may include:  8 hours before the procedure - stop eating heavy meals or foods such as meat, fried foods, or fatty foods.  6 hours before the procedure - stop eating light meals or foods, such as toast or cereal.  6 hours before the procedure - stop drinking milk or drinks that contain milk.  2 hours before the procedure - stop drinking clear liquids. Medicines  Ask your health care provider about: ? Changing or stopping your regular medicines. This is especially important if you are taking diabetes medicines or blood thinners. ? Taking over-the-counter medicines, vitamins, herbs, and supplements. ? Taking medicines such as aspirin and ibuprofen. These medicines can thin your blood. Do not take these medicines unless your health care provider tells you to take them.  You may be given antibiotic medicine to help prevent infection.  You may be asked to take laxatives.  You may be given medicines to help prevent nausea and vomiting after the procedure. General instructions  Ask your health care provider how your surgical site will be marked or identified.  You may be asked to shower with a germ-killing soap.  Do not use any products that contain nicotine or tobacco, such as cigarettes and e-cigarettes. If you need help quitting, ask your health care provider.  You may have an exam or testing, such as an ultrasound to determine the size and shape of your pelvic organs.    You may have a blood or urine sample taken.  This procedure can affect the way you feel about yourself. Talk with your health care provider about the physical and emotional changes hysterectomy may  cause.  Plan to have someone take you home from the hospital or clinic.  Plan to have a responsible adult care for you for at least 24 hours after you leave the hospital or clinic. This is important. What happens during the procedure?  To lower your risk of infection: ? Your health care team will wash or sanitize their hands. ? Your skin will be washed with soap. ? Hair may be removed from the surgical area.  An IV will be inserted into one of your veins.  You will be given one or more of the following: ? A medicine to help you relax (sedative). ? A medicine to make you fall asleep (general anesthetic).  You will be given antibiotic medicine through your IV.  A tube may be inserted down your throat to help you breathe during the procedure.  A gas (carbon dioxide) will be used to inflate your abdomen to allow your surgeon to see inside of your abdomen.  Three or four small incisions will be made in your abdomen.  A laparoscope will be inserted into one of your incisions. Surgical instruments will be inserted through the other incisions in order to perform the procedure.  Your uterus and cervix may be removed through your vagina or cut into small pieces and removed through the small incisions. Any other organs that need to be removed will also be removed this way.  Carbon dioxide will be released from inside of your abdomen.  Your incisions will be closed with stitches (sutures).  A bandage (dressing) may be placed over your incisions. The procedure may vary among health care providers and hospitals. What happens after the procedure?  Your blood pressure, heart rate, breathing rate, and blood oxygen level will be monitored until the medicines you were given have worn off.  You will be given medicine for pain and nausea as needed.  Do not drive for 24 hours if you received a sedative. Summary  Total Laparoscopic hysterectomy is a procedure to remove your uterus, cervix and  sometimes the fallopian tubes and ovaries.  This procedure can affect the way you feel about yourself. Talk with your health care provider about the physical and emotional changes hysterectomy may cause.  After this procedure, you will no longer be able to have a baby, and you will no longer have a menstrual period.  You will be given pain medicine to control discomfort after this procedure. This information is not intended to replace advice given to you by your health care provider. Make sure you discuss any questions you have with your health care provider. Document Revised: 06/04/2017 Document Reviewed: 09/02/2016 Elsevier Patient Education  2020 [employer].

## 2020-06-18 NOTE — Progress Notes (Signed)
H&P ( hysterectomy)

## 2020-06-18 NOTE — Progress Notes (Signed)
Patient is going next week for hysterectomy, is hoping that this may be a remedy for some of the pain.

## 2020-06-19 ENCOUNTER — Ambulatory Visit
Payer: BC Managed Care – PPO | Attending: Student in an Organized Health Care Education/Training Program | Admitting: Student in an Organized Health Care Education/Training Program

## 2020-06-19 ENCOUNTER — Encounter: Payer: Self-pay | Admitting: Student in an Organized Health Care Education/Training Program

## 2020-06-19 DIAGNOSIS — G894 Chronic pain syndrome: Secondary | ICD-10-CM

## 2020-06-19 DIAGNOSIS — M47816 Spondylosis without myelopathy or radiculopathy, lumbar region: Secondary | ICD-10-CM | POA: Diagnosis not present

## 2020-06-19 DIAGNOSIS — M4317 Spondylolisthesis, lumbosacral region: Secondary | ICD-10-CM

## 2020-06-19 NOTE — Progress Notes (Signed)
Patient: Kelly [name]  Service Category: E/M  Provider: Gillis Santa, MD  DOB: Jan 09, 1966  DOS: 06/19/2020  Location: Office  MRN: [id number]  Setting: Ambulatory outpatient  Referring Provider: Steele Sizer, MD  Type: Established Patient  Specialty: Interventional Pain Management  PCP: Steele Sizer, MD  Location: Home  Delivery: TeleHealth     Virtual Encounter - Pain Management PROVIDER NOTE: Information contained herein reflects review and annotations entered in association with encounter. Interpretation of such information and data should be left to medically-trained personnel. Information provided to patient can be located elsewhere in the medical record under "Patient Instructions". Document created using STT-dictation technology, any transcriptional errors that may result from process are unintentional.    Contact & Pharmacy Preferred: [phone] Home: [phone] (home) Mobile: [phone] (mobile) E-mail: [email or web address] .[place] [place], [place] [address] Phone: [phone] Fax: [phone]   Pre-screening  Kelly [name] offered "in-person" vs "virtual" encounter. She indicated preferring virtual for this encounter.   Reason COVID-19*  Social distancing based on CDC and AMA recommendations.   I contacted Kelly [name] on 06/19/2020 via video conference.      I clearly identified myself as Gillis Santa, MD. I verified that I was speaking with the correct person using two identifiers (Name: Kelly [name], and date of birth: 1965/08/07).  Consent I sought verbal advanced consent from Kelly [name] for virtual visit interactions. I informed Kelly [name] of possible security and privacy concerns, risks, and limitations associated with providing "not-in-person" medical evaluation and management services. I also informed Kelly [name] of the availability of "in-person"  appointments. Finally, I informed her that there would be a charge for the virtual visit and that she could be  personally, fully or partially, financially responsible for it. Kelly [name] expressed understanding and agreed to proceed.   Historic Elements   Kelly [name] is a 54 y.o. year old, female patient evaluated today after our last contact on 05/15/2020. Kelly [name]  has a past medical history of Allergy, Anxiety, Depression, GERD (gastroesophageal reflux disease), History of kidney stones, Insomnia, Intermittent low back pain, Migraines, Osteoarthritis, Recurrent UTI, Restless leg syndrome, Sciatica of right side, Symptomatic menopausal or female climacteric states, Vertigo, and Vitamin D deficiency. She also  has a past surgical history that includes Tonsillectomy and adenoidectomy; Urethra dilation; Knee surgery (Left, 05/2011); Lumbar laminectomy/decompression microdiscectomy (N/A, 09/16/2016); Back surgery; Diagnostic laparoscopy; Dilation and curettage of uterus; Knee arthroscopy with medial menisectomy (Left, 01/14/2017); Colonoscopy with propofol (N/A, 02/03/2019); Colonoscopy with propofol (N/A, 03/10/2019); and Hysteroscopy with D & C (N/A, 05/29/2020). Kelly [name] has a current medication list which includes the following prescription(s): buspirone, cefdinir, mirtazapine, prazosin, ropinirole, sumatriptan, topiramate, acetaminophen, hydroxyzine, ibuprofen, naproxen sodium, omeprazole, venlafaxine xr, and venlafaxine xr. She  reports that she quit smoking about 36 years ago. Her smoking use included cigarettes. She started smoking about 38 years ago. She has a 1.50 pack-year smoking history. She has never used smokeless tobacco. She reports that she does not drink alcohol and does not use drugs. Kelly [name] is allergic to penicillins, chlorhexidine gluconate, ciprofloxacin, clindamycin/lincomycin, erythromycin, keflex [cephalexin], lyrica [pregabalin], nitrofurantoin monohyd macro, sulfa  antibiotics, tetracyclines & related, adhesive [tape], latex, and vancomycin.   HPI  Today, she is being contacted for a post-procedure assessment.   Post-Procedure Evaluation  Procedure (05/15/2020): Type: Therapeutic Medial Branch Facet Block with steroid #4 (#3 done 12/20/19) Region: Lumbar Level: L2/3, L3/4, L4/5  Laterality: Bilateral  Sedation: Please see nurses note.  Effectiveness during initial hour after procedure(Ultra-Short Term Relief): 100 %   Local anesthetic used: Long-acting (4-6 hours) Effectiveness: Defined as any analgesic benefit obtained secondary to the administration of local anesthetics. This carries significant diagnostic value as to the etiological location, or anatomical origin, of the pain. Duration of benefit is expected to coincide with the duration of the local anesthetic used.  Effectiveness during initial 4-6 hours after procedure(Short-Term Relief): 100 %   Long-term benefit: Defined as any relief past the pharmacologic duration of the local anesthetics.  Effectiveness past the initial 6 hours after procedure(Long-Term Relief): 100 % (pain relief up until about 2 weeks ago and then gradually returned.)  Current benefits: Defined as benefit that persist at this time.   Analgesia:  <50% better Function: Kelly [name] reports improvement in function ROM: Kelly [name] reports improvement in ROM    Laboratory Chemistry Profile   Renal Lab Results  Component Value Date   BUN 14 05/22/2020   CREATININE 0.82 08/65/7846   BCR NOT APPLICABLE 96/29/5284   GFRAA 91 03/21/2020   GFRNONAA >60 05/22/2020     Hepatic Lab Results  Component Value Date   AST 18 05/22/2020   ALT 14 05/22/2020   ALBUMIN 4.1 05/22/2020   ALKPHOS 63 05/22/2020   LIPASE 23 05/22/2020     Electrolytes Lab Results  Component Value Date   NA 136 05/22/2020   K 3.8 05/22/2020   CL 108 05/22/2020   CALCIUM 8.9 05/22/2020     Bone Lab Results  Component Value Date    VD25OH 37 03/31/2019     Inflammation (CRP: Acute Phase) (ESR: Chronic Phase) No results found for: CRP, ESRSEDRATE, LATICACIDVEN     Note: Above Lab results reviewed.  Imaging  US PELVIC COMPLETE W TRANSVAGINAL AND TORSION R/O CLINICAL DATA:  Pelvic pain, postmenopausal bleeding for 3 months  EXAM: TRANSABDOMINAL AND TRANSVAGINAL ULTRASOUND OF PELVIS  DOPPLER ULTRASOUND OF OVARIES  TECHNIQUE: Both transabdominal and transvaginal ultrasound examinations of the pelvis were performed. Transabdominal technique was performed for global imaging of the pelvis including uterus, ovaries, adnexal regions, and pelvic cul-de-sac.  It was necessary to proceed with endovaginal exam following the transabdominal exam to visualize the endometrium. Color and duplex Doppler ultrasound was utilized to evaluate blood flow to the ovaries.  COMPARISON:  03/25/2020  FINDINGS: Uterus  Measurements: 11.4 x 5.7 x 6.8 cm = volume: 233 mL. Anteverted. Tiny nabothian cysts at cervix. Heterogeneous myometrium. No focal uterine mass.  Endometrium  Thickness: 8 mm.  No endometrial fluid or focal abnormality  Right ovary  Measurements: 2.0 x 2.4 x 1.9 cm = volume: None 4.7 mL. Seen only on transabdominal imaging, obscured on transvaginal imaging by bowel. Normal morphology without mass. Internal blood flow present on color Doppler imaging.  Left ovary  Measurements: 3.7 x 2.1 x 4.2 cm = volume: 17.1 mL. Cyst within LEFT ovary 3.6 x 3.3 x 2.0 cm, with minimal dependent debris. No mural nodularity or septations. No abnormal internal blood flow within the lesion. Blood flow present within LEFT ovary on color Doppler imaging.  Pulsed Doppler evaluation of both ovaries demonstrates normal low-resistance arterial and venous waveforms.  Other findings  No free pelvic fluid.  No adnexal masses.  IMPRESSION: Unremarkable uterus, endometrial complex and RIGHT ovary.  3.6 cm diameter cyst  within LEFT ovary containing minimal dependent debris.  No evidence of ovarian torsion.  8 mm thick endometrial complex, abnormal for a  postmenopausal patient with bleeding; in the setting of post-menopausal bleeding, endometrial sampling is indicated to exclude carcinoma. If results are benign, sonohysterogram should be considered for focal lesion work-up. (Ref: Radiological Reasoning: Algorithmic Workup of Abnormal Vaginal Bleeding with Endovaginal Sonography and Sonohysterography. AJR 2008; 767:M09-47)  These results will be called to the ordering clinician or representative by the Radiologist Assistant, and communication documented in the PACS or [employer].  Electronically Signed   By: Lavonia Dana M.D.   On: 05/28/2020 10:33  Assessment  The primary encounter diagnosis was Lumbar spondylosis. Diagnoses of Spondylolisthesis of lumbosacral region and Chronic pain syndrome were also pertinent to this visit.  Plan of Care   Patient has an upcoming hysterectomy scheduled for 06/25/2020. She is hoping that this can alleviate some of her pelvic pressure that she has been experiencing. She did obtain benefit after her lumbar facet medial branch nerve block for 3 to 4 weeks in regards to her low back pain. Now the patient is endorsing more radicular pain that radiates down bilateral lower extremities, left greater than right. She endorses numbness at times from her left hip to her left knee which she states is new. She denies any significant or obvious weakness. Denies any bowel or bladder dysfunction. I instructed the patient that she should follow-up 1 month after her hysterectomy in clinic so that we can assess how she is doing. If the patient is continue to have persistent radicular pain at that time, I would recommend repeating a lumbar MRI and considering lumbar epidural steroid injection and possible lumbar radiofrequency ablation. Patient is in agreement with this plan.  Orders:   Orders Placed This Encounter  Procedures  . LUMBAR FACET(MEDIAL BRANCH NERVE BLOCK) MBNB    Scheduling timeframe: (PRN procedure) Kelly [name] will call when needed. Clinical indication: Axial low back pain. Lumbosacral Spondylosis (M47.897).  Sedation: Usually done with sedation. (May be done without sedation if so desired by patient.) Requirements: NPO x 8 hrs.; Driver; Stop blood thinners. Interval: No sooner than two weeks for diagnostic or therapeutic. No sooner than every other month for palliative.    Standing Status:   Standing    Number of Occurrences:   5    Standing Expiration Date:   06/19/2021    Scheduling Instructions:     Procedure: Lumbar facet block (AKA.: Lumbosacral medial branch nerve block)     Level: L2-3, L3-4, L4-5,  Facets (L2, L3, L4, L5, Medial Branch Nerves)     Laterality: Bilateral    Order Specific Question:   Where will this procedure be performed?    Answer:   [facility]   Follow-up plan:   Return if symptoms worsen or fail to improve.   Recent Visits Date Type Provider Dept  05/15/20 Procedure visit Gillis Santa, MD [facility]  04/30/20 Office Visit Gillis Santa, MD [facility]  Showing recent visits within past 90 days and meeting all other requirements Today's Visits Date Type Provider Dept  06/19/20 Telemedicine Gillis Santa, MD [facility]  Showing today's visits and meeting all other requirements Future Appointments No visits were found meeting these conditions. Showing future appointments within next 90 days and meeting all other requirements  I discussed the assessment and treatment plan with the patient. The patient was provided an opportunity to ask questions and all were answered. The patient agreed with the plan and demonstrated an understanding of the instructions.  Patient advised to call back or seek an in-person evaluation  if the symptoms or condition worsens.  Duration of encounter:  34mnutes.  Note by: BGillis Santa MD Date: 06/19/2020; Time: 2:40 PM

## 2020-06-20 ENCOUNTER — Other Ambulatory Visit
Admission: RE | Admit: 2020-06-20 | Discharge: 2020-06-20 | Disposition: A | Discharge status: Home / Self Care | Payer: BC Managed Care – PPO | Source: Ambulatory Visit | Attending: Obstetrics and Gynecology | Admitting: Obstetrics and Gynecology

## 2020-06-20 DIAGNOSIS — Z87891 Personal history of nicotine dependence: Secondary | ICD-10-CM | POA: Insufficient documentation

## 2020-06-20 DIAGNOSIS — Z20822 Contact with and (suspected) exposure to covid-19: Secondary | ICD-10-CM | POA: Insufficient documentation

## 2020-06-20 DIAGNOSIS — Z882 Allergy status to sulfonamides status: Secondary | ICD-10-CM | POA: Insufficient documentation

## 2020-06-20 DIAGNOSIS — Z881 Allergy status to other antibiotic agents status: Secondary | ICD-10-CM | POA: Insufficient documentation

## 2020-06-20 DIAGNOSIS — D259 Leiomyoma of uterus, unspecified: Secondary | ICD-10-CM | POA: Insufficient documentation

## 2020-06-20 DIAGNOSIS — R102 Pelvic and perineal pain: Secondary | ICD-10-CM | POA: Insufficient documentation

## 2020-06-20 DIAGNOSIS — N950 Postmenopausal bleeding: Secondary | ICD-10-CM | POA: Insufficient documentation

## 2020-06-20 DIAGNOSIS — Z91040 Latex allergy status: Secondary | ICD-10-CM | POA: Insufficient documentation

## 2020-06-20 DIAGNOSIS — N800 Endometriosis of uterus: Secondary | ICD-10-CM | POA: Insufficient documentation

## 2020-06-20 DIAGNOSIS — Z79899 Other long term (current) drug therapy: Secondary | ICD-10-CM | POA: Insufficient documentation

## 2020-06-20 DIAGNOSIS — Z01812 Encounter for preprocedural laboratory examination: Secondary | ICD-10-CM | POA: Insufficient documentation

## 2020-06-20 NOTE — Patient Instructions (Addendum)
Your procedure is scheduled on: 06/25/20- Tuesday Report to the Registration Desk on the 1st floor of the [facility]. To find out your arrival time, please call [phone] between 1PM - 3PM on: 06/24/20- Monday  REMEMBER: Instructions that are not followed completely may result in serious medical risk, up to and including death; or upon the discretion of your surgeon and anesthesiologist your surgery may need to be rescheduled.  Do not eat food after midnight the night before surgery.  No gum chewing, lozengers or hard candies.  You may however, drink CLEAR liquids up to 2 hours before you are scheduled to arrive for your surgery. Do not drink anything within 2 hours of your scheduled arrival time.  Clear liquids include: - water  - apple juice without pulp - gatorade (not RED, PURPLE, OR BLUE) - black coffee or tea (Do NOT add milk or creamers to the coffee or tea) Do NOT drink anything that is not on this list.   TAKE THESE MEDICATIONS THE MORNING OF SURGERY WITH A SIP OF WATER:   - omeprazole (PRILOSEC) 40 MG capsule, (take one the night before and one on the morning of surgery - helps to prevent nausea after surgery.)   One week prior to surgery: STOP TAKING 06/20/20. Stop Anti-inflammatories (NSAIDS) such as Advil, Aleve, Ibuprofen, Motrin, Naproxen, Naprosyn and Aspirin based products such as Excedrin, Goodys Powder, BC Powder.  Stop ANY OVER THE COUNTER supplements until after surgery. (However, you may continue taking Vitamin D, Vitamin B, and multivitamin up until the day before surgery.)  No Alcohol for 24 hours before or after surgery.  No Smoking including e-cigarettes for 24 hours prior to surgery.  No chewable tobacco products for at least 6 hours prior to surgery.  No nicotine patches on the day of surgery.  Do not use any "recreational" drugs for at least a week prior to your surgery.  Please be advised that the combination of cocaine and anesthesia may  have negative outcomes, up to and including death. If you test positive for cocaine, your surgery will be cancelled.  On the morning of surgery brush your teeth with toothpaste and water, you may rinse your mouth with mouthwash if you wish. Do not swallow any toothpaste or mouthwash.  Do not wear jewelry, make-up, hairpins, clips or nail polish.  Do not wear lotions, powders, or perfumes.   Do not shave body from the neck down 48 hours prior to surgery just in case you cut yourself which could leave a site for infection.  Also, freshly shaved skin may become irritated if using the CHG soap.  Contact lenses, hearing aids and dentures may not be worn into surgery.  Do not bring valuables to the hospital. [facility] is not responsible for any missing/lost belongings or valuables.   Notify your doctor if there is any change in your medical condition (cold, fever, infection).  Wear comfortable clothing (specific to your surgery type) to the hospital.  Plan for stool softeners for home use; pain medications have a tendency to cause constipation. You can also help prevent constipation by eating foods high in fiber such as fruits and vegetables and drinking plenty of fluids as your diet allows.  After surgery, you can help prevent lung complications by doing breathing exercises.  Take deep breaths and cough every 1-2 hours. Your doctor may order a device called an Incentive Spirometer to help you take deep breaths. When coughing or sneezing, hold a pillow firmly against your  incision with both hands. This is called "splinting." Doing this helps protect your incision. It also decreases belly discomfort.  If you are being admitted to the hospital overnight, leave your suitcase in the car. After surgery it may be brought to your room.  If you are being discharged the day of surgery, you will not be allowed to drive home. You will need a responsible adult (18 years or older) to drive you home  and stay with you that night.   If you are taking public transportation, you will need to have a responsible adult (18 years or older) with you. Please confirm with your physician that it is acceptable to use public transportation.   Please call the [facility] at [phone] if you have any questions about these instructions.  Visitation Policy:  Patients undergoing a surgery or procedure may have one family member or support person with them as long as that person is not COVID-19 positive or experiencing its symptoms.  That person may remain in the waiting area during the procedure.  Inpatient Visitation Update:   In an effort to ensure the safety of our team members and our patients, we are implementing a change to our visitation policy:  Effective Monday, Aug. 9, at 7 a.m., inpatients will be allowed one support person.  o The support person may change daily.  o The support person must pass our screening, gel in and out, and wear a mask at all times, including in the patient's room.  o Patients must also wear a mask when staff or their support person are in the room.  o Masking is required regardless of vaccination status.  Systemwide, no visitors 17 or younger.

## 2020-06-20 NOTE — Progress Notes (Signed)
  [facility] Perioperative Services: Pre-Admission/Anesthesia Testing   Date: 06/20/20 Name: Kelly [name] MRN:   [id number]  Re: Consideration of preoperative prophylactic antibiotic change   Request sent to: Homero Fellers, MD (routed and/or faxed via [facility])  Planned Surgical Procedure(s):    Case: [id number] Date/Time: 06/25/20 1039   Procedure: TOTAL LAPAROSCOPIC HYSTERECTOMY WITH BILATERAL SALPINGECTOMY (Bilateral )   Anesthesia type: Choice   Pre-op diagnosis:      pelvic pain     postmenopausal bleeding   Location: [facility] OR ROOM 04 / [facility]   Surgeons: Homero Fellers, MD    Notes: 1. Patient has a documented allergy to PCN   2. Received cephalosporin with no documented complications . CEFDINIR - currently taking . CEFDINIR prescribed on 08/11/2016 . CEFUROXIME prescribed on 01/15/2015  Request:  As an evidence based approach to reducing the rate of incidence for post-operative SSI and the development of MDROs, could an agent with narrower coverage for preoperative prophylaxis in this patient's upcoming surgical course be considered?  1. Currently ordered preoperative prophylactic ABX: metronidazole and gentamicin.   2. Specifically requesting change to cephalosporin (CEFAZOLIN).   3. Please communicate decision with me and I will change the orders in Epic as per your direction.   Things to consider:  Many patients report that they were "allergic" to PCN earlier in life, however this does not translate into a true lifelong allergy. Patients can lose sensitivity to specific IgE antibodies over time if PCN is avoided (Kleris & Lugar, 2019).   Up to 10% of the adult population and 15% of hospitalized patients report an allergy to PCN, however clinical studies suggest that 90% of those reporting an allergy can tolerate PCN antibiotics (Kleris & Lugar, 2019).   Cross-sensitivity between PCN and cephalosporins has  been documented as being as high as 10%, however this estimation included data believed to have been collected in a setting where there was contamination. Newer data suggests that the prevalence of cross-sensitivity between PCN and cephalosporins is actually estimated to be closer to 1% (Hermanides et al., 2018).    Patients labeled as PCN allergic, whether they are truly allergic or not, have been found to have inferior outcomes in terms of rates of serious infection, and these patients tend to have longer hospital stays ([place], 2019).   Treatment related secondary infections, such as Clostridioides difficile, have been linked to the improper use of broad spectrum antibiotics in patients improperly labeled as PCN allergic (Kleris & Lugar, 2019).   Anaphylaxis from cephalosporins is rare and the evidence suggests that there is no increased risk of an anaphylactic type reaction when cephalosporins are used in a PCN allergic patient (Pichichero, 2006).  Citations: Hermanides J, Lemkes BA, Prins Pearla Dubonnet MW, Terreehorst I. Presumed ?-Lactam Allergy and Cross-reactivity in the Operating Theater: A Practical Approach. Anesthesiology. 2018 Aug;129(2):335-342. doi: 10.1097/ALN.0000000000002252. PMID: 16010932.  Kleris, Rodessa., & Lugar, P. L. (2019). Things We Do For No Reason: Failing to Question a Penicillin Allergy History. Journal of hospital medicine, 14(10), 530-488-4591. Advance online publication. https://www.wallace-middleton.info/  Pichichero, M. E. (2006). Cephalosporins can be prescribed safely for penicillin-allergic patients. Journal of family medicine, 55(2), 106-112. Accessed: https://cdn.mdedge.com/files/s10fs-public/Document/September-2017/5502JFP_AppliedEvidence1.pdf   Honor Loh, MSN, APRN, FNP-C, CEN [facility]  Peri-operative Services Nurse Practitioner FAX: [phone] 06/20/20 3:21 PM

## 2020-06-21 ENCOUNTER — Encounter
Admission: RE | Admit: 2020-06-21 | Discharge: 2020-06-21 | Disposition: A | Discharge status: Home / Self Care | Payer: BC Managed Care – PPO | Source: Ambulatory Visit | Attending: Obstetrics and Gynecology | Admitting: Obstetrics and Gynecology

## 2020-06-21 DIAGNOSIS — N800 Endometriosis of uterus: Secondary | ICD-10-CM | POA: Diagnosis not present

## 2020-06-21 DIAGNOSIS — Z91040 Latex allergy status: Secondary | ICD-10-CM | POA: Diagnosis not present

## 2020-06-21 DIAGNOSIS — R102 Pelvic and perineal pain: Secondary | ICD-10-CM | POA: Diagnosis not present

## 2020-06-21 DIAGNOSIS — Z20822 Contact with and (suspected) exposure to covid-19: Secondary | ICD-10-CM | POA: Diagnosis not present

## 2020-06-21 DIAGNOSIS — Z882 Allergy status to sulfonamides status: Secondary | ICD-10-CM | POA: Diagnosis not present

## 2020-06-21 DIAGNOSIS — N950 Postmenopausal bleeding: Secondary | ICD-10-CM | POA: Diagnosis not present

## 2020-06-21 DIAGNOSIS — Z881 Allergy status to other antibiotic agents status: Secondary | ICD-10-CM | POA: Diagnosis not present

## 2020-06-21 DIAGNOSIS — D259 Leiomyoma of uterus, unspecified: Secondary | ICD-10-CM | POA: Diagnosis not present

## 2020-06-21 DIAGNOSIS — Z79899 Other long term (current) drug therapy: Secondary | ICD-10-CM | POA: Diagnosis not present

## 2020-06-21 DIAGNOSIS — Z87891 Personal history of nicotine dependence: Secondary | ICD-10-CM | POA: Diagnosis not present

## 2020-06-21 LAB — CBC
HCT: 43.1 % (ref 36.0–46.0)
Hemoglobin: 13.8 g/dL (ref 12.0–15.0)
MCH: 29.1 pg (ref 26.0–34.0)
MCHC: 32 g/dL (ref 30.0–36.0)
MCV: 90.9 fL (ref 80.0–100.0)
Platelets: 290 10*3/uL (ref 150–400)
RBC: 4.74 MIL/uL (ref 3.87–5.11)
RDW: 12.8 % (ref 11.5–15.5)
WBC: 9.5 10*3/uL (ref 4.0–10.5)
nRBC: 0 % (ref 0.0–0.2)

## 2020-06-21 LAB — TYPE AND SCREEN
ABO/RH(D): A POS
Antibody Screen: NEGATIVE

## 2020-06-21 LAB — SARS CORONAVIRUS 2 (TAT 6-24 HRS): SARS Coronavirus 2: NEGATIVE

## 2020-06-24 ENCOUNTER — Telehealth: Payer: Self-pay | Admitting: *Deleted

## 2020-06-24 ENCOUNTER — Encounter: Payer: Self-pay | Admitting: Family Medicine

## 2020-06-24 ENCOUNTER — Ambulatory Visit: Payer: BC Managed Care – PPO | Admitting: Obstetrics and Gynecology

## 2020-06-24 MED ORDER — GENTAMICIN SULFATE 40 MG/ML IJ SOLN
5.0000 mg/kg | INTRAVENOUS | Status: AC
  Administered 2020-06-25: 540 mg via INTRAVENOUS
  Filled 2020-06-24: qty 13.5

## 2020-06-24 MED ORDER — METRONIDAZOLE IN NACL 5-0.79 MG/ML-% IV SOLN
500.0000 mg | INTRAVENOUS | Status: AC
  Administered 2020-06-25: 500 mg via INTRAVENOUS
  Filled 2020-06-24: qty 100

## 2020-06-24 NOTE — Telephone Encounter (Signed)
Kim please check with Raquel Sarna if this is how its done and there should be a generic letter you can provide. Please print out letter after you check with Shawn.

## 2020-06-24 NOTE — Telephone Encounter (Signed)
Patient called and requested letter for her service dog.  She reported the dog is now ADA certified and she just wanted a "general" letter in case she needs it.  She also called to report she is having a hysterectomy 12/21.

## 2020-06-24 NOTE — Telephone Encounter (Signed)
That number is [phone]

## 2020-06-24 NOTE — Telephone Encounter (Signed)
There is a registration ID that she will get that tells [place] her dog is registered as a service dog , she will have to give [place] that information before any letter can be provided. Please talk to [facility] he can give you further information about this .

## 2020-06-25 ENCOUNTER — Other Ambulatory Visit: Payer: Self-pay

## 2020-06-25 ENCOUNTER — Ambulatory Visit: Payer: BC Managed Care – PPO | Admitting: Urgent Care

## 2020-06-25 ENCOUNTER — Ambulatory Visit
Admission: RE | Admit: 2020-06-25 | Discharge: 2020-06-25 | Disposition: A | Discharge status: Home / Self Care | Payer: BC Managed Care – PPO | Attending: Obstetrics and Gynecology | Admitting: Obstetrics and Gynecology

## 2020-06-25 ENCOUNTER — Encounter
Admission: RE | Disposition: A | Discharge status: Home / Self Care | Payer: Self-pay | Source: Home / Self Care | Attending: Obstetrics and Gynecology

## 2020-06-25 ENCOUNTER — Encounter: Payer: Self-pay | Admitting: Obstetrics and Gynecology

## 2020-06-25 DIAGNOSIS — D259 Leiomyoma of uterus, unspecified: Secondary | ICD-10-CM

## 2020-06-25 DIAGNOSIS — N800 Endometriosis of uterus: Secondary | ICD-10-CM | POA: Diagnosis not present

## 2020-06-25 DIAGNOSIS — R102 Pelvic and perineal pain: Secondary | ICD-10-CM

## 2020-06-25 DIAGNOSIS — N950 Postmenopausal bleeding: Secondary | ICD-10-CM | POA: Diagnosis not present

## 2020-06-25 DIAGNOSIS — F418 Other specified anxiety disorders: Secondary | ICD-10-CM | POA: Diagnosis not present

## 2020-06-25 DIAGNOSIS — K219 Gastro-esophageal reflux disease without esophagitis: Secondary | ICD-10-CM | POA: Diagnosis not present

## 2020-06-25 HISTORY — PX: TOTAL LAPAROSCOPIC HYSTERECTOMY WITH SALPINGECTOMY: SHX6742

## 2020-06-25 HISTORY — PX: CYSTOSCOPY: SHX5120

## 2020-06-25 LAB — POCT PREGNANCY, URINE: Preg Test, Ur: NEGATIVE

## 2020-06-25 SURGERY — HYSTERECTOMY, TOTAL, LAPAROSCOPIC, WITH SALPINGECTOMY
Anesthesia: General

## 2020-06-25 MED ORDER — OXYCODONE HCL 5 MG PO TABS: 5.0000 mg | ORAL_TABLET | Freq: Three times a day (TID) | ORAL | 0 refills | Status: DC | PRN

## 2020-06-25 MED ORDER — POVIDONE-IODINE 10 % EX SWAB: 2.0000 "application " | Freq: Once | CUTANEOUS | Status: DC

## 2020-06-25 MED ORDER — FENTANYL CITRATE (PF) 100 MCG/2ML IJ SOLN
INTRAMUSCULAR | Status: AC
  Filled 2020-06-25: qty 2

## 2020-06-25 MED ORDER — ESMOLOL HCL 100 MG/10ML IV SOLN
INTRAVENOUS | Status: AC
  Filled 2020-06-25: qty 10

## 2020-06-25 MED ORDER — DEXMEDETOMIDINE (PRECEDEX) IN NS 20 MCG/5ML (4 MCG/ML) IV SYRINGE
PREFILLED_SYRINGE | INTRAVENOUS | Status: DC | PRN
  Administered 2020-06-25: 8 ug via INTRAVENOUS
  Administered 2020-06-25: 12 ug via INTRAVENOUS

## 2020-06-25 MED ORDER — IBUPROFEN 600 MG PO TABS: 600.0000 mg | ORAL_TABLET | Freq: Four times a day (QID) | ORAL | 0 refills | Status: DC | PRN

## 2020-06-25 MED ORDER — ACETAMINOPHEN 10 MG/ML IV SOLN
INTRAVENOUS | Status: AC
  Filled 2020-06-25: qty 100

## 2020-06-25 MED ORDER — FENTANYL CITRATE (PF) 100 MCG/2ML IJ SOLN
INTRAMUSCULAR | Status: AC
  Administered 2020-06-25: 50 ug via INTRAVENOUS
  Filled 2020-06-25: qty 2

## 2020-06-25 MED ORDER — ACETAMINOPHEN 10 MG/ML IV SOLN
INTRAVENOUS | Status: DC | PRN
  Administered 2020-06-25: 1000 mg via INTRAVENOUS

## 2020-06-25 MED ORDER — SEVOFLURANE IN SOLN
RESPIRATORY_TRACT | Status: AC
  Filled 2020-06-25: qty 250

## 2020-06-25 MED ORDER — MIDAZOLAM HCL 2 MG/2ML IJ SOLN
INTRAMUSCULAR | Status: AC
  Filled 2020-06-25: qty 2

## 2020-06-25 MED ORDER — ACETAMINOPHEN 500 MG PO TABS: 1000.0000 mg | ORAL_TABLET | Freq: Four times a day (QID) | ORAL | 0 refills | Status: DC | PRN

## 2020-06-25 MED ORDER — ESMOLOL HCL 100 MG/10ML IV SOLN
INTRAVENOUS | Status: DC | PRN
  Administered 2020-06-25: 20 mg via INTRAVENOUS

## 2020-06-25 MED ORDER — FENTANYL CITRATE (PF) 100 MCG/2ML IJ SOLN
25.0000 ug | INTRAMUSCULAR | Status: DC | PRN
  Administered 2020-06-25: 50 ug via INTRAVENOUS

## 2020-06-25 MED ORDER — LACTATED RINGERS IV SOLN: INTRAVENOUS | Status: DC

## 2020-06-25 MED ORDER — LORAZEPAM 2 MG/ML IJ SOLN
INTRAMUSCULAR | Status: AC
  Administered 2020-06-25: 0.5 mg via INTRAVENOUS
  Filled 2020-06-25: qty 1

## 2020-06-25 MED ORDER — FENTANYL CITRATE (PF) 100 MCG/2ML IJ SOLN
INTRAMUSCULAR | Status: DC | PRN
  Administered 2020-06-25 (×3): 50 ug via INTRAVENOUS

## 2020-06-25 MED ORDER — CHLORHEXIDINE GLUCONATE 0.12 % MT SOLN
OROMUCOSAL | Status: AC
  Filled 2020-06-25: qty 15

## 2020-06-25 MED ORDER — ORAL CARE MOUTH RINSE: 15.0000 mL | Freq: Once | OROMUCOSAL | Status: DC

## 2020-06-25 MED ORDER — GLYCOPYRROLATE 0.2 MG/ML IJ SOLN
INTRAMUSCULAR | Status: DC | PRN
  Administered 2020-06-25: .2 mg via INTRAVENOUS

## 2020-06-25 MED ORDER — SODIUM CHLORIDE FLUSH 0.9 % IV SOLN
INTRAVENOUS | Status: AC
  Filled 2020-06-25: qty 10

## 2020-06-25 MED ORDER — OXYCODONE HCL 5 MG PO TABS
5.0000 mg | ORAL_TABLET | Freq: Three times a day (TID) | ORAL | Status: DC
Start: 2020-06-25 — End: 2020-06-25
  Administered 2020-06-25: 5 mg via ORAL

## 2020-06-25 MED ORDER — LIDOCAINE HCL (CARDIAC) PF 100 MG/5ML IV SOSY
PREFILLED_SYRINGE | INTRAVENOUS | Status: DC | PRN
  Administered 2020-06-25: 100 mg via INTRAVENOUS

## 2020-06-25 MED ORDER — KETOROLAC TROMETHAMINE 30 MG/ML IJ SOLN
INTRAMUSCULAR | Status: DC | PRN
  Administered 2020-06-25: 30 mg via INTRAVENOUS

## 2020-06-25 MED ORDER — ROCURONIUM BROMIDE 100 MG/10ML IV SOLN
INTRAVENOUS | Status: DC | PRN
  Administered 2020-06-25: 30 mg via INTRAVENOUS
  Administered 2020-06-25: 50 mg via INTRAVENOUS

## 2020-06-25 MED ORDER — BUPIVACAINE LIPOSOME 1.3 % IJ SUSP
INTRAMUSCULAR | Status: DC | PRN
  Administered 2020-06-25: 15 mL
  Administered 2020-06-25: 4 mL

## 2020-06-25 MED ORDER — PROPOFOL 10 MG/ML IV BOLUS
INTRAVENOUS | Status: DC | PRN
  Administered 2020-06-25: 200 mg via INTRAVENOUS

## 2020-06-25 MED ORDER — OXYCODONE HCL 5 MG PO TABS
ORAL_TABLET | ORAL | Status: AC
  Filled 2020-06-25: qty 1

## 2020-06-25 MED ORDER — ONDANSETRON HCL 4 MG/2ML IJ SOLN
INTRAMUSCULAR | Status: DC | PRN
  Administered 2020-06-25: 4 mg via INTRAVENOUS

## 2020-06-25 MED ORDER — SUGAMMADEX SODIUM 500 MG/5ML IV SOLN
INTRAVENOUS | Status: DC | PRN
  Administered 2020-06-25: 225 mg via INTRAVENOUS

## 2020-06-25 MED ORDER — HYDROMORPHONE HCL 1 MG/ML IJ SOLN
INTRAMUSCULAR | Status: AC
  Filled 2020-06-25: qty 1

## 2020-06-25 MED ORDER — MIDAZOLAM HCL 2 MG/2ML IJ SOLN
INTRAMUSCULAR | Status: DC | PRN
  Administered 2020-06-25: 2 mg via INTRAVENOUS

## 2020-06-25 MED ORDER — LORAZEPAM 2 MG/ML IJ SOLN: 0.5000 mg | Freq: Once | INTRAMUSCULAR | Status: AC

## 2020-06-25 MED ORDER — PHENYLEPHRINE HCL (PRESSORS) 10 MG/ML IV SOLN
INTRAVENOUS | Status: DC | PRN
  Administered 2020-06-25: 100 ug via INTRAVENOUS

## 2020-06-25 MED ORDER — DEXAMETHASONE SODIUM PHOSPHATE 10 MG/ML IJ SOLN
INTRAMUSCULAR | Status: DC | PRN
  Administered 2020-06-25: 10 mg via INTRAVENOUS

## 2020-06-25 MED ORDER — SODIUM CHLORIDE 0.9 % IV SOLN
INTRAVENOUS | Status: DC | PRN
  Administered 2020-06-25: 25 ug/min via INTRAVENOUS

## 2020-06-25 SURGICAL SUPPLY — 53 items
ADH SKN CLS APL DERMABOND .7 (GAUZE/BANDAGES/DRESSINGS) ×2
APL PRP STRL LF DISP 70% ISPRP (MISCELLANEOUS) ×2
BAG DRN RND TRDRP ANRFLXCHMBR (UROLOGICAL SUPPLIES) ×2
BAG URINE DRAIN 2000ML AR STRL (UROLOGICAL SUPPLIES) ×3 IMPLANT
BLADE SURG SZ11 CARB STEEL (BLADE) ×3 IMPLANT
CATH FOLEY 2WAY  5CC 16FR (CATHETERS) ×1
CATH FOLEY 2WAY 5CC 16FR (CATHETERS) ×2
CATH URTH 16FR FL 2W BLN LF (CATHETERS) ×2 IMPLANT
CHLORAPREP W/TINT 26 (MISCELLANEOUS) ×3 IMPLANT
DEFOGGER SCOPE WARMER CLEARIFY (MISCELLANEOUS) ×3 IMPLANT
DERMABOND ADVANCED (GAUZE/BANDAGES/DRESSINGS) ×1
DERMABOND ADVANCED .7 DNX12 (GAUZE/BANDAGES/DRESSINGS) ×2 IMPLANT
DEVICE SUTURE ENDOST 10MM (ENDOMECHANICALS) IMPLANT
DRAPE CAMERA CLOSED 9X96 (DRAPES) IMPLANT
DRSG TEGADERM 2-3/8X2-3/4 SM (GAUZE/BANDAGES/DRESSINGS) ×12 IMPLANT
GAUZE 4X4 16PLY RFD (DISPOSABLE) ×3 IMPLANT
GLOVE BIOGEL PI IND STRL 7.0 (GLOVE) ×12 IMPLANT
GLOVE BIOGEL PI INDICATOR 7.0 (GLOVE) ×6
GLOVE SURG SYN 7.0 (GLOVE) ×18 IMPLANT
GOWN STRL REUS W/ TWL LRG LVL3 (GOWN DISPOSABLE) ×12 IMPLANT
GOWN STRL REUS W/TWL LRG LVL3 (GOWN DISPOSABLE) ×18
GRASPER SUT TROCAR 14GX15 (MISCELLANEOUS) IMPLANT
IRRIGATION STRYKERFLOW (MISCELLANEOUS) ×2 IMPLANT
IRRIGATOR STRYKERFLOW (MISCELLANEOUS) ×3
IV LACTATED RINGERS 1000ML (IV SOLUTION) ×6 IMPLANT
KIT PINK PAD W/HEAD ARE REST (MISCELLANEOUS) ×3
KIT PINK PAD W/HEAD ARM REST (MISCELLANEOUS) ×2 IMPLANT
KIT TURNOVER CYSTO (KITS) ×3 IMPLANT
MANIFOLD NEPTUNE II (INSTRUMENTS) ×3 IMPLANT
MANIPULATOR VCARE LG CRV RETR (MISCELLANEOUS) IMPLANT
MANIPULATOR VCARE SML CRV RETR (MISCELLANEOUS) IMPLANT
MANIPULATOR VCARE STD CRV RETR (MISCELLANEOUS) ×3 IMPLANT
NS IRRIG 500ML POUR BTL (IV SOLUTION) ×3 IMPLANT
OCCLUDER COLPOPNEUMO (BALLOONS) ×3 IMPLANT
PACK GYN LAPAROSCOPIC (MISCELLANEOUS) ×3 IMPLANT
PAD OB MATERNITY 4.3X12.25 (PERSONAL CARE ITEMS) ×3 IMPLANT
PAD PREP 24X41 OB/GYN DISP (PERSONAL CARE ITEMS) ×3 IMPLANT
PORT ACCESS TROCAR AIRSEAL 5 (TROCAR) ×3 IMPLANT
SCISSORS METZENBAUM CVD 33 (INSTRUMENTS) IMPLANT
SET CYSTO W/LG BORE CLAMP LF (SET/KITS/TRAYS/PACK) ×3 IMPLANT
SET TRI-LUMEN FLTR TB AIRSEAL (TUBING) ×3 IMPLANT
SHEARS HARMONIC ACE PLUS 36CM (ENDOMECHANICALS) ×3 IMPLANT
SLEEVE ENDOPATH XCEL 5M (ENDOMECHANICALS) IMPLANT
SPONGE GAUZE 2X2 8PLY STRL LF (GAUZE/BANDAGES/DRESSINGS) ×12 IMPLANT
SUT ENDO VLOC 180-0-8IN (SUTURE) ×6 IMPLANT
SUT MNCRL 4-0 (SUTURE) ×3
SUT MNCRL 4-0 27XMFL (SUTURE) ×2
SUT VIC AB 0 CT1 36 (SUTURE) ×3 IMPLANT
SUTURE MNCRL 4-0 27XMF (SUTURE) ×2 IMPLANT
SYR 10ML LL (SYRINGE) ×3 IMPLANT
SYR 50ML LL SCALE MARK (SYRINGE) ×3 IMPLANT
TROCAR PORT AIRSEAL 8X100 (TROCAR) IMPLANT
TROCAR XCEL NON-BLD 5MMX100MML (ENDOMECHANICALS) ×3 IMPLANT

## 2020-06-25 NOTE — Interval H&P Note (Signed)
History and Physical Interval Note:  06/25/2020 10:18 AM  Kelly [name]  has presented today for surgery, with the diagnosis of pelvic pain postmenopausal bleeding.  The various methods of treatment have been discussed with the patient and family. After consideration of risks, benefits and other options for treatment, the patient has consented to  Procedure(s): TOTAL LAPAROSCOPIC HYSTERECTOMY WITH BILATERAL SALPINGECTOMY (Bilateral) as a surgical intervention.  The patient's history has been reviewed, patient examined, no change in status, stable for surgery.  I have reviewed the patient's chart and labs.  Questions were answered to the patient's satisfaction.     [place]

## 2020-06-25 NOTE — Telephone Encounter (Signed)
Medication management - message left for patient that Dr. Shea Evans had prepared a letter of support for her animal support dog and that it was being mailed to her at her address this date.

## 2020-06-25 NOTE — Anesthesia Preprocedure Evaluation (Signed)
Anesthesia Evaluation  Patient identified by MRN, date of birth, ID band Patient awake    Reviewed: Allergy & Precautions, H&P , NPO status , Patient's Chart, lab work & pertinent test results, reviewed documented beta blocker date and time   History of Anesthesia Complications Negative for: history of anesthetic complications  Airway Mallampati: II  TM Distance: >3 FB Neck ROM: full    Dental  (+) Dental Advidsory Given   Pulmonary neg pulmonary ROS, former smoker,    Pulmonary exam normal breath sounds clear to auscultation       Cardiovascular Exercise Tolerance: Good negative cardio ROS Normal cardiovascular exam Rhythm:regular Rate:Normal     Neuro/Psych PSYCHIATRIC DISORDERS Anxiety Depression negative neurological ROS     GI/Hepatic Neg liver ROS, GERD  ,  Endo/Other  neg diabetesMorbid obesity  Renal/GU Renal disease (kidney stones)  negative genitourinary   Musculoskeletal   Abdominal   Peds  Hematology negative hematology ROS (+)   Anesthesia Other Findings Past Medical History: No date: Allergy No date: Anxiety No date: Depression No date: GERD (gastroesophageal reflux disease) No date: History of kidney stones No date: Insomnia No date: Intermittent low back pain No date: Migraines No date: Osteoarthritis No date: Recurrent UTI No date: Restless leg syndrome No date: Sciatica of right side     Comment:  left side No date: Symptomatic menopausal or female climacteric states No date: Vertigo No date: Vitamin D deficiency   Reproductive/Obstetrics negative OB ROS                            Anesthesia Physical Anesthesia Plan  ASA: III  Anesthesia Plan: General   Post-op Pain Management:    Induction: Intravenous  PONV Risk Score and Plan: 3 and Ondansetron, Dexamethasone, Midazolam and Treatment may vary due to age or medical condition  Airway Management  Planned: Oral ETT  Additional Equipment:   Intra-op Plan:   Post-operative Plan: Extubation in OR  Informed Consent: I have reviewed the patients History and Physical, chart, labs and discussed the procedure including the risks, benefits and alternatives for the proposed anesthesia with the patient or authorized representative who has indicated his/her understanding and acceptance.     Dental Advisory Given  Plan Discussed with: Anesthesiologist, CRNA and Surgeon  Anesthesia Plan Comments:         Anesthesia Quick Evaluation

## 2020-06-25 NOTE — Op Note (Signed)
Operative Report:  PRE-OP DIAGNOSIS: pelvic pain postmenopausal bleeding   POST-OP DIAGNOSIS: pelvic pain postmenopausal bleeding   PROCEDURE: Procedure(s): TOTAL LAPAROSCOPIC HYSTERECTOMY WITH BILATERAL SALPINGECTOMY CYSTOSCOPY  SURGEON: Adrian Prows MD  ASSISTANT: Malachy Mood MD   ANESTHESIA: General endotracheal anesthesia  ESTIMATED BLOOD LOSS: 100 cc  SPECIMENS: Uterus, Fallopian tubes.  COMPLICATIONS: None  DISPOSITION: Stable to PACU  FINDINGS: Intraabdominal adhesions were not noted. Grossly normal uterus, fallopian tubes, bilateral ovaries. Small epiploic adhesion next to left ovary.   PROCEDURE:  . The patient was taken to the OR where anesthesia was administed. She was prepped and draped in the normal sterile fashion in the dorsal lithotomy position in the [place] stirrups. A time out was performed. A Graves speculum was inserted, the cervix was grasped with a single tooth tenaculum and the endometrial cavity was sounded. The cervix was progressively dilated to a size 18 [place] with [employer] dilators. A V-Care uterine manipulator was inserted in the usual fashion without incident. Gloves were changed and attention was turned to the abdomen.    A transverse 12mm skin incision was made with the scalpel after local anesthesia applied to the skin. A 5 mm trocar was inserted under direct visualization. pneumoperitoneum was obtained by insufflation of CO2 (opening pressure of 78mmHg) to 52mmHg. A diagnostic laparoscopy was performed yielding the previously described findings. An infraumbilical vertical 8 mm skin incision was made with the scalpel after local anesthesia applied to the skin. A 8 mm Airseal trocar was inserted under direct visualization.   Attention was turned to the left lower quadrant where after visualization of the inferior epigastric vessels a 93mm skin incision was made with the scalpel. A 5 mm laparoscopic port was inserted. Attention was turned to the  right lower quadrant where after visualization of the inferior epigastric vessels a 27mm skin incision was made with the scalpel. A 5 mm laparoscopic port was inserted.    . Attention was then turned to the right fallopian tube which was recognized by visualization of the fimbria. The tube is excised to its attachment to the uterus. Attention was turned to the right aspect of the uterus, where after visualization of the ureter, the round ligament was coagulated and transected using the 69mm Harmonic Scapel. The anterior and posterior leafs of the broad ligament were dissected off as the anterior one was coagulated and transected in a caudal direction towards the cuff of the uterine manipulator.   The uterine-ovarian ligament and its blood vessels were carefully coagulated and transected using the Harmonic scapel.  Attention was turned to the left aspect of the uterus where the same procedure was performed.  The vesicouterine reflection of the peritoneum was dissected with the harmonic scapel and the bladder flap was created bluntly.  The uterine vessels were coagulated and transected bilaterally using first bipolar cautery and then the harmonic scapel. A 360 degree, circumferential colpotomy was done to completely amputate the uterus with cervix and tubes. Once the specimen was amputated it was delivered through the vagina.   . The colpotomy was repaired in a simple running fashion using a delayed absorbable suture with an endo-stitch device.  Vaginal exam confirmed complete closure.  The cavity was copiously irrigated. A survey of the pelvic cavity revealed adequate hemostasis and no injury to bowel, bladder, or ureter.   . A diagnostic cystoscopy was performed using saline distension of bladder with no lesions or injuries noted.  Bilateral urine flow from each ureteral orifice was visualized.  . At this  point the procedure was finalized. umbilical fascia incision was closed with a vicryl suture using the  fascia closure device. All the instruments were removed from the patient's body. Gas was expelled and patient is leveled.  Incisions are closed with 4-0 monocryl and skin adhesive.    . Patient was taken to the recovery room in stable condition.  All sponge, instrument, and needle counts are correct x2.    Dr. Georgianne Fick assisted with this case. This was a high level case requiring a Physicist, medical. No other assistant was readily available. Dr. Georgianne Fick assisted with manipulation of the uterus, dissection and coagulation of tissue, and skin closure.  during the case.     Adrian Prows MD [facility], [facility] 06/25/2020 1:49 PM

## 2020-06-25 NOTE — Anesthesia Postprocedure Evaluation (Signed)
Anesthesia Post Note  Patient: Kelly [name]  Procedure(s) Performed: TOTAL LAPAROSCOPIC HYSTERECTOMY WITH BILATERAL SALPINGECTOMY (Bilateral ) CYSTOSCOPY (N/A )  Patient location during evaluation: PACU Anesthesia Type: General Level of consciousness: awake and alert Pain management: pain level controlled Vital Signs Assessment: post-procedure vital signs reviewed and stable Respiratory status: spontaneous breathing, nonlabored ventilation, respiratory function stable and patient connected to nasal cannula oxygen Cardiovascular status: blood pressure returned to baseline and stable Postop Assessment: no apparent nausea or vomiting Anesthetic complications: no   No complications documented.   Last Vitals:  Vitals:   06/25/20 1550 06/25/20 1608  BP: (!) 92/56 103/83  Pulse: (!) 105   Resp: 13 16  Temp:  36.9 C  SpO2: 95% 97%    Last Pain:  Vitals:   06/25/20 1608  TempSrc: Temporal  PainSc: 7                  Martha Clan

## 2020-06-25 NOTE — Discharge Instructions (Addendum)
Bupivacaine Liposomal Suspension for Injection What is this medicine? BUPIVACAINE LIPOSOMAL (bue PIV a kane LIP oh som al) is an anesthetic. It causes loss of feeling in the skin or other tissues. It is used to prevent and to treat pain from some procedures. This medicine may be used for other purposes; ask your health care provider or pharmacist if you have questions. COMMON BRAND NAME(S): EXPAREL What should I tell my health care provider before I take this medicine? They need to know if you have any of these conditions:  G6PD deficiency  heart disease  kidney disease  liver disease  low blood pressure  lung or breathing disease, like asthma  an unusual or allergic reaction to bupivacaine, other medicines, foods, dyes, or preservatives  pregnant or trying to get pregnant  breast-feeding How should I use this medicine? This medicine is for injection into the affected area. It is given by a health care professional in a hospital or clinic setting. Talk to your pediatrician regarding the use of this medicine in children. Special care may be needed. Overdosage: If you think you have taken too much of this medicine contact a poison control center or emergency room at once. NOTE: This medicine is only for you. Do not share this medicine with others. What if I miss a dose? This does not apply. What may interact with this medicine? This medicine may interact with the following medications:  acetaminophen  certain antibiotics like dapsone, nitrofurantoin, aminosalicylic acid, sulfonamides  certain medicines for seizures like phenobarbital, phenytoin, valproic acid  chloroquine  cyclophosphamide  flutamide  hydroxyurea  ifosfamide  metoclopramide  nitric oxide  nitroglycerin  nitroprusside  nitrous oxide  other local anesthetics like lidocaine, pramoxine, tetracaine  primaquine  quinine  rasburicase  sulfasalazine This list may not describe all possible  interactions. Give your health care provider a list of all the medicines, herbs, non-prescription drugs, or dietary supplements you use. Also tell them if you smoke, drink alcohol, or use illegal drugs. Some items may interact with your medicine. What should I watch for while using this medicine? Your condition will be monitored carefully while you are receiving this medicine. Be careful to avoid injury while the area is numb, and you are not aware of pain. What side effects may I notice from receiving this medicine? Side effects that you should report to your doctor or health care professional as soon as possible:  allergic reactions like skin rash, itching or hives, swelling of the face, lips, or tongue  seizures  signs and symptoms of a dangerous change in heartbeat or heart rhythm like chest pain; dizziness; fast, irregular heartbeat; palpitations; feeling faint or lightheaded; falls; breathing problems  signs and symptoms of methemoglobinemia such as pale, gray, or blue colored skin; headache; fast heartbeat; shortness of breath; feeling faint or lightheaded, falls; tiredness Side effects that usually do not require medical attention (report to your doctor or health care professional if they continue or are bothersome):  anxious  back pain  changes in taste  changes in vision  constipation  dizziness  fever  nausea, vomiting This list may not describe all possible side effects. Call your doctor for medical advice about side effects. You may report side effects to FDA at 1-800-FDA-1088. Where should I keep my medicine? This drug is given in a hospital or clinic and will not be stored at home. NOTE: This sheet is a summary. It may not cover all possible information. If you have questions about this  medicine, talk to your doctor, pharmacist, or health care provider.  2020 Elsevier/Gold Standard (2019-04-04 10:48:23)     AMBULATORY SURGERY  DISCHARGE  INSTRUCTIONS   1) The drugs that you were given will stay in your system until tomorrow so for the next 24 hours you should not:  A) Drive an automobile B) Make any legal decisions C) Drink any alcoholic beverage   2) You may resume regular meals tomorrow.  Today it is better to start with liquids and gradually work up to solid foods.  You may eat anything you prefer, but it is better to start with liquids, then soup and crackers, and gradually work up to solid foods.   3) Please notify your doctor immediately if you have any unusual bleeding, trouble breathing, redness and pain at the surgery site, drainage, fever, or pain not relieved by medication. 4)   5) Your post-operative visit with Dr.                                     is: Date:                        Time:    Please call to schedule your post-operative visit.  6) Additional Instructions:     Total Laparoscopic Hysterectomy, Care After This sheet gives you information about how to care for yourself after your procedure. Your health care provider may also give you more specific instructions. If you have problems or questions, contact your health care provider. What can I expect after the procedure? After the procedure, it is common to have:  Pain and bruising around your incisions.  A sore throat, if a breathing tube was used during surgery.  Fatigue.  Poor appetite.  Less interest in sex. If your ovaries were also removed, it is also common to have symptoms of menopause such as hot flashes, night sweats, and lack of sleep (insomnia). Follow these instructions at home: Bathing  Do not take baths, swim, or use a hot tub until your health care provider approves. You may need to only take showers for 2-3 weeks.  Keep your bandage (dressing) dry until your health care provider says it can be removed. Incision care   Follow instructions from your health care provider about how to take care of your incisions.  Make sure you: ? Wash your hands with soap and water before you change your dressing. If soap and water are not available, use hand sanitizer. ? Change your dressing as told by your health care provider. ? Leave stitches (sutures), skin glue, or adhesive strips in place. These skin closures may need to stay in place for 2 weeks or longer. If adhesive strip edges start to loosen and curl up, you may trim the loose edges. Do not remove adhesive strips completely unless your health care provider tells you to do that.  Check your incision area every day for signs of infection. Check for: ? Redness, swelling, or pain. ? Fluid or blood. ? Warmth. ? Pus or a bad smell. Activity  Get plenty of rest and sleep.  Do not lift anything that is heavier than 10 lbs (4.5 kg) for one month after surgery, or as long as told by your health care provider.  Do not drive or use heavy machinery while taking prescription pain medicine.  Do not drive for 24 hours if you were  given a medicine to help you relax (sedative).  Return to your normal activities as told by your health care provider. Ask your health care provider what activities are safe for you. Lifestyle   Do not use any products that contain nicotine or tobacco, such as cigarettes and e-cigarettes. These can delay healing. If you need help quitting, ask your health care provider.  Do not drink alcohol until your health care provider approves. General instructions  Do not douche, use tampons, or have sex for at least 6 weeks, or as told by your health care provider.  Take over-the-counter and prescription medicines only as told by your health care provider.  To monitor yourself for a fever, take your temperature at least once a day during recovery.  If you struggle with physical or emotional changes after your procedure, speak with your health care provider or a therapist.  To prevent or treat constipation while you are taking prescription pain  medicine, your health care provider may recommend that you: ? Drink enough fluid to keep your urine clear or pale yellow. ? Take over-the-counter or prescription medicines. ? Eat foods that are high in fiber, such as fresh fruits and vegetables, whole grains, and beans. ? Limit foods that are high in fat and processed sugars, such as fried and sweet foods.  Keep all follow-up visits as told by your health care provider. This is important. Contact a health care provider if:  You have chills or a fever.  You have redness, swelling, or pain around an incision.  You have fluid or blood coming from an incision.  Your incision feels warm to the touch.  You have pus or a bad smell coming from an incision.  An incision breaks open.  You feel dizzy or light-headed.  You have pain or bleeding when you urinate.  You have diarrhea, nausea, or vomiting that does not go away.  You have abnormal vaginal discharge.  You have a rash.  You have pain that does not get better with medicine. Get help right away if:  You have a fever and your symptoms suddenly get worse.  You have severe abdominal pain.  You have chest pain.  You have shortness of breath.  You faint.  You have pain, swelling, or redness on your leg.  You have heavy vaginal bleeding with blood clots. Summary  After the procedure it is common to have abdominal pain. Your provider will give you medication for this.  Do not take baths, swim, or use a hot tub until your health care provider approves.  Do not lift anything that is heavier than 10 lbs (4.5 kg) for one month after surgery, or as long as told by your health care provider.  Notify your provider if you have any signs or symptoms of infection after the procedure. This information is not intended to replace advice given to you by your health care provider. Make sure you discuss any questions you have with your health care provider. Document Revised: 06/04/2017  Document Reviewed: 09/02/2016 Elsevier Patient Education  2020 [employer].

## 2020-06-25 NOTE — Anesthesia Procedure Notes (Signed)
Procedure Name: Intubation Performed by: Kelton Pillar, CRNA Pre-anesthesia Checklist: Patient identified, Emergency Drugs available, Suction available and Patient being monitored Patient Re-evaluated:Patient Re-evaluated prior to induction Oxygen Delivery Method: Circle system utilized Preoxygenation: Pre-oxygenation with 100% oxygen Induction Type: IV induction Ventilation: Mask ventilation without difficulty Tube type: Oral Number of attempts: 1 Airway Equipment and Method: Stylet and Oral airway Placement Confirmation: ETT inserted through vocal cords under direct vision,  positive ETCO2 and breath sounds checked- equal and bilateral Tube secured with: Tape Dental Injury: Teeth and Oropharynx as per pre-operative assessment

## 2020-06-25 NOTE — Transfer of Care (Signed)
Immediate Anesthesia Transfer of Care Note  Patient: Kelly [name]  Procedure(s) Performed: TOTAL LAPAROSCOPIC HYSTERECTOMY WITH BILATERAL SALPINGECTOMY (Bilateral ) CYSTOSCOPY (N/A )  Patient Location: PACU  Anesthesia Type:General  Level of Consciousness: awake, drowsy and patient cooperative  Airway & Oxygen Therapy: Patient Spontanous Breathing  Post-op Assessment: Report given to RN and Post -op Vital signs reviewed and stable  Post vital signs: Reviewed and stable  Last Vitals:  Vitals Value Taken Time  BP    Temp    Pulse 99 06/25/20 1400  Resp 8 06/25/20 1400  SpO2 92 % 06/25/20 1400  Vitals shown include unvalidated device data.  Last Pain:  Vitals:   06/25/20 0956  TempSrc: Temporal  PainSc: 9       Patients Stated Pain Goal: 3 (81/15/72 6203)  Complications: No complications documented.

## 2020-06-26 ENCOUNTER — Encounter: Payer: Self-pay | Admitting: Obstetrics and Gynecology

## 2020-06-27 LAB — SURGICAL PATHOLOGY

## 2020-07-02 ENCOUNTER — Encounter: Payer: Self-pay | Admitting: Obstetrics and Gynecology

## 2020-07-02 ENCOUNTER — Ambulatory Visit (INDEPENDENT_AMBULATORY_CARE_PROVIDER_SITE_OTHER): Payer: BC Managed Care – PPO | Admitting: Obstetrics and Gynecology

## 2020-07-02 ENCOUNTER — Other Ambulatory Visit: Payer: Self-pay

## 2020-07-02 VITALS — BP 124/70 | Ht 64.0 in | Wt 229.8 lb

## 2020-07-02 DIAGNOSIS — Z90710 Acquired absence of both cervix and uterus: Secondary | ICD-10-CM

## 2020-07-02 NOTE — Progress Notes (Signed)
°  Postoperative Follow-up Patient presents post op from laparoscopic assisted vaginal hysterectomy for abnormal uterine bleeding, 1 week ago.  Subjective: Patient reports marked improvement in her preop symptoms. Eating a regular diet without difficulty. Pain is controlled without any medications.  Activity: normal activities of daily living. Patient reports additional symptom's since surgery of constipation.   Feels like the pain in her hips and pressure in her pelvis is resolved and she feels much better.   Objective: BP 124/70    Ht 5\' 4"  (1.626 m)    Wt 229 lb 12.8 oz (104.2 kg)    LMP 03/07/2020    BMI 39.45 kg/m  Physical Exam Constitutional:      Appearance: Normal appearance.  HENT:     Head: Normocephalic and atraumatic.  Eyes:     Pupils: Pupils are equal, round, and reactive to light.  Cardiovascular:     Rate and Rhythm: Normal rate.  Pulmonary:     Effort: Pulmonary effort is normal.     Breath sounds: Normal breath sounds.  Abdominal:     General: Abdomen is flat.     Palpations: Abdomen is soft.     Comments: Incisions are clean, dry, intact  Musculoskeletal:     Cervical back: Normal range of motion.  Neurological:     Mental Status: She is alert.  Skin:    General: Skin is warm and dry.  Psychiatric:        Behavior: Behavior normal.     Assessment: s/p :  total laparoscopic hysterectomy with bilateral salpingectomy stable  Plan: Patient has done well after surgery with no apparent complications.  I have discussed the post-operative course to date, and the expected progress moving forward.  The patient understands what complications to be concerned about.  I will see the patient in routine follow up, or sooner if needed.    Encouraged OTC treatment for constipation. Asked to call if no bowel movement by tomorrow.  She is having bothersome hot flashes. She would like to review medications for adjustments or consider treatment for vasomotor symptoms.    Activity plan: No heavy lifting. Pelvic rest.  Ziare Orrick R Charlee Whitebread 07/02/2020, 3:44 PM

## 2020-07-02 NOTE — Progress Notes (Signed)
Follow up after hysterotomy

## 2020-07-04 ENCOUNTER — Encounter: Payer: Self-pay | Admitting: Psychiatry

## 2020-07-04 ENCOUNTER — Other Ambulatory Visit: Payer: Self-pay

## 2020-07-04 ENCOUNTER — Telehealth (INDEPENDENT_AMBULATORY_CARE_PROVIDER_SITE_OTHER): Payer: BC Managed Care – PPO | Admitting: Psychiatry

## 2020-07-04 DIAGNOSIS — F3342 Major depressive disorder, recurrent, in full remission: Secondary | ICD-10-CM

## 2020-07-04 DIAGNOSIS — F411 Generalized anxiety disorder: Secondary | ICD-10-CM

## 2020-07-04 DIAGNOSIS — F5105 Insomnia due to other mental disorder: Secondary | ICD-10-CM | POA: Diagnosis not present

## 2020-07-04 DIAGNOSIS — F410 Panic disorder [episodic paroxysmal anxiety] without agoraphobia: Secondary | ICD-10-CM

## 2020-07-04 MED ORDER — MIRTAZAPINE 7.5 MG PO TABS: 7.5000 mg | ORAL_TABLET | Freq: Every day | ORAL | 0 refills | Status: DC

## 2020-07-04 MED ORDER — BUSPIRONE HCL 7.5 MG PO TABS: 7.5000 mg | ORAL_TABLET | Freq: Every day | ORAL | 0 refills | Status: DC

## 2020-07-04 MED ORDER — VENLAFAXINE HCL ER 150 MG PO CP24: 150.0000 mg | ORAL_CAPSULE | Freq: Every evening | ORAL | 0 refills | Status: DC

## 2020-07-04 MED ORDER — VENLAFAXINE HCL ER 75 MG PO CP24: 75.0000 mg | ORAL_CAPSULE | Freq: Every evening | ORAL | 0 refills | Status: DC

## 2020-07-04 NOTE — Progress Notes (Signed)
Virtual Visit via Video Note  I connected with Kelly [name] on 07/04/20 at 10:20 AM EST by [name] video enabled telemedicine application and verified that I am speaking with the correct person using two identifiers.  Location Provider Location : [facility] Patient Location : [place]  Participants: Patient , Provider   I discussed the limitations of evaluation and management by telemedicine and the availability of in person appointments. The patient expressed understanding and agreed to proceed.   I discussed the assessment and treatment plan with the patient. The patient was provided an opportunity to ask questions and all were answered. The patient agreed with the plan and demonstrated an understanding of the instructions.   The patient was advised to call back or seek an in-person evaluation if the symptoms worsen or if the condition fails to improve as anticipated.  Video connection was lost at less than 50% of the duration of the visit, at which time the remainder of the visit was completed through audio only   [facility] MD OP Progress Note  07/04/2020 6:00 PM Kelly [name]  MRN:  [id number]  Chief Complaint:  Chief Complaint    Follow-up     HPI: Kelly [name] 54 year old Caucasian female who is married, lives in [place], has [name] history of MDD, GAD, panic disorder, insomnia, gastroesophageal reflux disease, lumbar disc problem, migraine headaches was evaluated by telemedicine today.  Patient recently had her hysterectomy done and is currently recovering from the same. She continues to be in pain at times however reports she has good support system and is following up with her providers.  Patient reports her husband has been having some problems with his job as well as she is applying for new job positions and all this does make her anxious.  Patient otherwise denies any significant depressive symptoms.  She reports sleep as good.  Patient denies any suicidality,  homicidality or perceptual disturbances.  Patient continues to follow-up with her therapist.  She denies any other concerns today.  Visit Diagnosis:    ICD-10-CM   1. MDD (major depressive disorder), recurrent, in full remission ([place])  F33.42 venlafaxine XR (EFFEXOR-XR) 75 MG 24 hr capsule    venlafaxine XR (EFFEXOR-XR) 150 MG 24 hr capsule  2. GAD (generalized anxiety disorder)  F41.1 busPIRone (BUSPAR) 7.5 MG tablet    mirtazapine (REMERON) 7.5 MG tablet    venlafaxine XR (EFFEXOR-XR) 75 MG 24 hr capsule    venlafaxine XR (EFFEXOR-XR) 150 MG 24 hr capsule  3. Panic disorder  F41.0 busPIRone (BUSPAR) 7.5 MG tablet    mirtazapine (REMERON) 7.5 MG tablet    venlafaxine XR (EFFEXOR-XR) 75 MG 24 hr capsule    venlafaxine XR (EFFEXOR-XR) 150 MG 24 hr capsule  4. Insomnia due to mental condition  F51.05 mirtazapine (REMERON) 7.5 MG tablet    Past Psychiatric History: I have reviewed past psychiatric history from my progress note on 10/25/2018.  Past trials of medications like Paxil, trazodone, hydroxyzine.  Patient completed IOP on 10/03/2019  Past Medical History:  Past Medical History:  Diagnosis Date  . Allergy   . Anxiety   . Depression   . GERD (gastroesophageal reflux disease)   . History of kidney stones   . Insomnia   . Intermittent low back pain   . Migraines   . Osteoarthritis   . Recurrent UTI   . Restless leg syndrome   . Sciatica of right side    left side  . Symptomatic menopausal or  female climacteric states   . Vertigo   . Vitamin D deficiency     Past Surgical History:  Procedure Laterality Date  . BACK SURGERY    . COLONOSCOPY WITH PROPOFOL N/[name] 02/03/2019   Procedure: COLONOSCOPY WITH PROPOFOL;  Surgeon: Wyline Mood, MD;  Location: [facility] ENDOSCOPY;  Service: Gastroenterology;  Laterality: N/[name];  . COLONOSCOPY WITH PROPOFOL N/[name] 03/10/2019   Procedure: COLONOSCOPY WITH PROPOFOL;  Surgeon: Wyline Mood, MD;  Location: [facility] ENDOSCOPY;  Service: Gastroenterology;   Laterality: N/[name];  . CYSTOSCOPY N/[name] 06/25/2020   Procedure: CYSTOSCOPY;  Surgeon: Natale Milch, MD;  Location: [facility] ORS;  Service: Gynecology;  Laterality: N/[name];  . DIAGNOSTIC LAPAROSCOPY    . DILATION AND CURETTAGE OF UTERUS    . HYSTEROSCOPY WITH D & C N/[name] 05/29/2020   Procedure: DILATATION AND CURETTAGE /HYSTEROSCOPY;  Surgeon: Natale Milch, MD;  Location: [facility] ORS;  Service: Gynecology;  Laterality: N/[name];  . KNEE ARTHROSCOPY WITH MEDIAL MENISECTOMY Left 01/14/2017   Procedure: KNEE ARTHROSCOPY WITH MEDIAL AND LATERAL MENISECTOMY;  Surgeon: Kennedy Bucker, MD;  Location: [facility] ORS;  Service: Orthopedics;  Laterality: Left;  Partial Knee menisectomy  . KNEE SURGERY Left 05/2011   Dr. Rosita Kea- Arthroscopic  . LUMBAR LAMINECTOMY/DECOMPRESSION MICRODISCECTOMY N/[name] 09/16/2016   Procedure: LUMBAR LAMINECTOMY/DECOMPRESSION MICRODISCECTOMY 1 LEVEL L5-S1;  Surgeon: Venetia Night, MD;  Location: [facility] ORS;  Service: Neurosurgery;  Laterality: N/[name];  . TONSILLECTOMY AND ADENOIDECTOMY    . TOTAL LAPAROSCOPIC HYSTERECTOMY WITH SALPINGECTOMY Bilateral 06/25/2020   Procedure: TOTAL LAPAROSCOPIC HYSTERECTOMY WITH BILATERAL SALPINGECTOMY;  Surgeon: Natale Milch, MD;  Location: [facility] ORS;  Service: Gynecology;  Laterality: Bilateral;  . URETHRAL STRICTURE DILATATION    . WISDOM TOOTH EXTRACTION      Family Psychiatric History: I have reviewed family psychiatric history from my progress note on 10/25/2018  Family History:  Family History  Problem Relation Age of Onset  . Cancer Paternal Grandmother   . Anxiety disorder Cousin   . Depression Cousin     Social History: Reviewed social history from my progress note on 10/25/2018 Social History   Socioeconomic History  . Marital status: Married    Spouse name: Jonny Ruiz  . Number of children: 2  . Years of education: 103  . Highest education level: Associate degree: occupational, Scientist, product/process development, or vocational program  Occupational History  .  Occupation: not employed  Tobacco Use  . Smoking status: Former Smoker    Packs/day: 0.75    Years: 2.00    Pack years: 1.50    Types: Cigarettes    Start date: 07/06/1981    Quit date: 07/07/1983    Years since quitting: 37.0  . Smokeless tobacco: Never Used  Vaping Use  . Vaping Use: Never used  Substance and Sexual Activity  . Alcohol use: No    Alcohol/week: 0.0 standard drinks  . Drug use: No  . Sexual activity: Yes    Partners: Male  Other Topics Concern  . Not on file  Social History Narrative   Married and husband has [name] lot of medical problems, he is working again    International aid/development worker of [employer] investment [employer] Strain: Not on file  Food Insecurity: Not on file  Transportation Needs: Not on file  Physical Activity: Not on file  Stress: Not on file  Social Connections: Not on file    Allergies:  Allergies  Allergen Reactions  . Penicillins Anaphylaxis, Hives, Nausea And Vomiting and Swelling    Has patient had [name] PCN  reaction causing immediate rash, facial/tongue/throat swelling, SOB or lightheadedness with hypotension: Yes Has patient had [name] PCN reaction causing severe rash involving mucus membranes or skin necrosis: Yes Has patient had [name] PCN reaction that required hospitalization No Has patient had [name] PCN reaction occurring within the last 10 years: Yes If all of the above answers are "NO", then may proceed with Cephalosporin use.   . Chlorhexidine Gluconate Itching and Rash  . Ciprofloxacin Hives  . Clindamycin/Lincomycin Hives  . Erythromycin Hives and Nausea And Vomiting  . Keflex [Cephalexin] Hives  . Lyrica [Pregabalin]     Dizziness, syncope  . Nitrofurantoin Monohyd Macro Hives and Nausea And Vomiting  . Sulfa Antibiotics Hives and Nausea And Vomiting    Rapid heart rate  . Tetracyclines & Related Hives  . Adhesive [Tape] Rash  . Latex Rash  . Vancomycin Itching and Rash    Metabolic Disorder Labs: Lab Results  Component Value Date   HGBA1C  5.6 03/21/2020   MPG 114 03/21/2020   MPG 105 03/31/2019   No results found for: PROLACTIN Lab Results  Component Value Date   CHOL 179 03/21/2020   TRIG 146 03/21/2020   HDL 59 03/21/2020   CHOLHDL 3.0 03/21/2020   VLDL 22 12/31/2016   LDLCALC 95 03/21/2020   LDLCALC 82 06/17/2018   Lab Results  Component Value Date   TSH 1.75 03/31/2019   TSH 2.56 12/31/2016    Therapeutic Level Labs: No results found for: LITHIUM No results found for: VALPROATE No components found for:  CBMZ  Current Medications: Current Outpatient Medications  Medication Sig Dispense Refill  . acetaminophen (TYLENOL) 500 MG tablet Take 1,000 mg by mouth every 6 (six) hours as needed for headache or moderate pain.    Marland Kitchen acetaminophen (TYLENOL) 500 MG tablet Take 2 tablets (1,000 mg total) by mouth every 6 (six) hours as needed. 100 tablet 0  . busPIRone (BUSPAR) 7.5 MG tablet Take 1 tablet (7.5 mg total) by mouth daily. 90 tablet 0  . hydrOXYzine (ATARAX/VISTARIL) 25 MG tablet Take 25 mg by mouth every evening.    Marland Kitchen ibuprofen (ADVIL) 200 MG tablet Take 800 mg by mouth every 6 (six) hours as needed for headache or mild pain.    Marland Kitchen ibuprofen (ADVIL) 600 MG tablet Take 1 tablet (600 mg total) by mouth every 6 (six) hours as needed. 60 tablet 0  . mirtazapine (REMERON) 7.5 MG tablet Take 1 tablet (7.5 mg total) by mouth at bedtime. For sleep and anxiety 90 tablet 0  . omeprazole (PRILOSEC) 40 MG capsule Take 40 mg by mouth at bedtime.    Marland Kitchen oxyCODONE (ROXICODONE) 5 MG immediate release tablet Take 1 tablet (5 mg total) by mouth every 8 (eight) hours as needed. 20 tablet 0  . prazosin (MINIPRESS) 1 MG capsule Take 1 capsule (1 mg total) by mouth at bedtime. (Patient taking differently: Take 1 mg by mouth at bedtime as needed (nightmares/ptsd).) 90 capsule 0  . rOPINIRole (REQUIP) 1 MG tablet Take 1 tablet (1 mg total) by mouth at bedtime. 90 tablet 1  . SUMAtriptan (IMITREX) 100 MG tablet Take 1 tablet (100 mg  total) by mouth every 2 (two) hours as needed for migraine. May repeat in 2 hours if headache persists or recurs. 10 tablet 0  . topiramate (TOPAMAX) 100 MG tablet Take 1 tablet (100 mg total) by mouth every evening. 90 tablet 1  . venlafaxine XR (EFFEXOR-XR) 150 MG 24 hr capsule Take 1 capsule (150  mg total) by mouth every evening. Take with 75 mg to equal 225 mg daily 90 capsule 0  . venlafaxine XR (EFFEXOR-XR) 75 MG 24 hr capsule Take 1 capsule (75 mg total) by mouth every evening. Take with 150 mg to equal 225 mg daily 90 capsule 0   No current facility-administered medications for this visit.     Musculoskeletal: Strength & Muscle Tone: UTA Gait & Station: UTA Patient leans: N/[name]  Psychiatric Specialty Exam: Review of Systems  Psychiatric/Behavioral: The patient is nervous/anxious.   All other systems reviewed and are negative.   Last menstrual period 03/07/2020.There is no height or weight on file to calculate BMI.  General Appearance: UTA  Eye Contact:  UTA  Speech:  Clear and Coherent  Volume:  Normal  Mood:  Anxious  Affect:  UTA  Thought Process:  Goal Directed and Descriptions of Associations: Intact  Orientation:  Full (Time, Place, and Person)  Thought Content: Logical   Suicidal Thoughts:  No  Homicidal Thoughts:  No  Memory:  Immediate;   Fair Recent;   Fair Remote;   Fair  Judgement:  Fair  Insight:  Fair  Psychomotor Activity:  UTA  Concentration:  Concentration: Fair and Attention Span: Fair  Recall:  [employer] of Knowledge: Fair  Language: Fair  Akathisia:  No  Handed:  Right  AIMS (if indicated): UTA  Assets:  Communication Skills Desire for Improvement Housing Social Support  ADL's:  Intact  Cognition: WNL  Sleep:  Fair   Screenings: GAD-7   Flowsheet Row Office Visit from 05/27/2020 in [facility] Office Visit from 04/12/2020 in [facility] Office Visit from 10/12/2019 in [facility] Office  Visit from 06/20/2019 in [facility] Office Visit from 03/23/2019 in [facility]  Total GAD-7 Score 8 4 15 2 1     PHQ2-9   [place] Office Visit from 05/27/2020 in [facility] Procedure visit from 05/15/2020 in [place] Office Visit from 04/30/2020 in [place] [facility] Office Visit from 04/12/2020 in [facility] Office Visit from 03/21/2020 in [facility]  PHQ-2 Total Score 2 0 0 1 2  PHQ-9 Total Score 9 -- -- 4 --       Assessment and Plan: Kelly [name] is [name] 54 year old female who has [name] history of panic attacks, insomnia, MDD, arthritis, GAD was evaluated back telemedicine today.  Patient with psychosocial stressors of physical limitations due to pain, financial problems, relationship struggles, legal problems, recent surgery.  Patient however is currently making progress.  Plan as noted below.  Plan MDD-improving Venlafaxine 225 mg p.o. daily Mirtazapine 7.5 mg p.o. nightly  GAD-improving Venlafaxine as prescribed Mirtazapine 7.5 mg p.o. nightly BuSpar 7.5 mg p.o. daily Continue CBT  Panic attacks-improving BuSpar 7.5 mg p.o. daily-dose reduced due to side effects Hydroxyzine 25 mg p.o. twice daily as needed Continue CBT  Insomnia-improving Mirtazapine 7.5 mg p.o. nightly for sleep Prazosin 1 mg p.o. nightly as needed.  Patient reports she restarted taking it.  Follow-up in clinic in 1-2 months or sooner if needed.  I have spent atleast 20 minutes non face to face with patient today. More than 50 % of the time was spent for preparing to see the patient ( e.g., review of test, records ),  ordering medications and test ,psychoeducation and supportive psychotherapy and care coordination,as well as documenting clinical information  in electronic health record. This note was generated in part or whole  with voice recognition software. Voice recognition is usually quite accurate but there are transcription errors that can and very often do occur. I apologize for any typographical errors that were not detected and corrected.      Ursula Alert, MD 07/04/2020, 6:00 PM

## 2020-07-16 ENCOUNTER — Ambulatory Visit (INDEPENDENT_AMBULATORY_CARE_PROVIDER_SITE_OTHER): Payer: BC Managed Care – PPO | Admitting: Obstetrics and Gynecology

## 2020-07-16 ENCOUNTER — Other Ambulatory Visit: Payer: Self-pay

## 2020-07-16 ENCOUNTER — Encounter: Payer: Self-pay | Admitting: Obstetrics and Gynecology

## 2020-07-16 VITALS — Ht 64.0 in | Wt 229.0 lb

## 2020-07-16 DIAGNOSIS — Z90710 Acquired absence of both cervix and uterus: Secondary | ICD-10-CM

## 2020-07-16 NOTE — Progress Notes (Signed)
Post-op followup

## 2020-07-16 NOTE — Progress Notes (Signed)
Virtual Visit via Telephone Note  I connected with Kelly [name] on 07/16/20 at  1:50 PM EST by telephone and verified that I am speaking with the correct person using two identifiers.   I discussed the limitations, risks, security and privacy concerns of performing an evaluation and management service by telephone and the availability of in person appointments. I also discussed with the patient that there may be a patient responsible charge related to this service. The patient expressed understanding and agreed to proceed.  The patient was at home I spoke with the patient from my  office The names of people involved in this encounter were: Kelly [name] and Dr. Gilman Schmidt.   History of Present Illness: She has been feeling better. She reports constipation has resolved. She has had small amounts of vaginal spotting. She is active, but reminding herself to go slow. She feels overall a great improvement in her quality of like. She feels like she has a lot more energy for activities. She reports a mid fever of 100 F last week. She had a recent covid exposure so she did today's visit by the phone.    Observations/Objective:  Physical Exam could not be performed. Because of the COVID-19 outbreak this visit was performed over the phone and not in person.   Assessment and Plan: 55 yo s/p laparoscopic hysterectomy  Follow Up Instructions: Follow up in 5 weeks.    I discussed the assessment and treatment plan with the patient. The patient was provided an opportunity to ask questions and all were answered. The patient agreed with the plan and demonstrated an understanding of the instructions.   The patient was advised to call back or seek an in-person evaluation if the symptoms worsen or if the condition fails to improve as anticipated.  I provided 19 minutes of non-face-to-face time during this encounter.  Adrian Prows MD [facility], [facility] 07/16/2020 2:11  PM

## 2020-08-09 ENCOUNTER — Other Ambulatory Visit: Payer: Self-pay | Admitting: Family Medicine

## 2020-08-09 DIAGNOSIS — J3089 Other allergic rhinitis: Secondary | ICD-10-CM

## 2020-08-09 DIAGNOSIS — J302 Other seasonal allergic rhinitis: Secondary | ICD-10-CM

## 2020-08-14 ENCOUNTER — Telehealth: Payer: BC Managed Care – PPO | Admitting: Psychiatry

## 2020-08-15 ENCOUNTER — Encounter: Payer: Self-pay | Admitting: Student in an Organized Health Care Education/Training Program

## 2020-08-15 ENCOUNTER — Encounter: Payer: Self-pay | Admitting: Family Medicine

## 2020-08-20 ENCOUNTER — Ambulatory Visit (INDEPENDENT_AMBULATORY_CARE_PROVIDER_SITE_OTHER): Payer: Self-pay | Admitting: Obstetrics and Gynecology

## 2020-08-20 ENCOUNTER — Other Ambulatory Visit: Payer: Self-pay

## 2020-08-20 ENCOUNTER — Encounter: Payer: Self-pay | Admitting: Obstetrics and Gynecology

## 2020-08-20 VITALS — BP 136/74 | Ht 64.0 in | Wt 240.0 lb

## 2020-08-20 DIAGNOSIS — Z90710 Acquired absence of both cervix and uterus: Secondary | ICD-10-CM

## 2020-08-20 NOTE — Progress Notes (Signed)
  Kelly [name] presents post op from laparoscopic assisted vaginal hysterectomy for pelvic pain, 8 weeks ago.  Subjective: [name] reports marked improvement in her preop symptoms. Eating a regular diet without difficulty. Pain is controlled without any medications.  Activity: normal activities of daily living. [name] reports additional symptom's since surgery of None.  Objective: BP 136/74   Ht 5\' 4"  (1.626 m)   Wt 240 lb (108.9 kg)   LMP 03/07/2020   BMI 41.20 kg/m  Physical Exam Constitutional:      Appearance: Normal appearance.  Genitourinary:     Genitourinary Comments: External: Normal appearing vulva. No lesions noted.  Speculum examination: Normal appearing cuff. No blood in the vaginal vault. NO discharge.   Bimanual examination: intact, suture palpated  HENT:     Head: Normocephalic and atraumatic.     Nose: Nose normal.     Mouth/Throat:     Mouth: Mucous membranes are dry.     Pharynx: Oropharynx is clear.  Eyes:     Extraocular Movements: Extraocular movements intact.     Pupils: Pupils are equal, round, and reactive to light.  Cardiovascular:     Rate and Rhythm: Normal rate and regular rhythm.  Pulmonary:     Effort: Pulmonary effort is normal.     Breath sounds: Normal breath sounds.  Abdominal:     General: Abdomen is flat.     Palpations: Abdomen is soft.  Musculoskeletal:     Cervical back: Normal range of motion.  Neurological:     General: No focal deficit present.     Mental Status: She is alert and oriented to person, place, and time.  Skin:    General: Skin is warm.  Psychiatric:        Mood and Affect: Mood normal.        Behavior: Behavior normal.        Thought Content: Thought content normal.  Exam conducted with a chaperone present.     Assessment: s/p :  total laparoscopic hysterectomy with bilateral salpingectomy stable  Plan: [name] has done well after surgery with no apparent complications.  I have  discussed the post-operative course to date, and the expected progress moving forward.  The [name] understands what complications to be concerned about.  I will see the [name] in routine follow up, or sooner if needed.    Intact cuff- still palpating suture, continue pelvic rest Follow up in 4 weeks  Activity plan: No heavy lifting.  Pelvic rest.  [name] R [name] 08/20/2020, 1:43 PM

## 2020-09-03 ENCOUNTER — Encounter: Payer: Self-pay | Admitting: Student in an Organized Health Care Education/Training Program

## 2020-09-03 ENCOUNTER — Encounter: Payer: Self-pay | Admitting: Obstetrics and Gynecology

## 2020-09-03 NOTE — Telephone Encounter (Signed)
Letter sent via my chart

## 2020-09-19 ENCOUNTER — Ambulatory Visit (INDEPENDENT_AMBULATORY_CARE_PROVIDER_SITE_OTHER): Payer: Self-pay | Admitting: Obstetrics and Gynecology

## 2020-09-19 ENCOUNTER — Other Ambulatory Visit: Payer: Self-pay

## 2020-09-19 ENCOUNTER — Encounter: Payer: Self-pay | Admitting: Obstetrics and Gynecology

## 2020-09-19 VITALS — BP 120/70 | Ht 64.0 in | Wt 245.4 lb

## 2020-09-19 DIAGNOSIS — Z9889 Other specified postprocedural states: Secondary | ICD-10-CM

## 2020-09-19 DIAGNOSIS — Z90710 Acquired absence of both cervix and uterus: Secondary | ICD-10-CM

## 2020-09-19 NOTE — Progress Notes (Signed)
  Postoperative Follow-up Patient presents post op from [place] SALPINGECTOMY  for  Abnormal uterine bleeding , 3 months ago.  Subjective: Patient reports marked improvement in her preop symptoms. Eating a regular diet without difficulty. Pain is controlled without any medications.  Activity: normal activities of daily living. Patient reports additional symptom's since surgery of None.  She is waking up twice at night with hot flashes  Objective: BP 120/70   Ht 5\' 4"  (1.626 m)   Wt 245 lb 6.4 oz (111.3 kg)   LMP 03/07/2020   BMI 42.12 kg/m  Physical Exam Constitutional:      Appearance: She is well-developed.  Genitourinary:     Genitourinary Comments: External: Normal appearing vulva. No lesions noted.  Speculum examination: Normal appearing vaginal cuff. No blood in the vaginal vault.    HENT:     Head: Normocephalic and atraumatic.  Neck:     Thyroid: No thyromegaly.  Cardiovascular:     Rate and Rhythm: Normal rate and regular rhythm.     Heart sounds: Normal heart sounds.  Pulmonary:     Effort: Pulmonary effort is normal.     Breath sounds: Normal breath sounds.  Abdominal:     General: Bowel sounds are normal. There is no distension.     Palpations: Abdomen is soft. There is no mass.  Musculoskeletal:     Cervical back: Neck supple.  Neurological:     Mental Status: She is alert and oriented to person, place, and time.  Skin:    General: Skin is warm and dry.  Psychiatric:        Behavior: Behavior normal.        Thought Content: Thought content normal.        Judgment: Judgment normal.  Vitals reviewed.     Assessment: s/p :  TOTAL LAPAROSCOPIC HYSTERECTOMY WITH BILATERAL SALPINGECTOMY CYSTOSCOPY stable  Plan: Patient has done well after surgery with no apparent complications.  I have discussed the post-operative course to date, and the expected progress moving forward.  The patient understands what complications to  be concerned about.  I will see the patient in routine follow up, or sooner if needed.    Discussed options for hot flash management- she is waking up twice at night for hot flashes Will consider following up for [facility] and estradiol labs.  Will consider HRT.  Can not take brisdelle, on Effexor.   Activity plan: No restriction.  Adrian Prows MD, [place], [place] 09/19/2020 2:33 PM

## 2020-10-11 ENCOUNTER — Ambulatory Visit: Payer: BC Managed Care – PPO | Admitting: Family Medicine

## 2020-10-16 DIAGNOSIS — M25361 Other instability, right knee: Secondary | ICD-10-CM | POA: Diagnosis not present

## 2020-10-24 DIAGNOSIS — M25361 Other instability, right knee: Secondary | ICD-10-CM | POA: Diagnosis not present

## 2020-11-04 NOTE — Progress Notes (Signed)
Name: Kelly [name]   MRN: [id number]    DOB: [birth year]   Date:11/05/2020       Progress Note  Subjective  Chief Complaint  Medication Refill  HPI  MDD: she is still seeing Dr. Shea Evans, last visit 10/10/2019  She is on Effexor, hydroxizine, and Remeron added to help with sleep and minipress for nightmares She was admitted for depression from 09/19/2019 until 10/03/2019 . She feels medications is working, feeling much better than she used to     Migraine: Shestates since started Topamax she has not had any episodes of migraine, she has noticed pain is starting on nuchal area, associated withphotophobia, phonophobia, dizziness, and sometimes vomiting and scotomas.She has noticed episodes worse in the Spring and Fall and also with barometric changes. Imitrex works well for her   Metabolic Syndrome: she denies polyphagia, polydipsia or polyuria. A1C was5.6 %. Discussed weight loss medication such as Saxenda since she has pre-diabetes   Cervical DDD: she is going to see Dr. Holley Raring, she has daily aching sensation, she has radiculitis/tingling that goes down both arms.   GERD: under control with medicationat this time. She is taking Omeprazole daily, states symptoms have been controlled. No heartburn no indigestion as long as she takes medication   RLS: seems to be getting worse, she is worried that she may have circulatory problems , since she has noticed more lower extremity edema. Pain and movement is worse at night. Symptoms improves when walking, not claudication   Morbid obesity: BMI is above 40. Discussed importance of weight loss. She ha noticed decrease in exercise tolerance. She has Metabolic syndrome and GERD   SOB: she had a hysterectomy followed by right knee injury, she is wearing a brace and having PT , she gained 10 lbs in the past 6 months and has noticed SOB with mild activity, occasionally has wheezing, no cough, sometimes gets a little dizzy, she has mild orthopnea.  She has also noticed swelling of legs at the end of the day. 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  Tinnitus: bilaterally, going on for months, intermittent, discussed possible hearing loss and referral to ENT   Patient Active Problem List   Diagnosis Date Noted  . Pelvic pain   . Postmenopausal bleeding   . Chronic pain syndrome 12/14/2019  . Lumbar spondylosis 05/01/2019  . Cervicalgia 05/01/2019  . Encounter for screening colonoscopy   . Polyp of sigmoid colon   . GAD (generalized anxiety disorder) 02/28/2019  . Panic disorder 02/28/2019  . Insomnia due to mental condition 02/28/2019  . Morbid obesity ([place]) 07/26/2017  . Spondylolisthesis of lumbosacral region 02/11/2016  . Hematuria 04/09/2015  . Hyperglycemia 04/06/2015  . Right lumbar radiculitis 12/28/2014  . MDD (major depressive disorder), recurrent episode, moderate ([place]) 12/27/2014  . Gastro-esophageal reflux disease without esophagitis 12/27/2014  . Bulge of lumbar disc without myelopathy 12/27/2014  . Dysmetabolic syndrome 10/93/2355  . Migraine without aura and responsive to treatment 12/27/2014  . Osteoarthrosis 12/27/2014  . Obesity (BMI 30-39.9) 12/27/2014  . Restless leg 12/27/2014  . Allergic rhinitis, seasonal 12/27/2014    Past Surgical History:  Procedure Laterality Date  . BACK SURGERY    . COLONOSCOPY WITH PROPOFOL N/A 02/03/2019   Procedure: COLONOSCOPY WITH PROPOFOL;  Surgeon: Jonathon Bellows, MD;  Location: [facility]  ENDOSCOPY;  Service: Gastroenterology;  Laterality: N/A;  . COLONOSCOPY WITH PROPOFOL N/A 03/10/2019   Procedure: COLONOSCOPY WITH PROPOFOL;  Surgeon: Jonathon Bellows, MD;  Location: [facility] ENDOSCOPY;  Service: Gastroenterology;  Laterality: N/A;  . CYSTOSCOPY N/A 06/25/2020   Procedure: CYSTOSCOPY;  Surgeon: Homero Fellers, MD;   Location: [facility] ORS;  Service: Gynecology;  Laterality: N/A;  . DIAGNOSTIC LAPAROSCOPY    . DILATION AND CURETTAGE OF UTERUS    . HYSTEROSCOPY WITH D & C N/A 05/29/2020   Procedure: DILATATION AND CURETTAGE /HYSTEROSCOPY;  Surgeon: Homero Fellers, MD;  Location: [facility] ORS;  Service: Gynecology;  Laterality: N/A;  . KNEE ARTHROSCOPY WITH MEDIAL MENISECTOMY Left 01/14/2017   Procedure: KNEE ARTHROSCOPY WITH MEDIAL AND LATERAL MENISECTOMY;  Surgeon: Hessie Knows, MD;  Location: [facility] ORS;  Service: Orthopedics;  Laterality: Left;  Partial Knee menisectomy  . KNEE SURGERY Left 05/2011   Dr. Rudene Christians- Arthroscopic  . LUMBAR LAMINECTOMY/DECOMPRESSION MICRODISCECTOMY N/A 09/16/2016   Procedure: LUMBAR LAMINECTOMY/DECOMPRESSION MICRODISCECTOMY 1 LEVEL L5-S1;  Surgeon: Meade Maw, MD;  Location: [facility] ORS;  Service: Neurosurgery;  Laterality: N/A;  . TONSILLECTOMY AND ADENOIDECTOMY    . TOTAL LAPAROSCOPIC HYSTERECTOMY WITH SALPINGECTOMY Bilateral 06/25/2020   Procedure: TOTAL LAPAROSCOPIC HYSTERECTOMY WITH BILATERAL SALPINGECTOMY;  Surgeon: Homero Fellers, MD;  Location: [facility] ORS;  Service: Gynecology;  Laterality: Bilateral;  . URETHRAL STRICTURE DILATATION    . WISDOM TOOTH EXTRACTION      Family History  Problem Relation Age of Onset  . Cancer Paternal Grandmother   . Anxiety disorder Cousin   . Depression Cousin     Social History   Tobacco Use  . Smoking status: Former Smoker    Packs/day: 0.75    Years: 2.00    Pack years: 1.50    Types: Cigarettes    Start date: 07/06/1981    Quit date: 07/07/1983    Years since quitting: 37.3  . Smokeless tobacco: Never Used  Substance Use Topics  . Alcohol use: No    Alcohol/week: 0.0 standard drinks     Current Outpatient Medications:  .  busPIRone (BUSPAR) 7.5 MG tablet, Take 1 tablet (7.5 mg total) by mouth daily., Disp: 90 tablet, Rfl: 0 .  hydrOXYzine (ATARAX/VISTARIL) 25 MG tablet, Take 25 mg by mouth every evening., Disp: ,  Rfl:  .  mirtazapine (REMERON) 7.5 MG tablet, Take 1 tablet (7.5 mg total) by mouth at bedtime. For sleep and anxiety, Disp: 90 tablet, Rfl: 0 .  omeprazole (PRILOSEC) 40 MG capsule, Take 40 mg by mouth at bedtime., Disp: , Rfl:  .  prazosin (MINIPRESS) 1 MG capsule, Take 1 capsule (1 mg total) by mouth at bedtime. (Patient taking differently: Take 1 mg by mouth at bedtime as needed (nightmares/ptsd).), Disp: 90 capsule, Rfl: 0 .  rOPINIRole (REQUIP) 1 MG tablet, Take 1 tablet (1 mg total) by mouth at bedtime., Disp: 90 tablet, Rfl: 1 .  SUMAtriptan (IMITREX) 100 MG tablet, Take 1 tablet (100 mg total) by mouth every 2 (two) hours as needed for migraine. May repeat in 2 hours if headache persists or recurs., Disp: 10 tablet, Rfl: 0 .  topiramate (TOPAMAX) 100 MG tablet, Take 1 tablet (100 mg total) by mouth every evening., Disp: 90 tablet, Rfl: 1 .  venlafaxine XR (EFFEXOR-XR) 150 MG 24 hr capsule, Take 1 capsule (150 mg total) by mouth every evening. Take with 75 mg to equal 225 mg daily, Disp: 90 capsule, Rfl: 0 .  venlafaxine XR (EFFEXOR-XR) 75 MG 24 hr  capsule, Take 1 capsule (75 mg total) by mouth every evening. Take with 150 mg to equal 225 mg daily, Disp: 90 capsule, Rfl: 0  Allergies  Allergen Reactions  . Penicillins Anaphylaxis, Hives, Nausea And Vomiting and Swelling    Has patient had a PCN reaction causing immediate rash, facial/tongue/throat swelling, SOB or lightheadedness with hypotension: Yes Has patient had a PCN reaction causing severe rash involving mucus membranes or skin necrosis: Yes Has patient had a PCN reaction that required hospitalization No Has patient had a PCN reaction occurring within the last 10 years: Yes If all of the above answers are "NO", then may proceed with Cephalosporin use.   . Chlorhexidine Gluconate Itching and Rash  . Ciprofloxacin Hives  . Clindamycin/Lincomycin Hives  . Erythromycin Hives and Nausea And Vomiting  . Keflex [Cephalexin] Hives  .  Lyrica [Pregabalin]     Dizziness, syncope  . Nitrofurantoin Monohyd Macro Hives and Nausea And Vomiting  . Sulfa Antibiotics Hives and Nausea And Vomiting    Rapid heart rate  . Tetracyclines & Related Hives  . Adhesive [Tape] Rash  . Latex Rash  . Vancomycin Itching and Rash    I personally reviewed active problem list, medication list, allergies, family history, social history, health maintenance with the patient/caregiver today.   ROS  Constitutional: Negative for fever , positive for  weight change.  Respiratory: Negative for cough but has noticed shortness of breath.   Cardiovascular: Negative for chest pain or palpitations.  Gastrointestinal: Negative for abdominal pain, no bowel changes.  Musculoskeletal: Positive for gait problem and right knee  joint swelling.  Skin: Negative for rash.  Neurological: Negative for dizziness or headache.  No other specific complaints in a complete review of systems (except as listed in HPI above).  Objective  Vitals:   11/05/20 0830  BP: 124/80  Pulse: (!) 112  Resp: 17  Temp: 98.5 F (36.9 C)  TempSrc: Oral  SpO2: 99%  Weight: 246 lb (111.6 kg)  Height: 5\' 4"  (1.626 m)    Body mass index is 42.23 kg/m.  Physical Exam  Constitutional: Patient appears well-developed and well-nourished. Obese  No distress.  HEENT: head atraumatic, normocephalic, pupils equal and reactive to light, neck supple Cardiovascular: tachycardia , regular rhythm and normal heart sounds.  No murmur heard. No BLE edema.normal distal pulses  Pulmonary/Chest: Effort normal and breath sounds normal. No respiratory distress. Abdominal: Soft.  There is no tenderness. Psychiatric: Patient has a normal mood and affect. behavior is normal. Judgment and thought content normal.  PHQ2/9: Depression screen [facility] 2/9 11/05/2020 05/27/2020 05/15/2020 04/30/2020 04/12/2020  Decreased Interest 1 1 0 0 1  Down, Depressed, Hopeless 1 1 0 0 0  PHQ - 2 Score 2 2 0 0 1   Altered sleeping 1 2 - - 1  Tired, decreased energy 1 1 - - 1  Change in appetite 1 1 - - 1  Feeling bad or failure about yourself  0 2 - - 0  Trouble concentrating 0 1 - - 0  Moving slowly or fidgety/restless 0 0 - - 0  Suicidal thoughts 0 0 - - 0  PHQ-9 Score 5 9 - - 4  Difficult doing work/chores - - - - Somewhat difficult  Some recent data might be hidden    phq 9 is positive   Fall Risk: Fall Risk  11/05/2020 05/15/2020 05/15/2020 04/30/2020 04/12/2020  Falls in the past year? 1 0 0 1 1  Number falls in past yr:  0 - - 0 0  Comment - - - - -  Injury with Fall? 1 - - 0 0  Comment - - - - -  Risk for fall due to : - Medication side effect - - -  Risk for fall due to: Comment - - - - -  Follow up - - - - -    Functional Status Survey: Is the patient deaf or have difficulty hearing?: Yes Does the patient have difficulty seeing, even when wearing glasses/contacts?: No Does the patient have difficulty concentrating, remembering, or making decisions?: No Does the patient have difficulty walking or climbing stairs?: Yes Does the patient have difficulty dressing or bathing?: Yes Does the patient have difficulty doing errands alone such as visiting a doctor's office or shopping?: Yes    Assessment & Plan  1. Morbid obesity (HCC)  - Liraglutide -Weight Management (SAXENDA) 18 MG/3ML SOPN; Inject 0.6-3 mg into the skin daily.  Dispense: 15 mL; Refill: 1  2. Dysmetabolic syndrome   3. Migraine without aura and responsive to treatment  - topiramate (TOPAMAX) 100 MG tablet; Take 1 tablet (100 mg total) by mouth every evening.  Dispense: 90 tablet; Refill: 1 - SUMAtriptan (IMITREX) 100 MG tablet; Take 1 tablet (100 mg total) by mouth every 2 (two) hours as needed for migraine. May repeat in 2 hours if headache persists or recurs.  Dispense: 10 tablet; Refill: 0  4. Restless leg  - rOPINIRole (REQUIP) 1 MG tablet; Take 1 tablet (1 mg total) by mouth at bedtime.  Dispense: 90  tablet; Refill: 1  5. Breast cancer screening by mammogram  - MM 3D SCREEN BREAST BILATERAL; Future  6. Gastro-esophageal reflux disease without esophagitis  - omeprazole (PRILOSEC) 40 MG capsule; Take 1 capsule (40 mg total) by mouth at bedtime.  Dispense: 90 capsule; Refill: 1  7. Mild episode of recurrent major depressive disorder ([place])  Doing well at this time  8. GAD (generalized anxiety disorder)  Under the care of Dr. Shea Evans  9. Vitamin D deficiency  Continue supplementation   10. Primary osteoarthritis involving multiple joints   11. Hyperglycemia   12. Bilateral lower extremity edema  Normal exam today   13. SOB (shortness of breath) on exertion  It may be from deconditioning, discussed trying weight loss first, normal exam otherwise except for tachycardia - she states she did not take anxiety medications and is feeling anxious   14. H/O: hysterectomy

## 2020-11-05 ENCOUNTER — Other Ambulatory Visit: Payer: Self-pay

## 2020-11-05 ENCOUNTER — Ambulatory Visit: Payer: 59 | Admitting: Family Medicine

## 2020-11-05 ENCOUNTER — Encounter: Payer: Self-pay | Admitting: Family Medicine

## 2020-11-05 DIAGNOSIS — R739 Hyperglycemia, unspecified: Secondary | ICD-10-CM

## 2020-11-05 DIAGNOSIS — G2581 Restless legs syndrome: Secondary | ICD-10-CM | POA: Diagnosis not present

## 2020-11-05 DIAGNOSIS — Z1231 Encounter for screening mammogram for malignant neoplasm of breast: Secondary | ICD-10-CM | POA: Diagnosis not present

## 2020-11-05 DIAGNOSIS — E8881 Metabolic syndrome: Secondary | ICD-10-CM | POA: Diagnosis not present

## 2020-11-05 DIAGNOSIS — G43009 Migraine without aura, not intractable, without status migrainosus: Secondary | ICD-10-CM | POA: Diagnosis not present

## 2020-11-05 DIAGNOSIS — E559 Vitamin D deficiency, unspecified: Secondary | ICD-10-CM

## 2020-11-05 DIAGNOSIS — F411 Generalized anxiety disorder: Secondary | ICD-10-CM

## 2020-11-05 DIAGNOSIS — M159 Polyosteoarthritis, unspecified: Secondary | ICD-10-CM

## 2020-11-05 DIAGNOSIS — R000 Tachycardia, unspecified: Secondary | ICD-10-CM

## 2020-11-05 DIAGNOSIS — F330 Major depressive disorder, recurrent, mild: Secondary | ICD-10-CM

## 2020-11-05 DIAGNOSIS — R600 Localized edema: Secondary | ICD-10-CM

## 2020-11-05 DIAGNOSIS — M8949 Other hypertrophic osteoarthropathy, multiple sites: Secondary | ICD-10-CM

## 2020-11-05 DIAGNOSIS — M150 Primary generalized (osteo)arthritis: Secondary | ICD-10-CM

## 2020-11-05 DIAGNOSIS — R0602 Shortness of breath: Secondary | ICD-10-CM

## 2020-11-05 DIAGNOSIS — Z90710 Acquired absence of both cervix and uterus: Secondary | ICD-10-CM

## 2020-11-05 DIAGNOSIS — K219 Gastro-esophageal reflux disease without esophagitis: Secondary | ICD-10-CM

## 2020-11-05 MED ORDER — TOPIRAMATE 100 MG PO TABS: 100.0000 mg | ORAL_TABLET | Freq: Every evening | ORAL | 1 refills | Status: AC

## 2020-11-05 MED ORDER — SAXENDA 18 MG/3ML ~~LOC~~ SOPN: 0.6000 mg | PEN_INJECTOR | Freq: Every day | SUBCUTANEOUS | 1 refills | Status: DC

## 2020-11-05 MED ORDER — ROPINIROLE HCL 1 MG PO TABS: 1.0000 mg | ORAL_TABLET | Freq: Every day | ORAL | 1 refills | Status: DC

## 2020-11-05 MED ORDER — SUMATRIPTAN SUCCINATE 100 MG PO TABS: 100.0000 mg | ORAL_TABLET | ORAL | 0 refills | Status: AC | PRN

## 2020-11-05 MED ORDER — OMEPRAZOLE 40 MG PO CPDR: 40.0000 mg | DELAYED_RELEASE_CAPSULE | Freq: Every day | ORAL | 1 refills | Status: DC

## 2020-11-07 DIAGNOSIS — M25361 Other instability, right knee: Secondary | ICD-10-CM | POA: Diagnosis not present

## 2020-11-07 DIAGNOSIS — M25561 Pain in right knee: Secondary | ICD-10-CM | POA: Diagnosis not present

## 2020-11-11 ENCOUNTER — Telehealth: Payer: Self-pay

## 2020-11-11 NOTE — Telephone Encounter (Signed)
Pt calling for rx for UTI - has same sxs as before her hyst; is asking for the same antibx.  [phone]

## 2020-11-11 NOTE — Telephone Encounter (Signed)
Please schedule her for a nurse visit to evaluate for a UTI. If dip is suggestive of UTI then we can send a rx and a urine culture.

## 2020-11-11 NOTE — Telephone Encounter (Signed)
Called and left voicemail for patient to call back to be scheduled. 

## 2020-11-12 ENCOUNTER — Other Ambulatory Visit: Payer: Self-pay | Admitting: Obstetrics and Gynecology

## 2020-11-12 ENCOUNTER — Other Ambulatory Visit: Payer: Self-pay

## 2020-11-12 ENCOUNTER — Ambulatory Visit (INDEPENDENT_AMBULATORY_CARE_PROVIDER_SITE_OTHER): Payer: 59

## 2020-11-12 DIAGNOSIS — N3001 Acute cystitis with hematuria: Secondary | ICD-10-CM

## 2020-11-12 DIAGNOSIS — R102 Pelvic and perineal pain: Secondary | ICD-10-CM | POA: Diagnosis not present

## 2020-11-12 LAB — POCT URINALYSIS DIPSTICK
Bilirubin, UA: NEGATIVE
Glucose, UA: NEGATIVE
Ketones, UA: NEGATIVE
Nitrite, UA: NEGATIVE
Protein, UA: POSITIVE — AB
Spec Grav, UA: >=1.03 — AB (ref 1.010–1.025)
Urobilinogen, UA: NEGATIVE E.U./dL — AB
pH, UA: 5 (ref 5.0–8.0)

## 2020-11-12 MED ORDER — FOSFOMYCIN TROMETHAMINE 3 G PO PACK: 3.0000 g | PACK | Freq: Once | ORAL | 0 refills | Status: AC

## 2020-11-12 NOTE — Progress Notes (Signed)
Pt came to drop off a clean catch urine sample.

## 2020-11-12 NOTE — Progress Notes (Signed)
rx sent for suspected UTI

## 2020-11-12 NOTE — Telephone Encounter (Signed)
Patient is scheduled for nurse visit 11/12/20

## 2020-11-13 NOTE — Progress Notes (Signed)
Left msg that rx had been sent in.

## 2020-11-13 NOTE — Addendum Note (Signed)
Addended by: Cleophas Dunker D on: 11/13/2020 08:29 AM   Modules accepted: Orders

## 2020-11-13 NOTE — Progress Notes (Signed)
I sent her an antibiotic, please let her know

## 2020-11-14 DIAGNOSIS — M25361 Other instability, right knee: Secondary | ICD-10-CM | POA: Diagnosis not present

## 2020-11-14 DIAGNOSIS — M25561 Pain in right knee: Secondary | ICD-10-CM | POA: Diagnosis not present

## 2020-11-14 NOTE — Progress Notes (Signed)
Order(s) created erroneously. Erroneous order ID: [id number]  Order moved by: Pete Pelt  Order move date/time: 11/14/2020 2:11 PM  Source Patient: [id number]  Source Contact: 11/12/2020  Destination Patient: [id number]  Destination Contact: 11/12/2020

## 2020-11-19 LAB — URINE CULTURE

## 2020-11-26 DIAGNOSIS — M25361 Other instability, right knee: Secondary | ICD-10-CM | POA: Diagnosis not present

## 2020-11-28 DIAGNOSIS — M25361 Other instability, right knee: Secondary | ICD-10-CM | POA: Diagnosis not present

## 2020-12-04 ENCOUNTER — Encounter: Payer: Self-pay | Admitting: Family Medicine

## 2020-12-11 ENCOUNTER — Encounter: Payer: Self-pay | Admitting: Family Medicine

## 2020-12-19 DIAGNOSIS — F331 Major depressive disorder, recurrent, moderate: Secondary | ICD-10-CM | POA: Diagnosis not present

## 2020-12-19 DIAGNOSIS — F413 Other mixed anxiety disorders: Secondary | ICD-10-CM | POA: Diagnosis not present

## 2020-12-26 ENCOUNTER — Ambulatory Visit
Payer: 59 | Attending: Student in an Organized Health Care Education/Training Program | Admitting: Student in an Organized Health Care Education/Training Program

## 2020-12-26 ENCOUNTER — Other Ambulatory Visit: Payer: Self-pay

## 2020-12-26 ENCOUNTER — Encounter: Payer: Self-pay | Admitting: Student in an Organized Health Care Education/Training Program

## 2020-12-26 VITALS — BP 129/66 | HR 73 | Temp 97.3°F | Resp 16 | Ht 64.0 in | Wt 235.0 lb

## 2020-12-26 DIAGNOSIS — G894 Chronic pain syndrome: Secondary | ICD-10-CM | POA: Diagnosis not present

## 2020-12-26 DIAGNOSIS — G2581 Restless legs syndrome: Secondary | ICD-10-CM

## 2020-12-26 DIAGNOSIS — M4317 Spondylolisthesis, lumbosacral region: Secondary | ICD-10-CM

## 2020-12-26 DIAGNOSIS — M47816 Spondylosis without myelopathy or radiculopathy, lumbar region: Secondary | ICD-10-CM

## 2020-12-26 DIAGNOSIS — R000 Tachycardia, unspecified: Secondary | ICD-10-CM | POA: Diagnosis not present

## 2020-12-26 LAB — TSH: TSH: 15.6 mIU/L — ABNORMAL HIGH

## 2020-12-26 LAB — CBC WITH DIFFERENTIAL/PLATELET
Absolute Monocytes: 536 cells/uL (ref 200–950)
Basophils Absolute: 72 cells/uL (ref 0–200)
Basophils Relative: 0.9 %
Eosinophils Absolute: 608 cells/uL — ABNORMAL HIGH (ref 15–500)
Eosinophils Relative: 7.6 %
HCT: 46.7 % — ABNORMAL HIGH (ref 35.0–45.0)
Hemoglobin: 14.9 g/dL (ref 11.7–15.5)
Lymphs Abs: 2536 cells/uL (ref 850–3900)
MCH: 28 pg (ref 27.0–33.0)
MCHC: 31.9 g/dL — ABNORMAL LOW (ref 32.0–36.0)
MCV: 87.8 fL (ref 80.0–100.0)
MPV: 10.9 fL (ref 7.5–12.5)
Monocytes Relative: 6.7 %
Neutro Abs: 4248 cells/uL (ref 1500–7800)
Neutrophils Relative %: 53.1 %
Platelets: 309 10*3/uL (ref 140–400)
RBC: 5.32 10*6/uL — ABNORMAL HIGH (ref 3.80–5.10)
RDW: 14.2 % (ref 11.0–15.0)
Total Lymphocyte: 31.7 %
WBC: 8 10*3/uL (ref 3.8–10.8)

## 2020-12-26 LAB — COMPLETE METABOLIC PANEL WITH GFR
AG Ratio: 1.8 (calc) (ref 1.0–2.5)
ALT: 21 U/L (ref 6–29)
AST: 19 U/L (ref 10–35)
Albumin: 4.8 g/dL (ref 3.6–5.1)
Alkaline phosphatase (APISO): 78 U/L (ref 37–153)
BUN: 16 mg/dL (ref 7–25)
CO2: 23 mmol/L (ref 20–32)
Calcium: 10.1 mg/dL (ref 8.6–10.4)
Chloride: 108 mmol/L (ref 98–110)
Creat: 1.05 mg/dL (ref 0.50–1.05)
GFR, Est African American: 69 mL/min/{1.73_m2} (ref >=60)
GFR, Est Non African American: 60 mL/min/{1.73_m2} (ref >=60)
Globulin: 2.7 g/dL (calc) (ref 1.9–3.7)
Glucose, Bld: 102 mg/dL — ABNORMAL HIGH (ref 65–99)
Potassium: 4.2 mmol/L (ref 3.5–5.3)
Sodium: 142 mmol/L (ref 135–146)
Total Bilirubin: 0.6 mg/dL (ref 0.2–1.2)
Total Protein: 7.5 g/dL (ref 6.1–8.1)

## 2020-12-26 MED ORDER — GABAPENTIN 300 MG PO CAPS: 300.0000 mg | ORAL_CAPSULE | Freq: Every day | ORAL | 2 refills | Status: DC

## 2020-12-26 NOTE — Progress Notes (Signed)
Safety precautions to be maintained throughout the outpatient stay will include: orient to surroundings, keep bed in low position, maintain call bell within reach at all times, provide assistance with transfer out of bed and ambulation.  

## 2020-12-26 NOTE — Assessment & Plan Note (Signed)
Discussed weight loss and physical activity

## 2020-12-26 NOTE — Assessment & Plan Note (Signed)
Requested Prescriptions   Signed Prescriptions Disp Refills  . gabapentin (NEURONTIN) 300 MG capsule 60 capsule 2    Sig: Take 1-2 capsules (300-600 mg total) by mouth at bedtime.   Stop Requip

## 2020-12-26 NOTE — Assessment & Plan Note (Signed)
Repeat Lumbar facet MBB prn

## 2020-12-26 NOTE — Progress Notes (Signed)
PROVIDER NOTE: Information contained herein reflects review and annotations entered in association with encounter. Interpretation of such information and data should be left to medically-trained personnel. Information provided to patient can be located elsewhere in the medical record under "Patient Instructions". Document created using STT-dictation technology, any transcriptional errors that may result from process are unintentional.    Patient: Kelly [name]  Service Category: E/M  Provider: Gillis Santa, MD  DOB: 1965/08/18  DOS: 12/26/2020  Specialty: Interventional Pain Management  MRN: [id number]  Setting: Ambulatory outpatient  PCP: Steele Sizer, MD  Type: Established Patient    Referring Provider: Steele Sizer, MD  Location: Office  Delivery: Face-to-face     HPI  [name], a 55 y.o. year old female, is here today because of her Lumbar spondylosis [M47.816]. Ms. Matzen primary complain today is Hip Pain (left) and Knee Pain (right)  Pertinent problems: Ms. Fratto has MDD (major depressive disorder), recurrent episode, moderate ([place]); Obesity (BMI 30-39.9); Restless leg; Allergic rhinitis, seasonal; Spondylolisthesis of lumbosacral region; GAD (generalized anxiety disorder); Lumbar spondylosis; and Chronic pain syndrome on their pertinent problem list. Pain Assessment: Severity of Chronic pain is reported as a 2 /10. Location: Hip Left/radiates down left leg to toes and sometimes numbness to toes. Onset: More than a month ago. Quality: Aching, Discomfort, Numbness (stingy burn). Timing: Intermittent. Modifying factor(s): denies. Vitals:  height is _0  (1.626 m) and weight is 235 lb (106.6 kg). Her temperature is 97.3 F (36.3 C) (abnormal). Her blood pressure is 129/66 and her pulse is 73. Her respiration is 16 and oxygen saturation is 100%.   Reason for encounter:   Last visit with me was on 06/19/20. Since then she's had a hysterectomy which has helped with  the pressure in her low back. She has also had pain in her right knee as well as right knee instability with dislocation. She's had 2 knee dislocations since the New Year. She is working with [employer] regarding this. 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  ROS  Constitutional: Denies any fever or chills Gastrointestinal: No reported hemesis, hematochezia, vomiting, or acute GI distress Musculoskeletal:  left hip, low back, right knee pain  Neurological: No reported episodes of acute onset apraxia, aphasia, dysarthria, agnosia, amnesia, paralysis, loss of coordination, or loss of consciousness  Medication Review  Liraglutide -Weight Management, SUMAtriptan, busPIRone, gabapentin, hydrOXYzine, mirtazapine, omeprazole, prazosin, topiramate, and venlafaxine XR  History Review  Allergy: Ms. Goto is allergic to penicillins, chlorhexidine gluconate, ciprofloxacin, clindamycin/lincomycin, erythromycin, keflex [cephalexin], lyrica [pregabalin], nitrofurantoin monohyd macro, sulfa antibiotics, tetracyclines & related, adhesive [tape], latex, and vancomycin. Drug: Ms. Dimaria  reports no history of drug use. Alcohol:  reports no history of alcohol use. Tobacco:  reports that she quit smoking about 37 years ago. Her smoking use included cigarettes. She started smoking about 39 years ago. She has a 1.50 pack-year smoking history. She has never used smokeless tobacco. Social: Ms. Lamarca  reports that she quit smoking about 37 years ago. Her smoking use included cigarettes. She started smoking about 39 years ago. She has a 1.50 pack-year smoking history. She has never used smokeless tobacco. She reports that she does not drink alcohol and does not use drugs. Medical:  has a past medical history of Allergy, Anxiety, Depression, GERD (gastroesophageal reflux disease), History of kidney stones, Insomnia, Intermittent low back pain, Migraines, Osteoarthritis, Recurrent  UTI, Restless leg syndrome, Sciatica of right side, Symptomatic menopausal or female  climacteric states, Vertigo, and Vitamin D deficiency. Surgical: Ms. Cranmer  has a past surgical history that includes Tonsillectomy and adenoidectomy; Urethra dilation; Knee surgery (Left, 05/2011); Lumbar laminectomy/decompression microdiscectomy (N/A, 09/16/2016); Back surgery; Diagnostic laparoscopy; Dilation and curettage of uterus; Knee arthroscopy with medial menisectomy (Left, 01/14/2017); Colonoscopy with propofol (N/A, 02/03/2019); Colonoscopy with propofol (N/A, 03/10/2019); Hysteroscopy with D & C (N/A, 05/29/2020); Wisdom tooth extraction; Total laparoscopic hysterectomy with salpingectomy (Bilateral, 06/25/2020); and Cystoscopy (N/A, 06/25/2020). Family: family history includes Anxiety disorder in her cousin; Cancer in her paternal grandmother; Depression in her cousin.  Laboratory Chemistry Profile   Renal Lab Results  Component Value Date   BUN 14 05/22/2020   CREATININE 0.82 91/47/8295   BCR NOT APPLICABLE 62/13/0865   GFRAA 91 03/21/2020   GFRNONAA >60 05/22/2020     Hepatic Lab Results  Component Value Date   AST 18 05/22/2020   ALT 14 05/22/2020   ALBUMIN 4.1 05/22/2020   ALKPHOS 63 05/22/2020   LIPASE 23 05/22/2020     Electrolytes Lab Results  Component Value Date   NA 136 05/22/2020   K 3.8 05/22/2020   CL 108 05/22/2020   CALCIUM 8.9 05/22/2020     Bone Lab Results  Component Value Date   VD25OH 37 03/31/2019     Inflammation (CRP: Acute Phase) (ESR: Chronic Phase) No results found for: CRP, ESRSEDRATE, LATICACIDVEN     Note: Above Lab results reviewed.  Recent Imaging Review  US PELVIC COMPLETE W TRANSVAGINAL AND TORSION R/O CLINICAL DATA:  Pelvic pain, postmenopausal bleeding for 3 months  EXAM: TRANSABDOMINAL AND TRANSVAGINAL ULTRASOUND OF PELVIS  DOPPLER ULTRASOUND OF OVARIES  TECHNIQUE: Both transabdominal and transvaginal ultrasound examinations of  the pelvis were performed. Transabdominal technique was performed for global imaging of the pelvis including uterus, ovaries, adnexal regions, and pelvic cul-de-sac.  It was necessary to proceed with endovaginal exam following the transabdominal exam to visualize the endometrium. Color and duplex Doppler ultrasound was utilized to evaluate blood flow to the ovaries.  COMPARISON:  03/25/2020  FINDINGS: Uterus  Measurements: 11.4 x 5.7 x 6.8 cm = volume: 233 mL. Anteverted. Tiny nabothian cysts at cervix. Heterogeneous myometrium. No focal uterine mass.  Endometrium  Thickness: 8 mm.  No endometrial fluid or focal abnormality  Right ovary  Measurements: 2.0 x 2.4 x 1.9 cm = volume: None 4.7 mL. Seen only on transabdominal imaging, obscured on transvaginal imaging by bowel. Normal morphology without mass. Internal blood flow present on color Doppler imaging.  Left ovary  Measurements: 3.7 x 2.1 x 4.2 cm = volume: 17.1 mL. Cyst within LEFT ovary 3.6 x 3.3 x 2.0 cm, with minimal dependent debris. No mural nodularity or septations. No abnormal internal blood flow within the lesion. Blood flow present within LEFT ovary on color Doppler imaging.  Pulsed Doppler evaluation of both ovaries demonstrates normal low-resistance arterial and venous waveforms.  Other findings  No free pelvic fluid.  No adnexal masses.  IMPRESSION: Unremarkable uterus, endometrial complex and RIGHT ovary.  3.6 cm diameter cyst within LEFT ovary containing minimal dependent debris.  No evidence of ovarian torsion.  8 mm thick endometrial complex, abnormal for a postmenopausal patient with bleeding; in the setting of post-menopausal bleeding, endometrial sampling is indicated to exclude carcinoma. If results are benign, sonohysterogram should be considered for focal lesion work-up. (Ref: Radiological Reasoning: Algorithmic Workup of Abnormal Vaginal Bleeding with Endovaginal Sonography  and Sonohysterography. AJR 2008; 784:O96-29)  These results will be called to the ordering clinician or  representative by the Radiologist Assistant, and communication documented in the PACS or [employer].  Electronically Signed   By: Lavonia Dana M.D.   On: 05/28/2020 10:33 Note: Reviewed        Physical Exam  General appearance: Well nourished, well developed, and well hydrated. In no apparent acute distress Mental status: Alert, oriented x 3 (person, place, & time)       Respiratory: No evidence of acute respiratory distress Eyes: PERLA Vitals: BP 129/66   Pulse 73   Temp (!) 97.3 F (36.3 C)   Resp 16   Ht _0  (1.626 m)   Wt 235 lb (106.6 kg)   LMP 03/07/2020   SpO2 100%   BMI 40.34 kg/m  BMI: Estimated body mass index is 40.34 kg/m as calculated from the following:   Height as of this encounter: _1  (1.626 m).   Weight as of this encounter: 235 lb (106.6 kg). Ideal: Ideal body weight: 54.7 kg (120 lb 9.5 oz) Adjusted ideal body weight: 75.5 kg (166 lb 5.7 oz)  Assessment   Status Diagnosis  Controlled Controlled Controlled 1. Lumbar spondylosis   2. Spondylolisthesis of lumbosacral region   3. Restless leg   4. Chronic pain syndrome       Plan of Care  Problem-specific:  Lumbar spondylosis Repeat Lumbar facet MBB prn  Chronic pain syndrome Discussed weight loss and physical activity  Restless leg Requested Prescriptions   Signed Prescriptions Disp Refills   gabapentin (NEURONTIN) 300 MG capsule 60 capsule 2    Sig: Take 1-2 capsules (300-600 mg total) by mouth at bedtime.   Stop Requip    Follow-up plan:   Return in about 6 months (around 06/27/2021) for Medication Management, in person.    Recent Visits No visits were found meeting these conditions. Showing recent visits within past 90 days and meeting all other requirements Today's Visits Date Type Provider Dept  12/26/20 Office Visit Gillis Santa, MD [facility]   Showing today's visits and meeting all other requirements Future Appointments No visits were found meeting these conditions. Showing future appointments within next 90 days and meeting all other requirements I discussed the assessment and treatment plan with the patient. The patient was provided an opportunity to ask questions and all were answered. The patient agreed with the plan and demonstrated an understanding of the instructions.  Patient advised to call back or seek an in-person evaluation if the symptoms or condition worsens.  Duration of encounter: 54mnutes.  Note by: BGillis Santa MD Date: 12/26/2020; Time: 8:56 AM

## 2020-12-30 ENCOUNTER — Encounter: Payer: Self-pay | Admitting: Family Medicine

## 2020-12-30 ENCOUNTER — Other Ambulatory Visit: Payer: Self-pay | Admitting: Family Medicine

## 2020-12-30 DIAGNOSIS — R7989 Other specified abnormal findings of blood chemistry: Secondary | ICD-10-CM

## 2020-12-30 DIAGNOSIS — E039 Hypothyroidism, unspecified: Secondary | ICD-10-CM

## 2020-12-30 MED ORDER — LEVOTHYROXINE SODIUM 25 MCG PO TABS: 25.0000 ug | ORAL_TABLET | Freq: Every day | ORAL | 1 refills | Status: DC

## 2021-01-08 ENCOUNTER — Encounter: Payer: Self-pay | Admitting: Family Medicine

## 2021-01-14 ENCOUNTER — Ambulatory Visit: Payer: Self-pay | Admitting: *Deleted

## 2021-01-14 NOTE — Telephone Encounter (Signed)
Pt notified, answered all questions.

## 2021-01-14 NOTE — Telephone Encounter (Signed)
Reason for Disposition  [1] Caller has URGENT medicine question about med that PCP or specialist prescribed AND [2] triager unable to answer question    Saxenda 2.4 mg causing nausea during the night and has vomited in the morning on 2 separate occasions.  Answer Assessment - Initial Assessment Questions 1. NAME of MEDICATION: "What medicine are you calling about?"     Saxenda I think could be making me vomit.     I did not take the shot last night and this morning I feel fine. 2. QUESTION: "What is your question?" (e.g., double dose of medicine, side effect)     If I took my shot late in the evening and ate late I had indigestion and felt nausea when I increased my dosage. It hit me fast. Saxenda 2.4 mg now and I've vomited twice in the mornings.    I've even doubled up on my indigestion medication.    I'm belching a lot during the night too. I increased the dose of Saxenda 2.4 mg but I was backing up how early I was taking it.   I vomited after I first increased the dosage 2-3 days later.  I still got the rash around the injection site at 2.4 mg.    2-3 days later after the first time I vomited I vomited again.    After vomiting both times I felt better.  I did not do my shot last night and this morning I'm fine.     I'm not sure if the vomiting is from my thyroid medication too maybe.   I take it every morning on an empty stomach.    I really think it's from the [place] the nausea and vomiting.  I missed work yesterday and was a no show and I'm in trouble with work because I didn't go in yesterday.   So I got a lot to straighten out.  My TSH levels were high and she wants me to have my levels rechecked. 3. PRESCRIBING HCP: "Who prescribed it?" Reason: if prescribed by specialist, call should be referred to that group.     Dr. Ancil Boozer  4. SYMPTOMS: "Do you have any symptoms?"     Vomiting with nausea during the night.   5. SEVERITY: If symptoms are present, ask "Are they mild, moderate or  severe?"     Mild/moderate 6. PREGNANCY:  "Is there any chance that you are pregnant?" "When was your last menstrual period?"     Not asked due to age  Protocols used: Medication Question Call-A-AH

## 2021-01-14 NOTE — Telephone Encounter (Signed)
Pt called in c/o having side effects from the [place] after increasing her dose to 2.4 mg.   She did not take the Saxenda injection last night to see how she would feel this morning and she did not have nausea or vomiting and feels good this morning.  She didn't vomit until 2-3 days after increasing her dose.   She was nauseas all night and then vomited in the morning.   She felt better after vomiting.  Then 2-3 days after than episode she was nauseas all night and vomited in the morning.   This has happened twice.  That's why she did not take the injection last night to see if it made a difference.    She is taking the levothyroxine every morning on an empty stomach.  She doesn't think it's the thyroid medication making her nauseas and vomiting, but not sure.    She believes it's the Saxenda since she feels fine this morning after not taking it last night.  She also mentioned she thinks Dr. Ancil Boozer wants her to have her thyroid level rechecked since her dose was changed but she wasn't sure so was following up on that too.     I let her know I would pass this information along to Dr. Ancil Boozer and someone would call her back with further directions.   She was agreeable to this plan.    She can be reached at [phone].  I sent notes to [facility] for Dr. Steele Sizer.

## 2021-01-23 DIAGNOSIS — M25361 Other instability, right knee: Secondary | ICD-10-CM | POA: Diagnosis not present

## 2021-01-27 ENCOUNTER — Other Ambulatory Visit: Payer: Self-pay

## 2021-01-27 ENCOUNTER — Telehealth (INDEPENDENT_AMBULATORY_CARE_PROVIDER_SITE_OTHER): Payer: 59 | Admitting: Psychiatry

## 2021-01-27 DIAGNOSIS — F3342 Major depressive disorder, recurrent, in full remission: Secondary | ICD-10-CM

## 2021-01-27 NOTE — Progress Notes (Signed)
Patient had trouble connecting at the time of appointment.  Appointment rescheduled for August 8.

## 2021-02-06 NOTE — Progress Notes (Signed)
Name: Kelly [name]   MRN: [id number]    DOB: [birth year]   Date:02/07/2021       Progress Note  Subjective  Chief Complaint  Follow Up  HPI  MDD: she is still seeing Dr. Shea Evans, last visit 01/27/2021, but at the time patient was at hospice with her dying aunt and it was a short visit, she will see her again 02/19/2021, no change in medications. She is on Effexor, Hydroxizine, and Remeron added to help with sleep and minipress for nightmares She was admitted for depression from 09/19/2019 until 10/03/2019 . Phq 9 is stable. Keep follow up with psychiatrist      Migraine:  She states since started Topamax she has not had any episodes of migraine, she has noticed pain is starting on nuchal area, associated with photophobia, phonophobia, dizziness, and sometimes vomiting and scotomas. She  states doing better now, no episodes in over a month, she takes imitrex seldom now    Metabolic Syndrome: she denies polyphagia, polydipsia or polyuria. A1C was 5.6 %. She is now on Saxenda and has lost 29  lbs since started on medication , she had a little gap but has been able to maintain the weight even without medication.    Cervical DDD: she is going to see Dr. Holley Raring, she has daily aching sensation, she has radiculitis/tingling that goes down both arms. Stable    GERD: under control with medication at this time. She is taking Omeprazole daily, she skips occasionally but unable to go more than a few days without it.   RLS: she is on gabapentin now and states also losing weight has helped with symptoms   Morbid obesity: BMI is below 40 since started on Saxenda. She felt very nauseated in the beginning , hse was able to go up to 1.8 but caused vomiting and complete lack of appetite. Advised to stay on 1.2 mg if needed since she is respondnig well to therapy   SOB: she is feeling better since she lost weight  Knee instability: she is not going back to [employer] job, she is under the care of Dr. Holley Raring ( pain )  and Dr. Sharlet Salina ( Ortho)  Still has difficulty doing her laundry, leaning down, she is unable to do PT for her knee because it causes back pain . She is trying to get a different type of job   Tinnitus: bilaterally, going on for months, intermittent, discussed possible hearing loss and she is now wiling to see ENT  Hypothyroidism: her TSH was over 15, we started her on low dose levothyroxine and she is due for labs today   Patient Active Problem List   Diagnosis Date Noted   Pelvic pain    Postmenopausal bleeding    Chronic pain syndrome 12/14/2019   Lumbar spondylosis 05/01/2019   Cervicalgia 05/01/2019   Encounter for screening colonoscopy    Polyp of sigmoid colon    GAD (generalized anxiety disorder) 02/28/2019   Panic disorder 02/28/2019   Insomnia due to mental condition 02/28/2019   Morbid obesity ([employer]) 07/26/2017   Spondylolisthesis of lumbosacral region 02/11/2016   Hematuria 04/09/2015   Hyperglycemia 04/06/2015   Right lumbar radiculitis 12/28/2014   MDD (major depressive disorder), recurrent episode, moderate ([place]) 12/27/2014   Gastro-esophageal reflux disease without esophagitis 12/27/2014   Bulge of lumbar disc without myelopathy [id number]   Dysmetabolic syndrome [id number]   Migraine without aura and responsive to treatment 12/27/2014   Osteoarthrosis 12/27/2014   Obesity (BMI 30-39.9) 12/27/2014  Restless leg 12/27/2014   Allergic rhinitis, seasonal 12/27/2014    Past Surgical History:  Procedure Laterality Date   BACK SURGERY     COLONOSCOPY WITH PROPOFOL N/A 02/03/2019   Procedure: COLONOSCOPY WITH PROPOFOL;  Surgeon: Jonathon Bellows, MD;  Location: [facility] ENDOSCOPY;  Service: Gastroenterology;  Laterality: N/A;   COLONOSCOPY WITH PROPOFOL N/A 03/10/2019   Procedure: COLONOSCOPY WITH PROPOFOL;  Surgeon: Jonathon Bellows, MD;  Location[facility] ENDOSCOPY;  Service: Gastroenterology;  Laterality: N/A;   CYSTOSCOPY N/A 06/25/2020   Procedure: CYSTOSCOPY;  Surgeon: Homero Fellers, MD;  Location: [facility] ORS;  Service: Gynecology;  Laterality: N/A;   DIAGNOSTIC LAPAROSCOPY     DILATION AND CURETTAGE OF UTERUS     HYSTEROSCOPY WITH D & C N/A 05/29/2020   Procedure: DILATATION AND CURETTAGE /HYSTEROSCOPY;  Surgeon: Homero Fellers, MD;  Location: [facility] ORS;  Service: Gynecology;  Laterality: N/A;   KNEE ARTHROSCOPY WITH MEDIAL MENISECTOMY Left 01/14/2017   Procedure: KNEE ARTHROSCOPY WITH MEDIAL AND LATERAL MENISECTOMY;  Surgeon: Hessie Knows, MD;  Location: [facility] ORS;  Service: Orthopedics;  Laterality: Left;  Partial Knee menisectomy   KNEE SURGERY Left 05/2011   Dr. Rudene Christians- Arthroscopic   LUMBAR LAMINECTOMY/DECOMPRESSION MICRODISCECTOMY N/A 09/16/2016   Procedure: LUMBAR LAMINECTOMY/DECOMPRESSION MICRODISCECTOMY 1 LEVEL L5-S1;  Surgeon: Meade Maw, MD;  Location: [facility] ORS;  Service: Neurosurgery;  Laterality: N/A;   TONSILLECTOMY AND ADENOIDECTOMY     TOTAL LAPAROSCOPIC HYSTERECTOMY WITH SALPINGECTOMY Bilateral 06/25/2020   Procedure: TOTAL LAPAROSCOPIC HYSTERECTOMY WITH BILATERAL SALPINGECTOMY;  Surgeon: Homero Fellers, MD;  Location: [facility] ORS;  Service: Gynecology;  Laterality: Bilateral;   URETHRAL STRICTURE DILATATION     WISDOM TOOTH EXTRACTION      Family History  Problem Relation Age of Onset   Cancer Paternal Grandmother    Anxiety disorder Cousin    Depression Cousin     Social History   Tobacco Use   Smoking status: Former    Packs/day: 0.75    Years: 2.00    Pack years: 1.50    Types: Cigarettes    Start date: 07/06/1981    Quit date: 07/07/1983    Years since quitting: 37.6   Smokeless tobacco: Never  Substance Use Topics   Alcohol use: No    Alcohol/week: 0.0 standard drinks     Current Outpatient Medications:    busPIRone (BUSPAR) 7.5 MG tablet, Take 1 tablet (7.5 mg total) by mouth daily., Disp: 90 tablet, Rfl: 0   gabapentin (NEURONTIN) 300 MG capsule, Take 1-2 capsules (300-600 mg total) by mouth at bedtime.,  Disp: 60 capsule, Rfl: 2   hydrOXYzine (ATARAX/VISTARIL) 25 MG tablet, Take 25 mg by mouth every evening., Disp: , Rfl:    levothyroxine (SYNTHROID) 25 MCG tablet, Take 1 tablet (25 mcg total) by mouth daily., Disp: 30 tablet, Rfl: 1   Liraglutide -Weight Management (SAXENDA) 18 MG/3ML SOPN, Inject 0.6-3 mg into the skin daily., Disp: 15 mL, Rfl: 1   mirtazapine (REMERON) 7.5 MG tablet, Take 1 tablet (7.5 mg total) by mouth at bedtime. For sleep and anxiety, Disp: 90 tablet, Rfl: 0   omeprazole (PRILOSEC) 40 MG capsule, Take 1 capsule (40 mg total) by mouth at bedtime., Disp: 90 capsule, Rfl: 1   prazosin (MINIPRESS) 1 MG capsule, Take 1 capsule (1 mg total) by mouth at bedtime. (Patient taking differently: Take 1 mg by mouth at bedtime as needed (nightmares/ptsd).), Disp: 90 capsule, Rfl: 0   SUMAtriptan (IMITREX) 100 MG tablet, Take 1 tablet (100 mg total)  by mouth every 2 (two) hours as needed for migraine. May repeat in 2 hours if headache persists or recurs., Disp: 10 tablet, Rfl: 0   topiramate (TOPAMAX) 100 MG tablet, Take 1 tablet (100 mg total) by mouth every evening., Disp: 90 tablet, Rfl: 1   venlafaxine XR (EFFEXOR-XR) 150 MG 24 hr capsule, Take 1 capsule (150 mg total) by mouth every evening. Take with 75 mg to equal 225 mg daily, Disp: 90 capsule, Rfl: 0   venlafaxine XR (EFFEXOR-XR) 75 MG 24 hr capsule, Take 1 capsule (75 mg total) by mouth every evening. Take with 150 mg to equal 225 mg daily, Disp: 90 capsule, Rfl: 0  Allergies  Allergen Reactions   Penicillins Anaphylaxis, Hives, Nausea And Vomiting and Swelling    Has patient had a PCN reaction causing immediate rash, facial/tongue/throat swelling, SOB or lightheadedness with hypotension: Yes Has patient had a PCN reaction causing severe rash involving mucus membranes or skin necrosis: Yes Has patient had a PCN reaction that required hospitalization No Has patient had a PCN reaction occurring within the last 10 years: Yes If all  of the above answers are "NO", then may proceed with Cephalosporin use.    Chlorhexidine Gluconate Itching and Rash   Ciprofloxacin Hives   Clindamycin/Lincomycin Hives   Erythromycin Hives and Nausea And Vomiting   Keflex [Cephalexin] Hives   Lyrica [Pregabalin]     Dizziness, syncope   Nitrofurantoin Monohyd Macro Hives and Nausea And Vomiting   Sulfa Antibiotics Hives and Nausea And Vomiting    Rapid heart rate   Tetracyclines & Related Hives   Adhesive [Tape] Rash   Latex Rash   Vancomycin Itching and Rash    I personally reviewed active problem list, medication list, allergies, family history, social history, health maintenance with the patient/caregiver today.   ROS  Constitutional: Negative for fever, positive for  weight change.  Respiratory: Negative for cough and shortness of breath.   Cardiovascular: Negative for chest pain or palpitations.  Gastrointestinal: Negative for abdominal pain, no bowel changes.  Musculoskeletal: positive  for gait problem and intermittent right knee  joint swelling.  Skin: Negative for rash.  Neurological: Negative for dizziness or headache.  No other specific complaints in a complete review of systems (except as listed in HPI above).   Objective  Vitals:   02/07/21 0803  BP: 122/84  Pulse: 100  Resp: 16  Temp: 97.6 F (36.4 C)  SpO2: 98%  Weight: 223 lb (101.2 kg)  Height: '5\' 4"'$  (1.626 m)    Body mass index is 38.28 kg/m.  Physical Exam  Constitutional: Patient appears well-developed and well-nourished. Obese  No distress.  HEENT: head atraumatic, normocephalic, pupils equal and reactive to light,  neck supple Cardiovascular: Normal rate, regular rhythm and normal heart sounds.  No murmur heard. No BLE edema. Pulmonary/Chest: Effort normal and breath sounds normal. No respiratory distress. Abdominal: Soft.  There is no tenderness. Psychiatric: Patient has a normal mood and affect. behavior is normal. Judgment and thought  content normal.   Recent Results (from the past 2160 hour(s))  Urine Culture     Status: Abnormal   Collection Time: 11/12/20  4:35 PM   Specimen: Urine   UR  Result Value Ref Range   Urine Culture, Routine Final report (A)    Organism ID, Bacteria Escherichia coli (A)     Comment: Greater than 100,000 colony forming units per mL Cefazolin <=4 ug/mL Cefazolin with an MIC <=16 predicts susceptibility to the  oral agents cefaclor, cefdinir, cefpodoxime, cefprozil, cefuroxime, cephalexin, and loracarbef when used for therapy of uncomplicated urinary tract infections due to E. coli, Klebsiella pneumoniae, and Proteus mirabilis.    Antimicrobial Susceptibility Comment     Comment:       ** S = Susceptible; I = Intermediate; R = Resistant **                    P = Positive; N = Negative             MICS are expressed in micrograms per mL    Antibiotic                 RSLT#1    RSLT#2    RSLT#3    RSLT#4 Amoxicillin/Clavulanic Acid    S Ampicillin                     S Cefepime                       S Ceftriaxone                    S Cefuroxime                     S Ciprofloxacin                  S Ertapenem                      S Gentamicin                     S Imipenem                       S Levofloxacin                   S Meropenem                      S Nitrofurantoin                 S Piperacillin/Tazobactam        S Tetracycline                   S Tobramycin                     S Trimethoprim/Sulfa             S   POCT urinalysis dipstick     Status: Abnormal   Collection Time: 11/12/20  5:03 PM  Result Value Ref Range   Color, UA     Clarity, UA     Glucose, UA Negative Negative   Bilirubin, UA neg    Ketones, UA neg    Spec Grav, UA >=1.030 (A) 1.010 - 1.025   Blood, UA 2+    pH, UA 5.0 5.0 - 8.0   Protein, UA Positive (A) Negative    Comment: 1+   Urobilinogen, UA negative (A) 0.2 or 1.0 E.U./dL   Nitrite, UA neg    Leukocytes, UA Moderate (2+) (A) Negative    Appearance     Odor    TSH     Status: Abnormal   Collection Time: 12/26/20  8:53 AM  Result Value Ref Range   TSH 15.60 (H) mIU/L    Comment:  Reference Range .           > or = 20 Years  0.40-4.50 .                Pregnancy Ranges           First trimester    0.26-2.66           Second trimester   0.55-2.73           Third trimester    0.43-2.91   CBC with Differential/Platelet     Status: Abnormal   Collection Time: 12/26/20  8:53 AM  Result Value Ref Range   WBC 8.0 3.8 - 10.8 Thousand/uL   RBC 5.32 (H) 3.80 - 5.10 Million/uL   Hemoglobin 14.9 11.7 - 15.5 g/dL   HCT 46.7 (H) 35.0 - 45.0 %   MCV 87.8 80.0 - 100.0 fL   MCH 28.0 27.0 - 33.0 pg   MCHC 31.9 (L) 32.0 - 36.0 g/dL   RDW 14.2 11.0 - 15.0 %   Platelets 309 140 - 400 Thousand/uL   MPV 10.9 7.5 - 12.5 fL   Neutro Abs 4,248 1,500 - 7,800 cells/uL   Lymphs Abs 2,536 850 - 3,900 cells/uL   Absolute Monocytes 536 200 - 950 cells/uL   Eosinophils Absolute 608 (H) 15 - 500 cells/uL   Basophils Absolute 72 0 - 200 cells/uL   Neutrophils Relative % 53.1 %   Total Lymphocyte 31.7 %   Monocytes Relative 6.7 %   Eosinophils Relative 7.6 %   Basophils Relative 0.9 %  COMPLETE METABOLIC PANEL WITH GFR     Status: Abnormal   Collection Time: 12/26/20  8:53 AM  Result Value Ref Range   Glucose, Bld 102 (H) 65 - 99 mg/dL    Comment: .            Fasting reference interval . For someone without known diabetes, a glucose value between 100 and 125 mg/dL is consistent with prediabetes and should be confirmed with a follow-up test. .    BUN 16 7 - 25 mg/dL   Creat 1.05 0.50 - 1.05 mg/dL    Comment: For patients >70 years of age, the reference limit for Creatinine is approximately 13% higher for people identified as African-American. .    GFR, Est Non African American 60 > OR = 60 mL/min/1.66m   GFR, Est African American 69 > OR = 60 mL/min/1.752m  BUN/Creatinine Ratio NOT APPLICABLE 6 - 22 (calc)    Sodium 142 135 - 146 mmol/L   Potassium 4.2 3.5 - 5.3 mmol/L   Chloride 108 98 - 110 mmol/L   CO2 23 20 - 32 mmol/L   Calcium 10.1 8.6 - 10.4 mg/dL   Total Protein 7.5 6.1 - 8.1 g/dL   Albumin 4.8 3.6 - 5.1 g/dL   Globulin 2.7 1.9 - 3.7 g/dL (calc)   AG Ratio 1.8 1.0 - 2.5 (calc)   Total Bilirubin 0.6 0.2 - 1.2 mg/dL   Alkaline phosphatase (APISO) 78 37 - 153 U/L   AST 19 10 - 35 U/L   ALT 21 6 - 29 U/L    PH[facility] 02/07/2021 12/26/2020 11/05/2020 05/27/2020 05/15/2020  Decreased Interest 1 0 1 1 0  Down, Depressed, Hopeless 1 0 1 1 0  PHQ - 2 Score 2 0 2 2 0  Altered sleeping 0 - 1 2 -  Tired, decreased energy 3 - 1 1 -  Change in appetite  0 - 1 1 -  Feeling bad or failure about yourself  0 - 0 2 -  Trouble concentrating 0 - 0 1 -  Moving slowly or fidgety/restless 0 - 0 0 -  Suicidal thoughts 0 - 0 0 -  PHQ-9 Score 5 - 5 9 -  Difficult doing work/chores - - - - -  Some recent data might be hidden    phq 9 is positive   Fall Risk: Fall Risk  02/07/2021 12/26/2020 11/05/2020 05/15/2020 05/15/2020  Falls in the past year? '1 1 1 '$ 0 0  Number falls in past yr: 1 0 0 - -  Comment - - - - -  Injury with Fall? '1 1 1 '$ - -  Comment - injured right knee - - -  Risk for fall due to : - - - Medication side effect -  Risk for fall due to: Comment - - - - -  Follow up - - - - -      Functional Status Survey: Is the patient deaf or have difficulty hearing?: No Does the patient have difficulty seeing, even when wearing glasses/contacts?: No Does the patient have difficulty concentrating, remembering, or making decisions?: No Does the patient have difficulty walking or climbing stairs?: Yes Does the patient have difficulty dressing or bathing?: Yes Does the patient have difficulty doing errands alone such as visiting a doctor's office or shopping?: Yes    Assessment & Plan  1. Hypothyroidism, adult  Recheck labs today   2. Morbid obesity (HCC)  - Insulin  Pen Needle 32G X 6 MM MISC; 1 each by Does not apply route daily.  Dispense: 100 each; Refill: 1  3. GAD (generalized anxiety disorder)   4. Dysmetabolic syndrome   5. Migraine without aura and responsive to treatment   6. Gastro-esophageal reflux disease without esophagitis   7. Vitamin D deficiency  Continue vitamin D daily   8. Mild episode of recurrent major depressive disorder (HCC)   9. Elevated TSH  Recheck today   10. Restless leg   11. Tinnitus of both ears  - Ambulatory referral to ENT

## 2021-02-07 ENCOUNTER — Encounter: Payer: Self-pay | Admitting: Family Medicine

## 2021-02-07 ENCOUNTER — Other Ambulatory Visit: Payer: Self-pay

## 2021-02-07 ENCOUNTER — Ambulatory Visit (INDEPENDENT_AMBULATORY_CARE_PROVIDER_SITE_OTHER): Payer: Commercial Managed Care - PPO | Admitting: Family Medicine

## 2021-02-07 VITALS — BP 122/84 | HR 100 | Temp 97.6°F | Resp 16 | Ht 64.0 in | Wt 223.0 lb

## 2021-02-07 DIAGNOSIS — R7989 Other specified abnormal findings of blood chemistry: Secondary | ICD-10-CM

## 2021-02-07 DIAGNOSIS — F411 Generalized anxiety disorder: Secondary | ICD-10-CM | POA: Diagnosis not present

## 2021-02-07 DIAGNOSIS — G43009 Migraine without aura, not intractable, without status migrainosus: Secondary | ICD-10-CM

## 2021-02-07 DIAGNOSIS — E039 Hypothyroidism, unspecified: Secondary | ICD-10-CM | POA: Diagnosis not present

## 2021-02-07 DIAGNOSIS — E8881 Metabolic syndrome: Secondary | ICD-10-CM | POA: Diagnosis not present

## 2021-02-07 DIAGNOSIS — F330 Major depressive disorder, recurrent, mild: Secondary | ICD-10-CM

## 2021-02-07 DIAGNOSIS — E559 Vitamin D deficiency, unspecified: Secondary | ICD-10-CM

## 2021-02-07 DIAGNOSIS — G2581 Restless legs syndrome: Secondary | ICD-10-CM

## 2021-02-07 DIAGNOSIS — K219 Gastro-esophageal reflux disease without esophagitis: Secondary | ICD-10-CM

## 2021-02-07 DIAGNOSIS — H9313 Tinnitus, bilateral: Secondary | ICD-10-CM

## 2021-02-07 MED ORDER — INSULIN PEN NEEDLE 32G X 6 MM MISC: 1.0000 | Freq: Every day | 1 refills | Status: AC

## 2021-02-09 ENCOUNTER — Other Ambulatory Visit: Payer: Self-pay | Admitting: Family Medicine

## 2021-02-09 DIAGNOSIS — R7989 Other specified abnormal findings of blood chemistry: Secondary | ICD-10-CM

## 2021-02-09 MED ORDER — LEVOTHYROXINE SODIUM 25 MCG PO TABS: 25.0000 ug | ORAL_TABLET | Freq: Every day | ORAL | 1 refills | Status: DC

## 2021-02-10 ENCOUNTER — Other Ambulatory Visit: Payer: Self-pay

## 2021-02-10 ENCOUNTER — Encounter: Payer: Self-pay | Admitting: Psychiatry

## 2021-02-10 ENCOUNTER — Telehealth (INDEPENDENT_AMBULATORY_CARE_PROVIDER_SITE_OTHER): Payer: 59 | Admitting: Psychiatry

## 2021-02-10 DIAGNOSIS — Z634 Disappearance and death of family member: Secondary | ICD-10-CM | POA: Insufficient documentation

## 2021-02-10 DIAGNOSIS — F5105 Insomnia due to other mental disorder: Secondary | ICD-10-CM

## 2021-02-10 DIAGNOSIS — F3342 Major depressive disorder, recurrent, in full remission: Secondary | ICD-10-CM | POA: Insufficient documentation

## 2021-02-10 DIAGNOSIS — F411 Generalized anxiety disorder: Secondary | ICD-10-CM

## 2021-02-10 DIAGNOSIS — F410 Panic disorder [episodic paroxysmal anxiety] without agoraphobia: Secondary | ICD-10-CM

## 2021-02-10 LAB — THYROID PEROXIDASE ANTIBODY: Thyroperoxidase Ab SerPl-aCnc: 12 IU/mL — ABNORMAL HIGH (ref <9)

## 2021-02-10 LAB — TSH: TSH: 8.22 mIU/L — ABNORMAL HIGH

## 2021-02-10 MED ORDER — VENLAFAXINE HCL ER 150 MG PO CP24: 150.0000 mg | ORAL_CAPSULE | Freq: Every evening | ORAL | 0 refills | Status: DC

## 2021-02-10 MED ORDER — MIRTAZAPINE 7.5 MG PO TABS: 7.5000 mg | ORAL_TABLET | Freq: Every day | ORAL | 0 refills | Status: DC

## 2021-02-10 MED ORDER — PRAZOSIN HCL 1 MG PO CAPS: 1.0000 mg | ORAL_CAPSULE | Freq: Every day | ORAL | 0 refills | Status: DC

## 2021-02-10 MED ORDER — BUSPIRONE HCL 7.5 MG PO TABS: 7.5000 mg | ORAL_TABLET | Freq: Every day | ORAL | 0 refills | Status: DC

## 2021-02-10 MED ORDER — VENLAFAXINE HCL ER 75 MG PO CP24: 75.0000 mg | ORAL_CAPSULE | Freq: Every evening | ORAL | 0 refills | Status: DC

## 2021-02-10 NOTE — Progress Notes (Signed)
Virtual Visit via Video Note  I connected with Kelly [name] on 02/10/21 at 11:30 AM EDT by a video enabled telemedicine application and verified that I am speaking with the correct person using two identifiers.  Location Provider Location : [facility] Patient Location : Home  Participants: Patient , Provider    I discussed the limitations of evaluation and management by telemedicine and the availability of in person appointments. The patient expressed understanding and agreed to proceed.   I discussed the assessment and treatment plan with the patient. The patient was provided an opportunity to ask questions and all were answered. The patient agreed with the plan and demonstrated an understanding of the instructions.   The patient was advised to call back or seek an in-person evaluation if the symptoms worsen or if the condition fails to improve as anticipated.  Video connection was lost at less than 50% of the duration of the visit, at which time the remainder of the visit was completed through audio only    [facility] MD  OP Progress Note  02/10/2021 2:18 PM Kelly [name]  MRN:  [id number]  Chief Complaint:  Chief Complaint   Follow-up; Anxiety; Depression    HPI: Kelly [name] is a 55 year old Caucasian female who is married, lives in [place], has a history of MDD, GAD, panic disorder, insomnia, gastroesophageal reflux disease, lumbar disc problem, migraine headaches was evaluated by telemedicine today.  Patient today reports she is currently grieving the loss of her paternal aunt.  She passed away in Feb 04, 2023.  Patient reports she was close to this aunt and it has been hard for her.  Patient also reports she is currently trying to make a plan to take care of her father who got diagnosed with Alzheimer's dementia.  She reports it looks like Alzheimer's dementia runs in her family since her paternal aunt who passed away also had.  That makes her worry.  Patient reports she is  currently going through pain.  She reports she dislocated her knee again and although she had started working again she had to stop working due to her knee dislocation.  She is currently following up with her pain provider as well as orthopedic provider.  She reports she is currently grieving the loss of her aunt which does make her sad, does have concentration problems, restlessness, anxiety on and off.  She reports she however believes the venlafaxine and her other medications are helping her.  She restarted taking the prazosin at night which does help her.  She reports sleep is overall good.  She is currently on Saxenda for her weight loss.  She lost several pounds since being on that.  She however reports she wonders whether the Saxenda could be contributing to some depression.  She also is not sure if the sadness that she feels right now is due to everything else that is going on in her life.  She will however make an appointment with her primary care provider who is prescribing her at the [facility] to have a discussion.  She continues to follow-up with her therapist and reports therapy sessions are beneficial.  She denies any suicidality, homicidality or perceptual disturbances.  Patient denies any other concerns today.  Visit Diagnosis:    ICD-10-CM   1. MDD (major depressive disorder), recurrent, in full remission ([place])  F33.42 venlafaxine XR (EFFEXOR-XR) 75 MG 24 hr capsule    venlafaxine XR (EFFEXOR-XR) 150 MG 24 hr capsule    2. GAD (generalized anxiety  disorder)  F41.1 venlafaxine XR (EFFEXOR-XR) 75 MG 24 hr capsule    venlafaxine XR (EFFEXOR-XR) 150 MG 24 hr capsule    busPIRone (BUSPAR) 7.5 MG tablet    mirtazapine (REMERON) 7.5 MG tablet    3. Panic disorder  F41.0 venlafaxine XR (EFFEXOR-XR) 75 MG 24 hr capsule    venlafaxine XR (EFFEXOR-XR) 150 MG 24 hr capsule    busPIRone (BUSPAR) 7.5 MG tablet    mirtazapine (REMERON) 7.5 MG tablet    4. Insomnia due to mental condition   F51.05 mirtazapine (REMERON) 7.5 MG tablet    prazosin (MINIPRESS) 1 MG capsule   depression    5. Bereavement  Z63.4       Past Psychiatric History: Reviewed past psychiatric history from progress note on 10/25/2018.  Past trials of medications like Paxil, trazodone, hydroxyzine.  Patient completed IOP on 10/03/2019.  Past Medical History:  Past Medical History:  Diagnosis Date   Allergy    Anxiety    Depression    GERD (gastroesophageal reflux disease)    History of kidney stones    Insomnia    Intermittent low back pain    Migraines    Osteoarthritis    Recurrent UTI    Restless leg syndrome    Sciatica of right side    left side   Symptomatic menopausal or female climacteric states    Vertigo    Vitamin D deficiency     Past Surgical History:  Procedure Laterality Date   BACK SURGERY     COLONOSCOPY WITH PROPOFOL N/A 02/03/2019   Procedure: COLONOSCOPY WITH PROPOFOL;  Surgeon: Jonathon Bellows, MD;  Location: [facility] ENDOSCOPY;  Service: Gastroenterology;  Laterality: N/A;   COLONOSCOPY WITH PROPOFOL N/A 03/10/2019   Procedure: COLONOSCOPY WITH PROPOFOL;  Surgeon: Jonathon Bellows, MD;  Location: [facility] ENDOSCOPY;  Service: Gastroenterology;  Laterality: N/A;   CYSTOSCOPY N/A 06/25/2020   Procedure: CYSTOSCOPY;  Surgeon: Homero Fellers, MD;  Location: [facility] ORS;  Service: Gynecology;  Laterality: N/A;   DIAGNOSTIC LAPAROSCOPY     DILATION AND CURETTAGE OF UTERUS     HYSTEROSCOPY WITH D & C N/A 05/29/2020   Procedure: DILATATION AND CURETTAGE /HYSTEROSCOPY;  Surgeon: Homero Fellers, MD;  Location: [facility] ORS;  Service: Gynecology;  Laterality: N/A;   KNEE ARTHROSCOPY WITH MEDIAL MENISECTOMY Left 01/14/2017   Procedure: KNEE ARTHROSCOPY WITH MEDIAL AND LATERAL MENISECTOMY;  Surgeon: Hessie Knows, MD;  Location: [facility] ORS;  Service: Orthopedics;  Laterality: Left;  Partial Knee menisectomy   KNEE SURGERY Left 05/2011   Dr. Rudene Christians- Arthroscopic   LUMBAR LAMINECTOMY/DECOMPRESSION  MICRODISCECTOMY N/A 09/16/2016   Procedure: LUMBAR LAMINECTOMY/DECOMPRESSION MICRODISCECTOMY 1 LEVEL L5-S1;  Surgeon: Meade Maw, MD;  Location: [facility] ORS;  Service: Neurosurgery;  Laterality: N/A;   TONSILLECTOMY AND ADENOIDECTOMY     TOTAL LAPAROSCOPIC HYSTERECTOMY WITH SALPINGECTOMY Bilateral 06/25/2020   Procedure: TOTAL LAPAROSCOPIC HYSTERECTOMY WITH BILATERAL SALPINGECTOMY;  Surgeon: Homero Fellers, MD;  Location: [facility] ORS;  Service: Gynecology;  Laterality: Bilateral;   URETHRAL STRICTURE DILATATION     WISDOM TOOTH EXTRACTION      Family Psychiatric History: Reviewed family psychiatric history from progress note on 10/25/2018  Family History:  Family History  Problem Relation Age of Onset   Dementia Father    Dementia Paternal Aunt    Cancer Paternal Grandmother    Anxiety disorder Cousin    Depression Cousin     Social History: Reviewed social history from progress note on 10/25/2018 Social History   Socioeconomic History  Marital status: Married    Spouse name: John   Number of children: 2   Years of education: 14   Highest education level: Associate degree: occupational, Hotel manager, or vocational program  Occupational History   Occupation: not employed  Tobacco Use   Smoking status: Former    Packs/day: 0.75    Years: 2.00    Pack years: 1.50    Types: Cigarettes    Start date: 07/06/1981    Quit date: 07/07/1983    Years since quitting: 37.6   Smokeless tobacco: Never  Vaping Use   Vaping Use: Never used  Substance and Sexual Activity   Alcohol use: No    Alcohol/week: 0.0 standard drinks   Drug use: No   Sexual activity: Yes    Partners: Male  Other Topics Concern   Not on file  Social History Narrative   Married and husband has a lot of medical problems, he is working again    Investment banker, operational of Radio broadcast assistant Strain: Not on file  Food Insecurity: Not on file  Transportation Needs: Not on file  Physical Activity: Not on  file  Stress: Not on file  Social Connections: Not on file    Allergies:  Allergies  Allergen Reactions   Penicillins Anaphylaxis, Hives, Nausea And Vomiting and Swelling    Has patient had a PCN reaction causing immediate rash, facial/tongue/throat swelling, SOB or lightheadedness with hypotension: Yes Has patient had a PCN reaction causing severe rash involving mucus membranes or skin necrosis: Yes Has patient had a PCN reaction that required hospitalization No Has patient had a PCN reaction occurring within the last 10 years: Yes If all of the above answers are "NO", then may proceed with Cephalosporin use.    Chlorhexidine Gluconate Itching and Rash   Ciprofloxacin Hives   Clindamycin/Lincomycin Hives   Erythromycin Hives and Nausea And Vomiting   Keflex [Cephalexin] Hives   Lyrica [Pregabalin]     Dizziness, syncope   Nitrofurantoin Monohyd Macro Hives and Nausea And Vomiting   Sulfa Antibiotics Hives and Nausea And Vomiting    Rapid heart rate   Tetracyclines & Related Hives   Adhesive [Tape] Rash   Latex Rash   Vancomycin Itching and Rash    Metabolic Disorder Labs: Lab Results  Component Value Date   HGBA1C 5.6 03/21/2020   MPG 114 03/21/2020   MPG 105 03/31/2019   No results found for: PROLACTIN Lab Results  Component Value Date   CHOL 179 03/21/2020   TRIG 146 03/21/2020   HDL 59 03/21/2020   CHOLHDL 3.0 03/21/2020   VLDL 22 12/31/2016   LDLCALC 95 03/21/2020   LDLCALC 82 06/17/2018   Lab Results  Component Value Date   TSH 8.22 (H) 02/07/2021   TSH 15.60 (H) 12/26/2020    Therapeutic Level Labs: No results found for: LITHIUM No results found for: VALPROATE No components found for:  CBMZ  Current Medications: Current Outpatient Medications  Medication Sig Dispense Refill   busPIRone (BUSPAR) 7.5 MG tablet Take 1 tablet (7.5 mg total) by mouth daily. 90 tablet 0   gabapentin (NEURONTIN) 300 MG capsule Take 1-2 capsules (300-600 mg total) by  mouth at bedtime. 60 capsule 2   hydrOXYzine (ATARAX/VISTARIL) 25 MG tablet Take 25 mg by mouth every evening.     Insulin Pen Needle 32G X 6 MM MISC 1 each by Does not apply route daily. 100 each 1   levothyroxine (SYNTHROID) 25 MCG tablet Take 1-2 tablets (  25-50 mcg total) by mouth daily. Take one daily but 2 tablets twice weekly 38 tablet 1   Liraglutide -Weight Management (SAXENDA) 18 MG/3ML SOPN Inject 0.6-3 mg into the skin daily. 15 mL 1   mirtazapine (REMERON) 7.5 MG tablet Take 1 tablet (7.5 mg total) by mouth at bedtime. For sleep and anxiety 90 tablet 0   omeprazole (PRILOSEC) 40 MG capsule Take 1 capsule (40 mg total) by mouth at bedtime. 90 capsule 1   prazosin (MINIPRESS) 1 MG capsule Take 1 capsule (1 mg total) by mouth at bedtime. 90 capsule 0   SUMAtriptan (IMITREX) 100 MG tablet Take 1 tablet (100 mg total) by mouth every 2 (two) hours as needed for migraine. May repeat in 2 hours if headache persists or recurs. 10 tablet 0   topiramate (TOPAMAX) 100 MG tablet Take 1 tablet (100 mg total) by mouth every evening. 90 tablet 1   venlafaxine XR (EFFEXOR-XR) 150 MG 24 hr capsule Take 1 capsule (150 mg total) by mouth every evening. Take with 75 mg to equal 225 mg daily 90 capsule 0   venlafaxine XR (EFFEXOR-XR) 75 MG 24 hr capsule Take 1 capsule (75 mg total) by mouth every evening. Take with 150 mg to equal 225 mg daily 90 capsule 0   No current facility-administered medications for this visit.     Musculoskeletal: Strength & Muscle Tone:  UTA Gait & Station:  UTA Patient leans: N/A  Psychiatric Specialty Exam: Review of Systems  Musculoskeletal:        Rt.sided knee and left sided hip pain - chronic  Psychiatric/Behavioral:  The patient is nervous/anxious.        Grieving  All other systems reviewed and are negative.  Last menstrual period 03/07/2020.There is no height or weight on file to calculate BMI.  General Appearance:  UTA  Eye Contact:   UTA  Speech:  Clear and  Coherent  Volume:  Normal  Mood:   Grieving , anxious  Affect:   UTA  Thought Process:  Linear and Descriptions of Associations: Intact  Orientation:  Full (Time, Place, and Person)  Thought Content: Logical   Suicidal Thoughts:  No  Homicidal Thoughts:  No  Memory:  Immediate;   Fair Recent;   Fair Remote;   Fair  Judgement:  Fair  Insight:  Fair  Psychomotor Activity:   UTA  Concentration:  Concentration: Fair and Attention Span: Fair  Recall:  [employer] of Knowledge: Fair  Language: Fair  Akathisia:  No  Handed:  Right  AIMS (if indicated): not done  Assets:  Communication Skills Desire for Improvement Housing Intimacy Social Support Transportation  ADL's:  Intact  Cognition: WNL  Sleep:  Fair   Screenings: GAD-7    Flowsheet Row Office Visit from 05/27/2020 in [facility] Office Visit from 04/12/2020 in [facility] Office Visit from 10/12/2019 in [facility] Office Visit from 06/20/2019 in [facility] Office Visit from 03/23/2019 in [facility]  Total GAD-7 Score '8 4 15 2 1      '$ PHQ2-9    Flowsheet Row Video Visit from 02/10/2021 in [place] Office Visit from 02/07/2021 in [facility] Office Visit from 12/26/2020 in [place] Office Visit from 11/05/2020 in [facility] Office Visit from 05/27/2020 in [facility]  PHQ-2 Total Score 2 2 0 2 2  PHQ-9 Total Score 8  5 -- 5 9      Flowsheet Row Video Visit from 02/10/2021 in [facility]        Assessment and Plan: Kelly [name] is a 55 year old female who has a history of panic attacks, insomnia, MDD, arthritis, GAD was evaluated by telemedicine today.  Patient with multiple psychosocial stressors due to her pain, recent death in the family, is  currently grieving and will benefit from grief counseling.  Discussed plan as noted below.  Plan MDD in partial remission PHQ-9 today equals 8 Venlafaxine 225 mg p.o. daily Mirtazapine 7.5 mg p.o. nightly  GAD-improving Venlafaxine as prescribed Mirtazapine 7.5 mg p.o. nightly BuSpar 7.5 mg p.o. daily Continue CBT with Ms. Marjie Skiff  Panic disorder-improving BuSpar 7.5 mg p.o. daily.  Dose reduced due to side effects Hydroxyzine 25 mg p.o. twice daily as needed Continue CBT  Insomnia-stable Mirtazapine 7.5 mg p.o. nightly Prazosin 1 mg p.o. nightly as needed  Bereavement-unstable Provided grief counseling. Patient also to contact her psychotherapist.  Follow-up in clinic in 3 to 4 weeks or sooner if needed.  Patient will benefit from in office visits since she is unable to connect by video.   I have spent at least 24 minutes non face to face with patient today .   This note was generated in part or whole with voice recognition software. Voice recognition is usually quite accurate but there are transcription errors that can and very often do occur. I apologize for any typographical errors that were not detected and corrected.        Kelly Alert, MD 02/10/2021, 2:18 PM

## 2021-02-24 ENCOUNTER — Encounter: Payer: Self-pay | Admitting: Family Medicine

## 2021-03-14 ENCOUNTER — Other Ambulatory Visit: Payer: Self-pay

## 2021-03-14 ENCOUNTER — Encounter: Payer: Self-pay | Admitting: Psychiatry

## 2021-03-14 ENCOUNTER — Ambulatory Visit (INDEPENDENT_AMBULATORY_CARE_PROVIDER_SITE_OTHER): Payer: Commercial Managed Care - PPO | Admitting: Psychiatry

## 2021-03-14 VITALS — BP 134/79 | HR 67 | Temp 98.1°F | Wt 226.0 lb

## 2021-03-14 DIAGNOSIS — G4701 Insomnia due to medical condition: Secondary | ICD-10-CM

## 2021-03-14 DIAGNOSIS — F411 Generalized anxiety disorder: Secondary | ICD-10-CM

## 2021-03-14 DIAGNOSIS — F410 Panic disorder [episodic paroxysmal anxiety] without agoraphobia: Secondary | ICD-10-CM

## 2021-03-14 DIAGNOSIS — F3342 Major depressive disorder, recurrent, in full remission: Secondary | ICD-10-CM | POA: Diagnosis not present

## 2021-03-14 DIAGNOSIS — Z634 Disappearance and death of family member: Secondary | ICD-10-CM

## 2021-03-14 MED ORDER — HYDROXYZINE HCL 25 MG PO TABS: 25.0000 mg | ORAL_TABLET | Freq: Two times a day (BID) | ORAL | 0 refills | Status: DC | PRN

## 2021-03-14 NOTE — Progress Notes (Signed)
[facility] MD/PA/NP OP Progress Note  03/14/2021 9:37 AM Kelly [name]  MRN:  [id number]  Chief Complaint:  Chief Complaint   Follow-up    HPI: Kelly [name] is a 55 year old Caucasian female who is married, lives in [place], has a history of MDD, GAD, panic disorder, insomnia, gastroesophageal reflux disease, lumbar disc problem, migraine headache was evaluated in office today.  Patient today reports her daughter-in-law passed away recently.  She reports she is also grieving the loss of her paternal aunt.  Patient reports there has been a lot of psychosocial stressors including taking care of the grandchildren who are aged 56 year old and 56 year old, supporting her son who is having a hard time with everything going on, taking care of her father who has dementia.  Patient reports there has been times when she felt extremely anxious and nervous and on edge.  However she is feeling better and better with everything than how she felt at the beginning.  She reports the medications helps her to handle things in a calm way rather than going to a panic mode.  Patient reports since the death of her daughter-in-law she has had a few nightmares.  She had stopped taking prazosin however had to restart taking it due to the recent nightmares.  The prazosin has been helpful.  She continues to take the mirtazapine at bedtime.  Patient continues to follow-up with her therapist.  Therapy sessions helps a lot.  Patient denies any suicidality, homicidality or perceptual disturbances.  Patient with recent TSH of over 15.  Patient was started on low-dose levothyroxine, continues to follow-up with Dr. Ancil Boozer for management.  Patient denies any other concerns today.  Visit Diagnosis:    ICD-10-CM   1. MDD (major depressive disorder), recurrent, in full remission ([place])  F33.42     2. GAD (generalized anxiety disorder)  F41.1 hydrOXYzine (ATARAX/VISTARIL) 25 MG tablet    3. Panic disorder  F41.0 hydrOXYzine  (ATARAX/VISTARIL) 25 MG tablet    4. Insomnia due to medical condition  G47.01    thyroid abnormality, anxiety    5. Bereavement  Z63.4       Past Psychiatric History: Reviewed past psychiatric history from progress note on 10/25/2018.  Past also medications like Paxil, trazodone, hydroxyzine.  Patient completed IOP on 10/03/2019.  Past Medical History:  Past Medical History:  Diagnosis Date   Allergy    Anxiety    Depression    GERD (gastroesophageal reflux disease)    History of kidney stones    Insomnia    Intermittent low back pain    Migraines    Osteoarthritis    Recurrent UTI    Restless leg syndrome    Sciatica of right side    left side   Symptomatic menopausal or female climacteric states    Vertigo    Vitamin D deficiency     Past Surgical History:  Procedure Laterality Date   ABDOMINAL HYSTERECTOMY     BACK SURGERY     COLONOSCOPY WITH PROPOFOL N/A 02/03/2019   Procedure: COLONOSCOPY WITH PROPOFOL;  Surgeon: Jonathon Bellows, MD;  Location: [facility] ENDOSCOPY;  Service: Gastroenterology;  Laterality: N/A;   COLONOSCOPY WITH PROPOFOL N/A 03/10/2019   Procedure: COLONOSCOPY WITH PROPOFOL;  Surgeon: Jonathon Bellows, MD;  Location: [facility] ENDOSCOPY;  Service: Gastroenterology;  Laterality: N/A;   CYSTOSCOPY N/A 06/25/2020   Procedure: CYSTOSCOPY;  Surgeon: Homero Fellers, MD;  Location: [facility] ORS;  Service: Gynecology;  Laterality: N/A;   DIAGNOSTIC  LAPAROSCOPY     DILATION AND CURETTAGE OF UTERUS     HYSTEROSCOPY WITH D & C N/A 05/29/2020   Procedure: DILATATION AND CURETTAGE /HYSTEROSCOPY;  Surgeon: Homero Fellers, MD;  Location: [facility] ORS;  Service: Gynecology;  Laterality: N/A;   KNEE ARTHROSCOPY WITH MEDIAL MENISECTOMY Left 01/14/2017   Procedure: KNEE ARTHROSCOPY WITH MEDIAL AND LATERAL MENISECTOMY;  Surgeon: Hessie Knows, MD;  Location: [facility] ORS;  Service: Orthopedics;  Laterality: Left;  Partial Knee menisectomy   KNEE SURGERY Left 05/2011   Dr. Rudene Christians-  Arthroscopic   LUMBAR LAMINECTOMY/DECOMPRESSION MICRODISCECTOMY N/A 09/16/2016   Procedure: LUMBAR LAMINECTOMY/DECOMPRESSION MICRODISCECTOMY 1 LEVEL L5-S1;  Surgeon: Meade Maw, MD;  Location: [facility] ORS;  Service: Neurosurgery;  Laterality: N/A;   TONSILLECTOMY AND ADENOIDECTOMY     TOTAL LAPAROSCOPIC HYSTERECTOMY WITH SALPINGECTOMY Bilateral 06/25/2020   Procedure: TOTAL LAPAROSCOPIC HYSTERECTOMY WITH BILATERAL SALPINGECTOMY;  Surgeon: Homero Fellers, MD;  Location: [facility] ORS;  Service: Gynecology;  Laterality: Bilateral;   URETHRAL STRICTURE DILATATION     WISDOM TOOTH EXTRACTION      Family Psychiatric History: Reviewed family psychiatric history from progress note on 10/03/2019  Family History:  Family History  Problem Relation Age of Onset   Dementia Father    Dementia Paternal Aunt    Cancer Paternal Grandmother    Anxiety disorder Cousin    Depression Cousin     Social History: Reviewed social history from progress note on 10/03/2019 Social History   Socioeconomic History   Marital status: Married    Spouse name: John   Number of children: 2   Years of education: 14   Highest education level: Associate degree: occupational, Hotel manager, or vocational program  Occupational History   Occupation: not employed  Tobacco Use   Smoking status: Former    Packs/day: 0.75    Years: 2.00    Pack years: 1.50    Types: Cigarettes    Start date: 07/06/1981    Quit date: 07/07/1983    Years since quitting: 37.7   Smokeless tobacco: Never  Vaping Use   Vaping Use: Never used  Substance and Sexual Activity   Alcohol use: No    Alcohol/week: 0.0 standard drinks   Drug use: No   Sexual activity: Yes    Partners: Male  Other Topics Concern   Not on file  Social History Narrative   Married and husband has a lot of medical problems, he is working again    Investment banker, operational of Radio broadcast assistant Strain: Not on file  Food Insecurity: Not on file   Transportation Needs: Not on file  Physical Activity: Not on file  Stress: Not on file  Social Connections: Not on file    Allergies:  Allergies  Allergen Reactions   Penicillins Anaphylaxis, Hives, Nausea And Vomiting and Swelling    Has patient had a PCN reaction causing immediate rash, facial/tongue/throat swelling, SOB or lightheadedness with hypotension: Yes Has patient had a PCN reaction causing severe rash involving mucus membranes or skin necrosis: Yes Has patient had a PCN reaction that required hospitalization No Has patient had a PCN reaction occurring within the last 10 years: Yes If all of the above answers are "NO", then may proceed with Cephalosporin use.    Chlorhexidine Gluconate Itching and Rash   Ciprofloxacin Hives   Clindamycin/Lincomycin Hives   Erythromycin Hives and Nausea And Vomiting   Keflex [Cephalexin] Hives   Lyrica [Pregabalin]     Dizziness, syncope   Nitrofurantoin Monohyd Macro  Hives and Nausea And Vomiting   Sulfa Antibiotics Hives and Nausea And Vomiting    Rapid heart rate   Tetracyclines & Related Hives   Adhesive [Tape] Rash   Latex Rash   Vancomycin Itching and Rash    Metabolic Disorder Labs: Lab Results  Component Value Date   HGBA1C 5.6 03/21/2020   MPG 114 03/21/2020   MPG 105 03/31/2019   No results found for: PROLACTIN Lab Results  Component Value Date   CHOL 179 03/21/2020   TRIG 146 03/21/2020   HDL 59 03/21/2020   CHOLHDL 3.0 03/21/2020   VLDL 22 12/31/2016   LDLCALC 95 03/21/2020   LDLCALC 82 06/17/2018   Lab Results  Component Value Date   TSH 8.22 (H) 02/07/2021   TSH 15.60 (H) 12/26/2020    Therapeutic Level Labs: No results found for: LITHIUM No results found for: VALPROATE No components found for:  CBMZ  Current Medications: Current Outpatient Medications  Medication Sig Dispense Refill   busPIRone (BUSPAR) 7.5 MG tablet Take 1 tablet (7.5 mg total) by mouth daily. 90 tablet 0   gabapentin  (NEURONTIN) 300 MG capsule Take 1-2 capsules (300-600 mg total) by mouth at bedtime. 60 capsule 2   Insulin Pen Needle 32G X 6 MM MISC 1 each by Does not apply route daily. 100 each 1   levothyroxine (SYNTHROID) 25 MCG tablet Take 1-2 tablets (25-50 mcg total) by mouth daily. Take one daily but 2 tablets twice weekly 38 tablet 1   Liraglutide -Weight Management (SAXENDA) 18 MG/3ML SOPN Inject 0.6-3 mg into the skin daily. 15 mL 1   mirtazapine (REMERON) 7.5 MG tablet Take 1 tablet (7.5 mg total) by mouth at bedtime. For sleep and anxiety 90 tablet 0   omeprazole (PRILOSEC) 40 MG capsule Take 1 capsule (40 mg total) by mouth at bedtime. 90 capsule 1   prazosin (MINIPRESS) 1 MG capsule Take 1 capsule (1 mg total) by mouth at bedtime. 90 capsule 0   topiramate (TOPAMAX) 100 MG tablet Take 1 tablet (100 mg total) by mouth every evening. 90 tablet 1   venlafaxine XR (EFFEXOR-XR) 150 MG 24 hr capsule Take 1 capsule (150 mg total) by mouth every evening. Take with 75 mg to equal 225 mg daily 90 capsule 0   venlafaxine XR (EFFEXOR-XR) 75 MG 24 hr capsule Take 1 capsule (75 mg total) by mouth every evening. Take with 150 mg to equal 225 mg daily 90 capsule 0   hydrOXYzine (ATARAX/VISTARIL) 25 MG tablet Take 1 tablet (25 mg total) by mouth 2 (two) times daily as needed. For anxiety attacks only 180 tablet 0   SUMAtriptan (IMITREX) 100 MG tablet Take 1 tablet (100 mg total) by mouth every 2 (two) hours as needed for migraine. May repeat in 2 hours if headache persists or recurs. 10 tablet 0   No current facility-administered medications for this visit.     Musculoskeletal: Strength & Muscle Tone: within normal limits Gait & Station: normal Patient leans: N/A  Psychiatric Specialty Exam: Review of Systems  Musculoskeletal:  Positive for neck pain.  Psychiatric/Behavioral:  Positive for sleep disturbance. The patient is nervous/anxious.   All other systems reviewed and are negative.  Blood pressure  134/79, pulse 67, temperature 98.1 F (36.7 C), temperature source Temporal, weight 226 lb (102.5 kg), last menstrual period 03/07/2020.Body mass index is 38.79 kg/m.  General Appearance: Casual  Eye Contact:  Good  Speech:  Clear and Coherent  Volume:  Normal  Mood:  Anxious  Affect:  Congruent  Thought Process:  Goal Directed and Descriptions of Associations: Intact  Orientation:  Full (Time, Place, and Person)  Thought Content: Logical   Suicidal Thoughts:  No  Homicidal Thoughts:  No  Memory:  Immediate;   Fair Recent;   Fair Remote;   Fair  Judgement:  Fair  Insight:  Fair  Psychomotor Activity:  Normal  Concentration:  Concentration: Fair and Attention Span: Fair  Recall:  [employer] of Knowledge: Fair  Language: Fair  Akathisia:  No  Handed:  Right  AIMS (if indicated): done  Assets:  Communication Skills Desire for Improvement Housing Social Support Transportation  ADL's:  Intact  Cognition: WNL  Sleep:   restless , nightmares   Screenings: GAD-7    Flowsheet Row Office Visit from 05/27/2020 in [facility] Office Visit from 04/12/2020 in [facility] Office Visit from 10/12/2019 in [facility] Office Visit from 06/20/2019 in [facility] Office Visit from 03/23/2019 in [facility]  Total GAD-7 Score '8 4 15 2 1      '$ PHQ2-9    [place] Visit from 03/14/2021 in [place] Video Visit from 02/10/2021 in [facility] Office Visit from 02/07/2021 in [facility] Office Visit from 12/26/2020 in [place] Office Visit from 11/05/2020 in [facility]  PHQ-2 Total Score '2 2 2 '$ 0 2  PHQ-9 Total Score '8 8 5 '$ -- 5      Flowsheet Row Video Visit from 02/10/2021 in [place]         Assessment and Plan: HUGUETTE GAMBOA is a 55 year old female who has a history of MDD, insomnia, arthritis, GAD was evaluated in office today.  Patient with recent psychosocial stressors, multiple deaths in the family does report sleep problems, nightmares as well as anxiety.  Patient also with thyroid abnormalities which could also contribute to mood symptoms and sleep problems.  Discussed plan as noted below.  Plan MDD in remission PHQ-9 today equals 8 Venlafaxine 225 mg p.o. daily Mirtazapine 7.5 mg p.o. nightly  GAD-unstable Restart hydroxyzine 25 mg p.o. twice daily as needed for severe anxiety Venlafaxine 225 mg p.o. daily Mirtazapine 7.5 mg p.o. nightly Patient to continue psychotherapy sessions with Cheryl-therapist.  Panic disorder-stable BuSpar 7.5 mg p.o. daily.  Insomnia-unstable Patient has restarted prazosin 1 mg p.o. nightly as needed for nightmares which has been beneficial. Mirtazapine 7.5 mg p.o. nightly Patient will monitor her sleep, work on sleep hygiene.  Bereavement-unstable Provided grief support. Patient to continue counseling with her therapist.  Patient will also benefit from management of her hypothyroidism since abnormal thyroid levels does have an impact on her mood, sleep.  Patient provided education.  Reviewed notes per Dr. Ancil Boozer dated 02/07/2021 as noted above.  Patient will continue to follow up with pain management, Dr. Holley Raring.  Follow-up in clinic in 3 to 4 weeks or sooner if needed.  This note was generated in part or whole with voice recognition software. Voice recognition is usually quite accurate but there are transcription errors that can and very often do occur. I apologize for any typographical errors that were not detected and corrected.      Ursula Alert, MD 03/14/2021, 9:37 AM

## 2021-03-24 ENCOUNTER — Ambulatory Visit: Payer: Self-pay | Admitting: *Deleted

## 2021-03-24 NOTE — Telephone Encounter (Signed)
Pt reports direct exposure to covid, household. Grandson tested positive today. Pt is asymptomatic. Calling for care advise and guidelines/ Reviewed all per protocol, pt verbalizes understanding. Advised to CB if any additional questions arise,symptoms or tests positive. Will test in 3-5 days post exposure.     Reason for Disposition  [1] CLOSE CONTACT COVID-19 EXPOSURE within last 14 days AND [2] NO symptoms  Answer Assessment - Initial Assessment Questions 1. COVID-19 EXPOSURE: "Please describe how you were exposed to someone with a COVID-19 infection."     Grandson 2. PLACE of CONTACT: "Where were you when you were exposed to COVID-19?" (e.g., home, school, medical waiting room; which city?)     home 3. TYPE of CONTACT: "How much contact was there?" (e.g., sitting next to, live in same house, work in same office, same building)     Household 4. DURATION of CONTACT: "How long were you in contact with the COVID-19 patient?" (e.g., a few seconds, passed by person, a few minutes, 15 minutes or longer, live with the patient)     hours 5. MASK: "Were you wearing a mask?" "Was the other person wearing a mask?" Note: wearing a mask reduces the risk of an otherwise close contact.     no 6. DATE of CONTACT: "When did you have contact with a COVID-19 patient?" (e.g., how many days ago)     today 7. COMMUNITY SPREAD: "Are there lots of cases of COVID-19 (community spread) where you live?" (See public health department website, if unsure)     unsure 8. SYMPTOMS: "Do you have any symptoms?" (e.g., fever, cough, breathing difficulty, loss of taste or smell)     no 9. VACCINE: "Have you gotten the COVID-19 vaccine?" If Yes, ask: "Which one, how many shots, when did you get it?"     no 10. BOOSTER: "Have you received your COVID-19 booster?" If Yes, ask: "Which one and when did you get it?"  12. HIGH RISK: "Do you have any heart or lung problems?" (e.g., asthma , COPD, heart failure) "Do you have a  weak immune system or other risk factors?" (e.g., HIV positive, chemotherapy, renal failure, diabetes mellitus, sickle cell anemia, obesity)       *No Answer*  Protocols used: Coronavirus (COVID-19) Exposure-A-AH

## 2021-03-25 ENCOUNTER — Other Ambulatory Visit: Payer: Self-pay | Admitting: Family Medicine

## 2021-03-25 DIAGNOSIS — R7989 Other specified abnormal findings of blood chemistry: Secondary | ICD-10-CM

## 2021-04-02 LAB — THYROID PEROXIDASE ANTIBODY: Thyroperoxidase Ab SerPl-aCnc: 12 IU/mL — ABNORMAL HIGH (ref <9)

## 2021-04-03 ENCOUNTER — Encounter: Payer: Self-pay | Admitting: Family Medicine

## 2021-04-07 ENCOUNTER — Other Ambulatory Visit: Payer: Self-pay | Admitting: Family Medicine

## 2021-04-07 DIAGNOSIS — E039 Hypothyroidism, unspecified: Secondary | ICD-10-CM

## 2021-04-10 LAB — TSH: TSH: 4.51 mIU/L — ABNORMAL HIGH (ref 0.40–4.50)

## 2021-04-14 ENCOUNTER — Other Ambulatory Visit: Payer: Self-pay

## 2021-04-14 ENCOUNTER — Telehealth (INDEPENDENT_AMBULATORY_CARE_PROVIDER_SITE_OTHER): Payer: Commercial Managed Care - PPO | Admitting: Psychiatry

## 2021-04-14 ENCOUNTER — Encounter: Payer: Self-pay | Admitting: Psychiatry

## 2021-04-14 DIAGNOSIS — F411 Generalized anxiety disorder: Secondary | ICD-10-CM

## 2021-04-14 DIAGNOSIS — G4701 Insomnia due to medical condition: Secondary | ICD-10-CM | POA: Insufficient documentation

## 2021-04-14 DIAGNOSIS — F3342 Major depressive disorder, recurrent, in full remission: Secondary | ICD-10-CM

## 2021-04-14 DIAGNOSIS — F410 Panic disorder [episodic paroxysmal anxiety] without agoraphobia: Secondary | ICD-10-CM | POA: Diagnosis not present

## 2021-04-14 DIAGNOSIS — Z634 Disappearance and death of family member: Secondary | ICD-10-CM

## 2021-04-14 NOTE — Progress Notes (Signed)
Virtual Visit via Video Note  I connected with Kelly [name] on 04/14/21 at 11:30 AM EDT by a video enabled telemedicine application and verified that I am speaking with the correct person using two identifiers.  Location Provider Location : [facility] Patient Location : Home  Participants: Patient , Provider    I discussed the limitations of evaluation and management by telemedicine and the availability of in person appointments. The patient expressed understanding and agreed to proceed.    I discussed the assessment and treatment plan with the patient. The patient was provided an opportunity to ask questions and all were answered. The patient agreed with the plan and demonstrated an understanding of the instructions.   The patient was advised to call back or seek an in-person evaluation if the symptoms worsen or if the condition fails to improve as anticipated.  Video connection was lost at less than 50% of the duration of the visit, at which time the remainder of the visit was completed through audio only     [facility] MD OP Progress Note  04/14/2021 1:57 PM KEISA BLOW  MRN:  [id number]  Chief Complaint:  Chief Complaint   Follow-up; Anxiety    HPI: Kelly [name] is a 55 year old Caucasian female who is married, lives in [place], has a history of MDD, GAD, panic disorder, insomnia, gastroesophageal reflux disease, lumbar disc problem, migraine headache was evaluated by telemedicine today.  Patient today reports she continues to grieve the loss of her daughter-in-law who passed away recently.  She is trying to support her grandchildren who are 76 and 105 yrs old.  Patient reports she does have days when her grief is triggered by certain events or memories.  She however has been able to cope with it, is able to distract herself.  Patient reports sleep is restless mostly because of pain.  She is trying to manage her pain by taking Aleve.  Patient reports she continues  to follow-up with her therapist and reports therapy sessions are helpful.  She denies any suicidality, homicidality or perceptual disturbances.  Currently compliant on the prazosin, venlafaxine and mirtazapine.  Denies side effects.  She is compliant on levothyroxine and reports her TSH abnormalities are getting better.  Patient denies any other concerns today.  Visit Diagnosis:    ICD-10-CM   1. MDD (major depressive disorder), recurrent, in full remission ([place])  F33.42     2. GAD (generalized anxiety disorder)  F41.1     3. Panic disorder  F41.0     4. Insomnia due to medical condition  G47.01    thyroid problem, anxiety    5. Bereavement  Z63.4       Past Psychiatric History: Reviewed past psychiatric history from progress note on 10/25/2018.  Patient on medications like Paxil, trazodone, hydroxyzine.  Patient completed IOP on 10/03/2019.  Past Medical History:  Past Medical History:  Diagnosis Date   Allergy    Anxiety    Depression    GERD (gastroesophageal reflux disease)    History of kidney stones    Insomnia    Intermittent low back pain    Migraines    Osteoarthritis    Recurrent UTI    Restless leg syndrome    Sciatica of right side    left side   Symptomatic menopausal or female climacteric states    Vertigo    Vitamin D deficiency     Past Surgical History:  Procedure Laterality Date   ABDOMINAL HYSTERECTOMY  BACK SURGERY     COLONOSCOPY WITH PROPOFOL N/A 02/03/2019   Procedure: COLONOSCOPY WITH PROPOFOL;  Surgeon: Jonathon Bellows, MD;  Location: [facility] ENDOSCOPY;  Service: Gastroenterology;  Laterality: N/A;   COLONOSCOPY WITH PROPOFOL N/A 03/10/2019   Procedure: COLONOSCOPY WITH PROPOFOL;  Surgeon: Jonathon Bellows, MD;  Location: [facility] ENDOSCOPY;  Service: Gastroenterology;  Laterality: N/A;   CYSTOSCOPY N/A 06/25/2020   Procedure: CYSTOSCOPY;  Surgeon: Homero Fellers, MD;  Location: [facility] ORS;  Service: Gynecology;  Laterality: N/A;   DIAGNOSTIC  LAPAROSCOPY     DILATION AND CURETTAGE OF UTERUS     HYSTEROSCOPY WITH D & C N/A 05/29/2020   Procedure: DILATATION AND CURETTAGE /HYSTEROSCOPY;  Surgeon: Homero Fellers, MD;  Location: [facility] ORS;  Service: Gynecology;  Laterality: N/A;   KNEE ARTHROSCOPY WITH MEDIAL MENISECTOMY Left 01/14/2017   Procedure: KNEE ARTHROSCOPY WITH MEDIAL AND LATERAL MENISECTOMY;  Surgeon: Hessie Knows, MD;  Location: [facility] ORS;  Service: Orthopedics;  Laterality: Left;  Partial Knee menisectomy   KNEE SURGERY Left 05/2011   Dr. Rudene Christians- Arthroscopic   LUMBAR LAMINECTOMY/DECOMPRESSION MICRODISCECTOMY N/A 09/16/2016   Procedure: LUMBAR LAMINECTOMY/DECOMPRESSION MICRODISCECTOMY 1 LEVEL L5-S1;  Surgeon: Meade Maw, MD;  Location: [facility] ORS;  Service: Neurosurgery;  Laterality: N/A;   TONSILLECTOMY AND ADENOIDECTOMY     TOTAL LAPAROSCOPIC HYSTERECTOMY WITH SALPINGECTOMY Bilateral 06/25/2020   Procedure: TOTAL LAPAROSCOPIC HYSTERECTOMY WITH BILATERAL SALPINGECTOMY;  Surgeon: Homero Fellers, MD;  Location: [facility] ORS;  Service: Gynecology;  Laterality: Bilateral;   URETHRAL STRICTURE DILATATION     WISDOM TOOTH EXTRACTION      Family Psychiatric History: Reviewed family psychiatric history from progress note on 10/25/2018  Family History:  Family History  Problem Relation Age of Onset   Dementia Father    Dementia Paternal Aunt    Cancer Paternal Grandmother    Anxiety disorder Cousin    Depression Cousin     Social History: Reviewed social history from progress note on 10/25/2018 Social History   Socioeconomic History   Marital status: Married    Spouse name: John   Number of children: 2   Years of education: 14   Highest education level: Associate degree: occupational, Hotel manager, or vocational program  Occupational History   Occupation: not employed  Tobacco Use   Smoking status: Former    Packs/day: 0.75    Years: 2.00    Pack years: 1.50    Types: Cigarettes    Start date:  07/06/1981    Quit date: 07/07/1983    Years since quitting: 37.7   Smokeless tobacco: Never  Vaping Use   Vaping Use: Never used  Substance and Sexual Activity   Alcohol use: No    Alcohol/week: 0.0 standard drinks   Drug use: No   Sexual activity: Yes    Partners: Male  Other Topics Concern   Not on file  Social History Narrative   Married and husband has a lot of medical problems, he is working again    Investment banker, operational of Radio broadcast assistant Strain: Not on file  Food Insecurity: Not on file  Transportation Needs: Not on file  Physical Activity: Not on file  Stress: Not on file  Social Connections: Not on file    Allergies:  Allergies  Allergen Reactions   Penicillins Anaphylaxis, Hives, Nausea And Vomiting and Swelling    Has patient had a PCN reaction causing immediate rash, facial/tongue/throat swelling, SOB or lightheadedness with hypotension: Yes Has patient had a PCN reaction causing severe rash  involving mucus membranes or skin necrosis: Yes Has patient had a PCN reaction that required hospitalization No Has patient had a PCN reaction occurring within the last 10 years: Yes If all of the above answers are "NO", then may proceed with Cephalosporin use.    Chlorhexidine Gluconate Itching and Rash   Ciprofloxacin Hives   Clindamycin/Lincomycin Hives   Erythromycin Hives and Nausea And Vomiting   Keflex [Cephalexin] Hives   Lyrica [Pregabalin]     Dizziness, syncope   Nitrofurantoin Monohyd Macro Hives and Nausea And Vomiting   Sulfa Antibiotics Hives and Nausea And Vomiting    Rapid heart rate   Tetracyclines & Related Hives   Adhesive [Tape] Rash   Latex Rash   Vancomycin Itching and Rash    Metabolic Disorder Labs: Lab Results  Component Value Date   HGBA1C 5.6 03/21/2020   MPG 114 03/21/2020   MPG 105 03/31/2019   No results found for: PROLACTIN Lab Results  Component Value Date   CHOL 179 03/21/2020   TRIG 146 03/21/2020   HDL 59  03/21/2020   CHOLHDL 3.0 03/21/2020   VLDL 22 12/31/2016   LDLCALC 95 03/21/2020   LDLCALC 82 06/17/2018   Lab Results  Component Value Date   TSH 4.51 (H) 04/10/2021   TSH 8.22 (H) 02/07/2021    Therapeutic Level Labs: No results found for: LITHIUM No results found for: VALPROATE No components found for:  CBMZ  Current Medications: Current Outpatient Medications  Medication Sig Dispense Refill   busPIRone (BUSPAR) 7.5 MG tablet Take 1 tablet (7.5 mg total) by mouth daily. 90 tablet 0   gabapentin (NEURONTIN) 300 MG capsule Take 1-2 capsules (300-600 mg total) by mouth at bedtime. 60 capsule 2   hydrOXYzine (ATARAX/VISTARIL) 25 MG tablet Take 1 tablet (25 mg total) by mouth 2 (two) times daily as needed. For anxiety attacks only 180 tablet 0   Insulin Pen Needle 32G X 6 MM MISC 1 each by Does not apply route daily. 100 each 1   levothyroxine (SYNTHROID) 25 MCG tablet TAKE ONE TABLET BY MOUTH DAILY 7 tablet 0   Liraglutide -Weight Management (SAXENDA) 18 MG/3ML SOPN Inject 0.6-3 mg into the skin daily. 15 mL 1   mirtazapine (REMERON) 7.5 MG tablet Take 1 tablet (7.5 mg total) by mouth at bedtime. For sleep and anxiety 90 tablet 0   omeprazole (PRILOSEC) 40 MG capsule Take 1 capsule (40 mg total) by mouth at bedtime. 90 capsule 1   prazosin (MINIPRESS) 1 MG capsule Take 1 capsule (1 mg total) by mouth at bedtime. 90 capsule 0   SUMAtriptan (IMITREX) 100 MG tablet Take 1 tablet (100 mg total) by mouth every 2 (two) hours as needed for migraine. May repeat in 2 hours if headache persists or recurs. 10 tablet 0   topiramate (TOPAMAX) 100 MG tablet Take 1 tablet (100 mg total) by mouth every evening. 90 tablet 1   venlafaxine XR (EFFEXOR-XR) 150 MG 24 hr capsule Take 1 capsule (150 mg total) by mouth every evening. Take with 75 mg to equal 225 mg daily 90 capsule 0   venlafaxine XR (EFFEXOR-XR) 75 MG 24 hr capsule Take 1 capsule (75 mg total) by mouth every evening. Take with 150 mg to  equal 225 mg daily 90 capsule 0   No current facility-administered medications for this visit.     Musculoskeletal: Strength & Muscle Tone:  UTA Gait & Station:  UTA Patient leans: N/A  Psychiatric Specialty Exam: Review of  Systems  Musculoskeletal:        Left sided hip pain  Psychiatric/Behavioral:  Positive for sleep disturbance.        Grieving  All other systems reviewed and are negative.  Last menstrual period 03/07/2020.There is no height or weight on file to calculate BMI.  General Appearance:  UTA  Eye Contact:   UTA  Speech:  Normal Rate  Volume:   UTA  Mood:   Grieving  Affect:   UTA  Thought Process:  Goal Directed and Descriptions of Associations: Intact  Orientation:  Full (Time, Place, and Person)  Thought Content: Logical   Suicidal Thoughts:  No  Homicidal Thoughts:  No  Memory:  Immediate;   Fair Recent;   Fair Remote;   Fair  Judgement:  Fair  Insight:  Fair  Psychomotor Activity:   UTA  Concentration:  Concentration: Fair and Attention Span: Fair  Recall:  [employer] of Knowledge: Fair  Language: Fair  Akathisia:  No  Handed:  Right  AIMS (if indicated): done  Assets:  Communication Skills Desire for [place] Talents/Skills Transportation  ADL's:  Intact  Cognition: WNL  Sleep:   restless   Screenings: GAD-7    Flowsheet Row Office Visit from 05/27/2020 in [facility] Office Visit from 04/12/2020 in [facility] Office Visit from 10/12/2019 in [facility] Office Visit from 06/20/2019 in [facility] Office Visit from 03/23/2019 in [facility]  Total GAD-7 Score 8 4 15 2 1       PHQ2-9    [place] Visit from 03/14/2021 in [place] Video Visit from 02/10/2021 in [place] Office Visit from 02/07/2021 in [facility] Office Visit from  12/26/2020 in [place] Office Visit from 11/05/2020 in [facility]  PHQ-2 Total Score 2 2 2  0 2  PHQ-9 Total Score 8 8 5  -- 5      Flowsheet Row Video Visit from 02/10/2021 in [place]        Assessment and Plan: TANJI STORRS is a 55 year old female who has a history of MDD, insomnia, arthritis, GAD was evaluated in office today.  Patient with recent psychosocial stressors, multiple deaths in the family, currently improving on the current medication regimen and psychotherapy sessions.  Plan as noted below.  Plan MDD in remission Venlafaxine 225 mg p.o. daily Mirtazapine 7.5 mg p.o. nightly AIMS - 0  GAD-improving Hydroxyzine 25 mg p.o. twice daily as needed for severe anxiety Venlafaxine 225 mg p.o. daily Mirtazapine 7.5 mg p.o. nightly  Panic disorder-stable BuSpar 7.5 mg p.o. daily  Insomnia-improving Prazosin 1 mg p.o. nightly as needed Mirtazapine 7.5 mg p.o. nightly Patient will need sufficient pain management  Bereavement-improving Continue grief counseling.   This note was generated in part or whole with voice recognition software. Voice recognition is usually quite accurate but there are transcription errors that can and very often do occur. I apologize for any typographical errors that were not detected and corrected.    Follow-up in clinic in 1 month in person.   I have spent at least 18 minutes non face to face with patient today.  This note was generated in part or whole with voice recognition software. Voice recognition is usually quite accurate but there are transcription errors that can and very often do occur.  I apologize for any typographical errors that were not detected and corrected.       Ursula Alert, MD 04/14/2021, 1:57 PM

## 2021-04-15 ENCOUNTER — Other Ambulatory Visit: Payer: Self-pay | Admitting: Family Medicine

## 2021-04-15 ENCOUNTER — Telehealth: Payer: Self-pay

## 2021-04-15 DIAGNOSIS — R7989 Other specified abnormal findings of blood chemistry: Secondary | ICD-10-CM

## 2021-04-15 MED ORDER — LEVOTHYROXINE SODIUM 25 MCG PO TABS: 25.0000 ug | ORAL_TABLET | Freq: Every day | ORAL | 1 refills | Status: DC

## 2021-04-28 NOTE — Telephone Encounter (Signed)
completed

## 2021-05-09 ENCOUNTER — Ambulatory Visit: Payer: Self-pay | Admitting: *Deleted

## 2021-05-09 NOTE — Telephone Encounter (Signed)
2nd attempt to contact patient on [phone] and no call can be completed. Called patient's husband [phone] , no answer, left message on voicemail to call clinic back [phone].
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
## 2021-05-09 NOTE — Telephone Encounter (Signed)
Left message on phone # [phone] to call back about symptoms.

## 2021-05-09 NOTE — Telephone Encounter (Signed)
Patient is calling because she has an appt with Dr. Ancil Boozer on Monday but has cloudy urine, pain with urine, ordor with urination, lower back pain.  Attempted to contact patient on [phone] and number will not dial out or ring. Unable to contact patient to leave a message at this time. Called patient 's husband on [place] [phone] and no answer, left message for patient to call clinic back [phone].

## 2021-05-12 ENCOUNTER — Ambulatory Visit (INDEPENDENT_AMBULATORY_CARE_PROVIDER_SITE_OTHER): Payer: Commercial Managed Care - PPO | Admitting: Family Medicine

## 2021-05-12 ENCOUNTER — Encounter: Payer: Self-pay | Admitting: Family Medicine

## 2021-05-12 ENCOUNTER — Other Ambulatory Visit: Payer: Self-pay

## 2021-05-12 DIAGNOSIS — M150 Primary generalized (osteo)arthritis: Secondary | ICD-10-CM

## 2021-05-12 DIAGNOSIS — F411 Generalized anxiety disorder: Secondary | ICD-10-CM

## 2021-05-12 DIAGNOSIS — E8881 Metabolic syndrome: Secondary | ICD-10-CM

## 2021-05-12 DIAGNOSIS — E039 Hypothyroidism, unspecified: Secondary | ICD-10-CM | POA: Diagnosis not present

## 2021-05-12 DIAGNOSIS — G2581 Restless legs syndrome: Secondary | ICD-10-CM

## 2021-05-12 DIAGNOSIS — K219 Gastro-esophageal reflux disease without esophagitis: Secondary | ICD-10-CM

## 2021-05-12 DIAGNOSIS — G43009 Migraine without aura, not intractable, without status migrainosus: Secondary | ICD-10-CM | POA: Diagnosis not present

## 2021-05-12 DIAGNOSIS — R300 Dysuria: Secondary | ICD-10-CM

## 2021-05-12 DIAGNOSIS — M159 Polyosteoarthritis, unspecified: Secondary | ICD-10-CM

## 2021-05-12 DIAGNOSIS — E559 Vitamin D deficiency, unspecified: Secondary | ICD-10-CM

## 2021-05-12 LAB — POCT URINALYSIS DIPSTICK
Bilirubin, UA: NEGATIVE
Blood, UA: NEGATIVE
Glucose, UA: NEGATIVE
Ketones, UA: NEGATIVE
Leukocytes, UA: NEGATIVE
Nitrite, UA: NEGATIVE
Protein, UA: NEGATIVE
Spec Grav, UA: >=1.03 — AB (ref 1.010–1.025)
Urobilinogen, UA: 0.2 E.U./dL
pH, UA: 6 (ref 5.0–8.0)

## 2021-05-14 ENCOUNTER — Other Ambulatory Visit: Payer: Self-pay | Admitting: Family Medicine

## 2021-05-15 ENCOUNTER — Encounter: Payer: Self-pay | Admitting: Family Medicine

## 2021-05-15 ENCOUNTER — Other Ambulatory Visit: Payer: Self-pay

## 2021-05-15 ENCOUNTER — Emergency Department
Admission: EM | Admit: 2021-05-15 | Discharge: 2021-05-15 | Disposition: A | Discharge status: Home / Self Care | Payer: Commercial Managed Care - PPO | Attending: Emergency Medicine | Admitting: Emergency Medicine

## 2021-05-15 ENCOUNTER — Ambulatory Visit (INDEPENDENT_AMBULATORY_CARE_PROVIDER_SITE_OTHER): Payer: Commercial Managed Care - PPO

## 2021-05-15 ENCOUNTER — Other Ambulatory Visit: Payer: Self-pay | Admitting: Family Medicine

## 2021-05-15 ENCOUNTER — Other Ambulatory Visit: Payer: Self-pay | Admitting: Pharmacy Technician

## 2021-05-15 VITALS — BP 137/90 | HR 112 | Temp 98.1°F | Resp 20 | Ht 64.0 in | Wt 219.4 lb

## 2021-05-15 DIAGNOSIS — N390 Urinary tract infection, site not specified: Secondary | ICD-10-CM | POA: Diagnosis not present

## 2021-05-15 DIAGNOSIS — A499 Bacterial infection, unspecified: Secondary | ICD-10-CM

## 2021-05-15 DIAGNOSIS — Z87891 Personal history of nicotine dependence: Secondary | ICD-10-CM | POA: Insufficient documentation

## 2021-05-15 DIAGNOSIS — L500 Allergic urticaria: Secondary | ICD-10-CM | POA: Diagnosis present

## 2021-05-15 DIAGNOSIS — Z79899 Other long term (current) drug therapy: Secondary | ICD-10-CM | POA: Insufficient documentation

## 2021-05-15 DIAGNOSIS — T7840XA Allergy, unspecified, initial encounter: Secondary | ICD-10-CM

## 2021-05-15 DIAGNOSIS — R7989 Other specified abnormal findings of blood chemistry: Secondary | ICD-10-CM

## 2021-05-15 DIAGNOSIS — Z91040 Latex allergy status: Secondary | ICD-10-CM | POA: Diagnosis not present

## 2021-05-15 LAB — CBC
HCT: 43.9 % (ref 36.0–46.0)
Hemoglobin: 14.5 g/dL (ref 12.0–15.0)
MCH: 29.9 pg (ref 26.0–34.0)
MCHC: 33 g/dL (ref 30.0–36.0)
MCV: 90.5 fL (ref 80.0–100.0)
Platelets: 257 10*3/uL (ref 150–400)
RBC: 4.85 MIL/uL (ref 3.87–5.11)
RDW: 13.5 % (ref 11.5–15.5)
WBC: 9.8 10*3/uL (ref 4.0–10.5)
nRBC: 0.2 % (ref 0.0–0.2)

## 2021-05-15 LAB — CULTURE, URINE COMPREHENSIVE
MICRO NUMBER:: 12605111
SPECIMEN QUALITY:: ADEQUATE

## 2021-05-15 LAB — BASIC METABOLIC PANEL
Anion gap: 6 (ref 5–15)
BUN: 13 mg/dL (ref 6–20)
CO2: 22 mmol/L (ref 22–32)
Calcium: 8.7 mg/dL — ABNORMAL LOW (ref 8.9–10.3)
Chloride: 111 mmol/L (ref 98–111)
Creatinine, Ser: 0.83 mg/dL (ref 0.44–1.00)
GFR, Estimated: >60 mL/min (ref >60)
Glucose, Bld: 106 mg/dL — ABNORMAL HIGH (ref 70–99)
Potassium: 4 mmol/L (ref 3.5–5.1)
Sodium: 139 mmol/L (ref 135–145)

## 2021-05-15 LAB — TSH: TSH: 2.76 mIU/L (ref 0.40–4.50)

## 2021-05-15 MED ORDER — GENTAMICIN SULFATE 40 MG/ML IJ SOLN
600.0000 mg | INTRAVENOUS | Status: DC
  Administered 2021-05-15: 600 mg via INTRAVENOUS
  Filled 2021-05-15: qty 15

## 2021-05-15 MED ORDER — DIPHENHYDRAMINE HCL 50 MG/ML IJ SOLN
50.0000 mg | Freq: Once | INTRAMUSCULAR | Status: AC
  Administered 2021-05-15: 50 mg via INTRAVENOUS

## 2021-05-15 MED ORDER — EPINEPHRINE 0.3 MG/0.3ML IJ SOAJ: 0.3000 mg | INTRAMUSCULAR | 3 refills | Status: DC | PRN

## 2021-05-15 MED ORDER — METHYLPREDNISOLONE SODIUM SUCC 125 MG IJ SOLR
125.0000 mg | Freq: Once | INTRAMUSCULAR | Status: AC
  Administered 2021-05-15: 125 mg via INTRAVENOUS

## 2021-05-15 MED ORDER — GENTAMICIN SULFATE 40 MG/ML IJ SOLN
600.0000 mg | Freq: Once | INTRAMUSCULAR | Status: DC
Start: 2021-05-15 — End: 2021-05-15

## 2021-05-15 MED ORDER — DIPHENHYDRAMINE HCL 25 MG PO TABS: 50.0000 mg | ORAL_TABLET | Freq: Four times a day (QID) | ORAL | 0 refills | Status: DC

## 2021-05-15 MED ORDER — SODIUM CHLORIDE 0.9 % IV SOLN
Freq: Once | INTRAVENOUS | Status: AC
  Administered 2021-05-15: 500 mL via INTRAVENOUS

## 2021-05-15 MED ORDER — GENTAMICIN SULFATE 40 MG/ML IJ SOLN
600.0000 mg | Freq: Once | INTRAVENOUS | Status: DC
  Filled 2021-05-15: qty 15

## 2021-05-15 MED ORDER — NYSTATIN 100000 UNIT/GM EX POWD: 1.0000 "application " | Freq: Three times a day (TID) | CUTANEOUS | 0 refills | Status: AC

## 2021-05-15 MED ORDER — PREDNISONE 10 MG (21) PO TBPK: ORAL_TABLET | ORAL | 0 refills | Status: DC

## 2021-05-15 MED ORDER — LEVOTHYROXINE SODIUM 25 MCG PO TABS: 25.0000 ug | ORAL_TABLET | Freq: Every day | ORAL | 1 refills | Status: DC

## 2021-05-15 NOTE — ED Triage Notes (Signed)
Pt to ED [facility] for allergic rxn after receiving IV antibiotics for UTI, states started breaking out in hives.  Denies shob.  No hives noted.   Unsure name of antibiotic receiving

## 2021-05-15 NOTE — ED Provider Notes (Signed)
[facility] Emergency Department Provider Note  ____________________________________________  Time seen: Approximately 9:10 PM  I have reviewed the triage vital signs and the nursing notes.   HISTORY  Chief Complaint Allergic Reaction    HPI [name] is a 55 y.o. female with a history of migraines, GERD, anxiety, depression who comes ED complaining of hives.  Patient has multiple medication allergies including most antibiotics.  She was recently diagnosed with a recurrent UTI.  She received an IV gentamicin infusion today around 4:30 PM to treat this, but quickly broke out in hives and felt scratchiness in her throat.  She did not have wheezing vomiting diarrhea or crampy abdominal pain.  She was given IV Solu-Medrol and Benadryl in the clinic and brought to the ED for evaluation.  She currently feels better.  Hives have resolved.    Past Medical History:  Diagnosis Date   Allergy    Anxiety    Depression    GERD (gastroesophageal reflux disease)    History of kidney stones    Insomnia    Intermittent low back pain    Migraines    Osteoarthritis    Recurrent UTI    Restless leg syndrome    Sciatica of right side    left side   Symptomatic menopausal or female climacteric states    Vertigo    Vitamin D deficiency      Patient Active Problem List   Diagnosis Date Noted   Urinary tract infection 05/15/2021   Pelvic pain    Postmenopausal bleeding    Chronic pain syndrome 12/14/2019   Lumbar spondylosis 05/01/2019   Cervicalgia 05/01/2019   Polyp of sigmoid colon    GAD (generalized anxiety disorder) 02/28/2019   Panic disorder 02/28/2019   Insomnia due to mental condition 02/28/2019   Morbid obesity ([place]) 07/26/2017   Spondylolisthesis of lumbosacral region 02/11/2016   Right lumbar radiculitis 12/28/2014   MDD (major depressive disorder), recurrent episode, moderate ([place]) 12/27/2014   Gastro-esophageal reflux disease without  esophagitis 12/27/2014   Bulge of lumbar disc without myelopathy 00/92/3300   Dysmetabolic syndrome 76/22/6333   Migraine without aura and responsive to treatment 12/27/2014   Osteoarthrosis 12/27/2014   Restless leg 12/27/2014   Allergic rhinitis, seasonal 12/27/2014     Past Surgical History:  Procedure Laterality Date   ABDOMINAL HYSTERECTOMY     BACK SURGERY     COLONOSCOPY WITH PROPOFOL N/A 02/03/2019   Procedure: COLONOSCOPY WITH PROPOFOL;  Surgeon: Jonathon Bellows, MD;  Location: [facility] ENDOSCOPY;  Service: Gastroenterology;  Laterality: N/A;   COLONOSCOPY WITH PROPOFOL N/A 03/10/2019   Procedure: COLONOSCOPY WITH PROPOFOL;  Surgeon: Jonathon Bellows, MD;  Location: [facility] ENDOSCOPY;  Service: Gastroenterology;  Laterality: N/A;   CYSTOSCOPY N/A 06/25/2020   Procedure: CYSTOSCOPY;  Surgeon: Homero Fellers, MD;  Location: [facility] ORS;  Service: Gynecology;  Laterality: N/A;   DIAGNOSTIC LAPAROSCOPY     DILATION AND CURETTAGE OF UTERUS     HYSTEROSCOPY WITH D & C N/A 05/29/2020   Procedure: DILATATION AND CURETTAGE /HYSTEROSCOPY;  Surgeon: Homero Fellers, MD;  Location: [facility] ORS;  Service: Gynecology;  Laterality: N/A;   KNEE ARTHROSCOPY WITH MEDIAL MENISECTOMY Left 01/14/2017   Procedure: KNEE ARTHROSCOPY WITH MEDIAL AND LATERAL MENISECTOMY;  Surgeon: Hessie Knows, MD;  Location: [facility] ORS;  Service: Orthopedics;  Laterality: Left;  Partial Knee menisectomy   KNEE SURGERY Left 05/2011   Dr. Rudene Christians- Arthroscopic   LUMBAR LAMINECTOMY/DECOMPRESSION MICRODISCECTOMY N/A 09/16/2016   Procedure: LUMBAR  LAMINECTOMY/DECOMPRESSION MICRODISCECTOMY 1 LEVEL L5-S1;  Surgeon: Meade Maw, MD;  Location: [facility] ORS;  Service: Neurosurgery;  Laterality: N/A;   TONSILLECTOMY AND ADENOIDECTOMY     TOTAL LAPAROSCOPIC HYSTERECTOMY WITH SALPINGECTOMY Bilateral 06/25/2020   Procedure: TOTAL LAPAROSCOPIC HYSTERECTOMY WITH BILATERAL SALPINGECTOMY;  Surgeon: Homero Fellers, MD;  Location: [facility]  ORS;  Service: Gynecology;  Laterality: Bilateral;   URETHRAL STRICTURE DILATATION     WISDOM TOOTH EXTRACTION       Prior to Admission medications   Medication Sig Start Date End Date Taking? Authorizing Provider  diphenhydrAMINE (BENADRYL) 25 MG tablet Take 2 tablets (50 mg total) by mouth every 6 (six) hours for 3 days. 05/15/21 05/18/21 Yes Carrie Mew, MD  EPINEPHrine 0.3 mg/0.3 mL IJ SOAJ injection Inject 0.3 mg into the muscle as needed for anaphylaxis. Follow package instructions as needed for severe allergy or anaphylactic reaction. 05/15/21  Yes Carrie Mew, MD  nystatin (MYCOSTATIN/NYSTOP) powder Apply 1 application topically 3 (three) times daily. 05/15/21  Yes Carrie Mew, MD  predniSONE (STERAPRED UNI-PAK 21 TAB) 10 MG (21) TBPK tablet 6 tablets on day 1, then 5 tablets on day 2, then 4 tablets on day 3, then 3 tablets on day 4, then 2 tablets on day 5, then 1 tablet on day 6. 05/15/21  Yes Carrie Mew, MD  busPIRone (BUSPAR) 7.5 MG tablet Take 1 tablet (7.5 mg total) by mouth daily. 02/10/21   Ursula Alert, MD  gabapentin (NEURONTIN) 300 MG capsule Take 1-2 capsules (300-600 mg total) by mouth at bedtime. 12/26/20 03/26/21  Gillis Santa, MD  hydrOXYzine (ATARAX/VISTARIL) 25 MG tablet Take 1 tablet (25 mg total) by mouth 2 (two) times daily as needed. For anxiety attacks only 03/14/21   Ursula Alert, MD  Insulin Pen Needle 32G X 6 MM MISC 1 each by Does not apply route daily. 02/07/21   Steele Sizer, MD  levothyroxine (SYNTHROID) 25 MCG tablet Take 1-2 tablets (25-50 mcg total) by mouth daily. And two on T and T 05/15/21   Steele Sizer, MD  Liraglutide -Weight Management (SAXENDA) 18 MG/3ML SOPN Inject 0.6-3 mg into the skin daily. 11/05/20   Steele Sizer, MD  mirtazapine (REMERON) 7.5 MG tablet Take 1 tablet (7.5 mg total) by mouth at bedtime. For sleep and anxiety 02/10/21   Ursula Alert, MD  omeprazole (PRILOSEC) 40 MG capsule Take 1 capsule (40 mg  total) by mouth at bedtime. 11/05/20   Steele Sizer, MD  prazosin (MINIPRESS) 1 MG capsule Take 1 capsule (1 mg total) by mouth at bedtime. 02/10/21   Ursula Alert, MD  SUMAtriptan (IMITREX) 100 MG tablet Take 1 tablet (100 mg total) by mouth every 2 (two) hours as needed for migraine. May repeat in 2 hours if headache persists or recurs. 11/05/20   Steele Sizer, MD  topiramate (TOPAMAX) 100 MG tablet Take 1 tablet (100 mg total) by mouth every evening. 11/05/20   Steele Sizer, MD  venlafaxine XR (EFFEXOR-XR) 150 MG 24 hr capsule Take 1 capsule (150 mg total) by mouth every evening. Take with 75 mg to equal 225 mg daily 02/10/21   Ursula Alert, MD  venlafaxine XR (EFFEXOR-XR) 75 MG 24 hr capsule Take 1 capsule (75 mg total) by mouth every evening. Take with 150 mg to equal 225 mg daily 02/10/21   Ursula Alert, MD     Allergies Penicillins, Chlorhexidine gluconate, Ciprofloxacin, Clindamycin/lincomycin, Erythromycin, Keflex [cephalexin], Lyrica [pregabalin], Nitrofurantoin monohyd macro, Sulfa antibiotics, Tetracyclines & related, Adhesive [tape], Latex, and Vancomycin  Family History  Problem Relation Age of Onset   Dementia Father    Dementia Paternal Aunt    Cancer Paternal Grandmother    Anxiety disorder Cousin    Depression Cousin     Social History Social History   Tobacco Use   Smoking status: Former    Packs/day: 0.75    Years: 2.00    Pack years: 1.50    Types: Cigarettes    Start date: 07/06/1981    Quit date: 07/07/1983    Years since quitting: 37.8   Smokeless tobacco: Never  Vaping Use   Vaping Use: Never used  Substance Use Topics   Alcohol use: No    Alcohol/week: 0.0 standard drinks   Drug use: No    Review of Systems  Constitutional:   No fever or chills.  ENT:   No sore throat. No rhinorrhea. Cardiovascular:   No chest pain or syncope. Respiratory:   No dyspnea or cough. Gastrointestinal:   Negative for abdominal pain, vomiting and diarrhea.   Musculoskeletal:   Negative for focal pain or swelling All other systems reviewed and are negative except as documented above in ROS and HPI.  ____________________________________________   PHYSICAL EXAM:  VITAL SIGNS: ED Triage Vitals  Enc Vitals Group     BP 05/15/21 1833 120/72     Pulse Rate 05/15/21 1833 71     Resp 05/15/21 1833 20     Temp 05/15/21 1833 (!) 97.5 F (36.4 C)     Temp Source 05/15/21 1833 Oral     SpO2 05/15/21 1833 99 %     Weight 05/15/21 1834 220 lb 7.4 oz (100 kg)     Height 05/15/21 1834 5\' 4"  (1.626 m)     Head Circumference --      Peak Flow --      Pain Score 05/15/21 1834 0     Pain Loc --      Pain Edu? --      Excl. in [place]? --     Vital signs reviewed, nursing assessments reviewed.   Constitutional:   Alert and oriented. Non-toxic appearance. Eyes:   Conjunctivae are normal. EOMI. PERRL. ENT      Head:   Normocephalic and atraumatic.      Nose:   Normal      Mouth/Throat:   Normal, moist mucosa, no tongue swelling or elevation, no floor mouth edema, no oropharyngeal swelling.  Uvula appears normal.  Voice is strong, speaks in paragraphs..      Neck:   No meningismus. Full ROM. Hematological/Lymphatic/Immunilogical:   No cervical lymphadenopathy. Cardiovascular:   RRR. Symmetric bilateral radial and DP pulses.  No murmurs. Cap refill less than 2 seconds. Respiratory:   Normal respiratory effort without tachypnea/retractions. Breath sounds are clear and equal bilaterally. No wheezes/rales/rhonchi.  No stridor Gastrointestinal:   Soft and nontender. Non distended. There is no CVA tenderness.  No rebound, rigidity, or guarding. Genitourinary:   deferred Musculoskeletal:   Normal range of motion in all extremities. No joint effusions.  No lower extremity tenderness.  No edema. Neurologic:   Normal speech and language.  Motor grossly intact. No acute focal neurologic deficits are appreciated.  Skin:    Skin is warm, dry and intact. No rash  noted.  No petechiae, purpura, or bullae.  ____________________________________________    LABS (pertinent positives/negatives) (all labs ordered are listed, but only abnormal results are displayed) Labs Reviewed  BASIC METABOLIC PANEL - Abnormal; Notable for the following components:  Result Value   Glucose, Bld 106 (*)    Calcium 8.7 (*)    All other components within normal limits  CBC   ____________________________________________   EKG    ____________________________________________    RADIOLOGY  No results found.  ____________________________________________   PROCEDURES Procedures  ____________________________________________    CLINICAL IMPRESSION / ASSESSMENT AND PLAN / ED COURSE  Medications ordered in the ED: Medications - No data to display  Pertinent labs & imaging results that were available during my care of the patient were reviewed by me and considered in my medical decision making (see chart for details).  SADONNA KOTARA was evaluated in Emergency Department on 05/15/2021 for the symptoms described in the history of present illness. She was evaluated in the context of the global COVID-19 pandemic, which necessitated consideration that the patient might be at risk for infection with the SARS-CoV-2 virus that causes COVID-19. Institutional protocols and algorithms that pertain to the evaluation of patients at risk for COVID-19 are in a state of rapid change based on information released by regulatory bodies including the CDC and federal and state organizations. These policies and algorithms were followed during the patient's care in the ED.   Patient presents with an apparent allergic reaction to gentamicin.  Given her allergic reactions to most antibiotics, and the fact that she is nontoxic and overall well-appearing, not sick from the UTI, noting the most prudent thing for now is to monitor symptoms, wait to see if UTI resolves after the  single dose of gentamicin.  Discussed with patient further antibiotic options which I think are fosfomycin (she reports tolerating this once in the past), third-generation cephalosporin (she does have Keflex allergy which is minor and hopefully not too much cross-reactivity), or antibiotic with prednisone and Benadryl pretreatment.  Currently her allergy symptoms have resolved, she does not have anaphylaxis, she stable for discharge.  Benadryl prednisone EpiPen sent to her pharmacy.  Nystatin as well due to candidiasis under the breast.      ____________________________________________   FINAL CLINICAL IMPRESSION(S) / ED DIAGNOSES    Final diagnoses:  Allergic reaction, initial encounter     ED Discharge Orders          Ordered    diphenhydrAMINE (BENADRYL) 25 MG tablet  Every 6 hours        05/15/21 2109    EPINEPHrine 0.3 mg/0.3 mL IJ SOAJ injection  As needed        05/15/21 2109    predniSONE (STERAPRED UNI-PAK 21 TAB) 10 MG (21) TBPK tablet        05/15/21 2109    nystatin (MYCOSTATIN/NYSTOP) powder  3 times daily        05/15/21 2109            Portions of this note were generated with dragon dictation software. Dictation errors may occur despite best attempts at proofreading.    Carrie Mew, MD 05/15/21 2124

## 2021-05-15 NOTE — Discharge Instructions (Addendum)
Please follow-up with Dr. Ancil Boozer for repeat urine testing and symptom monitoring to ensure that your bladder infection was successfully cleared by the gentamicin infusion.  Drink lots of water to stay well-hydrated.

## 2021-05-15 NOTE — ED Notes (Signed)
ERMD at bedside with this pt . Pt  AAOx4, respi even-unlabored. In NAD . Family at bedside

## 2021-05-15 NOTE — Progress Notes (Signed)
Diagnosis: UTI  Provider:  Marshell Garfinkel, MD  Procedure: Infusion  IV Type: Peripheral, IV Location: R Antecubital  Garamycin, Dose: 600mg   Infusion Start Time: 1621  Infusion Stop Time: 1636   Post Infusion IV Care:  IV left in place. Patient transferred to ER via EMS.   Discharge: Condition: Stable, Destination: Patient was sent to ER via EMS  . AVS provided to patient.    Normal Saline, Dose: 53ml  Infusion Start Time: 1636  Infusion Stop Time: 1606  After 580ml NS infusion, Pt complained of tingling in lips and face/ flushing/chills. Dr. Sherley Bounds was contacted and advised. IV Benadryl 50mg  IV push was administered and 125mg  Solu-Medrol via IV push per Dr. Ancil Boozer. Patient still experiencing symptoms after Benadryl/Solu-Medrol. EMS was called and pt transported to ER.    Provider:  Marshell Garfinkel, MD   Performed by:  Yasmyn Bellisario, Sherlon Handing, LPN

## 2021-05-15 NOTE — ED Notes (Signed)
First  nurse- pt had reaction to garamycin. Pt had itching, hives to chest.  Solumedrol 125mg  given  benadryl 50 mg iv given at [place] clinic prior to transport.  Vs stable per [facility]. Pt alert.  Iv in place.

## 2021-05-16 ENCOUNTER — Telehealth (INDEPENDENT_AMBULATORY_CARE_PROVIDER_SITE_OTHER): Payer: Commercial Managed Care - PPO | Admitting: Psychiatry

## 2021-05-16 ENCOUNTER — Telehealth: Payer: Self-pay | Admitting: Pharmacy Technician

## 2021-05-16 ENCOUNTER — Encounter: Payer: Self-pay | Admitting: Psychiatry

## 2021-05-16 DIAGNOSIS — F410 Panic disorder [episodic paroxysmal anxiety] without agoraphobia: Secondary | ICD-10-CM

## 2021-05-16 DIAGNOSIS — Z634 Disappearance and death of family member: Secondary | ICD-10-CM

## 2021-05-16 DIAGNOSIS — F411 Generalized anxiety disorder: Secondary | ICD-10-CM | POA: Diagnosis not present

## 2021-05-16 DIAGNOSIS — G4701 Insomnia due to medical condition: Secondary | ICD-10-CM | POA: Diagnosis not present

## 2021-05-16 DIAGNOSIS — F3342 Major depressive disorder, recurrent, in full remission: Secondary | ICD-10-CM

## 2021-05-16 MED ORDER — VENLAFAXINE HCL ER 37.5 MG PO CP24: 37.5000 mg | ORAL_CAPSULE | Freq: Every day | ORAL | 0 refills | Status: DC

## 2021-05-16 MED ORDER — PRAZOSIN HCL 1 MG PO CAPS: 1.0000 mg | ORAL_CAPSULE | Freq: Every day | ORAL | 0 refills | Status: DC

## 2021-05-16 MED ORDER — VENLAFAXINE HCL ER 150 MG PO CP24
150.0000 mg | ORAL_CAPSULE | Freq: Every evening | ORAL | 0 refills | Status: DC
Start: 2021-05-16 — End: 2021-07-25

## 2021-05-16 MED ORDER — MIRTAZAPINE 15 MG PO TABS: 15.0000 mg | ORAL_TABLET | Freq: Every day | ORAL | 0 refills | Status: DC

## 2021-05-16 MED ORDER — BUSPIRONE HCL 7.5 MG PO TABS: 7.5000 mg | ORAL_TABLET | Freq: Every day | ORAL | 0 refills | Status: DC

## 2021-05-16 NOTE — Progress Notes (Signed)
Virtual Visit via Video Note  I connected with Kelly [name] on 05/16/21 at  8:30 AM EST by a video enabled telemedicine application and verified that I am speaking with the correct person using two identifiers. Location Provider Location : [facility] Patient Location : Home  Participants: Patient , Spouse, Provider    I discussed the limitations of evaluation and management by telemedicine and the availability of in person appointments. The patient expressed understanding and agreed to proceed.    I discussed the assessment and treatment plan with the patient. The patient was provided an opportunity to ask questions and all were answered. The patient agreed with the plan and demonstrated an understanding of the instructions.   The patient was advised to call back or seek an in-person evaluation if the symptoms worsen or if the condition fails to improve as anticipated.   [facility] MD OP Progress Note  05/16/2021 12:50 PM Kelly [name]  MRN:  [id number]  Chief Complaint:  Chief Complaint   Follow-up; Anxiety; Depression    HPI: Kelly [name] is a 55 year old Caucasian female who is married, lives in [place], has a history of MDD, GAD, panic disorder, insomnia, gastroesophageal reflux disease, lumbar disc problem, migraine headache was evaluated by telemedicine today.  Patient with recent emergency department visit-dated 05/15/2021-after receiving an IV gentamicin infusion, she developed hives, was given IV Solu-Medrol and Benadryl and was taken to the ED for evaluation, patient was discharged on prednisone, Benadryl and EpiPen.  Patient today reports her hives continues to clear up and she feels much better.  She however has not slept last night since likely due to being on a higher dosage of Benadryl as well as steroid.  Patient reports she is anxious about her current health however overall her mood symptoms especially anxiety is getting better.  She continues to follow-up  with her primary care provider, her thyroid abnormalities are currently getting better and that also likely could be helping her with her mood.  Patient denies any suicidality, homicidality or perceptual disturbances.  Patient denies any other concerns today.  Visit Diagnosis:    ICD-10-CM   1. MDD (major depressive disorder), recurrent, in full remission ([place])  F33.42 venlafaxine XR (EFFEXOR-XR) 37.5 MG 24 hr capsule    venlafaxine XR (EFFEXOR-XR) 150 MG 24 hr capsule    2. GAD (generalized anxiety disorder)  F41.1 venlafaxine XR (EFFEXOR-XR) 37.5 MG 24 hr capsule    venlafaxine XR (EFFEXOR-XR) 150 MG 24 hr capsule    busPIRone (BUSPAR) 7.5 MG tablet    3. Panic disorder  F41.0 venlafaxine XR (EFFEXOR-XR) 37.5 MG 24 hr capsule    venlafaxine XR (EFFEXOR-XR) 150 MG 24 hr capsule    busPIRone (BUSPAR) 7.5 MG tablet    4. Insomnia due to medical condition  G47.01 prazosin (MINIPRESS) 1 MG capsule    mirtazapine (REMERON) 15 MG tablet   thyroid problem, steroid therapy    5. Bereavement  Z63.4       Past Psychiatric History: Reviewed past psychiatric history from progress note on 10/25/2018.  Patient is currently on medications like Paxil, trazodone, hydroxyzine.  Patient completed IOP on 10/03/2019.  Past Medical History:  Past Medical History:  Diagnosis Date   Allergy    Anxiety    Depression    GERD (gastroesophageal reflux disease)    History of kidney stones    Insomnia    Intermittent low back pain    Migraines    Osteoarthritis    Recurrent UTI  Restless leg syndrome    Sciatica of right side    left side   Symptomatic menopausal or female climacteric states    Vertigo    Vitamin D deficiency     Past Surgical History:  Procedure Laterality Date   ABDOMINAL HYSTERECTOMY     BACK SURGERY     COLONOSCOPY WITH PROPOFOL N/A 02/03/2019   Procedure: COLONOSCOPY WITH PROPOFOL;  Surgeon: Jonathon Bellows, MD;  Location: [facility] ENDOSCOPY;  Service: Gastroenterology;   Laterality: N/A;   COLONOSCOPY WITH PROPOFOL N/A 03/10/2019   Procedure: COLONOSCOPY WITH PROPOFOL;  Surgeon: Jonathon Bellows, MD;  Location: [facility] ENDOSCOPY;  Service: Gastroenterology;  Laterality: N/A;   CYSTOSCOPY N/A 06/25/2020   Procedure: CYSTOSCOPY;  Surgeon: Homero Fellers, MD;  Location: [facility] ORS;  Service: Gynecology;  Laterality: N/A;   DIAGNOSTIC LAPAROSCOPY     DILATION AND CURETTAGE OF UTERUS     HYSTEROSCOPY WITH D & C N/A 05/29/2020   Procedure: DILATATION AND CURETTAGE /HYSTEROSCOPY;  Surgeon: Homero Fellers, MD;  Location: [facility] ORS;  Service: Gynecology;  Laterality: N/A;   KNEE ARTHROSCOPY WITH MEDIAL MENISECTOMY Left 01/14/2017   Procedure: KNEE ARTHROSCOPY WITH MEDIAL AND LATERAL MENISECTOMY;  Surgeon: Hessie Knows, MD;  Location: [facility] ORS;  Service: Orthopedics;  Laterality: Left;  Partial Knee menisectomy   KNEE SURGERY Left 05/2011   Dr. Rudene Christians- Arthroscopic   LUMBAR LAMINECTOMY/DECOMPRESSION MICRODISCECTOMY N/A 09/16/2016   Procedure: LUMBAR LAMINECTOMY/DECOMPRESSION MICRODISCECTOMY 1 LEVEL L5-S1;  Surgeon: Meade Maw, MD;  Location: [facility] ORS;  Service: Neurosurgery;  Laterality: N/A;   TONSILLECTOMY AND ADENOIDECTOMY     TOTAL LAPAROSCOPIC HYSTERECTOMY WITH SALPINGECTOMY Bilateral 06/25/2020   Procedure: TOTAL LAPAROSCOPIC HYSTERECTOMY WITH BILATERAL SALPINGECTOMY;  Surgeon: Homero Fellers, MD;  Location: [facility] ORS;  Service: Gynecology;  Laterality: Bilateral;   URETHRAL STRICTURE DILATATION     WISDOM TOOTH EXTRACTION      Family Psychiatric History: Reviewed family psychiatric history from progress note on 10/25/2018  Family History:  Family History  Problem Relation Age of Onset   Dementia Father    Dementia Paternal Aunt    Cancer Paternal Grandmother    Anxiety disorder Cousin    Depression Cousin     Social History: Reviewed social history from progress note on 10/25/2018 Social History   Socioeconomic History   Marital  status: Married    Spouse name: John   Number of children: 2   Years of education: 14   Highest education level: Associate degree: occupational, Hotel manager, or vocational program  Occupational History   Occupation: not employed  Tobacco Use   Smoking status: Former    Packs/day: 0.75    Years: 2.00    Pack years: 1.50    Types: Cigarettes    Start date: 07/06/1981    Quit date: 07/07/1983    Years since quitting: 37.8   Smokeless tobacco: Never  Vaping Use   Vaping Use: Never used  Substance and Sexual Activity   Alcohol use: No    Alcohol/week: 0.0 standard drinks   Drug use: No   Sexual activity: Yes    Partners: Male  Other Topics Concern   Not on file  Social History Narrative   Married and husband has a lot of medical problems, he is working again    Investment banker, operational of Radio broadcast assistant Strain: Not on file  Food Insecurity: Not on file  Transportation Needs: Not on file  Physical Activity: Not on file  Stress: Not on file  Social  Connections: Not on file    Allergies:  Allergies  Allergen Reactions   Penicillins Anaphylaxis, Hives, Nausea And Vomiting and Swelling    Has patient had a PCN reaction causing immediate rash, facial/tongue/throat swelling, SOB or lightheadedness with hypotension: Yes Has patient had a PCN reaction causing severe rash involving mucus membranes or skin necrosis: Yes Has patient had a PCN reaction that required hospitalization No Has patient had a PCN reaction occurring within the last 10 years: Yes If all of the above answers are "NO", then may proceed with Cephalosporin use.    Chlorhexidine Gluconate Itching and Rash   Ciprofloxacin Hives   Clindamycin/Lincomycin Hives   Erythromycin Hives and Nausea And Vomiting   Keflex [Cephalexin] Hives   Lyrica [Pregabalin]     Dizziness, syncope   Nitrofurantoin Monohyd Macro Hives and Nausea And Vomiting   Sulfa Antibiotics Hives and Nausea And Vomiting    Rapid heart rate    Tetracyclines & Related Hives   Adhesive [Tape] Rash   Latex Rash   Vancomycin Itching and Rash    Metabolic Disorder Labs: Lab Results  Component Value Date   HGBA1C 5.6 03/21/2020   MPG 114 03/21/2020   MPG 105 03/31/2019   No results found for: PROLACTIN Lab Results  Component Value Date   CHOL 179 03/21/2020   TRIG 146 03/21/2020   HDL 59 03/21/2020   CHOLHDL 3.0 03/21/2020   VLDL 22 12/31/2016   LDLCALC 95 03/21/2020   LDLCALC 82 06/17/2018   Lab Results  Component Value Date   TSH 2.76 05/12/2021   TSH 4.51 (H) 04/10/2021    Therapeutic Level Labs: No results found for: LITHIUM No results found for: VALPROATE No components found for:  CBMZ  Current Medications: Current Outpatient Medications  Medication Sig Dispense Refill   mirtazapine (REMERON) 15 MG tablet Take 1 tablet (15 mg total) by mouth at bedtime. Dose increase 90 tablet 0   venlafaxine XR (EFFEXOR-XR) 37.5 MG 24 hr capsule Take 1 capsule (37.5 mg total) by mouth daily with breakfast. Take along with 150 mg daily 90 capsule 0   busPIRone (BUSPAR) 7.5 MG tablet Take 1 tablet (7.5 mg total) by mouth daily. 90 tablet 0   diphenhydrAMINE (BENADRYL) 25 MG tablet Take 2 tablets (50 mg total) by mouth every 6 (six) hours for 3 days. 30 tablet 0   EPINEPHrine 0.3 mg/0.3 mL IJ SOAJ injection Inject 0.3 mg into the muscle as needed for anaphylaxis. Follow package instructions as needed for severe allergy or anaphylactic reaction. 2 each 3   gabapentin (NEURONTIN) 300 MG capsule Take 1-2 capsules (300-600 mg total) by mouth at bedtime. 60 capsule 2   hydrOXYzine (ATARAX/VISTARIL) 25 MG tablet Take 1 tablet (25 mg total) by mouth 2 (two) times daily as needed. For anxiety attacks only 180 tablet 0   Insulin Pen Needle 32G X 6 MM MISC 1 each by Does not apply route daily. 100 each 1   levothyroxine (SYNTHROID) 25 MCG tablet Take 1-2 tablets (25-50 mcg total) by mouth daily. And two on T and T 38 tablet 1    Liraglutide -Weight Management (SAXENDA) 18 MG/3ML SOPN Inject 0.6-3 mg into the skin daily. 15 mL 1   nystatin (MYCOSTATIN/NYSTOP) powder Apply 1 application topically 3 (three) times daily. 60 g 0   omeprazole (PRILOSEC) 40 MG capsule Take 1 capsule (40 mg total) by mouth at bedtime. 90 capsule 1   prazosin (MINIPRESS) 1 MG capsule Take 1 capsule (1 mg  total) by mouth at bedtime. 90 capsule 0   predniSONE (STERAPRED UNI-PAK 21 TAB) 10 MG (21) TBPK tablet 6 tablets on day 1, then 5 tablets on day 2, then 4 tablets on day 3, then 3 tablets on day 4, then 2 tablets on day 5, then 1 tablet on day 6. 21 tablet 0   SUMAtriptan (IMITREX) 100 MG tablet Take 1 tablet (100 mg total) by mouth every 2 (two) hours as needed for migraine. May repeat in 2 hours if headache persists or recurs. 10 tablet 0   topiramate (TOPAMAX) 100 MG tablet Take 1 tablet (100 mg total) by mouth every evening. 90 tablet 1   venlafaxine XR (EFFEXOR-XR) 150 MG 24 hr capsule Take 1 capsule (150 mg total) by mouth every evening. Take with 37.5 mg mg to equal 187.5 mg daily 90 capsule 0   No current facility-administered medications for this visit.     Musculoskeletal: Strength & Muscle Tone:  UTA Gait & Station: normal Patient leans: N/A  Psychiatric Specialty Exam: Review of Systems  Skin:        Hives all over face , neck   Psychiatric/Behavioral:  Positive for sleep disturbance. The patient is nervous/anxious.   All other systems reviewed and are negative.  Last menstrual period 03/07/2020.There is no height or weight on file to calculate BMI.  General Appearance: Casual  Eye Contact:  Good  Speech:  Clear and Coherent  Volume:  Normal  Mood:  Anxious  Affect:  Congruent  Thought Process:  Goal Directed and Descriptions of Associations: Intact  Orientation:  Full (Time, Place, and Person)  Thought Content: Logical   Suicidal Thoughts:  No  Homicidal Thoughts:  No  Memory:  Immediate;   Fair Recent;    Fair Remote;   Fair  Judgement:  Fair  Insight:  Fair  Psychomotor Activity:  Normal  Concentration:  Concentration: Fair and Attention Span: Fair  Recall:  [employer] of Knowledge: Fair  Language: Fair  Akathisia:  No  Handed:  Right  AIMS (if indicated): done,0  Assets:  Communication Skills Desire for Improvement Housing Intimacy Social Support Transportation  ADL's:  Intact  Cognition: WNL  Sleep:  Poor   Screenings: GAD-7    Flowsheet Row Office Visit from 05/27/2020 in [facility] Office Visit from 04/12/2020 in [facility] Office Visit from 10/12/2019 in [facility] Office Visit from 06/20/2019 in [facility] Office Visit from 03/23/2019 in [facility]  Total GAD-7 Score 8 4 15 2 1       PHQ2-9    [place] Office Visit from 05/12/2021 in [facility] Office Visit from 03/14/2021 in [place] Video Visit from 02/10/2021 in [place] Office Visit from 02/07/2021 in [facility] Office Visit from 12/26/2020 in [place]  PHQ-2 Total Score 1 2 2 2  0  PHQ-9 Total Score 8 8 8 5  --      Flowsheet Row ED from 05/15/2021 in [place] Video Visit from 02/10/2021 in [place]  C-SSRS RISK CATEGORY No Risk Low Risk        Assessment and Plan: Kelly [name] is a 55 year old female who has a history of MDD, insomnia, arthritis, GAD was evaluated by telemedicine today.  Patient with recent allergic reaction to gentamicin currently on prednisone, Benadryl, does report sleep problems  otherwise mood symptoms improving.  Plan as noted below.  Plan MDD in remission We will reduce venlafaxine to 187.5 mg p.o. daily Increase mirtazapine to address her  sleep.   GAD-improving Hydroxyzine 25 mg p.o. twice daily as needed for severe anxiety-not to combine with Benadryl. Reduce venlafaxine as noted above.  Insomnia-unstable Likely due to being on steroid therapy Increase mirtazapine to 15 mg p.o. nightly Prazosin 1 mg p.o. nightly as needed   Bereavement-improving Continue grief counseling  Reviewed notes per emergency department dated 05/15/2021-as noted above.  Follow-up in clinic in 4 weeks or sooner if needed.  This note was generated in part or whole with voice recognition software. Voice recognition is usually quite accurate but there are transcription errors that can and very often do occur. I apologize for any typographical errors that were not detected and corrected.      Ursula Alert, MD 05/16/2021, 12:50 PM

## 2021-05-16 NOTE — Telephone Encounter (Signed)
Auth Submission: no auth needed Payer: [employer] Med[employer] & CPT/J Code(s) submitted:  gentamicin Route of submission (phone, fax, portal): phone Auth type: Buy/Bill Units/visits requested: 1 Reference number: [id number] Phone: [phone]

## 2021-05-21 ENCOUNTER — Other Ambulatory Visit: Payer: Self-pay | Admitting: Pharmacy Technician

## 2021-06-13 ENCOUNTER — Other Ambulatory Visit: Payer: Self-pay

## 2021-06-13 ENCOUNTER — Telehealth (INDEPENDENT_AMBULATORY_CARE_PROVIDER_SITE_OTHER): Payer: Commercial Managed Care - PPO | Admitting: Psychiatry

## 2021-06-13 ENCOUNTER — Encounter: Payer: Self-pay | Admitting: Psychiatry

## 2021-06-13 DIAGNOSIS — G4701 Insomnia due to medical condition: Secondary | ICD-10-CM | POA: Diagnosis not present

## 2021-06-13 DIAGNOSIS — Z634 Disappearance and death of family member: Secondary | ICD-10-CM

## 2021-06-13 DIAGNOSIS — F410 Panic disorder [episodic paroxysmal anxiety] without agoraphobia: Secondary | ICD-10-CM | POA: Diagnosis not present

## 2021-06-13 DIAGNOSIS — F411 Generalized anxiety disorder: Secondary | ICD-10-CM

## 2021-06-13 DIAGNOSIS — F3342 Major depressive disorder, recurrent, in full remission: Secondary | ICD-10-CM

## 2021-06-13 NOTE — Progress Notes (Signed)
Virtual Visit via Video Note  I connected with Kelly [name] on 06/13/21 at 10:40 AM EST by a video enabled telemedicine application and verified that I am speaking with the correct person using two identifiers.  Location Provider Location : [facility] Patient Location : Home  Participants: Patient , Provider    I discussed the limitations of evaluation and management by telemedicine and the availability of in person appointments. The patient expressed understanding and agreed to proceed.    I discussed the assessment and treatment plan with the patient. The patient was provided an opportunity to ask questions and all were answered. The patient agreed with the plan and demonstrated an understanding of the instructions.   The patient was advised to call back or seek an in-person evaluation if the symptoms worsen or if the condition fails to improve as anticipated.    [facility] MD OP Progress Note  06/13/2021 12:23 PM Kelly [name]  MRN:  [id number]  Chief Complaint:  Chief Complaint   Follow-up; Anxiety; Depression    HPI: Kelly [name] is a 55 year old Caucasian female who is married, lives in [place], has a history of MDD, GAD, panic disorder, insomnia, gastroesophageal reflux disease, lumbar disc problem, migraine headache was evaluated by telemedicine today.  Patient today reports she had a good Thanksgiving holiday with her family.  She reports by the next week she crashed however she was able to cope and get through it.  Overall her mood symptoms are stable.  Denies any significant depression or anxiety symptoms.  Patient continues to have trauma related nightmares however they are less frequent and less intense.  The prazosin does help.  Patient continues to be in psychotherapy session with her therapist.  Patient reports she is worried about her husband who may be struggling with his own medical problems.  However she has been trying to ask him to get help for  himself.  Patient denies any suicidality, homicidality or perceptual disturbances.  Patient denies any other concerns today.    Visit Diagnosis:    ICD-10-CM   1. MDD (major depressive disorder), recurrent, in full remission ([place])  F33.42     2. GAD (generalized anxiety disorder)  F41.1     3. Panic disorder  F41.0     4. Insomnia due to medical condition  G47.01    Thyroid problem    5. Bereavement  Z63.4       Past Psychiatric History: Reviewed past psychiatric history from progress note on 10/25/2018.  Patient is currently on medications like Paxil, trazodone, hydroxyzine.  Patient completed IOP on 10/03/2019.  Past Medical History:  Past Medical History:  Diagnosis Date   Allergy    Anxiety    Depression    GERD (gastroesophageal reflux disease)    History of kidney stones    Insomnia    Intermittent low back pain    Migraines    Osteoarthritis    Recurrent UTI    Restless leg syndrome    Sciatica of right side    left side   Symptomatic menopausal or female climacteric states    Vertigo    Vitamin D deficiency     Past Surgical History:  Procedure Laterality Date   ABDOMINAL HYSTERECTOMY     BACK SURGERY     COLONOSCOPY WITH PROPOFOL N/A 02/03/2019   Procedure: COLONOSCOPY WITH PROPOFOL;  Surgeon: Jonathon Bellows, MD;  Location: [facility] ENDOSCOPY;  Service: Gastroenterology;  Laterality: N/A;   COLONOSCOPY WITH PROPOFOL N/A 03/10/2019  Procedure: COLONOSCOPY WITH PROPOFOL;  Surgeon: Jonathon Bellows, MD;  Location: [facility] ENDOSCOPY;  Service: Gastroenterology;  Laterality: N/A;   CYSTOSCOPY N/A 06/25/2020   Procedure: CYSTOSCOPY;  Surgeon: Homero Fellers, MD;  Location: [facility] ORS;  Service: Gynecology;  Laterality: N/A;   DIAGNOSTIC LAPAROSCOPY     DILATION AND CURETTAGE OF UTERUS     HYSTEROSCOPY WITH D & C N/A 05/29/2020   Procedure: DILATATION AND CURETTAGE /HYSTEROSCOPY;  Surgeon: Homero Fellers, MD;  Location: [facility] ORS;  Service: Gynecology;   Laterality: N/A;   KNEE ARTHROSCOPY WITH MEDIAL MENISECTOMY Left 01/14/2017   Procedure: KNEE ARTHROSCOPY WITH MEDIAL AND LATERAL MENISECTOMY;  Surgeon: Hessie Knows, MD;  Location: [facility] ORS;  Service: Orthopedics;  Laterality: Left;  Partial Knee menisectomy   KNEE SURGERY Left 05/2011   Dr. Rudene Christians- Arthroscopic   LUMBAR LAMINECTOMY/DECOMPRESSION MICRODISCECTOMY N/A 09/16/2016   Procedure: LUMBAR LAMINECTOMY/DECOMPRESSION MICRODISCECTOMY 1 LEVEL L5-S1;  Surgeon: Meade Maw, MD;  Location: [facility] ORS;  Service: Neurosurgery;  Laterality: N/A;   TONSILLECTOMY AND ADENOIDECTOMY     TOTAL LAPAROSCOPIC HYSTERECTOMY WITH SALPINGECTOMY Bilateral 06/25/2020   Procedure: TOTAL LAPAROSCOPIC HYSTERECTOMY WITH BILATERAL SALPINGECTOMY;  Surgeon: Homero Fellers, MD;  Location: [facility] ORS;  Service: Gynecology;  Laterality: Bilateral;   URETHRAL STRICTURE DILATATION     WISDOM TOOTH EXTRACTION      Family Psychiatric History: Reviewed family psychiatric history from progress note on 10/25/2018  Family History:  Family History  Problem Relation Age of Onset   Dementia Father    Dementia Paternal Aunt    Cancer Paternal Grandmother    Anxiety disorder Cousin    Depression Cousin     Social History: Reviewed social history from progress note on 10/25/2018 Social History   Socioeconomic History   Marital status: Married    Spouse name: Kelly [name]   Number of children: 2   Years of education: 14   Highest education level: Associate degree: occupational, Hotel manager, or vocational program  Occupational History   Occupation: not employed  Tobacco Use   Smoking status: Former    Packs/day: 0.75    Years: 2.00    Pack years: 1.50    Types: Cigarettes    Start date: 07/06/1981    Quit date: 07/07/1983    Years since quitting: 37.9   Smokeless tobacco: Never  Vaping Use   Vaping Use: Never used  Substance and Sexual Activity   Alcohol use: No    Alcohol/week: 0.0 standard drinks   Drug use: No    Sexual activity: Yes    Partners: Male  Other Topics Concern   Not on file  Social History Narrative   Married and husband has a lot of medical problems, he is working again    Investment banker, operational of Radio broadcast assistant Strain: Not on file  Food Insecurity: Not on file  Transportation Needs: Not on file  Physical Activity: Not on file  Stress: Not on file  Social Connections: Not on file    Allergies:  Allergies  Allergen Reactions   Penicillins Anaphylaxis, Hives, Nausea And Vomiting and Swelling    Has patient had a PCN reaction causing immediate rash, facial/tongue/throat swelling, SOB or lightheadedness with hypotension: Yes Has patient had a PCN reaction causing severe rash involving mucus membranes or skin necrosis: Yes Has patient had a PCN reaction that required hospitalization No Has patient had a PCN reaction occurring within the last 10 years: Yes If all of the above answers are "NO", then may proceed  with Cephalosporin use.    Chlorhexidine Gluconate Itching and Rash   Ciprofloxacin Hives   Clindamycin/Lincomycin Hives   Erythromycin Hives and Nausea And Vomiting   Keflex [Cephalexin] Hives   Lyrica [Pregabalin]     Dizziness, syncope   Nitrofurantoin Monohyd Macro Hives and Nausea And Vomiting   Sulfa Antibiotics Hives and Nausea And Vomiting    Rapid heart rate   Tetracyclines & Related Hives   Adhesive [Tape] Rash   Latex Rash   Vancomycin Itching and Rash    Metabolic Disorder Labs: Lab Results  Component Value Date   HGBA1C 5.6 03/21/2020   MPG 114 03/21/2020   MPG 105 03/31/2019   No results found for: PROLACTIN Lab Results  Component Value Date   CHOL 179 03/21/2020   TRIG 146 03/21/2020   HDL 59 03/21/2020   CHOLHDL 3.0 03/21/2020   VLDL 22 12/31/2016   LDLCALC 95 03/21/2020   LDLCALC 82 06/17/2018   Lab Results  Component Value Date   TSH 2.76 05/12/2021   TSH 4.51 (H) 04/10/2021    Therapeutic Level Labs: No results  found for: LITHIUM No results found for: VALPROATE No components found for:  CBMZ  Current Medications: Current Outpatient Medications  Medication Sig Dispense Refill   busPIRone (BUSPAR) 7.5 MG tablet Take 1 tablet (7.5 mg total) by mouth daily. 90 tablet 0   diphenhydrAMINE (BENADRYL) 25 MG tablet Take 2 tablets (50 mg total) by mouth every 6 (six) hours for 3 days. 30 tablet 0   EPINEPHrine 0.3 mg/0.3 mL IJ SOAJ injection Inject 0.3 mg into the muscle as needed for anaphylaxis. Follow package instructions as needed for severe allergy or anaphylactic reaction. 2 each 3   gabapentin (NEURONTIN) 300 MG capsule Take 1-2 capsules (300-600 mg total) by mouth at bedtime. 60 capsule 2   hydrOXYzine (ATARAX/VISTARIL) 25 MG tablet Take 1 tablet (25 mg total) by mouth 2 (two) times daily as needed. For anxiety attacks only 180 tablet 0   Insulin Pen Needle 32G X 6 MM MISC 1 each by Does not apply route daily. 100 each 1   levothyroxine (SYNTHROID) 25 MCG tablet Take 1-2 tablets (25-50 mcg total) by mouth daily. And two on T and T 38 tablet 1   Liraglutide -Weight Management (SAXENDA) 18 MG/3ML SOPN Inject 0.6-3 mg into the skin daily. 15 mL 1   mirtazapine (REMERON) 15 MG tablet Take 1 tablet (15 mg total) by mouth at bedtime. Dose increase 90 tablet 0   nystatin (MYCOSTATIN/NYSTOP) powder Apply 1 application topically 3 (three) times daily. 60 g 0   omeprazole (PRILOSEC) 40 MG capsule Take 1 capsule (40 mg total) by mouth at bedtime. 90 capsule 1   prazosin (MINIPRESS) 1 MG capsule Take 1 capsule (1 mg total) by mouth at bedtime. 90 capsule 0   predniSONE (STERAPRED UNI-PAK 21 TAB) 10 MG (21) TBPK tablet 6 tablets on day 1, then 5 tablets on day 2, then 4 tablets on day 3, then 3 tablets on day 4, then 2 tablets on day 5, then 1 tablet on day 6. 21 tablet 0   SUMAtriptan (IMITREX) 100 MG tablet Take 1 tablet (100 mg total) by mouth every 2 (two) hours as needed for migraine. May repeat in 2 hours if  headache persists or recurs. 10 tablet 0   topiramate (TOPAMAX) 100 MG tablet Take 1 tablet (100 mg total) by mouth every evening. 90 tablet 1   venlafaxine XR (EFFEXOR-XR) 150 MG 24 hr  capsule Take 1 capsule (150 mg total) by mouth every evening. Take with 37.5 mg mg to equal 187.5 mg daily 90 capsule 0   venlafaxine XR (EFFEXOR-XR) 37.5 MG 24 hr capsule Take 1 capsule (37.5 mg total) by mouth daily with breakfast. Take along with 150 mg daily 90 capsule 0   No current facility-administered medications for this visit.     Musculoskeletal: Strength & Muscle Tone:  UTA Gait & Station:  Seated Patient leans: N/A  Psychiatric Specialty Exam: Review of Systems  Psychiatric/Behavioral:  The patient is nervous/anxious.   All other systems reviewed and are negative.  Last menstrual period 03/07/2020.There is no height or weight on file to calculate BMI.  General Appearance: Casual  Eye Contact:  Fair  Speech:  Clear and Coherent  Volume:  Normal  Mood:  Anxious coping well  Affect:  Congruent  Thought Process:  Goal Directed and Descriptions of Associations: Intact  Orientation:  Full (Time, Place, and Person)  Thought Content: Logical   Suicidal Thoughts:  No  Homicidal Thoughts:  No  Memory:  Immediate;   Fair Recent;   Fair Remote;   Fair  Judgement:  Fair  Insight:  Fair  Psychomotor Activity:  Normal  Concentration:  Concentration: Fair and Attention Span: Fair  Recall:  [employer] of Knowledge: Fair  Language: Fair  Akathisia:  No  Handed:  Right  AIMS (if indicated): done, 0  Assets:  Communication Skills Desire for Improvement Housing Social Support  ADL's:  Intact  Cognition: WNL  Sleep:  Fair   Screenings: GAD-7    Flowsheet Row Video Visit from 06/13/2021 in [facility] Office Visit from 05/27/2020 in [facility] Office Visit from 04/12/2020 in [facility] Office Visit from 10/12/2019 in [facility] Office Visit from 06/20/2019 in [facility]  Total GAD-7 Score 6 8 4 15 2       PHQ2-9    Flowsheet Row Video Visit from 06/13/2021 in [place] Office Visit from 05/12/2021 in [facility] Office Visit from 03/14/2021 in [facility] Video Visit from 02/10/2021 in [place] Visit from 02/07/2021 in [facility]  PHQ-2 Total Score 1 1 2 2 2   PHQ-9 Total Score -- 8 8 8 5       Flowsheet Row ED from 05/15/2021 in [facility] Video Visit from 02/10/2021 in [facility]  C-SSRS RISK CATEGORY No Risk Low Risk        Assessment and Plan: Kelly [name] is a 55 year old female who has a history of MDD, insomnia, arthritis, GAD was evaluated by telemedicine today.  Patient is currently stable.  Plan as noted below.  Plan MDD in remission Venlafaxine 187.5 mg p.o. daily Mirtazapine as prescribed  GAD-stable Hydroxyzine 25 mg p.o. twice daily as needed for severe anxiety Not to combine with Benadryl Venlafaxine at reduced dosage of 187.5 mg p.o. daily  Insomnia-stable Mirtazapine 15 mg p.o. nightly Prazosin 1 mg p.o. nightly as needed  Bereavement-improving Continue grief counseling.  She follows up with her therapist Ms. Marjie Skiff.  Follow-up in clinic in 8 weeks or sooner if needed.  This note was generated in part or whole with voice recognition software. Voice recognition is usually quite accurate but there are transcription errors that can and very often do occur. I apologize for any typographical errors that were not  detected and corrected.     Ursula Alert, MD 06/13/2021, 12:23 PM

## 2021-06-26 ENCOUNTER — Ambulatory Visit
Payer: Commercial Managed Care - PPO | Attending: Student in an Organized Health Care Education/Training Program | Admitting: Student in an Organized Health Care Education/Training Program

## 2021-06-26 ENCOUNTER — Other Ambulatory Visit: Payer: Self-pay

## 2021-06-26 ENCOUNTER — Encounter: Payer: Self-pay | Admitting: Student in an Organized Health Care Education/Training Program

## 2021-06-26 VITALS — BP 144/87 | HR 93 | Temp 97.2°F | Resp 16 | Ht 64.0 in | Wt 220.0 lb

## 2021-06-26 DIAGNOSIS — M47816 Spondylosis without myelopathy or radiculopathy, lumbar region: Secondary | ICD-10-CM | POA: Diagnosis present

## 2021-06-26 DIAGNOSIS — G894 Chronic pain syndrome: Secondary | ICD-10-CM

## 2021-06-26 DIAGNOSIS — M4317 Spondylolisthesis, lumbosacral region: Secondary | ICD-10-CM

## 2021-06-26 MED ORDER — GABAPENTIN 300 MG PO CAPS: 300.0000 mg | ORAL_CAPSULE | Freq: Every day | ORAL | 5 refills | Status: DC

## 2021-06-26 NOTE — Progress Notes (Signed)
PROVIDER NOTE: Information contained herein reflects review and annotations entered in association with encounter. Interpretation of such information and data should be left to medically-trained personnel. Information provided to patient can be located elsewhere in the medical record under "Patient Instructions". Document created using STT-dictation technology, any transcriptional errors that may result from process are unintentional.    Patient: Kelly [name]  Service Category: E/M  Provider: Gillis Santa, MD  DOB: Apr 15, 1966  DOS: 06/26/2021  Specialty: Interventional Pain Management  MRN: [id number]  Setting: Ambulatory outpatient  PCP: Steele Sizer, MD  Type: Established Patient    Referring Provider: Steele Sizer, MD  Location: Office  Delivery: Face-to-face     HPI  [name], a 55 y.o. year old female, is here today because of her Lumbar spondylosis [M47.816]. [name] primary complain today is Hip Pain (left)   Pertinent problems: [name] has MDD (major depressive disorder), recurrent episode, moderate ([place]); Restless leg; Allergic rhinitis, seasonal; Spondylolisthesis of lumbosacral region; GAD (generalized anxiety disorder); Lumbar spondylosis; and Chronic pain syndrome on their pertinent problem list. Pain Assessment: Severity of Chronic pain is reported as a 10-Worst pain ever/10. Location: Hip Left/left leg to the foot. Onset: More than a month ago. Quality: Burning, Squeezing, Tingling. Timing: Constant. Modifying factor(s): nothing. Vitals:  height is '5\' 4"'  (1.626 m) and weight is 220 lb (99.8 kg). Her temporal temperature is 97.2 F (36.2 C) (abnormal). Her blood pressure is 144/87 (abnormal) and her pulse is 93. Her respiration is 16 and oxygen saturation is 100%.   Reason for encounter:   Last visit with me was on 12/26/20.   She is accompanied today by her father.  She is complaining of low back pain as well as left hip and buttock pain as well.   She has a history of lumbar spondylolisthesis and lumbar spondylosis.  Previous lumbar facet blocks were November 2021.  Patient is is interested in repeating these.  She is also in need of a gabapentin refill.   We also discussed spinal cord stimulation as the patient does have a history of L5-S1 laminectomy, microdiscectomy and symptoms of failed back surgical syndrome, postlaminectomy pain syndrome.  ROS  Constitutional: Denies any fever or chills Gastrointestinal: No reported hemesis, hematochezia, vomiting, or acute GI distress Musculoskeletal:  left hip, low back, right knee pain  Neurological: No reported episodes of acute onset apraxia, aphasia, dysarthria, agnosia, amnesia, paralysis, loss of coordination, or loss of consciousness  Medication Review  EPINEPHrine, Insulin Pen Needle, Liraglutide -Weight Management, SUMAtriptan, busPIRone, gabapentin, hydrOXYzine, levothyroxine, mirtazapine, nystatin, omeprazole, prazosin, topiramate, and venlafaxine XR  History Review  Allergy: [name] is allergic to penicillins, chlorhexidine gluconate, ciprofloxacin, clindamycin/lincomycin, erythromycin, gentamicin, keflex [cephalexin], lyrica [pregabalin], nitrofurantoin monohyd macro, sulfa antibiotics, tetracyclines & related, adhesive [tape], latex, and vancomycin. Drug: [name]  reports no history of drug use. Alcohol:  reports no history of alcohol use. Tobacco:  reports that she quit smoking about 37 years ago. Her smoking use included cigarettes. She started smoking about 40 years ago. She has a 1.50 pack-year smoking history. She has never used smokeless tobacco. Social: [name]  reports that she quit smoking about 37 years ago. Her smoking use included cigarettes. She started smoking about 40 years ago. She has a 1.50 pack-year smoking history. She has never used smokeless tobacco. She reports that she does not drink alcohol and does not use drugs. Medical:  has a past medical  history of Allergy, Anxiety, Depression, GERD (gastroesophageal reflux  disease), History of kidney stones, Insomnia, Intermittent low back pain, Migraines, Osteoarthritis, Recurrent UTI, Restless leg syndrome, Sciatica of right side, Symptomatic menopausal or female climacteric states, Vertigo, and Vitamin D deficiency. Surgical: [name]  has a past surgical history that includes Tonsillectomy and adenoidectomy; Urethra dilation; Knee surgery (Left, 05/2011); Lumbar laminectomy/decompression microdiscectomy (N/A, 09/16/2016); Back surgery; Diagnostic laparoscopy; Dilation and curettage of uterus; Knee arthroscopy with medial menisectomy (Left, 01/14/2017); Colonoscopy with propofol (N/A, 02/03/2019); Colonoscopy with propofol (N/A, 03/10/2019); Hysteroscopy with D & C (N/A, 05/29/2020); Wisdom tooth extraction; Total laparoscopic hysterectomy with salpingectomy (Bilateral, 06/25/2020); Cystoscopy (N/A, 06/25/2020); and Abdominal hysterectomy. Family: family history includes Anxiety disorder in her cousin; Cancer in her paternal grandmother; Dementia in her father and paternal aunt; Depression in her cousin.  Laboratory Chemistry Profile   Renal Lab Results  Component Value Date   BUN 13 05/15/2021   CREATININE 0.83 46/80/3212   BCR NOT APPLICABLE 24/82/5003   GFRAA 69 12/26/2020   GFRNONAA >60 05/15/2021     Hepatic Lab Results  Component Value Date   AST 19 12/26/2020   ALT 21 12/26/2020   ALBUMIN 4.1 05/22/2020   ALKPHOS 63 05/22/2020   LIPASE 23 05/22/2020     Electrolytes Lab Results  Component Value Date   NA 139 05/15/2021   K 4.0 05/15/2021   CL 111 05/15/2021   CALCIUM 8.7 (L) 05/15/2021     Bone Lab Results  Component Value Date   VD25OH 37 03/31/2019     Inflammation (CRP: Acute Phase) (ESR: Chronic Phase) No results found for: CRP, ESRSEDRATE, LATICACIDVEN     Note: Above Lab results reviewed.  Recent Imaging Review  US PELVIC COMPLETE W TRANSVAGINAL AND  TORSION R/O CLINICAL DATA:  Pelvic pain, postmenopausal bleeding for 3 months  EXAM: TRANSABDOMINAL AND TRANSVAGINAL ULTRASOUND OF PELVIS  DOPPLER ULTRASOUND OF OVARIES  TECHNIQUE: Both transabdominal and transvaginal ultrasound examinations of the pelvis were performed. Transabdominal technique was performed for global imaging of the pelvis including uterus, ovaries, adnexal regions, and pelvic cul-de-sac.  It was necessary to proceed with endovaginal exam following the transabdominal exam to visualize the endometrium. Color and duplex Doppler ultrasound was utilized to evaluate blood flow to the ovaries.  COMPARISON:  03/25/2020  FINDINGS: Uterus  Measurements: 11.4 x 5.7 x 6.8 cm = volume: 233 mL. Anteverted. Tiny nabothian cysts at cervix. Heterogeneous myometrium. No focal uterine mass.  Endometrium  Thickness: 8 mm.  No endometrial fluid or focal abnormality  Right ovary  Measurements: 2.0 x 2.4 x 1.9 cm = volume: None 4.7 mL. Seen only on transabdominal imaging, obscured on transvaginal imaging by bowel. Normal morphology without mass. Internal blood flow present on color Doppler imaging.  Left ovary  Measurements: 3.7 x 2.1 x 4.2 cm = volume: 17.1 mL. Cyst within LEFT ovary 3.6 x 3.3 x 2.0 cm, with minimal dependent debris. No mural nodularity or septations. No abnormal internal blood flow within the lesion. Blood flow present within LEFT ovary on color Doppler imaging.  Pulsed Doppler evaluation of both ovaries demonstrates normal low-resistance arterial and venous waveforms.  Other findings  No free pelvic fluid.  No adnexal masses.  IMPRESSION: Unremarkable uterus, endometrial complex and RIGHT ovary.  3.6 cm diameter cyst within LEFT ovary containing minimal dependent debris.  No evidence of ovarian torsion.  8 mm thick endometrial complex, abnormal for a postmenopausal patient with bleeding; in the setting of post-menopausal  bleeding, endometrial sampling is indicated to exclude carcinoma. If results are benign, sonohysterogram  should be considered for focal lesion work-up. (Ref: Radiological Reasoning: Algorithmic Workup of Abnormal Vaginal Bleeding with Endovaginal Sonography and Sonohysterography. AJR 2008; 381:O17-51)  These results will be called to the ordering clinician or representative by the Radiologist Assistant, and communication documented in the PACS or [employer].  Electronically Signed   By: Lavonia Dana M.D.   On: 05/28/2020 10:33  Note: Reviewed        Physical Exam  General appearance: Well nourished, well developed, and well hydrated. In no apparent acute distress Mental status: Alert, oriented x 3 (person, place, & time)       Respiratory: No evidence of acute respiratory distress Eyes: PERLA Vitals: BP (!) 144/87    Pulse 93    Temp (!) 97.2 F (36.2 C) (Temporal)    Resp 16    Ht '5\' 4"'  (1.626 m)    Wt 220 lb (99.8 kg)    LMP 03/07/2020    SpO2 100%    BMI 37.76 kg/m  BMI: Estimated body mass index is 37.76 kg/m as calculated from the following:   Height as of this encounter: '5\' 4"'  (1.626 m).   Weight as of this encounter: 220 lb (99.8 kg). Ideal: Ideal body weight: 54.7 kg (120 lb 9.5 oz) Adjusted ideal body weight: 72.7 kg (160 lb 5.7 oz)  Lumbar Spine Area Exam  Skin & Axial Inspection: Well healed scar from previous spine surgery detected Alignment: Symmetrical Functional ROM: Pain restricted ROM       Stability: No instability detected Muscle Tone/Strength: Functionally intact. No obvious neuro-muscular anomalies detected. Sensory (Neurological): Dermatomal pain pattern Palpation: Complains of area being tender to palpation       Provocative Tests: Hyperextension/rotation test: (+) bilaterally for facet joint pain. Lumbar quadrant test (Kemp's test): (+) bilaterally for facet joint pain. Lateral bending test: (+) ipsilateral radicular pain, bilaterally. Positive  for bilateral foraminal stenosis.  Gait & Posture Assessment  Ambulation: Unassisted Gait: Relatively normal for age and body habitus Posture: WNL  Lower Extremity Exam      Side: Right lower extremity   Side: Left lower extremity  Stability: No instability observed           Stability: No instability observed          Skin & Extremity Inspection: Skin color, temperature, and hair growth are WNL. No peripheral edema or cyanosis. No masses, redness, swelling, asymmetry, or associated skin lesions. No contractures.   Skin & Extremity Inspection: Skin color, temperature, and hair growth are WNL. No peripheral edema or cyanosis. No masses, redness, swelling, asymmetry, or associated skin lesions. No contractures.  Functional ROM: Pain restricted ROM for hip and knee joints           Functional ROM: Pain restricted ROM for hip and knee joints          Muscle Tone/Strength: Functionally intact. No obvious neuro-muscular anomalies detected.   Muscle Tone/Strength: Functionally intact. No obvious neuro-muscular anomalies detected.  Sensory (Neurological): Neurogenic pain pattern         Sensory (Neurological): Neurogenic pain pattern          Assessment   Status Diagnosis  Having a Flare-up Having a Flare-up Controlled 1. Lumbar spondylosis   2. Spondylolisthesis of lumbosacral region   3. Chronic pain syndrome       Plan of Care    1. Lumbar spondylosis - LUMBAR FACET(MEDIAL BRANCH NERVE BLOCK) MBNB; Future - gabapentin (NEURONTIN) 300 MG capsule; Take 1-2 capsules (300-600 mg  total) by mouth at bedtime.  Dispense: 60 capsule; Refill: 5 -Continue with home stretching exercises  2. Spondylolisthesis of lumbosacral region - LUMBAR FACET(MEDIAL BRANCH NERVE BLOCK) MBNB; Future  3. Chronic pain syndrome - LUMBAR FACET(MEDIAL BRANCH NERVE BLOCK) MBNB; Future - gabapentin (NEURONTIN) 300 MG capsule; Take 1-2 capsules (300-600 mg total) by mouth at bedtime.  Dispense: 60 capsule; Refill:  5  Future considerations include spinal cord stimulation for failed back surgical syndrome, postlaminectomy pain syndrome.   Follow-up plan:   Return in about 2 weeks (around 07/10/2021) for B/L L3,4,5 MBNB IV Versed in Clinic.    Recent Visits No visits were found meeting these conditions. Showing recent visits within past 90 days and meeting all other requirements Today's Visits Date Type Provider Dept  06/26/21 Office Visit Gillis Santa, MD [facility]  Showing today's visits and meeting all other requirements Future Appointments No visits were found meeting these conditions. Showing future appointments within next 90 days and meeting all other requirements  I discussed the assessment and treatment plan with the patient. The patient was provided an opportunity to ask questions and all were answered. The patient agreed with the plan and demonstrated an understanding of the instructions.  Patient advised to call back or seek an in-person evaluation if the symptoms or condition worsens.  Duration of encounter: 54mnutes.  Note by: BGillis Santa MD Date: 06/26/2021; Time: 8:59 AM

## 2021-06-26 NOTE — Patient Instructions (Signed)
______________________________________________________________________ ° °Preparing for Procedure with Sedation ° °NOTICE: Due to recent regulatory changes, starting on February 03, 2021, procedures requiring intravenous (IV) sedation will no longer be performed at the [facility].  These types of procedures are required to be performed at [facility] ambulatory surgery facility.  We are very sorry for the inconvenience. ° °Procedure appointments are limited to planned procedures: °No Prescription Refills. °No disability issues will be discussed. °No medication changes will be discussed. ° °Instructions: °Oral Intake: Do not eat or drink anything for at least 8 hours prior to your procedure. (Exception: Blood Pressure Medication. See below.) °Transportation: A driver is required. You may not drive yourself after the procedure. °Blood Pressure Medicine: Do not forget to take your blood pressure medicine with a sip of water the morning of the procedure. If your Diastolic (lower reading) is above 100 mmHg, elective cases will be cancelled/rescheduled. °Blood thinners: These will need to be stopped for procedures. Notify our staff if you are taking any blood thinners. Depending on which one you take, there will be specific instructions on how and when to stop it. °Diabetics on insulin: Notify the staff so that you can be scheduled 1st case in the morning. If your diabetes requires high dose insulin, take only ½ of your normal insulin dose the morning of the procedure and notify the staff that you have done so. °Preventing infections: Shower with an antibacterial soap the morning of your procedure. °Build-up your immune system: Take 1000 mg of Vitamin C with every meal (3 times a day) the day prior to your procedure. °Antibiotics: Inform the staff if you have a condition or reason that requires you to take antibiotics before dental procedures. °Pregnancy: If you are pregnant, call and cancel the procedure. °Sickness: If  you have a cold, fever, or any active infections, call and cancel the procedure. °Arrival: You must be in the facility at least 30 minutes prior to your scheduled procedure. °Children: Do not bring children with you. °Dress appropriately: Bring dark clothing that you would not mind if they get stained. °Valuables: Do not bring any jewelry or valuables. ° °Reasons to call and reschedule or cancel your procedure: (Following these recommendations will minimize the risk of a serious complication.) °Surgeries: Avoid having procedures within 2 weeks of any surgery. (Avoid for 2 weeks before or after any surgery). °Flu Shots: Avoid having procedures within 2 weeks of a flu shots. (Avoid for 2 weeks before or after immunizations). °Barium: Avoid having a procedure within 7-10 days after having had a radiological study involving the use of radiological contrast. (Myelograms, Barium swallow or enema study). °Heart attacks: Avoid any elective procedures or surgeries for the initial 6 months after a "Myocardial Infarction" (Heart Attack). °Blood thinners: It is imperative that you stop these medications before procedures. Let us know if you if you take any blood thinner.  °Infection: Avoid procedures during or within two weeks of an infection (including chest colds or gastrointestinal problems). Symptoms associated with infections include: Localized redness, fever, chills, night sweats or profuse sweating, burning sensation when voiding, cough, congestion, stuffiness, runny nose, sore throat, diarrhea, nausea, vomiting, cold or Flu symptoms, recent or current infections. It is specially important if the infection is over the area that we intend to treat. °Heart and lung problems: Symptoms that may suggest an active cardiopulmonary problem include: cough, chest pain, breathing difficulties or shortness of breath, dizziness, ankle swelling, uncontrolled high or unusually low blood pressure, and/or palpitations. If you are    experiencing any of these symptoms, cancel your procedure and contact your primary care physician for an evaluation.  Remember:  Regular Business hours are:  Monday to Thursday 8:00 AM to 4:00 PM  Provider's Schedule: Milinda Pointer, MD:  Procedure days: Tuesday and Thursday 7:30 AM to 4:00 PM  Gillis Santa, MD:  Procedure days: Monday and Wednesday 7:30 AM to 4:00 PM ______________________________________________________________________  Facet Joint Block The facet joints connect the bones of the spine (vertebrae). They make it possible for you to bend, twist, and make other movements with your spine. They also keep you from bending too far, twisting too far, and making other extreme movements. A facet joint block is a procedure in which a numbing medicine (anesthetic) is injected into a facet joint. In many cases, an anti-inflammatory medicine (steroid) is also injected. A facet joint block may be done: To diagnose neck or back pain. If the pain gets better after a facet joint block, it means the pain is probably coming from the facet joint. If the pain does not get better, it means the pain is probably not coming from the facet joint. To relieve neck or back pain that is caused by an inflamed facet joint. A facet joint block is only done to relieve pain if the pain does not improve with other methods, such as medicine, exercise programs, and physical therapy. Tell a health care provider about: Any allergies you have. All medicines you are taking, including vitamins, herbs, eye drops, creams, and over-the-counter medicines. Any problems you or family members have had with anesthetic medicines. Any blood disorders you have. Any surgeries you have had. Any medical conditions you have or have had. Whether you are pregnant or may be pregnant. What are the risks? Generally, this is a safe procedure. However, problems may occur, including: Bleeding. Injury to a nerve near the injection  site. Pain at the injection site. Weakness or numbness in areas controlled by nerves near the injection site. Infection. Temporary fluid retention. Allergic reactions to medicines or dyes. Injury to other structures or organs near the injection site. What happens before the procedure? Medicines Ask your health care provider about: Changing or stopping your regular medicines. This is especially important if you are taking diabetes medicines or blood thinners. Taking medicines such as aspirin and ibuprofen. These medicines can thin your blood. Do not take these medicines unless your health care provider tells you to take them. Taking over-the-counter medicines, vitamins, herbs, and supplements. Eating and drinking Follow instructions from your health care provider about eating and drinking, which may include: 8 hours before the procedure - stop eating heavy meals or foods, such as meat, fried foods, or fatty foods. 6 hours before the procedure - stop eating light meals or foods, such as toast or cereal. 6 hours before the procedure - stop drinking milk or drinks that contain milk. 2 hours before the procedure - stop drinking clear liquids. Staying hydrated Follow instructions from your health care provider about hydration, which may include: Up to 2 hours before the procedure - you may continue to drink clear liquids, such as water, clear fruit juice, black coffee, and plain tea. General instructions Do not use any products that contain nicotine or tobacco for at least 4-6 weeks before the procedure. These products include cigarettes, e-cigarettes, and chewing tobacco. If you need help quitting, ask your health care provider. Plan to have someone take you home from the hospital or clinic. Ask your health care provider: How your surgery  site will be marked. What steps will be taken to help prevent infection. These may include: Removing hair at the surgery site. Washing skin with a  germ-killing soap. Receiving antibiotic medicine. What happens during the procedure?  You will put on a hospital gown. You will lie on your stomach on an X-ray table. You may be asked to lie in a different position if an injection will be made in your neck. Machines will be used to monitor your oxygen levels, heart rate, and blood pressure. Your skin will be cleaned. If an injection will be made in your neck, an IV will be inserted into one of your veins. Fluids and medicine will flow directly into your body through the IV. A numbing medicine (local anesthetic) will be applied to your skin. Your skin may sting or burn for a moment. A video X-ray machine (fluoroscopy) will be used to find the joint. In some cases, a CT scan may be used. A contrast dye may be injected into the facet joint area to help find the joint. When the joint is located, an anesthetic will be injected into the joint through the needle. Your health care provider will ask you whether you feel pain relief. If you feel relief, a steroid may be injected to provide pain relief for a longer period of time. If you do not feel relief or feel only partial relief, additional injections of an anesthetic may be made in other facet joints. The needle will be removed. Your skin will be cleaned. A bandage (dressing) will be applied over each injection site. The procedure may vary among health care providers and hospitals. What happens after the procedure? Your blood pressure, heart rate, breathing rate, and blood oxygen level will be monitored until you leave the hospital or clinic. You will lie down and rest for a period of time. Summary A facet joint block is a procedure in which a numbing medicine (anesthetic) is injected into a facet joint. An anti-inflammatory medicine (stereoid) may also be injected. Follow instructions from your health care provider about medicines and eating and drinking before the procedure. Do not use any  products that contain nicotine or tobacco for at least 4-6 weeks before the procedure. You will lie on your stomach for the procedure, but you may be asked to lie in a different position if an injection will be made in your neck. When the joint is located, an anesthetic will be injected into the joint through the needle. This information is not intended to replace advice given to you by your health care provider. Make sure you discuss any questions you have with your health care provider. Document Revised: 10/13/2018 Document Reviewed: 05/27/2018 Elsevier Patient Education  2022 [employer].

## 2021-06-26 NOTE — Progress Notes (Signed)
Safety precautions to be maintained throughout the outpatient stay will include: orient to surroundings, keep bed in low position, maintain call bell within reach at all times, provide assistance with transfer out of bed and ambulation.  

## 2021-07-21 ENCOUNTER — Ambulatory Visit (INDEPENDENT_AMBULATORY_CARE_PROVIDER_SITE_OTHER): Payer: Commercial Managed Care - PPO | Admitting: Family Medicine

## 2021-07-21 ENCOUNTER — Other Ambulatory Visit: Payer: Self-pay

## 2021-07-21 ENCOUNTER — Encounter: Payer: Self-pay | Admitting: Family Medicine

## 2021-07-21 VITALS — BP 138/76 | HR 96 | Temp 97.7°F | Resp 16 | Ht 64.0 in | Wt 223.0 lb

## 2021-07-21 DIAGNOSIS — E039 Hypothyroidism, unspecified: Secondary | ICD-10-CM

## 2021-07-21 DIAGNOSIS — N390 Urinary tract infection, site not specified: Secondary | ICD-10-CM

## 2021-07-21 DIAGNOSIS — R300 Dysuria: Secondary | ICD-10-CM

## 2021-07-21 DIAGNOSIS — Z87442 Personal history of urinary calculi: Secondary | ICD-10-CM

## 2021-07-21 DIAGNOSIS — K219 Gastro-esophageal reflux disease without esophagitis: Secondary | ICD-10-CM

## 2021-07-21 DIAGNOSIS — R319 Hematuria, unspecified: Secondary | ICD-10-CM | POA: Diagnosis not present

## 2021-07-21 LAB — POCT URINALYSIS DIPSTICK (MANUAL)
Nitrite, UA: NEGATIVE
Poct Bilirubin: NEGATIVE
Poct Glucose: NORMAL mg/dL
Poct Ketones: NEGATIVE
Poct Protein: NEGATIVE mg/dL
Poct Urobilinogen: NORMAL mg/dL
Spec Grav, UA: 1.015 (ref 1.010–1.025)
pH, UA: 6.5 (ref 5.0–8.0)

## 2021-07-21 MED ORDER — OMEPRAZOLE 20 MG PO CPDR: 20.0000 mg | DELAYED_RELEASE_CAPSULE | Freq: Every day | ORAL | 0 refills | Status: DC

## 2021-07-21 MED ORDER — LEVOTHYROXINE SODIUM 25 MCG PO TABS: 25.0000 ug | ORAL_TABLET | Freq: Every day | ORAL | 1 refills | Status: DC

## 2021-07-21 NOTE — Progress Notes (Signed)
Name: Kelly [name]   MRN: [id number]    DOB: [birth year]   Date:07/21/2021       Progress Note  Subjective  Chief Complaint  UTI symptoms/Medication Refill  HPI  Dysuria: she was treated for UTI by infusion 05/15/21 - it was Gentamycin. We had to send her for infusion because she has multiple drug allergies. She also had a reaction to Gentamicin and was sent to [facility] for hives that did not resolved at the infusion center, but improved by the time she went to [facility]. She is back with some dysuria and also urinary urgency that started in the past couple of days. She has a history of kidney stones and not seeing Urologist. Explained we will need to refer her back to Urologist since she had macroscopic hematuria a couple of days ago and some right lower quadrant pain that has not resolved. We will wait for culture to decide if she needs to be treated with antibiotics. She had some chills. No nausea or vomiting. .  Morbid obesity: she was losing weight on Saxenda , weight was 246 lbs when we started medication back in May 2022. Currently taking it daily 0.6 mg because when she goes up on the dose she gets increase in nausea. She is down to 223 lbs, her weight is the same in the past 2 months. She has lost 5 % of the original weight and is maintaining. She develops a red localized rash on site of injection but no hives, SOB or any other problems beside nausea. BMI still above 35 with co-morbidities such as Metabolic Syndrome, OA, DDD spine.   Hypothyroidism: her TSH was over 15, we started her on low dose levothyroxine , currently taking 25 mg daily and twice a week she takes two on T and T, back in Nov her level was at goal, however she has been out of medication for the past month. Explained importance of compliance , she will resume medication today . Recheck levels next visit    Patient Active Problem List   Diagnosis Date Noted   Urinary tract infection 05/15/2021   Insomnia due to medical condition  04/14/2021   MDD (major depressive disorder), recurrent, in full remission ([place]) 02/10/2021   Bereavement 02/10/2021   Pelvic pain    Postmenopausal bleeding    Chronic pain syndrome 12/14/2019   Lumbar spondylosis 05/01/2019   Cervicalgia 05/01/2019   Polyp of sigmoid colon    GAD (generalized anxiety disorder) 02/28/2019   Panic disorder 02/28/2019   Insomnia due to mental condition 02/28/2019   Morbid obesity ([place]) 07/26/2017   Spondylolisthesis of lumbosacral region 02/11/2016   Right lumbar radiculitis 12/28/2014   MDD (major depressive disorder), recurrent episode, moderate ([place]) 12/27/2014   Gastro-esophageal reflux disease without esophagitis 12/27/2014   Bulge of lumbar disc without myelopathy 01/03/1600   Dysmetabolic syndrome 09/32/3557   Migraine without aura and responsive to treatment 12/27/2014   Osteoarthrosis 12/27/2014   Restless leg 12/27/2014   Allergic rhinitis, seasonal 12/27/2014    Past Surgical History:  Procedure Laterality Date   ABDOMINAL HYSTERECTOMY     BACK SURGERY     COLONOSCOPY WITH PROPOFOL N/A 02/03/2019   Procedure: COLONOSCOPY WITH PROPOFOL;  Surgeon: Jonathon Bellows, MD;  Location: [facility] ENDOSCOPY;  Service: Gastroenterology;  Laterality: N/A;   COLONOSCOPY WITH PROPOFOL N/A 03/10/2019   Procedure: COLONOSCOPY WITH PROPOFOL;  Surgeon: Jonathon Bellows, MD;  Location: [facility] ENDOSCOPY;  Service: Gastroenterology;  Laterality: N/A;   CYSTOSCOPY N/A 06/25/2020  Procedure: CYSTOSCOPY;  Surgeon: Homero Fellers, MD;  Location: [facility] ORS;  Service: Gynecology;  Laterality: N/A;   DIAGNOSTIC LAPAROSCOPY     DILATION AND CURETTAGE OF UTERUS     HYSTEROSCOPY WITH D & C N/A 05/29/2020   Procedure: DILATATION AND CURETTAGE /HYSTEROSCOPY;  Surgeon: Homero Fellers, MD;  Location: [facility] ORS;  Service: Gynecology;  Laterality: N/A;   KNEE ARTHROSCOPY WITH MEDIAL MENISECTOMY Left 01/14/2017   Procedure: KNEE ARTHROSCOPY WITH MEDIAL AND LATERAL  MENISECTOMY;  Surgeon: Hessie Knows, MD;  Location: [facility] ORS;  Service: Orthopedics;  Laterality: Left;  Partial Knee menisectomy   KNEE SURGERY Left 05/2011   Dr. Rudene Christians- Arthroscopic   LUMBAR LAMINECTOMY/DECOMPRESSION MICRODISCECTOMY N/A 09/16/2016   Procedure: LUMBAR LAMINECTOMY/DECOMPRESSION MICRODISCECTOMY 1 LEVEL L5-S1;  Surgeon: Meade Maw, MD;  Location: [facility] ORS;  Service: Neurosurgery;  Laterality: N/A;   TONSILLECTOMY AND ADENOIDECTOMY     TOTAL LAPAROSCOPIC HYSTERECTOMY WITH SALPINGECTOMY Bilateral 06/25/2020   Procedure: TOTAL LAPAROSCOPIC HYSTERECTOMY WITH BILATERAL SALPINGECTOMY;  Surgeon: Homero Fellers, MD;  Location: [facility] ORS;  Service: Gynecology;  Laterality: Bilateral;   URETHRAL STRICTURE DILATATION     WISDOM TOOTH EXTRACTION      Family History  Problem Relation Age of Onset   Dementia Father    Dementia Paternal Aunt    Cancer Paternal Grandmother    Anxiety disorder Cousin    Depression Cousin     Social History   Tobacco Use   Smoking status: Former    Packs/day: 0.75    Years: 2.00    Pack years: 1.50    Types: Cigarettes    Start date: 07/06/1981    Quit date: 07/07/1983    Years since quitting: 38.0   Smokeless tobacco: Never  Substance Use Topics   Alcohol use: No    Alcohol/week: 0.0 standard drinks     Current Outpatient Medications:    busPIRone (BUSPAR) 7.5 MG tablet, Take 1 tablet (7.5 mg total) by mouth daily., Disp: 90 tablet, Rfl: 0   EPINEPHrine 0.3 mg/0.3 mL IJ SOAJ injection, Inject 0.3 mg into the muscle as needed for anaphylaxis. Follow package instructions as needed for severe allergy or anaphylactic reaction., Disp: 2 each, Rfl: 3   FLOWFLEX COVID-19 AG HOME TEST KIT, , Disp: , Rfl:    gabapentin (NEURONTIN) 300 MG capsule, Take 1-2 capsules (300-600 mg total) by mouth at bedtime., Disp: 60 capsule, Rfl: 5   hydrOXYzine (ATARAX/VISTARIL) 25 MG tablet, Take 1 tablet (25 mg total) by mouth 2 (two) times daily as needed.  For anxiety attacks only, Disp: 180 tablet, Rfl: 0   Insulin Pen Needle 32G X 6 MM MISC, 1 each by Does not apply route daily., Disp: 100 each, Rfl: 1   levothyroxine (SYNTHROID) 25 MCG tablet, Take 1-2 tablets (25-50 mcg total) by mouth daily. And two on T and T, Disp: 38 tablet, Rfl: 1   Liraglutide -Weight Management (SAXENDA) 18 MG/3ML SOPN, Inject 0.6-3 mg into the skin daily., Disp: 15 mL, Rfl: 1   mirtazapine (REMERON) 15 MG tablet, Take 1 tablet (15 mg total) by mouth at bedtime. Dose increase, Disp: 90 tablet, Rfl: 0   nystatin (MYCOSTATIN/NYSTOP) powder, Apply 1 application topically 3 (three) times daily., Disp: 60 g, Rfl: 0   omeprazole (PRILOSEC) 40 MG capsule, Take 1 capsule (40 mg total) by mouth at bedtime., Disp: 90 capsule, Rfl: 1   prazosin (MINIPRESS) 1 MG capsule, Take 1 capsule (1 mg total) by mouth at bedtime., Disp: 90 capsule,  Rfl: 0   SUMAtriptan (IMITREX) 100 MG tablet, Take 1 tablet (100 mg total) by mouth every 2 (two) hours as needed for migraine. May repeat in 2 hours if headache persists or recurs., Disp: 10 tablet, Rfl: 0   topiramate (TOPAMAX) 100 MG tablet, Take 1 tablet (100 mg total) by mouth every evening., Disp: 90 tablet, Rfl: 1   venlafaxine XR (EFFEXOR-XR) 150 MG 24 hr capsule, Take 1 capsule (150 mg total) by mouth every evening. Take with 37.5 mg mg to equal 187.5 mg daily, Disp: 90 capsule, Rfl: 0   venlafaxine XR (EFFEXOR-XR) 37.5 MG 24 hr capsule, Take 1 capsule (37.5 mg total) by mouth daily with breakfast. Take along with 150 mg daily, Disp: 90 capsule, Rfl: 0  Allergies  Allergen Reactions   Penicillins Anaphylaxis, Hives, Nausea And Vomiting and Swelling    Has patient had a PCN reaction causing immediate rash, facial/tongue/throat swelling, SOB or lightheadedness with hypotension: Yes Has patient had a PCN reaction causing severe rash involving mucus membranes or skin necrosis: Yes Has patient had a PCN reaction that required hospitalization  No Has patient had a PCN reaction occurring within the last 10 years: Yes If all of the above answers are "NO", then may proceed with Cephalosporin use.    Chlorhexidine Gluconate Itching and Rash   Ciprofloxacin Hives   Clindamycin/Lincomycin Hives   Erythromycin Hives and Nausea And Vomiting   Gentamicin Hives   Keflex [Cephalexin] Hives   Lyrica [Pregabalin]     Dizziness, syncope   Nitrofurantoin Monohyd Macro Hives and Nausea And Vomiting   Sulfa Antibiotics Hives and Nausea And Vomiting    Rapid heart rate   Tetracyclines & Related Hives   Adhesive [Tape] Rash   Latex Rash   Vancomycin Itching and Rash    I personally reviewed active problem list, medication list, allergies, family history, social history, health maintenance with the patient/caregiver today.   ROS  Constitutional: Negative for fever or weight change.  Respiratory: Negative for cough and shortness of breath.   Cardiovascular: Negative for chest pain or palpitations.  Gastrointestinal: Negative for abdominal pain, no bowel changes.  Musculoskeletal: Negative for gait problem or joint swelling.  Skin: Negative for rash.  Neurological: Negative for dizziness or headache.  No other specific complaints in a complete review of systems (except as listed in HPI above).   Objective  Vitals:   07/21/21 1248  BP: 138/76  Pulse: 96  Resp: 16  Temp: 97.7 F (36.5 C)  SpO2: 99%  Weight: 223 lb (101.2 kg)  Height: _0  (1.626 m)    Body mass index is 38.28 kg/m.  Physical Exam  Constitutional: Patient appears well-developed and well-nourished. Obese  No distress.  HEENT: head atraumatic, normocephalic, pupils equal and reactive to light, neck supple Cardiovascular: Normal rate, regular rhythm and normal heart sounds.  No murmur heard. No BLE edema. Pulmonary/Chest: Effort normal and breath sounds normal. No respiratory distress. Abdominal: Soft.  There is no tenderness.Negative CVA tenderness   Psychiatric: Patient has a normal mood and affect. behavior is normal. Judgment and thought content normal.   Recent Results (from the past 2160 hour(s))  POCT Urinalysis Dipstick     Status: Abnormal   Collection Time: 05/12/21  9:29 AM  Result Value Ref Range   Color, UA Yellow    Clarity, UA Clear    Glucose, UA Negative Negative   Bilirubin, UA Negative    Ketones, UA Negative    Spec Grav, UA >=  1.030 (A) 1.010 - 1.025   Blood, UA Negative    pH, UA 6.0 5.0 - 8.0   Protein, UA Negative Negative   Urobilinogen, UA 0.2 0.2 or 1.0 E.U./dL   Nitrite, UA Negative    Leukocytes, UA Negative Negative   Appearance     Odor    TSH     Status: None   Collection Time: 05/12/21  9:54 AM  Result Value Ref Range   TSH 2.76 0.40 - 4.50 mIU/L  CULTURE, URINE COMPREHENSIVE     Status: Abnormal   Collection Time: 05/12/21  9:54 AM  Result Value Ref Range   MICRO NUMBER: [id number]    SPECIMEN QUALITY: Adequate    Source OTHER (SPECIFY)    STATUS: FINAL    ISOLATE 1: Escherichia coli (A)     Comment: 50,000-100,000 CFU/mL of Escherichia coli      Susceptibility   Escherichia coli - CULT, URN, SPECIAL NEGATIVE 1    AMOX/CLAVULANIC 4 Sensitive     AMPICILLIN 8 Sensitive     AMPICILLIN/SULBACTAM <=2 Sensitive     CEFAZOLIN* <=4 Not Reportable      * For infections other than uncomplicated UTI caused by E. coli, K. pneumoniae or P. mirabilis: Cefazolin is resistant if MIC > or = 8 mcg/mL. (Distinguishing susceptible versus intermediate for isolates with MIC < or = 4 mcg/mL requires additional testing.) For uncomplicated UTI caused by E. coli, K. pneumoniae or P. mirabilis: Cefazolin is susceptible if MIC <32 mcg/mL and predicts susceptible to the oral agents cefaclor, cefdinir, cefpodoxime, cefprozil, cefuroxime, cephalexin and loracarbef.     CEFTAZIDIME <=1 Sensitive     CEFEPIME <=1 Sensitive     CEFTRIAXONE <=1 Sensitive     CIPROFLOXACIN <=0.25 Sensitive     LEVOFLOXACIN  <=0.12 Sensitive     GENTAMICIN <=1 Sensitive     IMIPENEM <=0.25 Sensitive     NITROFURANTOIN <=16 Sensitive     PIP/TAZO <=4 Sensitive     TOBRAMYCIN <=1 Sensitive     TRIMETH/SULFA* <=20 Sensitive      * For infections other than uncomplicated UTI caused by E. coli, K. pneumoniae or P. mirabilis: Cefazolin is resistant if MIC > or = 8 mcg/mL. (Distinguishing susceptible versus intermediate for isolates with MIC < or = 4 mcg/mL requires additional testing.) For uncomplicated UTI caused by E. coli, K. pneumoniae or P. mirabilis: Cefazolin is susceptible if MIC <32 mcg/mL and predicts susceptible to the oral agents cefaclor, cefdinir, cefpodoxime, cefprozil, cefuroxime, cephalexin and loracarbef. Legend: S = Susceptible  I = Intermediate R = Resistant  NS = Not susceptible * = Not tested  NR = Not reported **NN = See antimicrobic comments   CBC     Status: None   Collection Time: 05/15/21  6:35 PM  Result Value Ref Range   WBC 9.8 4.0 - 10.5 K/uL   RBC 4.85 3.87 - 5.11 MIL/uL   Hemoglobin 14.5 12.0 - 15.0 g/dL   HCT 43.9 36.0 - 46.0 %   MCV 90.5 80.0 - 100.0 fL   MCH 29.9 26.0 - 34.0 pg   MCHC 33.0 30.0 - 36.0 g/dL   RDW 13.5 11.5 - 15.5 %   Platelets 257 150 - 400 K/uL   nRBC 0.2 0.0 - 0.2 %    Comment: Performed at [facility], [address] [place], [place] [address]  Basic metabolic panel     Status: Abnormal   Collection Time: 05/15/21  6:35 PM  Result  Value Ref Range   Sodium 139 135 - 145 mmol/L   Potassium 4.0 3.5 - 5.1 mmol/L   Chloride 111 98 - 111 mmol/L   CO2 22 22 - 32 mmol/L   Glucose, Bld 106 (H) 70 - 99 mg/dL    Comment: Glucose reference range applies only to samples taken after fasting for at least 8 hours.   BUN 13 6 - 20 mg/dL   Creatinine, Ser 0.83 0.44 - 1.00 mg/dL   Calcium 8.7 (L) 8.9 - 10.3 mg/dL   GFR, Estimated >60 >60 mL/min    Comment: (NOTE) Calculated using the CKD-EPI Creatinine Equation (2021)    Anion gap 6 5 - 15     Comment: Performed at [facility], [place], [address] [place], [place] [address]  POCT Urinalysis Dip Manual     Status: Abnormal   Collection Time: 07/21/21 12:59 PM  Result Value Ref Range   Spec Grav, UA 1.015 1.010 - 1.025   pH, UA 6.5 5.0 - 8.0   Leukocytes, UA Trace (A) Negative   Nitrite, UA Negative Negative   Poct Protein Negative Negative, trace mg/dL   Poct Glucose Normal Normal mg/dL   Poct Ketones Negative Negative   Poct Urobilinogen Normal Normal mg/dL   Poct Bilirubin Negative Negative   Poct Blood trace Negative, trace     PHQ2/9: Depression screen [facility] 2/9 07/21/2021 06/26/2021 05/12/2021 02/07/2021 12/26/2020  Decreased Interest 0 0 1 1 0  Down, Depressed, Hopeless 0 0 0 1 0  PHQ - 2 Score 0 0 1 2 0  Altered sleeping 0 - 3 0 -  Tired, decreased energy 0 - 3 3 -  Change in appetite 0 - 0 0 -  Feeling bad or failure about yourself  0 - 1 0 -  Trouble concentrating 0 - 0 0 -  Moving slowly or fidgety/restless 0 - 0 0 -  Suicidal thoughts 0 - 0 0 -  PHQ-9 Score 0 - 8 5 -  Difficult doing work/chores - - - - -  Some encounter information is confidential and restricted. Go to Review Flowsheets activity to see all data.  Some recent data might be hidden    phq 9 is negative   Fall Risk: Fall Risk  07/21/2021 06/26/2021 05/12/2021 02/07/2021 12/26/2020  Falls in the past year? 1 0 _0 Number falls in past yr: 1 - 1 1 0  Comment - - - - -  Injury with Fall? 0 - _1 Comment - - - - injured right knee  Risk for fall due to : No Fall Risks - No Fall Risks - -  Risk for fall due to: Comment - - - - -  Follow up Falls prevention discussed - Falls prevention discussed - -      Functional Status Survey: Is the patient deaf or have difficulty hearing?: Yes Does the patient have difficulty seeing, even when wearing glasses/contacts?: No Does the patient have difficulty concentrating, remembering, or making decisions?: No Does the patient have difficulty  walking or climbing stairs?: Yes Does the patient have difficulty dressing or bathing?: Yes Does the patient have difficulty doing errands alone such as visiting a doctor's office or shopping?: Yes    Assessment & Plan  1. Dysuria  - POCT Urinalysis Dip Manual - Urine Culture - Ambulatory referral to Urology  2. Recurrent UTI  - POCT Urinalysis Dip Manual - Urine Culture - Ambulatory referral to Urology  3. History of kidney stones  - Ambulatory referral to Urology  4. Hematuria of unknown cause  - Ambulatory referral to Urology  5. Morbid obesity ([place])  Discussed with the patient the risk posed by an increased BMI. Discussed importance of portion control, calorie counting and at least 150 minutes of physical activity weekly. Avoid sweet beverages and drink more water. Eat at least 6 servings of fruit and vegetables daily   6. Hypothyroidism, adult  - levothyroxine (SYNTHROID) 25 MCG tablet; Take 1-2 tablets (25-50 mcg total) by mouth daily. And two on T and T  Dispense: 114 tablet; Refill: 1 - TSH

## 2021-07-23 ENCOUNTER — Other Ambulatory Visit: Payer: Self-pay

## 2021-07-23 ENCOUNTER — Ambulatory Visit
Admission: RE | Admit: 2021-07-23 | Discharge: 2021-07-23 | Disposition: A | Discharge status: Home / Self Care | Payer: Commercial Managed Care - PPO | Source: Ambulatory Visit | Attending: Student in an Organized Health Care Education/Training Program | Admitting: Student in an Organized Health Care Education/Training Program

## 2021-07-23 ENCOUNTER — Encounter: Payer: Self-pay | Admitting: Student in an Organized Health Care Education/Training Program

## 2021-07-23 ENCOUNTER — Ambulatory Visit (HOSPITAL_BASED_OUTPATIENT_CLINIC_OR_DEPARTMENT_OTHER): Payer: Commercial Managed Care - PPO | Admitting: Student in an Organized Health Care Education/Training Program

## 2021-07-23 ENCOUNTER — Other Ambulatory Visit: Payer: Self-pay | Admitting: Family Medicine

## 2021-07-23 DIAGNOSIS — N390 Urinary tract infection, site not specified: Secondary | ICD-10-CM

## 2021-07-23 DIAGNOSIS — G894 Chronic pain syndrome: Secondary | ICD-10-CM | POA: Insufficient documentation

## 2021-07-23 DIAGNOSIS — M47816 Spondylosis without myelopathy or radiculopathy, lumbar region: Secondary | ICD-10-CM | POA: Insufficient documentation

## 2021-07-23 DIAGNOSIS — K219 Gastro-esophageal reflux disease without esophagitis: Secondary | ICD-10-CM

## 2021-07-23 DIAGNOSIS — M4317 Spondylolisthesis, lumbosacral region: Secondary | ICD-10-CM | POA: Insufficient documentation

## 2021-07-23 DIAGNOSIS — Z881 Allergy status to other antibiotic agents status: Secondary | ICD-10-CM

## 2021-07-23 LAB — URINE CULTURE
MICRO NUMBER:: 12876431
SPECIMEN QUALITY:: ADEQUATE

## 2021-07-23 MED ORDER — OMEPRAZOLE 40 MG PO CPDR
40.0000 mg | DELAYED_RELEASE_CAPSULE | Freq: Every day | ORAL | 0 refills | Status: AC
Start: 2021-07-23 — End: ?

## 2021-07-23 MED ORDER — LIDOCAINE HCL 2 % IJ SOLN
20.0000 mL | Freq: Once | INTRAMUSCULAR | Status: AC
  Administered 2021-07-23: 400 mg

## 2021-07-23 MED ORDER — ROPIVACAINE HCL 2 MG/ML IJ SOLN
9.0000 mL | Freq: Once | INTRAMUSCULAR | Status: AC
  Administered 2021-07-23: 9 mL via PERINEURAL

## 2021-07-23 MED ORDER — MIDAZOLAM HCL 5 MG/5ML IJ SOLN
0.5000 mg | Freq: Once | INTRAMUSCULAR | Status: AC
  Administered 2021-07-23: 2 mg via INTRAVENOUS

## 2021-07-23 MED ORDER — LIDOCAINE HCL 2 % IJ SOLN
INTRAMUSCULAR | Status: AC
  Filled 2021-07-23: qty 20

## 2021-07-23 MED ORDER — ROPIVACAINE HCL 2 MG/ML IJ SOLN
INTRAMUSCULAR | Status: AC
  Filled 2021-07-23: qty 20

## 2021-07-23 MED ORDER — DEXAMETHASONE SODIUM PHOSPHATE 10 MG/ML IJ SOLN
10.0000 mg | Freq: Once | INTRAMUSCULAR | Status: AC
  Administered 2021-07-23: 10 mg

## 2021-07-23 MED ORDER — GABAPENTIN 300 MG PO CAPS: 300.0000 mg | ORAL_CAPSULE | Freq: Every day | ORAL | 1 refills | Status: AC

## 2021-07-23 MED ORDER — MIDAZOLAM HCL 5 MG/5ML IJ SOLN
INTRAMUSCULAR | Status: AC
  Filled 2021-07-23: qty 5

## 2021-07-23 MED ORDER — DEXAMETHASONE SODIUM PHOSPHATE 10 MG/ML IJ SOLN
INTRAMUSCULAR | Status: AC
  Filled 2021-07-23: qty 2

## 2021-07-23 NOTE — Progress Notes (Signed)
Patient's Name: Kelly [name]  MRN: [id number]  Referring Provider: Gillis Santa, MD  DOB: [birth year]  PCP: Steele Sizer, MD  DOS: 07/23/2021  Note by: Gillis Santa, MD  Service setting: Ambulatory outpatient  Specialty: Interventional Pain Management  Patient type: Established  Location: [facility] (AMB) Pain Management Facility  Visit type: Interventional Procedure   Primary Reason for Visit: Interventional Pain Management Treatment. CC: Hip Pain (left)  Procedure:  Anesthesia, Analgesia, Anxiolysis:  Type: Therapeutic Medial Branch Facet Block with steroid #5 (#4 done 05/15/20) Region: Lumbar Level: L3/4, L4/5, L5/S1 Laterality: Bilateral  Type: Local Anesthesia with IV Versed bolus 2 mg  Local Anesthetic: Lidocaine 1% Minimal sedation Indication(s): Analgesia and Anxiety   Indications: 1. Lumbar spondylosis   2. Spondylolisthesis of lumbosacral region   3. Chronic pain syndrome    Pain Score: Pre-procedure: 7 /10 Post-procedure: 5 /10  Pre-op Assessment:  Kelly [name] is a 56 y.o. (year old), female patient, seen today for interventional treatment. She  has a past surgical history that includes Tonsillectomy and adenoidectomy; Urethra dilation; Knee surgery (Left, 05/2011); Lumbar laminectomy/decompression microdiscectomy (N/A, 09/16/2016); Back surgery; Diagnostic laparoscopy; Dilation and curettage of uterus; Knee arthroscopy with medial menisectomy (Left, 01/14/2017); Colonoscopy with propofol (N/A, 02/03/2019); Colonoscopy with propofol (N/A, 03/10/2019); Hysteroscopy with D & C (N/A, 05/29/2020); Wisdom tooth extraction; Total laparoscopic hysterectomy with salpingectomy (Bilateral, 06/25/2020); Cystoscopy (N/A, 06/25/2020); and Abdominal hysterectomy. Kelly [name] has a current medication list which includes the following prescription(s): buspirone, epinephrine, hydroxyzine, insulin pen needle, levothyroxine, saxenda, mirtazapine, nystatin, prazosin, sumatriptan, topiramate,  venlafaxine xr, venlafaxine xr, gabapentin, and omeprazole. Her primarily concern today is the Hip Pain (left)  Initial Vital Signs: There were no vitals taken for this visit. BMI: Estimated body mass index is 41.02 kg/m as calculated from the following:   Height as of this encounter: 5\' 4"  (1.626 m).   Weight as of this encounter: 239 lb (108.4 kg).  Risk Assessment: Allergies: Reviewed. She is allergic to penicillins, chlorhexidine gluconate, ciprofloxacin, clindamycin/lincomycin, erythromycin, gentamicin, keflex [cephalexin], lyrica [pregabalin], nitrofurantoin monohyd macro, sulfa antibiotics, tetracyclines & related, adhesive [tape], latex, and vancomycin.  Allergy Precautions: None required Coagulopathies: Reviewed. None identified.  Blood-thinner therapy: None at this time Active Infection(s): Reviewed. None identified. Kelly [name] is afebrile  Site Confirmation: [name] was asked to confirm the procedure and laterality before marking the site Procedure checklist: Completed Consent: Before the procedure and under the influence of no sedative(s), amnesic(s), or anxiolytics, the patient was informed of the treatment options, risks and possible complications. To fulfill our ethical and legal obligations, as recommended by the American Medical Association's Code of Ethics, I have informed the patient of my clinical impression; the nature and purpose of the treatment or procedure; the risks, benefits, and possible complications of the intervention; the alternatives, including doing nothing; the risk(s) and benefit(s) of the alternative treatment(s) or procedure(s); and the risk(s) and benefit(s) of doing nothing. The patient was provided information about the general risks and possible complications associated with the procedure. These may include, but are not limited to: failure to achieve desired goals, infection, bleeding, organ or nerve damage, allergic reactions, paralysis, and  death. In addition, the patient was informed of those risks and complications associated to Spine-related procedures, such as failure to decrease pain; infection (i.e.: Meningitis, epidural or intraspinal abscess); bleeding (i.e.: epidural hematoma, subarachnoid hemorrhage, or any other type of intraspinal or peri-dural bleeding); organ or nerve damage (i.e.: Any type of peripheral nerve, nerve root, or spinal cord injury)  with subsequent damage to sensory, motor, and/or autonomic systems, resulting in permanent pain, numbness, and/or weakness of one or several areas of the body; allergic reactions; (i.e.: anaphylactic reaction); and/or death. Furthermore, the patient was informed of those risks and complications associated with the medications. These include, but are not limited to: allergic reactions (i.e.: anaphylactic or anaphylactoid reaction(s)); adrenal axis suppression; blood sugar elevation that in diabetics may result in ketoacidosis or comma; water retention that in patients with history of congestive heart failure may result in shortness of breath, pulmonary edema, and decompensation with resultant heart failure; weight gain; swelling or edema; medication-induced neural toxicity; particulate matter embolism and blood vessel occlusion with resultant organ, and/or nervous system infarction; and/or aseptic necrosis of one or more joints. Finally, the patient was informed that Medicine is not an exact science; therefore, there is also the possibility of unforeseen or unpredictable risks and/or possible complications that may result in a catastrophic outcome. The patient indicated having understood very clearly. We have given the patient no guarantees and we have made no promises. Enough time was given to the patient to ask questions, all of which were answered to the patient's satisfaction. Kelly [name] has indicated that she wanted to continue with the procedure. Attestation: I, the ordering provider,  attest that I have discussed with the patient the benefits, risks, side-effects, alternatives, likelihood of achieving goals, and potential problems during recovery for the procedure that I have provided informed consent. Date: 07/23/2021; Time: 11:02 AM  Pre-Procedure Preparation:  Monitoring: As per clinic protocol. Respiration, ETCO2, SpO2, BP, heart rate and rhythm monitor placed and checked for adequate function Safety Precautions: Patient was assessed for positional comfort and pressure points before starting the procedure. Time-out: I initiated and conducted the "Time-out" before starting the procedure, as per protocol. The patient was asked to participate by confirming the accuracy of the "Time Out" information. Verification of the correct person, site, and procedure were performed and confirmed by me, the nursing staff, and the patient. "Time-out" conducted as per Joint Commission's Universal Protocol (UP.01.01.01). "Time-out" Date & Time: 07/23/2021; 1110 hrs.  Description of Procedure Process:   Position: Prone Target Area: For Lumbar Facet blocks, the target is the groove formed by the junction of the transverse process and superior articular process. Approach: Paramedial approach. Area Prepped: Entire Posterior Lumbosacral Region Prepping solution: ChloraPrep (2% chlorhexidine gluconate and 70% isopropyl alcohol) Safety Precautions: Aspiration looking for blood return was conducted prior to all injections. At no point did we inject any substances, as a needle was being advanced. No attempts were made at seeking any paresthesias. Safe injection practices and needle disposal techniques used. Medications properly checked for expiration dates. SDV (single dose vial) medications used. Description of the Procedure: Protocol guidelines were followed. The patient was placed in position over the fluoroscopy table. The target area was identified and the area prepped in the usual manner. Skin  desensitized using vapocoolant spray. Skin & deeper tissues infiltrated with local anesthetic. Appropriate amount of time allowed to pass for local anesthetics to take effect. The procedure needle was introduced through the skin, ipsilateral to the reported pain, and advanced to the target area. Employing the Medial Branch Technique, the needles were advanced to the angle made by the superior and medial portion of the transverse process, and the lateral and inferior portion of the superior articulating process of the targeted vertebral bodies. This area is known as [employer] or the [employer] of the [employer].  Negative aspiration confirmed. Solution  injected in intermittent fashion, asking for systemic symptoms every 0.5cc of injectate. The needles were then removed and the area cleansed, making sure to leave some of the prepping solution back to take advantage of its long term bactericidal properties.   Illustration of the posterior view of the lumbar spine and the posterior neural structures. Laminae of L2 through S1 are labeled. DPRL5, dorsal primary ramus of L5; DPRS1, dorsal primary ramus of S1; DPR3, dorsal primary ramus of L3; FJ, facet (zygapophyseal) joint L3-L4; I, inferior articular process of L4; LB1, lateral branch of dorsal primary ramus of L1; IAB, inferior articular branches from L3 medial branch (supplies L4-L5 facet joint); IBP, intermediate branch plexus; MB3, medial branch of dorsal primary ramus of L3; NR3, third lumbar nerve root; S, superior articular process of L5; SAB, superior articular branches from L4 (supplies L4-5 facet joint also); TP3, transverse process of L3.  Vitals:   07/23/21 1115 07/23/21 1120 07/23/21 1124 07/23/21 1130  BP: (!) 113/56 104/87 111/70 99/87  Pulse: 85 85 84 88  Resp: 12 11 12 16   Temp:      SpO2: 100% 100% 99% 100%  Weight:      Height:        Start Time: 1110 hrs. End Time: 1122 hrs. Materials:  Needle(s) Type: Regular needle Gauge:  22G Length: 3.5-in Medication(s): We administered lidocaine, midazolam, dexamethasone, dexamethasone, ropivacaine (PF) 2 mg/mL (0.2%), and ropivacaine (PF) 2 mg/mL (0.2%). Please see chart orders for dosing details. 6 cc solution made of 4 cc of 0.2% ropivacaine, 2 cc of Decadron (10 mg/cc).  1 cc injected at each level bilaterally.  Imaging Guidance (Spinal):  Type of Imaging Technique: Fluoroscopy Guidance (Spinal) Indication(s): Assistance in needle guidance and placement for procedures requiring needle placement in or near specific anatomical locations not easily accessible without such assistance. Exposure Time: Please see nurses notes. Contrast: None used. Fluoroscopic Guidance: I was personally present during the use of fluoroscopy. "Tunnel Vision Technique" used to obtain the best possible view of the target area. Parallax error corrected before commencing the procedure. "Direction-depth-direction" technique used to introduce the needle under continuous pulsed fluoroscopy. Once target was reached, antero-posterior, oblique, and lateral fluoroscopic projection used confirm needle placement in all planes. Images permanently stored in EMR. Interpretation: No contrast injected. I personally interpreted the imaging intraoperatively. Adequate needle placement confirmed in multiple planes. Permanent images saved into the patient's record.  Antibiotic Prophylaxis:  Indication(s): None identified Antibiotic given: None  Post-operative Assessment:  EBL: None Complications: No immediate post-treatment complications observed by team, or reported by patient. Note: The patient tolerated the entire procedure well. A repeat set of vitals were taken after the procedure and the patient was kept under observation following institutional policy, for this type of procedure. Post-procedural neurological assessment was performed, showing return to baseline, prior to discharge. The patient was provided with  post-procedure discharge instructions, including a section on how to identify potential problems. Should any problems arise concerning this procedure, the patient was given instructions to immediately contact us, at any time, without hesitation. In any case, we plan to contact the patient by telephone for a follow-up status report regarding this interventional procedure. Comments:  No additional relevant information. 5 out of 5 strength bilateral lower extremity: Plantar flexion, dorsiflexion, knee flexion, knee extension.  Plan of Care   Imaging Orders         DG PAIN CLINIC C-ARM 1-60 MIN NO REPORT      Medications ordered for procedure: Meds  ordered this encounter  Medications   lidocaine (XYLOCAINE) 2 % (with pres) injection 400 mg   midazolam (VERSED) 5 MG/5ML injection 0.5-2 mg    Make sure Flumazenil is available in the pyxis when using this medication. If oversedation occurs, administer 0.2 mg IV over 15 sec. If after 45 sec no response, administer 0.2 mg again over 1 min; may repeat at 1 min intervals; not to exceed 4 doses (1 mg)   dexamethasone (DECADRON) injection 10 mg   dexamethasone (DECADRON) injection 10 mg   ropivacaine (PF) 2 mg/mL (0.2%) (NAROPIN) injection 9 mL   ropivacaine (PF) 2 mg/mL (0.2%) (NAROPIN) injection 9 mL   gabapentin (NEURONTIN) 300 MG capsule    Sig: Take 1-2 capsules (300-600 mg total) by mouth at bedtime.    Dispense:  180 capsule    Refill:  1   Medications administered: We administered lidocaine, midazolam, dexamethasone, dexamethasone, ropivacaine (PF) 2 mg/mL (0.2%), and ropivacaine (PF) 2 mg/mL (0.2%).  See the medical record for exact dosing, route, and time of administration.  New Prescriptions   OMEPRAZOLE (PRILOSEC) 40 MG CAPSULE    Take 1 capsule (40 mg total) by mouth daily.   Disposition: Discharge home  Discharge Date & Time: 07/23/2021; 1133 hrs.   Physician-requested Follow-up: Return in about 5 weeks (around 08/27/2021) for Post  Procedure Evaluation, virtual. Future Appointments  Date Time Provider [place]  07/25/2021 10:20 AM Ursula Alert, MD [facility] None  08/04/2021  1:35 PM Homero Fellers, MD [facility] None  08/28/2021  3:40 PM Gillis Santa, MD [facility] None  11/10/2021  9:40 AM Steele Sizer, MD [facility]   Primary Care Physician: Steele Sizer, MD Location: [facility] Note by: Gillis Santa, MD Date: 07/23/2021; Time: 12:08 PM  Disclaimer:  Medicine is not an exact science. The only guarantee in medicine is that nothing is guaranteed. It is important to note that the decision to proceed with this intervention was based on the information collected from the patient. The Data and conclusions were drawn from the patient's questionnaire, the interview, and the physical examination. Because the information was provided in large part by the patient, it cannot be guaranteed that it has not been purposely or unconsciously manipulated. Every effort has been made to obtain as much relevant data as possible for this evaluation. It is important to note that the conclusions that lead to this procedure are derived in large part from the available data. Always take into account that the treatment will also be dependent on availability of resources and existing treatment guidelines, considered by other Pain Management Practitioners as being common knowledge and practice, at the time of the intervention. For Medico-Legal purposes, it is also important to point out that variation in procedural techniques and pharmacological choices are the acceptable norm. The indications, contraindications, technique, and results of the above procedure should only be interpreted and judged by a Board-Certified Interventional Pain Specialist with extensive familiarity and expertise in the same exact procedure and technique.

## 2021-07-23 NOTE — Patient Instructions (Signed)

## 2021-07-23 NOTE — Progress Notes (Signed)
Safety precautions to be maintained throughout the outpatient stay will include: orient to surroundings, keep bed in low position, maintain call bell within reach at all times, provide assistance with transfer out of bed and ambulation.  

## 2021-07-24 ENCOUNTER — Telehealth: Payer: Self-pay | Admitting: Family Medicine

## 2021-07-24 ENCOUNTER — Telehealth: Payer: Self-pay | Admitting: *Deleted

## 2021-07-24 ENCOUNTER — Other Ambulatory Visit: Payer: Self-pay | Admitting: Family Medicine

## 2021-07-24 DIAGNOSIS — E039 Hypothyroidism, unspecified: Secondary | ICD-10-CM

## 2021-07-24 MED ORDER — LEVOTHYROXINE SODIUM 25 MCG PO TABS: 25.0000 ug | ORAL_TABLET | Freq: Every day | ORAL | 1 refills | Status: AC

## 2021-07-24 NOTE — Telephone Encounter (Signed)
No problems post procedure. 

## 2021-07-24 NOTE — Telephone Encounter (Signed)
Gerald Stabs with [facility] said the pharmacist needs clarification on the Levothyroxine  CB#  [phone]  ref [phone]

## 2021-07-24 NOTE — Telephone Encounter (Signed)
[facility] called and spoke to [place], [place]. She says the questions the pharmacy has on Levothyroxine: Need clarity on directions: what dose on which days; what is T and T? Need clarity on the amount to dispense for a 90 day supply (114 tabs will not be for 90 days) CB#  [phone]  ref [phone] Advised someone will call back or a new Rx will be sent in electronically.

## 2021-07-25 ENCOUNTER — Telehealth (INDEPENDENT_AMBULATORY_CARE_PROVIDER_SITE_OTHER): Payer: Commercial Managed Care - PPO | Admitting: Psychiatry

## 2021-07-25 ENCOUNTER — Other Ambulatory Visit: Payer: Self-pay

## 2021-07-25 ENCOUNTER — Encounter: Payer: Self-pay | Admitting: Psychiatry

## 2021-07-25 DIAGNOSIS — F410 Panic disorder [episodic paroxysmal anxiety] without agoraphobia: Secondary | ICD-10-CM | POA: Diagnosis not present

## 2021-07-25 DIAGNOSIS — F411 Generalized anxiety disorder: Secondary | ICD-10-CM

## 2021-07-25 DIAGNOSIS — G4701 Insomnia due to medical condition: Secondary | ICD-10-CM | POA: Diagnosis not present

## 2021-07-25 DIAGNOSIS — Z634 Disappearance and death of family member: Secondary | ICD-10-CM

## 2021-07-25 DIAGNOSIS — F3342 Major depressive disorder, recurrent, in full remission: Secondary | ICD-10-CM | POA: Diagnosis not present

## 2021-07-25 MED ORDER — PRAZOSIN HCL 1 MG PO CAPS: 1.0000 mg | ORAL_CAPSULE | Freq: Every day | ORAL | 1 refills | Status: AC

## 2021-07-25 MED ORDER — HYDROXYZINE HCL 25 MG PO TABS: 25.0000 mg | ORAL_TABLET | Freq: Two times a day (BID) | ORAL | 1 refills | Status: AC | PRN

## 2021-07-25 MED ORDER — VENLAFAXINE HCL ER 37.5 MG PO CP24: 37.5000 mg | ORAL_CAPSULE | Freq: Every day | ORAL | 1 refills | Status: DC

## 2021-07-25 MED ORDER — BUSPIRONE HCL 7.5 MG PO TABS: 7.5000 mg | ORAL_TABLET | Freq: Every day | ORAL | 1 refills | Status: DC

## 2021-07-25 MED ORDER — MIRTAZAPINE 15 MG PO TABS: 15.0000 mg | ORAL_TABLET | Freq: Every day | ORAL | 1 refills | Status: DC

## 2021-07-25 MED ORDER — VENLAFAXINE HCL ER 150 MG PO CP24: 150.0000 mg | ORAL_CAPSULE | Freq: Every evening | ORAL | 1 refills | Status: DC

## 2021-07-25 NOTE — Progress Notes (Signed)
Virtual Visit via Video Note  I connected with Kelly [name] on 07/25/21 at 10:20 AM EST by a video enabled telemedicine application and verified that I am speaking with the correct person using two identifiers.  Location Provider Location : [facility] Patient Location : Home  Participants: Patient ,Dad,  Provider    I discussed the limitations of evaluation and management by telemedicine and the availability of in person appointments. The patient expressed understanding and agreed to proceed.   I discussed the assessment and treatment plan with the patient. The patient was provided an opportunity to ask questions and all were answered. The patient agreed with the plan and demonstrated an understanding of the instructions.   The patient was advised to call back or seek an in-person evaluation if the symptoms worsen or if the condition fails to improve as anticipated.   Kelly [name] Progress Note  07/25/2021 11:58 AM Kelly [name]  MRN:  [id number]  Chief Complaint:  Chief Complaint   Follow-up; 56 year old female who has a history of MDD, GAD, panic disorder, insomnia, chronic pain as well as recent grief reaction(loss of her daughter in law )  presents for follow-up for medication management.    HPI: Kelly [name] is a 56 year old Caucasian female who is married, lives in [place], has a history of MDD, GAD, panic disorder, insomnia, gastroesophageal reflux disease, lumbar disc problem, migraine headache was evaluated by telemedicine today.  Patient today reports she had a good holiday season with her family.  She also gets to see her grandchildren more frequently and that helps.  She is coping with her grief better.  Patient reports she was able to get injections to her left hip at Dr. Elwyn Lade office for pain management on Wednesday.  She currently rates her pain at a 3 out of 10, 10 being the worst.  She reports since getting the injections she has been able to walk and  stand better than before.  That is a relief.  That does have an impact on her mood.  She feels better with her mood now.  Patient is compliant on her medications as prescribed.  Denies side effects.  Continues to follow-up with her therapist.  Patient reports sleep is improved.  She does have some nightmares however they have improved a lot.  Having better control of her pain also has helped her sleep to get better.  Denies suicidality, homicidality or perceptual disturbances.  Patient denies any other concerns today.  Visit Diagnosis:    ICD-10-CM   1. MDD (major depressive disorder), recurrent, in full remission ([place])  F33.42 venlafaxine XR (EFFEXOR-XR) 37.5 MG 24 hr capsule    venlafaxine XR (EFFEXOR-XR) 150 MG 24 hr capsule    2. GAD (generalized anxiety disorder)  F41.1 venlafaxine XR (EFFEXOR-XR) 37.5 MG 24 hr capsule    venlafaxine XR (EFFEXOR-XR) 150 MG 24 hr capsule    hydrOXYzine (ATARAX) 25 MG tablet    busPIRone (BUSPAR) 7.5 MG tablet    3. Panic disorder  F41.0 venlafaxine XR (EFFEXOR-XR) 37.5 MG 24 hr capsule    venlafaxine XR (EFFEXOR-XR) 150 MG 24 hr capsule    hydrOXYzine (ATARAX) 25 MG tablet    busPIRone (BUSPAR) 7.5 MG tablet    4. Insomnia due to medical condition  G47.01 prazosin (MINIPRESS) 1 MG capsule    mirtazapine (REMERON) 15 MG tablet   thyroid problem, steroid therapy    5. Bereavement  Z63.4       Past Psychiatric History:  Reviewed past psychiatric history from progress note on 10/25/2018.  Past trials of medications like Paxil, trazodone, hydroxyzine.  Completed IOP on 10/03/2019.  Past Medical History:  Past Medical History:  Diagnosis Date   Allergy    Anxiety    Depression    GERD (gastroesophageal reflux disease)    History of kidney stones    Insomnia    Intermittent low back pain    Migraines    Osteoarthritis    Recurrent UTI    Restless leg syndrome    Sciatica of right side    left side   Symptomatic menopausal or female  climacteric states    Vertigo    Vitamin D deficiency     Past Surgical History:  Procedure Laterality Date   ABDOMINAL HYSTERECTOMY     BACK SURGERY     COLONOSCOPY WITH PROPOFOL N/A 02/03/2019   Procedure: COLONOSCOPY WITH PROPOFOL;  Surgeon: Jonathon Bellows, MD;  Location: [facility] ENDOSCOPY;  Service: Gastroenterology;  Laterality: N/A;   COLONOSCOPY WITH PROPOFOL N/A 03/10/2019   Procedure: COLONOSCOPY WITH PROPOFOL;  Surgeon: Jonathon Bellows, MD;  Location: [facility] ENDOSCOPY;  Service: Gastroenterology;  Laterality: N/A;   CYSTOSCOPY N/A 06/25/2020   Procedure: CYSTOSCOPY;  Surgeon: Homero Fellers, MD;  Location: [facility] ORS;  Service: Gynecology;  Laterality: N/A;   DIAGNOSTIC LAPAROSCOPY     DILATION AND CURETTAGE OF UTERUS     HYSTEROSCOPY WITH D & C N/A 05/29/2020   Procedure: DILATATION AND CURETTAGE /HYSTEROSCOPY;  Surgeon: Homero Fellers, MD;  Location: [facility] ORS;  Service: Gynecology;  Laterality: N/A;   KNEE ARTHROSCOPY WITH MEDIAL MENISECTOMY Left 01/14/2017   Procedure: KNEE ARTHROSCOPY WITH MEDIAL AND LATERAL MENISECTOMY;  Surgeon: Hessie Knows, MD;  Location: [facility] ORS;  Service: Orthopedics;  Laterality: Left;  Partial Knee menisectomy   KNEE SURGERY Left 05/2011   Dr. Rudene Christians- Arthroscopic   LUMBAR LAMINECTOMY/DECOMPRESSION MICRODISCECTOMY N/A 09/16/2016   Procedure: LUMBAR LAMINECTOMY/DECOMPRESSION MICRODISCECTOMY 1 LEVEL L5-S1;  Surgeon: Meade Maw, MD;  Location: [facility] ORS;  Service: Neurosurgery;  Laterality: N/A;   TONSILLECTOMY AND ADENOIDECTOMY     TOTAL LAPAROSCOPIC HYSTERECTOMY WITH SALPINGECTOMY Bilateral 06/25/2020   Procedure: TOTAL LAPAROSCOPIC HYSTERECTOMY WITH BILATERAL SALPINGECTOMY;  Surgeon: Homero Fellers, MD;  Location: [facility] ORS;  Service: Gynecology;  Laterality: Bilateral;   URETHRAL STRICTURE DILATATION     WISDOM TOOTH EXTRACTION      Family Psychiatric History: Reviewed family psychiatric history from progress note on  10/25/2018.  Family History:  Family History  Problem Relation Age of Onset   Dementia Father    Dementia Paternal Aunt    Cancer Paternal Grandmother    Anxiety disorder Cousin    Depression Cousin     Social History: Reviewed social history from progress note on 10/25/2018. Social History   Socioeconomic History   Marital status: Married    Spouse name: John   Number of children: 2   Years of education: 14   Highest education level: Associate degree: occupational, Hotel manager, or vocational program  Occupational History   Occupation: not employed  Tobacco Use   Smoking status: Former    Packs/day: 0.75    Years: 2.00    Pack years: 1.50    Types: Cigarettes    Start date: 07/06/1981    Quit date: 07/07/1983    Years since quitting: 38.0   Smokeless tobacco: Never  Vaping Use   Vaping Use: Never used  Substance and Sexual Activity   Alcohol use: No    Alcohol/week:  0.0 standard drinks   Drug use: No   Sexual activity: Yes    Partners: Male  Other Topics Concern   Not on file  Social History Narrative   Married and husband has a lot of medical problems, he is working again    Investment banker, operational of Radio broadcast assistant Strain: Not on file  Food Insecurity: Not on file  Transportation Needs: Not on file  Physical Activity: Not on file  Stress: Not on file  Social Connections: Not on file    Allergies:  Allergies  Allergen Reactions   Penicillins Anaphylaxis, Hives, Nausea And Vomiting and Swelling    Has patient had a PCN reaction causing immediate rash, facial/tongue/throat swelling, SOB or lightheadedness with hypotension: Yes Has patient had a PCN reaction causing severe rash involving mucus membranes or skin necrosis: Yes Has patient had a PCN reaction that required hospitalization No Has patient had a PCN reaction occurring within the last 10 years: Yes If all of the above answers are "NO", then may proceed with Cephalosporin use.    Chlorhexidine  Gluconate Itching and Rash   Ciprofloxacin Hives   Clindamycin/Lincomycin Hives   Erythromycin Hives and Nausea And Vomiting   Gentamicin Hives   Keflex [Cephalexin] Hives   Lyrica [Pregabalin]     Dizziness, syncope   Nitrofurantoin Monohyd Macro Hives and Nausea And Vomiting   Sulfa Antibiotics Hives and Nausea And Vomiting    Rapid heart rate   Tetracyclines & Related Hives   Adhesive [Tape] Rash   Latex Rash   Vancomycin Itching and Rash    Metabolic Disorder Labs: Lab Results  Component Value Date   HGBA1C 5.6 03/21/2020   MPG 114 03/21/2020   MPG 105 03/31/2019   No results found for: PROLACTIN Lab Results  Component Value Date   CHOL 179 03/21/2020   TRIG 146 03/21/2020   HDL 59 03/21/2020   CHOLHDL 3.0 03/21/2020   VLDL 22 12/31/2016   LDLCALC 95 03/21/2020   LDLCALC 82 06/17/2018   Lab Results  Component Value Date   TSH 2.76 05/12/2021   TSH 4.51 (H) 04/10/2021    Therapeutic Level Labs: No results found for: LITHIUM No results found for: VALPROATE No components found for:  CBMZ  Current Medications: Current Outpatient Medications  Medication Sig Dispense Refill   busPIRone (BUSPAR) 7.5 MG tablet Take 1 tablet (7.5 mg total) by mouth daily. 90 tablet 1   EPINEPHrine 0.3 mg/0.3 mL IJ SOAJ injection Inject 0.3 mg into the muscle as needed for anaphylaxis. Follow package instructions as needed for severe allergy or anaphylactic reaction. 2 each 3   gabapentin (NEURONTIN) 300 MG capsule Take 1-2 capsules (300-600 mg total) by mouth at bedtime. 180 capsule 1   hydrOXYzine (ATARAX) 25 MG tablet Take 1 tablet (25 mg total) by mouth 2 (two) times daily as needed. For anxiety attacks only 180 tablet 1   Insulin Pen Needle 32G X 6 MM MISC 1 each by Does not apply route daily. 100 each 1   levothyroxine (SYNTHROID) 25 MCG tablet Take 1 tablet (25 mcg total) by mouth daily. But take two tablets on Tuesday and Thursday's 114 tablet 1   Liraglutide -Weight  Management (SAXENDA) 18 MG/3ML SOPN Inject 0.6-3 mg into the skin daily. 15 mL 1   mirtazapine (REMERON) 15 MG tablet Take 1 tablet (15 mg total) by mouth at bedtime. Dose increase 90 tablet 1   nystatin (MYCOSTATIN/NYSTOP) powder Apply 1 application topically 3 (three)  times daily. 60 g 0   omeprazole (PRILOSEC) 40 MG capsule Take 1 capsule (40 mg total) by mouth daily. 90 capsule 0   prazosin (MINIPRESS) 1 MG capsule Take 1 capsule (1 mg total) by mouth at bedtime. 90 capsule 1   SUMAtriptan (IMITREX) 100 MG tablet Take 1 tablet (100 mg total) by mouth every 2 (two) hours as needed for migraine. May repeat in 2 hours if headache persists or recurs. 10 tablet 0   topiramate (TOPAMAX) 100 MG tablet Take 1 tablet (100 mg total) by mouth every evening. 90 tablet 1   venlafaxine XR (EFFEXOR-XR) 150 MG 24 hr capsule Take 1 capsule (150 mg total) by mouth every evening. Take with 37.5 mg mg to equal 187.5 mg daily 90 capsule 1   venlafaxine XR (EFFEXOR-XR) 37.5 MG 24 hr capsule Take 1 capsule (37.5 mg total) by mouth daily with breakfast. Take along with 150 mg daily 90 capsule 1   No current facility-administered medications for this visit.     Musculoskeletal: Strength & Muscle Tone:  UTA Gait & Station:  Seated Patient leans: N/A  Psychiatric Specialty Exam: Review of Systems  Musculoskeletal:        Left hip joint pain - chronic - improving  Psychiatric/Behavioral:  Positive for sleep disturbance (due to pain - improving).   All other systems reviewed and are negative.  Last menstrual period 03/07/2020.There is no height or weight on file to calculate BMI.  General Appearance: Casual  Eye Contact:  Good  Speech:  Clear and Coherent  Volume:  Normal  Mood:  Euthymic  Affect:  Congruent  Thought Process:  Goal Directed and Descriptions of Associations: Intact  Orientation:  Full (Time, Place, and Person)  Thought Content: Logical   Suicidal Thoughts:  No  Homicidal Thoughts:  No   Memory:  Immediate;   Fair Recent;   Fair Remote;   Fair  Judgement:  Fair  Insight:  Fair  Psychomotor Activity:  Normal  Concentration:  Concentration: Fair and Attention Span: Fair  Recall:  [employer] of Knowledge: Fair  Language: Fair  Akathisia:  No  Handed:  Right  AIMS (if indicated): not done  Assets:  Communication Skills Desire for Improvement Housing Social Support  ADL's:  Intact  Cognition: WNL  Sleep:   restless, pain, nightmares ( not as intense)    Screenings: GAD-7    Flowsheet Row Video Visit from 07/25/2021 in [facility] Video Visit from 06/13/2021 in [facility] Visit from 05/27/2020 in [facility] Office Visit from 04/12/2020 in [facility] Office Visit from 10/12/2019 in [facility]  Total GAD-7 Score 2 6 8 4 15       PHQ2-9    Flowsheet Row Procedure visit from 07/23/2021 in [facility] Office Visit from 07/21/2021 in [facility] Office Visit from 06/26/2021 in [place] Video Visit from 06/13/2021 in [place] Office Visit from 05/12/2021 in [facility]  PHQ-2 Total Score 0 0 0 1 1  PHQ-9 Total Score -- 0 -- -- 8      Flowsheet Row ED from 05/15/2021 in [place] Video Visit from 02/10/2021 in [facility] No Risk Low Risk        Assessment and Plan: Kelly [name] is a 56 year old female who  has a history of MDD, insomnia, arthritis, GAD was evaluated by telemedicine today.  Patient with better control of her pain is currently improved with regards to her mood and sleep.  She will benefit from the following plan.  Plan MDD in remission Venlafaxine 187.5 mg p.o. daily Mirtazapine 15  mg p.o. nightly  GAD-stable Hydroxyzine 25 mg p.o. twice daily as needed for severe anxiety.  Patient advised not to combine with Benadryl. Venlafaxine 187.5 mg p.o. daily-reduced dosage.  Insomnia-improving Patient with better control of her pain reports sleep is improved. Mirtazapine 15 mg p.o. nightly Prazosin 1 mg p.o. nightly as needed Continue sufficient pain management.  Bereavement-stable Continue grief counseling with her therapist Ms. Marjie Skiff.  Follow-up in clinic in 2 to 3 months or sooner if needed.  This note was generated in part or whole with voice recognition software. Voice recognition is usually quite accurate but there are transcription errors that can and very often do occur. I apologize for any typographical errors that were not detected and corrected.      Ursula Alert, MD 07/25/2021, 11:58 AM

## 2021-07-31 ENCOUNTER — Encounter: Payer: Self-pay | Admitting: Infectious Diseases

## 2021-07-31 ENCOUNTER — Ambulatory Visit: Payer: Commercial Managed Care - PPO | Attending: Infectious Diseases | Admitting: Infectious Diseases

## 2021-07-31 ENCOUNTER — Other Ambulatory Visit: Payer: Self-pay

## 2021-07-31 VITALS — BP 128/79 | HR 84 | Temp 98.7°F | Resp 16 | Ht 64.0 in | Wt 220.0 lb

## 2021-07-31 DIAGNOSIS — N390 Urinary tract infection, site not specified: Secondary | ICD-10-CM

## 2021-07-31 DIAGNOSIS — Z87442 Personal history of urinary calculi: Secondary | ICD-10-CM | POA: Diagnosis not present

## 2021-07-31 DIAGNOSIS — Z8041 Family history of malignant neoplasm of ovary: Secondary | ICD-10-CM | POA: Insufficient documentation

## 2021-07-31 DIAGNOSIS — Z90710 Acquired absence of both cervix and uterus: Secondary | ICD-10-CM | POA: Diagnosis not present

## 2021-07-31 DIAGNOSIS — B9620 Unspecified Escherichia coli [E. coli] as the cause of diseases classified elsewhere: Secondary | ICD-10-CM | POA: Insufficient documentation

## 2021-07-31 DIAGNOSIS — N3090 Cystitis, unspecified without hematuria: Secondary | ICD-10-CM | POA: Diagnosis not present

## 2021-07-31 DIAGNOSIS — N83202 Unspecified ovarian cyst, left side: Secondary | ICD-10-CM | POA: Diagnosis not present

## 2021-07-31 DIAGNOSIS — N958 Other specified menopausal and perimenopausal disorders: Secondary | ICD-10-CM | POA: Diagnosis not present

## 2021-07-31 DIAGNOSIS — Z79899 Other long term (current) drug therapy: Secondary | ICD-10-CM | POA: Diagnosis not present

## 2021-07-31 DIAGNOSIS — Z881 Allergy status to other antibiotic agents status: Secondary | ICD-10-CM | POA: Diagnosis not present

## 2021-07-31 DIAGNOSIS — M549 Dorsalgia, unspecified: Secondary | ICD-10-CM | POA: Diagnosis not present

## 2021-07-31 DIAGNOSIS — Z87440 Personal history of urinary (tract) infections: Secondary | ICD-10-CM | POA: Insufficient documentation

## 2021-07-31 DIAGNOSIS — N951 Menopausal and female climacteric states: Secondary | ICD-10-CM | POA: Diagnosis not present

## 2021-07-31 DIAGNOSIS — Z9079 Acquired absence of other genital organ(s): Secondary | ICD-10-CM | POA: Diagnosis not present

## 2021-07-31 NOTE — Progress Notes (Signed)
NAME: Kelly [name]  DOB: 10-30-1965  MRN: [id number]  Date/Time: 07/31/2021 10:31 AM   Patient is here with her mother. Kelly [name] is a 56 y.o. 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 She has multiple allergies to antibiotics and does not tolerate , so she is referred to me She has some burning, and occasional frequency- these are here symptoms for many months - She thinks she passed stones recently and that caused pain- She has a follow up appt with urologist next qyear now with not much of improvement I had seen her in 2021 for the same complaint and diagnose d her with Genitourinary syndrome of menopause and E.coli colonization UA analysis done on 1/16 /23 showed no nitrites and trace leucocytes She has never had a documented fever . She says she has had temp upto 100 in the past.  Some chills occasionally. On 07/21/21 she got a urine culture which showed E. coli and she has been referred to me. On 07/23/2021 she saw pain management specialist and received steroid injection for the back pain. On 05/15/2021 patient went to the day surgery to receive vancomycin for the same E. coli from the left flushing of her face hives and had to go to the ED.  She received Benadryl epinephrine and prednisone. Patient is concerned that she has a left ovarian cyst of 4 cm in the CT done in NOV 2021.  She states that her grandmother died of ovarian cancer. She will see her OB soon  She had severe postmenopausal bleeding in September 2021. Had hematometra for which she had to  undergo dilatation of the cervix 03/15/20.December 2021 patient underwent laparoscopic hysterectomy with bilateral salpingectomy I Past Medical History:  Diagnosis Date   Allergy    Anxiety    Depression    GERD (gastroesophageal  reflux disease)    History of kidney stones    Insomnia    Intermittent low back pain    Migraines    Osteoarthritis    Recurrent UTI    Restless leg syndrome    Sciatica of right side    left side   Symptomatic menopausal or female climacteric states    Vertigo    Vitamin D deficiency     Past Surgical History:  Procedure Laterality Date   ABDOMINAL HYSTERECTOMY     BACK SURGERY     COLONOSCOPY WITH PROPOFOL N/A 02/03/2019   Procedure: COLONOSCOPY WITH PROPOFOL;  Surgeon: Jonathon Bellows, MD;  Location: [facility] ENDOSCOPY;  Service: Gastroenterology;  Laterality: N/A;   COLONOSCOPY WITH PROPOFOL N/A 03/10/2019   Procedure: COLONOSCOPY WITH PROPOFOL;  Surgeon: Jonathon Bellows, MD;  Location: [facility] ENDOSCOPY;  Service: Gastroenterology;  Laterality: N/A;   CYSTOSCOPY N/A 06/25/2020   Procedure: CYSTOSCOPY;  Surgeon: Homero Fellers, MD;  Location: [facility] ORS;  Service: Gynecology;  Laterality: N/A;   DIAGNOSTIC LAPAROSCOPY     DILATION AND CURETTAGE OF UTERUS     HYSTEROSCOPY WITH D & C N/A 05/29/2020   Procedure: DILATATION AND CURETTAGE /HYSTEROSCOPY;  Surgeon: Homero Fellers, MD;  Location: [facility] ORS;  Service: Gynecology;  Laterality: N/A;   KNEE ARTHROSCOPY WITH MEDIAL MENISECTOMY Left 01/14/2017   Procedure: KNEE ARTHROSCOPY WITH MEDIAL AND LATERAL MENISECTOMY;  Surgeon: Hessie Knows, MD;  Location: [facility] ORS;  Service: Orthopedics;  Laterality: Left;  Partial Knee menisectomy   KNEE SURGERY Left 05/2011   Dr. Rudene Christians- Arthroscopic   LUMBAR LAMINECTOMY/DECOMPRESSION MICRODISCECTOMY N/A 09/16/2016   Procedure: LUMBAR LAMINECTOMY/DECOMPRESSION MICRODISCECTOMY 1 LEVEL L5-S1;  Surgeon: Meade Maw, MD;  Location: [facility] ORS;  Service: Neurosurgery;  Laterality: N/A;   TONSILLECTOMY AND ADENOIDECTOMY     TOTAL LAPAROSCOPIC HYSTERECTOMY WITH SALPINGECTOMY Bilateral 06/25/2020   Procedure: TOTAL LAPAROSCOPIC HYSTERECTOMY WITH BILATERAL SALPINGECTOMY;  Surgeon: Homero Fellers,  MD;  Location: [facility] ORS;  Service: Gynecology;  Laterality: Bilateral;   URETHRAL STRICTURE DILATATION     WISDOM TOOTH EXTRACTION      Social History   Socioeconomic History   Marital status: Married    Spouse name: John   Number of children: 2   Years of education: 14   Highest education level: Associate degree: occupational, Hotel manager, or vocational program  Occupational History   Occupation: not employed  Tobacco Use   Smoking status: Former    Packs/day: 0.75    Years: 2.00    Pack years: 1.50    Types: Cigarettes    Start date: 07/06/1981    Quit date: 07/07/1983    Years since quitting: 38.0   Smokeless tobacco: Never  Vaping Use   Vaping Use: Never used  Substance and Sexual Activity   Alcohol use: No    Alcohol/week: 0.0 standard drinks   Drug use: No   Sexual activity: Yes    Partners: Male  Other Topics Concern   Not on file  Social History Narrative   Married and husband has a lot of medical problems, he is working again    Investment banker, operational of Radio broadcast assistant Strain: Not on file  Food Insecurity: Not on file  Transportation Needs: Not on file  Physical Activity: Not on file  Stress: Not on file  Social Connections: Not on file  Intimate Partner Violence: Not on file    Family History  Problem Relation Age of Onset   Dementia Father    Dementia Paternal Aunt    Cancer Paternal Grandmother    Anxiety disorder Cousin    Depression Cousin    Allergies  Allergen Reactions   Penicillins Anaphylaxis, Hives, Nausea And Vomiting and Swelling    Has patient had a PCN reaction causing immediate rash, facial/tongue/throat swelling, SOB or lightheadedness with hypotension: Yes Has patient had a PCN reaction causing severe rash involving mucus membranes or skin necrosis: Yes Has patient had a PCN reaction that required hospitalization No Has patient had a PCN reaction occurring within the last 10 years: Yes If all of the above answers are "NO",  then may proceed with Cephalosporin use.    Chlorhexidine Gluconate Itching and Rash   Ciprofloxacin Hives   Clindamycin/Lincomycin Hives   Erythromycin Hives and Nausea And Vomiting   Gentamicin Hives   Keflex [Cephalexin] Hives   Lyrica [Pregabalin]     Dizziness, syncope   Nitrofurantoin Monohyd Macro Hives and Nausea And Vomiting   Sulfa Antibiotics Hives and Nausea And Vomiting    Rapid heart rate   Tetracyclines & Related Hives   Adhesive [Tape] Rash   Latex Rash   Vancomycin Itching and Rash   I? Current Outpatient Medications  Medication Sig Dispense Refill   busPIRone (BUSPAR) 7.5 MG tablet Take 1 tablet (7.5 mg total) by mouth daily. 90 tablet 1   gabapentin (NEURONTIN) 300 MG capsule Take 1-2 capsules (300-600 mg total) by mouth at bedtime. 180 capsule 1  hydrOXYzine (ATARAX) 25 MG tablet Take 1 tablet (25 mg total) by mouth 2 (two) times daily as needed. For anxiety attacks only 180 tablet 1   Insulin Pen Needle 32G X 6 MM MISC 1 each by Does not apply route daily. 100 each 1   levothyroxine (SYNTHROID) 25 MCG tablet Take 1 tablet (25 mcg total) by mouth daily. But take two tablets on Tuesday and Thursday's 114 tablet 1   Liraglutide -Weight Management (SAXENDA) 18 MG/3ML SOPN Inject 0.6-3 mg into the skin daily. 15 mL 1   mirtazapine (REMERON) 15 MG tablet Take 1 tablet (15 mg total) by mouth at bedtime. Dose increase 90 tablet 1   nystatin (MYCOSTATIN/NYSTOP) powder Apply 1 application topically 3 (three) times daily. 60 g 0   omeprazole (PRILOSEC) 40 MG capsule Take 1 capsule (40 mg total) by mouth daily. 90 capsule 0   prazosin (MINIPRESS) 1 MG capsule Take 1 capsule (1 mg total) by mouth at bedtime. 90 capsule 1   SUMAtriptan (IMITREX) 100 MG tablet Take 1 tablet (100 mg total) by mouth every 2 (two) hours as needed for migraine. May repeat in 2 hours if headache persists or recurs. 10 tablet 0   topiramate (TOPAMAX) 100 MG tablet Take 1 tablet (100 mg total) by  mouth every evening. 90 tablet 1   venlafaxine XR (EFFEXOR-XR) 150 MG 24 hr capsule Take 1 capsule (150 mg total) by mouth every evening. Take with 37.5 mg mg to equal 187.5 mg daily 90 capsule 1   venlafaxine XR (EFFEXOR-XR) 37.5 MG 24 hr capsule Take 1 capsule (37.5 mg total) by mouth daily with breakfast. Take along with 150 mg daily 90 capsule 1   EPINEPHrine 0.3 mg/0.3 mL IJ SOAJ injection Inject 0.3 mg into the muscle as needed for anaphylaxis. Follow package instructions as needed for severe allergy or anaphylactic reaction. (Patient not taking: Reported on 07/31/2021) 2 each 3   No current facility-administered medications for this visit.     Abtx:  Anti-infectives (From admission, onward)    None       REVIEW OF SYSTEMS:  Const: negative fever, some  chills, 10 pounds due to saxenda Eyes: negative diplopia or visual changes, negative eye pain ENT: negative coryza, negative sore throat Resp: negative cough, hemoptysis, dyspnea Cards: negative for chest pain, palpitations, lower extremity edema GU: Has dysuria and sometimes frequency, no flank pain GI: Negative for abdominal pain, diarrhea, bleeding, constipation Skin: negative for rash and pruritus Heme: negative for easy bruising and gum/nose bleeding MS: , back pain and hip pain,better with steroid injection Neurolo:negative for headaches, dizziness, vertigo, memory problems  Psych:  anxiety Endocrine: negative for thyroid, diabetes Allergy/Immunology- multiple allergies ?  Objective:  VITALS:  BP 128/79    Pulse 84    Temp 98.7 F (37.1 C) (Oral)    Resp 16    Ht 5\' 4"  (1.626 m)    Wt 220 lb (99.8 kg)    LMP 03/07/2020    SpO2 97%    BMI 37.76 kg/m  PHYSICAL EXAM:  General: Alert, cooperative, no distress, appears stated age.  Head: Normocephalic, without obvious abnormality, atraumatic. Eyes: Conjunctivae clear, anicteric sclerae. Pupils are equal ENT Nares normal. No drainage or sinus tenderness. Lips, mucosa,  and tongue normal. No Thrush Neck: Supple, symmetrical, no adenopathy, thyroid: non tender no carotid bruit and no JVD. Back: No CVA tenderness. Lungs: Clear to auscultation bilaterally. No Wheezing or Rhonchi. No rales. Heart: Regular rate and rhythm, no murmur,  rub or gallop. Abdomen: Soft, non-tender,not distended. Bowel sounds normal. No masses Extremities: atraumatic, no cyanosis. No edema. No clubbing Skin: No rashes or lesions. Or bruising Lymph: Cervical, supraclavicular normal. Neurologic: Grossly non-focal Pertinent Labs  Microbiology: Recent Results (from the past 240 hour(s))  Urine Culture     Status: Abnormal   Collection Time: 07/21/21  2:01 PM   Specimen: Urine  Result Value Ref Range Status   MICRO NUMBER: [id number]  Final   SPECIMEN QUALITY: Adequate  Final   Sample Source URINE  Final   STATUS: FINAL  Final   ISOLATE 1: Escherichia coli (A)  Final    Comment: Greater than 100,000 CFU/mL of Escherichia coli      Susceptibility   Escherichia coli - URINE CULTURE, REFLEX    AMOX/CLAVULANIC 8 Sensitive     AMPICILLIN >=32 Resistant     AMPICILLIN/SULBACTAM 16 Intermediate     CEFAZOLIN* <=4 Not Reportable      * For infections other than uncomplicated UTI caused by E. coli, K. pneumoniae or P. mirabilis: Cefazolin is resistant if MIC > or = 8 mcg/mL. (Distinguishing susceptible versus intermediate for isolates with MIC < or = 4 mcg/mL requires additional testing.) For uncomplicated UTI caused by E. coli, K. pneumoniae or P. mirabilis: Cefazolin is susceptible if MIC <32 mcg/mL and predicts susceptible to the oral agents cefaclor, cefdinir, cefpodoxime, cefprozil, cefuroxime, cephalexin and loracarbef.     CEFTAZIDIME <=1 Sensitive     CEFEPIME <=1 Sensitive     CEFTRIAXONE <=1 Sensitive     CIPROFLOXACIN 0.5 Intermediate     LEVOFLOXACIN 1 Intermediate     GENTAMICIN <=1 Sensitive     IMIPENEM <=0.25 Sensitive     NITROFURANTOIN <=16 Sensitive      PIP/TAZO <=4 Sensitive     TOBRAMYCIN <=1 Sensitive     TRIMETH/SULFA* <=20 Sensitive      * For infections other than uncomplicated UTI caused by E. coli, K. pneumoniae or P. mirabilis: Cefazolin is resistant if MIC > or = 8 mcg/mL. (Distinguishing susceptible versus intermediate for isolates with MIC < or = 4 mcg/mL requires additional testing.) For uncomplicated UTI caused by E. coli, K. pneumoniae or P. mirabilis: Cefazolin is susceptible if MIC <32 mcg/mL and predicts susceptible to the oral agents cefaclor, cefdinir, cefpodoxime, cefprozil, cefuroxime, cephalexin and loracarbef. Legend: S = Susceptible  I = Intermediate R = Resistant  NS = Not susceptible * = Not tested  NR = Not reported **NN = See antimicrobic comments      Impression/Recommendation Colonization with E. coli  has been treated multiple times for UTI.  Recently got gentamicin 1 dose in November 2022 and developed hives. Her symptoms are very minimal.  She has some dysuria which has been chronic.  And frequency sometimes. She has history of renal stones.  No obstruction No systemic infection in the past No hospitalization for sepsis or complicated UTI Because of multiple antibiotic allergies but only treat when it is really an acquired.  If she has to undergo cystoscopy or she gets systemic symptoms including fever of 100.6 then would treat. But dysuria could also be secondary to Genitourinary syndrome of menopause. Advised the patient to follow the below instructions *Estrace /Premarin cream topically- peasized apply topically three times a week * Cetaphil to clean the genital area ( not soap)  * Cranberry supplement (-Knudsen cranberry concentrate- 1 ounce mixed with 8 ounces of water *wash with water after bowel movt *Probiotic for vaginal health( can  try Pearls vaginal health) * Increase water consumption- 8 glasses a day *Ask your doctors not to check your urine on a routine basis  *Avoid  antibiotics unless systemic infection/ or before cystoscopy * Kegel Exercise to strengthen pelvic floor  Patient drinks 2X 20 ounces of Dr. Wilhemina Bonito every day.  Told her not to drink even if it is diet soda because of the risk for renal stones.  Multiple antibiotic allergy. Santana side effects She has been seen allergy documented as a child If needed can do allergy testing For simple cystitis fosfomycin can be used Patient has an appointment with urology next week for the renal stones. Discussed with the patient and mother in great detail Follow-up as needed ___________________________________________________  Note:  This document was prepared using Dragon voice recognition software and may include unintentional dictation errors.

## 2021-07-31 NOTE — Patient Instructions (Signed)
You are here for recurrent UTI,you are colonized with e.coli , your symptoms of burning can be due to [facility] urinary syndrome of menopause Please follow the below  *Estrace /Premarin cream topically- peasized apply topically three times a week * Cetaphil to clean the genital area ( not soap)  * Cranberry supplement (-Knudsen cranberry concentrate- 1 ounce mixed with 8 ounces of water *wash with water after bowel movt *Probiotic for vaginal health( can try Pearls vaginal health) * Increase water consumption- 8 glasses a day *Ask your doctors not to check your urine on a routine basis  *Avoid antibiotics unless you are going for cystoscopy, or fever >100.6,   * Kegel Exercise to strengthen pelvic floor Stop soft drinks -  Antibiotics do not treat colonization It will only cause side effects and allergic reactions Follow prn

## 2021-08-04 ENCOUNTER — Other Ambulatory Visit: Payer: Self-pay

## 2021-08-04 ENCOUNTER — Ambulatory Visit (INDEPENDENT_AMBULATORY_CARE_PROVIDER_SITE_OTHER): Payer: Commercial Managed Care - PPO | Admitting: Obstetrics and Gynecology

## 2021-08-04 ENCOUNTER — Encounter: Payer: Self-pay | Admitting: Obstetrics and Gynecology

## 2021-08-04 VITALS — BP 128/70 | Ht 64.0 in | Wt 218.4 lb

## 2021-08-04 DIAGNOSIS — Z01419 Encounter for gynecological examination (general) (routine) without abnormal findings: Secondary | ICD-10-CM

## 2021-08-04 DIAGNOSIS — Z1239 Encounter for other screening for malignant neoplasm of breast: Secondary | ICD-10-CM | POA: Diagnosis not present

## 2021-08-04 NOTE — Patient Instructions (Signed)
[employer] Recommended Dietary Allowances for Calcium and Vitamin D  Age (yr) Calcium Recommended Dietary Allowance (mg/day) Vitamin D Recommended Dietary Allowance (international units/day)  9-18 1,300 600  19-50 1,000 600  51-70 1,200 600  71 and older 1,200 800  Data from [employer]. Dietary reference intakes: calcium, vitamin D. [place], [place]: National Academies [employer]; 2011.   Exercising to Stay Healthy To become healthy and stay healthy, it is recommended that you do moderate-intensity and vigorous-intensity exercise. You can tell that you are exercising at a moderate intensity if your heart starts beating faster and you start breathing faster but can still hold a conversation. You can tell that you are exercising at a vigorous intensity if you are breathing much harder and faster and cannot hold a conversation while exercising. How can exercise benefit me? Exercising regularly is important. It has many health benefits, such as: Improving overall fitness, flexibility, and endurance. Increasing bone density. Helping with weight control. Decreasing body fat. Increasing muscle strength and endurance. Reducing stress and tension, anxiety, depression, or anger. Improving overall health. What guidelines should I follow while exercising? Before you start a new exercise program, talk with your health care provider. Do not exercise so much that you hurt yourself, feel dizzy, or get very short of breath. Wear comfortable clothes and wear shoes with good support. Drink plenty of water while you exercise to prevent dehydration or heat stroke. Work out until your breathing and your heartbeat get faster (moderate intensity). How often should I exercise? Choose an activity that you enjoy, and set realistic goals. Your health care provider can help you make an activity plan that is individually designed and works best for you. Exercise regularly as told by your health  care provider. This may include: Doing strength training two times a week, such as: Lifting weights. Using resistance bands. Push-ups. Sit-ups. Yoga. Doing a certain intensity of exercise for a given amount of time. Choose from these options: A total of 150 minutes of moderate-intensity exercise every week. A total of 75 minutes of vigorous-intensity exercise every week. A mix of moderate-intensity and vigorous-intensity exercise every week. Children, pregnant women, people who have not exercised regularly, people who are overweight, and older adults may need to talk with a health care provider about what activities are safe to perform. If you have a medical condition, be sure to talk with your health care provider before you start a new exercise program. What are some exercise ideas? Moderate-intensity exercise ideas include: Walking 1 mile (1.6 km) in about 15 minutes. Biking. Hiking. Golfing. Dancing. Water aerobics. Vigorous-intensity exercise ideas include: Walking 4.5 miles (7.2 km) or more in about 1 hour. Jogging or running 5 miles (8 km) in about 1 hour. Biking 10 miles (16.1 km) or more in about 1 hour. Lap swimming. Roller-skating or in-line skating. Cross-country skiing. Vigorous competitive sports, such as football, basketball, and soccer. Jumping rope. Aerobic dancing. What are some everyday activities that can help me get exercise? Yard work, such as: Pushing a lawn mower. Raking and bagging leaves. Washing your car. Pushing a stroller. Shoveling snow. Gardening. Washing windows or floors. How can I be more active in my day-to-day activities? Use stairs instead of an elevator. Take a walk during your lunch break. If you drive, park your car farther away from your work or school. If you take public transportation, get off one stop early and walk the rest of the way. Stand up or walk around during all of   your indoor phone calls. Get up, stretch, and walk  around every 30 minutes throughout the day. Enjoy exercise with a friend. Support to continue exercising will help you keep a regular routine of activity. Where to find more information You can find more information about exercising to stay healthy from: [place] Department of Health and Human Services: www.hhs.gov Centers for Disease Control and Prevention (CDC): www.cdc.gov Summary Exercising regularly is important. It will improve your overall fitness, flexibility, and endurance. Regular exercise will also improve your overall health. It can help you control your weight, reduce stress, and improve your bone density. Do not exercise so much that you hurt yourself, feel dizzy, or get very short of breath. Before you start a new exercise program, talk with your health care provider. This information is not intended to replace advice given to you by your health care provider. Make sure you discuss any questions you have with your health care provider. Document Revised: 10/18/2020 Document Reviewed: 10/18/2020 Elsevier Patient Education  2022 [employer]. Budget-Friendly Healthy Eating There are many ways to save money at the grocery store and continue to eat healthy. You can be successful if you: Plan meals according to your budget. Make a grocery list and only purchase food according to your grocery list. Prepare food yourself at home. What are tips for following this plan? Reading food labels Compare food labels between brand name foods and the store brand. Often the nutritional value is the same, but the store brand is lower cost. Look for products that do not have added sugar, fat, or salt (sodium). These often cost the same but are healthier for you. Products may be labeled as: Sugar-free. Nonfat. Low-fat. Sodium-free. Low-sodium. Look for lean ground beef labeled as at least 92% lean and 8% fat. Shopping  Buy only the items on your grocery list and go only to the areas of the store  that have the items on your list. Use coupons only for foods and brands you normally buy. Avoid buying items you wouldn't normally buy simply because they are on sale. Check online and in newspapers for weekly deals. Buy healthy items from the bulk bins when available, such as herbs, spices, flour, pasta, nuts, and dried fruit. Buy fruits and vegetables that are in season. Prices are usually lower on in-season produce. Look at the unit price on the price tag. Use it to compare different brands and sizes to find out which item is the best deal. Choose healthy items that are often low-cost, such as carrots, potatoes, apples, bananas, and oranges. Dried or canned beans are a low-cost protein source. Buy in bulk and freeze extra food. Items you can buy in bulk include meats, fish, poultry, frozen fruits, and frozen vegetables. Avoid buying "ready-to-eat" foods, such as pre-cut fruits and vegetables and pre-made salads. If possible, shop around to discover where you can find the best prices. Consider other retailers such as dollar stores, larger wholesale stores, local fruit and vegetable stands, and farmers markets. Do not shop when you are hungry. If you shop while hungry, it may be hard to stick to your list and budget. Resist impulse buying. Use your grocery list as your official plan for the week. Buy a variety of vegetables and fruits by purchasing fresh, frozen, and canned items. Look at the top and bottom shelves for deals. Foods at eye level (eye level of an adult or child) are usually more expensive. Be efficient with your time when shopping. The more time you   spend at the store, the more money you are likely to spend. To save money when choosing more expensive foods like meats and dairy: Choose cheaper cuts of meat, such as bone-in chicken thighs and drumsticks instead of skinless and boneless chicken. When you are ready to prepare the chicken, you can remove the skin yourself to make it  healthier. Choose lean meats like chicken or turkey instead of beef. Choose canned seafood, such as tuna, salmon, or sardines. Buy eggs as a low-cost source of protein. Buy dried beans and peas, such as lentils, split peas, or kidney beans instead of meats. Dried beans and peas are a good alternative source of protein. Buy the larger tubs of yogurt instead of individual-sized containers. Choose water instead of sodas and other sweetened beverages. Avoid buying chips, cookies, and other "junk food." These items are usually expensive and not healthy. Cooking Make extra food and freeze the extras in meal-sized containers or in individual portions for fast meals and snacks. Pre-cook on days when you have extra time to prepare meals in advance. You can keep these meals in the fridge or freezer and reheat for a quick meal. When you come home from the grocery store, wash, peel, and cut fruits and vegetables so they are ready to use and eat. This will help reduce food waste. Meal planning Do not eat out or get fast food. Prepare food at home. Make a grocery list and make sure to bring it with you to the store. If you have a smart phone, you could use your phone to create your shopping list. Plan meals and snacks according to a grocery list and budget you create. Use leftovers in your meal plan for the week. Look for recipes where you can cook once and make enough food for two meals. Prepare budget-friendly types of meals like stews, casseroles, and stir-fry dishes. Try some meatless meals or try "no cook" meals like salads. Make sure that half your plate is filled with fruits or vegetables. Choose from fresh, frozen, or canned fruits and vegetables. If eating canned, remember to rinse them before eating. This will remove any excess salt added for packaging. Summary Eating healthy on a budget is possible if you plan your meals according to your budget, purchase according to your budget and grocery list,  and prepare food yourself. Tips for buying more food on a limited budget include buying generic brands, using coupons only for foods you normally buy, and buying healthy items from the bulk bins when available. Tips for buying cheaper food to replace expensive food include choosing cheaper, lean cuts of meat, and buying dried beans and peas. This information is not intended to replace advice given to you by your health care provider. Make sure you discuss any questions you have with your health care provider. Document Revised: 04/04/2020 Document Reviewed: 04/04/2020 Elsevier Patient Education  2022 [employer]. Bone Health Bones protect organs, store calcium, anchor muscles, and support the whole body. Keeping your bones strong is important, especially as you get older. You can take actions to help keep your bones strong and healthy. Why is keeping my bones healthy important? Keeping your bones healthy is important because your body constantly replaces bone cells. Cells get old, and new cells take their place. As we age, we lose bone cells because the body may not be able to make enough new cells to replace the old cells. The amount of bone cells and bone tissue you have is referred to as   bone mass. The higher your bone mass, the stronger your bones. The aging process leads to an overall loss of bone mass in the body, which can increase the likelihood of: Broken bones. A condition in which the bones become weak and brittle (osteoporosis). A large decline in bone mass occurs in older adults. In women, it occurs about the time of menopause. What actions can I take to keep my bones healthy? Good health habits are important for maintaining healthy bones. This includes eating nutritious foods and exercising regularly. To have healthy bones, you need to get enough of the right minerals and vitamins. Most nutrition experts recommend getting these nutrients from the foods that you eat. In some cases,  taking supplements may also be recommended. Doing certain types of exercise is also important for bone health. What are the nutritional recommendations for healthy bones? Eating a well-balanced diet with plenty of calcium and vitamin D will help to protect your bones. Nutritional recommendations vary from person to person. Ask your health care provider what is healthy for you. Here are some general guidelines. Get enough calcium Calcium is the most important (essential) mineral for bone health. Most people can get enough calcium from their diet, but supplements may be recommended for people who are at risk for osteoporosis. Good sources of calcium include: Dairy products, such as low-fat or nonfat milk, cheese, and yogurt. Dark green leafy vegetables, such as bok choy and broccoli. Foods that have calcium added to them (are fortified). Foods that may be fortified with calcium include orange juice, cereal, bread, soy beverages, and tofu products. Nuts, such as almonds. Follow these recommended amounts for daily calcium intake: Infants, 0-6 months: 200 mg. Infants, 6-12 months: 260 mg. Children, age 1-3: 700 mg. Children, age 4-8: 1,000 mg. Children, age 9-13: 1,300 mg. Teens, age 14-18: 1,300 mg. Adults, age 19-50: 1,000 mg. Adults, age 51-70: Men: 1,000 mg. Women: 1,200 mg. Adults, age 71 or older: 1,200 mg. Pregnant and breastfeeding females: Teens: 1,300 mg. Adults: 1,000 mg. Get enough vitamin D Vitamin D is the most essential vitamin for bone health. It helps the body absorb calcium. Sunlight stimulates the skin to make vitamin D, so be sure to get enough sunlight. If you live in a cold climate or you do not get outside often, your health care provider may recommend that you take vitamin D supplements. Good sources of vitamin D in your diet include: Egg yolks. Saltwater fish. Milk and cereal fortified with vitamin D. Follow these recommended amounts for daily vitamin D  intake: Infants, 0-12 months: 400 international units (IU). Children and teens, age 1-18: 600 international units. Adults, age 59 or younger: 600 international units. Adults, age 60 or older: 600-1,000 international units. Get other important nutrients Other nutrients that are important for bone health include: Phosphorus. This mineral is found in meat, poultry, dairy foods, nuts, and legumes. The recommended daily intake for adult men and adult women is 700 mg. Magnesium. This mineral is found in seeds, nuts, dark green vegetables, and legumes. The recommended daily intake for adult men is 400-420 mg. For adult women, it is 310-320 mg. Vitamin K. This vitamin is found in green leafy vegetables. The recommended daily intake is 120 mcg for adult men and 90 mcg for adult women. What type of physical activity is best for building and maintaining healthy bones? Weight-bearing and strength-building activities are important for building and maintaining healthy bones. Weight-bearing activities cause muscles and bones to work against gravity. Strength-building activities   increase the strength of the muscles that support bones. Weight-bearing and muscle-building activities include: Walking and hiking. Jogging and running. Dancing. Gym exercises. Lifting weights. Tennis and racquetball. Climbing stairs. Aerobics. Adults should get at least 30 minutes of moderate physical activity on most days. Children should get at least 60 minutes of moderate physical activity on most days. Ask your health care provider what type of exercise is best for you. How can I find out if my bone mass is low? Bone mass can be measured with an X-ray test called a bone mineral density (BMD) test. This test is recommended for all women who are age 65 or older. It may also be recommended for: Men who are age 70 or older. People who are at risk for osteoporosis because of: Having a long-term disease that weakens bones, such as  kidney disease or rheumatoid arthritis. Having menopause earlier than normal. Taking medicine that weakens bones, such as steroids, thyroid hormones, or hormone treatment for breast cancer or prostate cancer. Smoking. Drinking three or more alcoholic drinks a day. Being underweight. Sedentary lifestyle. If you find that you have a low bone mass, you may be able to prevent osteoporosis or further bone loss by changing your diet and lifestyle. Where can I find more information? Bone Health & Osteoporosis Foundation: www.nof.org/patients National Institutes of Health: www.bones.nih.gov International Osteoporosis Foundation: www.iofbonehealth.org Summary The aging process leads to an overall loss of bone mass in the body, which can increase the likelihood of broken bones and osteoporosis. Eating a well-balanced diet with plenty of calcium and vitamin D will help to protect your bones. Weight-bearing and strength-building activities are also important for building and maintaining strong bones. Bone mass can be measured with an X-ray test called a bone mineral density (BMD) test. This information is not intended to replace advice given to you by your health care provider. Make sure you discuss any questions you have with your health care provider. Document Revised: 12/04/2020 Document Reviewed: 12/04/2020 Elsevier Patient Education  2022 [employer] Inc.  

## 2021-08-04 NOTE — Progress Notes (Signed)
Gynecology Annual Exam  PCP: Steele Sizer, MD  Chief Complaint:  Chief Complaint  Patient presents with   Gynecologic Exam    Annual exam    History of Present Illness: Patient is a 56 y.o. [id number] presents for annual exam. The patient has no complaints today.   LMP: Patient's last menstrual period was 03/07/2020. She denies postmenopausal bleeding or spotting  The patient is not currently sexually active. She denies dyspareunia.  Postcoital Bleeding: no   The patient does perform self breast exams.  There is notable family history of breast or ovarian cancer in her family.  The patient has regular exercise: walking daily  The patient denies current symptoms of depression.   PHQ-9: 2 GAD-7: 1   Review of Systems: Review of Systems  Constitutional:  Positive for malaise/fatigue. Negative for chills, fever and weight loss.  HENT:  Negative for congestion, hearing loss and sinus pain.   Eyes:  Negative for blurred vision and double vision.  Respiratory:  Negative for cough, sputum production, shortness of breath and wheezing.   Cardiovascular:  Negative for chest pain, palpitations, orthopnea and leg swelling.  Gastrointestinal:  Negative for abdominal pain, constipation, diarrhea, nausea and vomiting.  Genitourinary:  Positive for dysuria and frequency. Negative for flank pain and hematuria.  Musculoskeletal:  Positive for joint pain and myalgias. Negative for back pain and falls.  Skin:  Negative for itching and rash.  Neurological:  Positive for weakness. Negative for dizziness and headaches.  Psychiatric/Behavioral:  Positive for depression. Negative for substance abuse and suicidal ideas. The patient is nervous/anxious.    Past Medical History:  Past Medical History:  Diagnosis Date   Allergy    Anxiety    Depression    GERD (gastroesophageal reflux disease)    History of kidney stones    Insomnia    Intermittent low back pain    Migraines     Osteoarthritis    Recurrent UTI    Restless leg syndrome    Sciatica of right side    left side   Symptomatic menopausal or female climacteric states    Vertigo    Vitamin D deficiency     Past Surgical History:  Past Surgical History:  Procedure Laterality Date   ABDOMINAL HYSTERECTOMY     BACK SURGERY     COLONOSCOPY WITH PROPOFOL N/A 02/03/2019   Procedure: COLONOSCOPY WITH PROPOFOL;  Surgeon: Jonathon Bellows, MD;  Location: [facility] ENDOSCOPY;  Service: Gastroenterology;  Laterality: N/A;   COLONOSCOPY WITH PROPOFOL N/A 03/10/2019   Procedure: COLONOSCOPY WITH PROPOFOL;  Surgeon: Jonathon Bellows, MD;  Location: [facility] ENDOSCOPY;  Service: Gastroenterology;  Laterality: N/A;   CYSTOSCOPY N/A 06/25/2020   Procedure: CYSTOSCOPY;  Surgeon: Homero Fellers, MD;  Location: [facility] ORS;  Service: Gynecology;  Laterality: N/A;   DIAGNOSTIC LAPAROSCOPY     DILATION AND CURETTAGE OF UTERUS     HYSTEROSCOPY WITH D & C N/A 05/29/2020   Procedure: DILATATION AND CURETTAGE /HYSTEROSCOPY;  Surgeon: Homero Fellers, MD;  Location: [facility] ORS;  Service: Gynecology;  Laterality: N/A;   KNEE ARTHROSCOPY WITH MEDIAL MENISECTOMY Left 01/14/2017   Procedure: KNEE ARTHROSCOPY WITH MEDIAL AND LATERAL MENISECTOMY;  Surgeon: Hessie Knows, MD;  Location: [facility] ORS;  Service: Orthopedics;  Laterality: Left;  Partial Knee menisectomy   KNEE SURGERY Left 05/2011   Dr. Rudene Christians- Arthroscopic   LUMBAR LAMINECTOMY/DECOMPRESSION MICRODISCECTOMY N/A 09/16/2016   Procedure: LUMBAR LAMINECTOMY/DECOMPRESSION MICRODISCECTOMY 1 LEVEL L5-S1;  Surgeon: Meade Maw, MD;  Location: [facility]  ORS;  Service: Neurosurgery;  Laterality: N/A;   TONSILLECTOMY AND ADENOIDECTOMY     TOTAL LAPAROSCOPIC HYSTERECTOMY WITH SALPINGECTOMY Bilateral 06/25/2020   Procedure: TOTAL LAPAROSCOPIC HYSTERECTOMY WITH BILATERAL SALPINGECTOMY;  Surgeon: Homero Fellers, MD;  Location: [facility] ORS;  Service: Gynecology;  Laterality: Bilateral;   URETHRAL  STRICTURE DILATATION     WISDOM TOOTH EXTRACTION      Gynecologic History:  Patient's last menstrual period was 03/07/2020. Last Pap: Results were: 2021 ASCUS HPV neg      Last mammogram: unknown   Obstetric History: [id number]  Family History:  Family History  Problem Relation Age of Onset   Dementia Father    Dementia Paternal Aunt    Cancer Paternal Grandmother    Anxiety disorder Cousin    Depression Cousin     Social History:  Social History   Socioeconomic History   Marital status: Married    Spouse name: John   Number of children: 2   Years of education: 14   Highest education level: Associate degree: occupational, Hotel manager, or vocational program  Occupational History   Occupation: not employed  Tobacco Use   Smoking status: Former    Packs/day: 0.75    Years: 2.00    Pack years: 1.50    Types: Cigarettes    Start date: 07/06/1981    Quit date: 07/07/1983    Years since quitting: 38.1   Smokeless tobacco: Never  Vaping Use   Vaping Use: Never used  Substance and Sexual Activity   Alcohol use: No    Alcohol/week: 0.0 standard drinks   Drug use: No   Sexual activity: Not Currently    Partners: Male  Other Topics Concern   Not on file  Social History Narrative   Married and husband has a lot of medical problems, he is working again    Investment banker, operational of Radio broadcast assistant Strain: Not on file  Food Insecurity: Not on file  Transportation Needs: Not on file  Physical Activity: Not on file  Stress: Not on file  Social Connections: Not on file  Intimate Partner Violence: Not on file    Allergies:  Allergies  Allergen Reactions   Penicillins Anaphylaxis, Hives, Nausea And Vomiting and Swelling    Has patient had a PCN reaction causing immediate rash, facial/tongue/throat swelling, SOB or lightheadedness with hypotension: Yes Has patient had a PCN reaction causing severe rash involving mucus membranes or skin necrosis: Yes Has patient had a  PCN reaction that required hospitalization No Has patient had a PCN reaction occurring within the last 10 years: Yes If all of the above answers are "NO", then may proceed with Cephalosporin use.    Chlorhexidine Gluconate Itching and Rash   Ciprofloxacin Hives   Clindamycin/Lincomycin Hives   Erythromycin Hives and Nausea And Vomiting   Gentamicin Hives   Keflex [Cephalexin] Hives   Lyrica [Pregabalin]     Dizziness, syncope   Nitrofurantoin Monohyd Macro Hives and Nausea And Vomiting   Sulfa Antibiotics Hives and Nausea And Vomiting    Rapid heart rate   Tetracyclines & Related Hives   Adhesive [Tape] Rash   Latex Rash   Vancomycin Itching and Rash    Medications: Prior to Admission medications   Medication Sig Start Date End Date Taking? Authorizing Provider  busPIRone (BUSPAR) 7.5 MG tablet Take 1 tablet (7.5 mg total) by mouth daily. 07/25/21  Yes Ursula Alert, MD  gabapentin (NEURONTIN) 300 MG capsule Take 1-2 capsules (300-600 mg total) by  mouth at bedtime. 07/23/21 01/19/22 Yes Gillis Santa, MD  hydrOXYzine (ATARAX) 25 MG tablet Take 1 tablet (25 mg total) by mouth 2 (two) times daily as needed. For anxiety attacks only 07/25/21  Yes Eappen, Saramma, MD  Insulin Pen Needle 32G X 6 MM MISC 1 each by Does not apply route daily. 02/07/21  Yes Sowles, Drue Stager, MD  levothyroxine (SYNTHROID) 25 MCG tablet Take 1 tablet (25 mcg total) by mouth daily. But take two tablets on Tuesday and Thursday's 07/24/21  Yes Sowles, Drue Stager, MD  Liraglutide -Weight Management (SAXENDA) 18 MG/3ML SOPN Inject 0.6-3 mg into the skin daily. 11/05/20  Yes Sowles, Drue Stager, MD  mirtazapine (REMERON) 15 MG tablet Take 1 tablet (15 mg total) by mouth at bedtime. Dose increase 07/25/21  Yes Eappen, Ria Clock, MD  nystatin (MYCOSTATIN/NYSTOP) powder Apply 1 application topically 3 (three) times daily. 05/15/21  Yes Carrie Mew, MD  omeprazole (PRILOSEC) 40 MG capsule Take 1 capsule (40 mg total) by mouth daily.  07/23/21  Yes Sowles, Drue Stager, MD  prazosin (MINIPRESS) 1 MG capsule Take 1 capsule (1 mg total) by mouth at bedtime. 07/25/21  Yes Ursula Alert, MD  SUMAtriptan (IMITREX) 100 MG tablet Take 1 tablet (100 mg total) by mouth every 2 (two) hours as needed for migraine. May repeat in 2 hours if headache persists or recurs. 11/05/20  Yes Sowles, Drue Stager, MD  topiramate (TOPAMAX) 100 MG tablet Take 1 tablet (100 mg total) by mouth every evening. 11/05/20  Yes Sowles, Drue Stager, MD  venlafaxine XR (EFFEXOR-XR) 150 MG 24 hr capsule Take 1 capsule (150 mg total) by mouth every evening. Take with 37.5 mg mg to equal 187.5 mg daily 07/25/21  Yes Eappen, Ria Clock, MD  venlafaxine XR (EFFEXOR-XR) 37.5 MG 24 hr capsule Take 1 capsule (37.5 mg total) by mouth daily with breakfast. Take along with 150 mg daily 07/25/21  Yes Eappen, Saramma, MD  EPINEPHrine 0.3 mg/0.3 mL IJ SOAJ injection Inject 0.3 mg into the muscle as needed for anaphylaxis. Follow package instructions as needed for severe allergy or anaphylactic reaction. Patient not taking: Reported on 07/31/2021 05/15/21   Carrie Mew, MD    Physical Exam Vitals: Blood pressure 128/70, height 5\' 4"  (1.626 m), weight 218 lb 6.4 oz (99.1 kg), last menstrual period 03/07/2020.  Physical Exam Constitutional:      Appearance: She is well-developed.  Genitourinary:     Genitourinary Comments: External: Normal appearing vulva. No lesions noted.   Bimanual examination: Uterus absent.  No adnexal masses. No adnexal tenderness. Pelvis not fixed.  Breast Exam: breast equal without skin changes, nipple discharge, breast lump or enlarged lymph nodes   HENT:     Head: Normocephalic and atraumatic.  Neck:     Thyroid: No thyromegaly.  Cardiovascular:     Rate and Rhythm: Normal rate and regular rhythm.     Heart sounds: Normal heart sounds.  Pulmonary:     Effort: Pulmonary effort is normal.     Breath sounds: Normal breath sounds.  Abdominal:     General:  Bowel sounds are normal. There is no distension.     Palpations: Abdomen is soft. There is no mass.  Musculoskeletal:     Cervical back: Neck supple.  Neurological:     Mental Status: She is alert and oriented to person, place, and time.  Skin:    General: Skin is warm and dry.  Psychiatric:        Behavior: Behavior normal.  Thought Content: Thought content normal.        Judgment: Judgment normal.  Vitals reviewed.     Female chaperone present for pelvic and breast  portions of the physical exam  Assessment: 56 y.o. [id number] routine annual exam  Plan: Problem List Items Addressed This Visit   None Visit Diagnoses     Encounter for annual routine gynecological examination    -  Primary   Encounter for screening breast examination       Encounter for gynecological examination without abnormal finding           1) Mammogram - recommend yearly screening mammogram.  Mammogram is ordered but patient declines mammograms  2) STI screening was offered and declined.  3)Pap smears- discontinued secondary to hysterectomy.  4) Colonoscopy --performed in 2020.  5) Routine healthcare maintenance including cholesterol, diabetes screening discussed managed by PCP.  6) Osteoporosis screening - Start at [place] MD, Mercedes, [facility] 08/04/2021 2:09 PM

## 2021-08-07 ENCOUNTER — Other Ambulatory Visit: Payer: Self-pay

## 2021-08-07 ENCOUNTER — Encounter: Payer: Self-pay | Admitting: Urology

## 2021-08-07 ENCOUNTER — Ambulatory Visit (INDEPENDENT_AMBULATORY_CARE_PROVIDER_SITE_OTHER): Payer: Commercial Managed Care - PPO | Admitting: Urology

## 2021-08-07 VITALS — BP 118/81 | HR 80 | Ht 64.0 in | Wt 218.0 lb

## 2021-08-07 DIAGNOSIS — R8271 Bacteriuria: Secondary | ICD-10-CM

## 2021-08-07 DIAGNOSIS — N390 Urinary tract infection, site not specified: Secondary | ICD-10-CM

## 2021-08-07 LAB — URINALYSIS, COMPLETE
Bilirubin, UA: NEGATIVE
Glucose, UA: NEGATIVE
Nitrite, UA: NEGATIVE
Specific Gravity, UA: 1.02 (ref 1.005–1.030)
Urobilinogen, Ur: 1 mg/dL (ref 0.2–1.0)
pH, UA: 6 (ref 5.0–7.5)

## 2021-08-07 LAB — MICROSCOPIC EXAMINATION: WBC, UA: >30 /hpf — AB (ref 0–5)

## 2021-08-07 MED ORDER — ESTRADIOL 0.1 MG/GM VA CREA: TOPICAL_CREAM | VAGINAL | 1 refills | Status: DC

## 2021-08-07 NOTE — Patient Instructions (Signed)
Take cranberry tablets 1-2 times daily, as well as use topical estrogen cream.  These can help prevent infections and help with burning and discomfort as well as urgency and frequency.  The estrogen cream can take a few weeks to work.  Use it nightly for the first 2 weeks, then spaced to 3 times per week.  You do not need to use an applicator, just apply a pea-sized amount around the opening of the to the EP 3.  Asymptomatic Bacteriuria Asymptomatic bacteriuria is the presence of a large number of bacteria in the urine without the usual symptoms of burning or frequent urination. This is usually not harmful, and treatment may not be needed. A person with this condition will not be more likely to develop an infection in the future. What are the causes? This condition is caused by an increase in bacteria in the urine. This increase can be caused by: Bacteria entering the urinary tract, such as during sex. A blockage in the urinary tract, such as from kidney stones or a tumor. Bladder problems that prevent the bladder from emptying. What increases the risk? You are more likely to develop this condition if: You have diabetes. You are an older adult. This especially affects older adults in long-term care facilities. You are pregnant and in the first trimester. You have kidney stones. You are female. You have had a kidney transplant. You have a leaky kidney tube valve (reflux). You had a urinary catheter for a long period of time. This is a long, thin tube that collects urine. What are the signs or symptoms? There are no symptoms of this condition. How is this diagnosed? This condition is diagnosed with a urine test. Because this condition does not cause symptoms, it is usually diagnosed when a urine sample is taken to treat or diagnose another condition, such as pregnancy or kidney problems. Most women who are in their first trimester of pregnancy are screened for asymptomatic bacteriuria. How is  this treated? Usually, treatment is not needed for this condition. Treating the condition can lead to other problems, such as a yeast infection or the growth of bacteria that do not respond to treatment (antibiotic-resistant bacteria). Some people do need treatment with antibiotic medicines to prevent kidney infection, known as pyelonephritis. Treatment is needed if: You are pregnant. In pregnant women, kidney infection can lead to: Early labor (premature labor). Very low birth weight (fetal growth restriction). Newborn death. You are having a procedure that affects the urinary tract. You have had a kidney transplant. If you are diagnosed with this condition, talk with your health care provider about any concerns that you have. Follow these instructions at home: Medicines Take over-the-counter and prescription medicines only as told by your health care provider. If you were prescribed an antibiotic medicine, take it as told by your health care provider. Do not stop using the antibiotic even if you start to feel better. General instructions Monitor your condition for any changes. Drink enough fluid to keep your urine pale yellow. Urinate more often to keep your bladder empty. If you are female, keep the area around your vagina and rectum clean. Wipe from front to back after urinating or having a bowel movement. Use each piece of toilet paper only once. Keep all follow-up visits. This is important. Contact a health care provider if: You have symptoms of a urine infection, such as: A burning sensation, or pain when you urinate. A strong need to urinate, or urinating more often. Urine turning  discolored or cloudy. Blood in your urine. Urine that smells bad. Get help right away if: You develop signs of a kidney infection, such as: Back pain or pelvic pain. A fever or chills. Nausea or vomiting. Severe pain that cannot be controlled with medicine. Summary Asymptomatic bacteriuria is the  presence of a large number of bacteria in the urine without the usual symptoms of burning or frequent urination. Usually, treatment is not needed for this condition. Treating the condition can lead to other problems, such as a yeast infection or the growth of bacteria that do not respond to treatment. Some people do need treatment. Treatment is needed if you are pregnant, if you are having a procedure that affects the urinary tract, or if you have had a kidney transplant. If you were prescribed an antibiotic medicine, take it as told by your health care provider. Do not stop using the antibiotic even if you start to feel better. This information is not intended to replace advice given to you by your health care provider. Make sure you discuss any questions you have with your health care provider. Document Revised: 02/02/2020 Document Reviewed: 02/02/2020 Elsevier Patient Education  [place].

## 2021-08-07 NOTE — Progress Notes (Signed)
08/07/21 3:00 PM   Kelly [name] 1966/03/18 [id number]  CC: Recurrent UTI/urinary colonization  HPI: 56 year old female with multiple E. coli urine cultures, unclear if these represent true UTI versus urinary colonization.  She was recently seen by infectious disease Dr. Steva Ready on 07/31/2021, and this was felt to be more related to chronic colonization.  I reviewed those outside notes.  She has a number of allergies which make antibiotic treatment very challenging.  She has chronic urinary urgency/frequency with some mild dysuria and discomfort.  She does not report significant improvement in her urinary symptoms after taking antibiotics.  She reports a few episodes of gross hematuria associated with flank pain that she thinks are related to passing stones.  Urinalysis today greater than 30 WBCs, 3-10 RBCs, many bacteria, no yeast, nitrite negative, 1+ leukocytes.  Will send for culture and atypicals.  CT in November 2021 was benign with no hydronephrosis or stones.  PMH: Past Medical History:  Diagnosis Date   Allergy    Anxiety    Depression    GERD (gastroesophageal reflux disease)    History of kidney stones    Insomnia    Intermittent low back pain    Migraines    Osteoarthritis    Recurrent UTI    Restless leg syndrome    Sciatica of right side    left side   Symptomatic menopausal or female climacteric states    Vertigo    Vitamin D deficiency     Surgical History: Past Surgical History:  Procedure Laterality Date   ABDOMINAL HYSTERECTOMY     BACK SURGERY     COLONOSCOPY WITH PROPOFOL N/A 02/03/2019   Procedure: COLONOSCOPY WITH PROPOFOL;  Surgeon: Jonathon Bellows, MD;  Location: [facility] ENDOSCOPY;  Service: Gastroenterology;  Laterality: N/A;   COLONOSCOPY WITH PROPOFOL N/A 03/10/2019   Procedure: COLONOSCOPY WITH PROPOFOL;  Surgeon: Jonathon Bellows, MD;  Location: [facility] ENDOSCOPY;  Service: Gastroenterology;  Laterality: N/A;   CYSTOSCOPY N/A 06/25/2020    Procedure: CYSTOSCOPY;  Surgeon: Homero Fellers, MD;  Location: [facility] ORS;  Service: Gynecology;  Laterality: N/A;   DIAGNOSTIC LAPAROSCOPY     DILATION AND CURETTAGE OF UTERUS     HYSTEROSCOPY WITH D & C N/A 05/29/2020   Procedure: DILATATION AND CURETTAGE /HYSTEROSCOPY;  Surgeon: Homero Fellers, MD;  Location: [facility] ORS;  Service: Gynecology;  Laterality: N/A;   KNEE ARTHROSCOPY WITH MEDIAL MENISECTOMY Left 01/14/2017   Procedure: KNEE ARTHROSCOPY WITH MEDIAL AND LATERAL MENISECTOMY;  Surgeon: Hessie Knows, MD;  Location: [facility] ORS;  Service: Orthopedics;  Laterality: Left;  Partial Knee menisectomy   KNEE SURGERY Left 05/2011   Dr. Rudene Christians- Arthroscopic   LUMBAR LAMINECTOMY/DECOMPRESSION MICRODISCECTOMY N/A 09/16/2016   Procedure: LUMBAR LAMINECTOMY/DECOMPRESSION MICRODISCECTOMY 1 LEVEL L5-S1;  Surgeon: Meade Maw, MD;  Location: [facility] ORS;  Service: Neurosurgery;  Laterality: N/A;   TONSILLECTOMY AND ADENOIDECTOMY     TOTAL LAPAROSCOPIC HYSTERECTOMY WITH SALPINGECTOMY Bilateral 06/25/2020   Procedure: TOTAL LAPAROSCOPIC HYSTERECTOMY WITH BILATERAL SALPINGECTOMY;  Surgeon: Homero Fellers, MD;  Location: [facility] ORS;  Service: Gynecology;  Laterality: Bilateral;   URETHRAL STRICTURE DILATATION     WISDOM TOOTH EXTRACTION       Family History: Family History  Problem Relation Age of Onset   Dementia Father    Dementia Paternal Aunt    Cancer Paternal Grandmother    Anxiety disorder Cousin    Depression Cousin     Social History:  reports that she quit smoking about 38 years ago. Her  smoking use included cigarettes. She started smoking about 40 years ago. She has a 1.50 pack-year smoking history. She has never used smokeless tobacco. She reports that she does not drink alcohol and does not use drugs.  Physical Exam: LMP 03/07/2020    Constitutional:  Alert and oriented, No acute distress. Cardiovascular: No clubbing, cyanosis, or edema. Respiratory: Normal  respiratory effort, no increased work of breathing. GI: Abdomen is soft, nontender, nondistended, no abdominal masses   Pertinent Imaging: I have personally viewed and interpreted the CT abdomen pelvis with contrast from November 2021 showing no hydronephrosis or stones, benign parapelvic cysts present.  Assessment & Plan:   56 year old female with recurrent E. coli urine cultures, challenging to elucidate if these are chronic colonization and asymptomatic bacteriuria versus true UTI.  She has a number of antibiotic allergies that make treatment extremely challenging.  I think her symptoms are more consistent with genitourinary syndrome of menopause, and we reviewed this at length.  I recommended cranberry tablets 1-2 times daily for UTI prevention, as well as starting a topical estrogen cream.  Risk and benefits discussed at length.  We will follow-up urine culture as well in case she develops fever or severe urinary symptoms that would warrant antibiotic treatment.  Topical estrogen cream, cranberry tablets daily for GSM RTC 4 to 5 months symptom check  Nickolas Madrid, MD 08/07/2021  [facility] [address] [place], [place] [address] [phone]

## 2021-08-10 ENCOUNTER — Other Ambulatory Visit: Payer: Self-pay | Admitting: Psychiatry

## 2021-08-10 DIAGNOSIS — F411 Generalized anxiety disorder: Secondary | ICD-10-CM

## 2021-08-10 DIAGNOSIS — F3342 Major depressive disorder, recurrent, in full remission: Secondary | ICD-10-CM

## 2021-08-10 DIAGNOSIS — F410 Panic disorder [episodic paroxysmal anxiety] without agoraphobia: Secondary | ICD-10-CM

## 2021-08-10 DIAGNOSIS — G4701 Insomnia due to medical condition: Secondary | ICD-10-CM

## 2021-08-13 ENCOUNTER — Encounter: Payer: Self-pay | Admitting: Obstetrics and Gynecology

## 2021-08-26 ENCOUNTER — Encounter: Payer: Self-pay | Admitting: Student in an Organized Health Care Education/Training Program

## 2021-08-28 ENCOUNTER — Encounter: Payer: Self-pay | Admitting: Student in an Organized Health Care Education/Training Program

## 2021-08-28 ENCOUNTER — Other Ambulatory Visit: Payer: Self-pay

## 2021-08-28 ENCOUNTER — Ambulatory Visit
Payer: Commercial Managed Care - PPO | Attending: Student in an Organized Health Care Education/Training Program | Admitting: Student in an Organized Health Care Education/Training Program

## 2021-08-28 DIAGNOSIS — G894 Chronic pain syndrome: Secondary | ICD-10-CM

## 2021-08-28 DIAGNOSIS — M4317 Spondylolisthesis, lumbosacral region: Secondary | ICD-10-CM | POA: Diagnosis not present

## 2021-08-28 DIAGNOSIS — M47816 Spondylosis without myelopathy or radiculopathy, lumbar region: Secondary | ICD-10-CM | POA: Diagnosis not present

## 2021-08-28 NOTE — Progress Notes (Signed)
Patient: Kelly [name]  Service Category: E/M  Provider: Gillis Santa, MD  DOB: 1966/06/08  DOS: 08/28/2021  Location: Office  MRN: [id number]  Setting: Ambulatory outpatient  Referring Provider: Steele Sizer, MD  Type: Established Patient  Specialty: Interventional Pain Management  PCP: Steele Sizer, MD  Location: Remote location  Delivery: TeleHealth     Virtual Encounter - Pain Management PROVIDER NOTE: Information contained herein reflects review and annotations entered in association with encounter. Interpretation of such information and data should be left to medically-trained personnel. Information provided to patient can be located elsewhere in the medical record under "Patient Instructions". Document created using STT-dictation technology, any transcriptional errors that may result from process are unintentional.    Contact & Pharmacy Preferred: [phone] Home: [phone] (home) Mobile: [phone] (mobile) E-mail: [email or web address]  [facility], [place] [address] Phone: [phone] Fax: [phone]  [facility] [id number], [place] [address] Phone: [phone] Fax: [phone]   Pre-screening  Kelly [name] offered "in-person" vs "virtual" encounter. She indicated preferring virtual for this encounter.   Reason COVID-19*   Social distancing based on CDC and AMA recommendations.   I contacted Kelly [name] on 08/28/2021 via telephone.      I clearly identified myself as Gillis Santa, MD. I verified that I was speaking with the correct person using two identifiers (Name: Kelly KOLLE, and date of birth: [birth year]).  Consent I sought verbal advanced consent from Kelly [name] for virtual visit interactions. I informed Kelly [name] of possible security and privacy concerns, risks, and limitations  associated with providing "not-in-person" medical evaluation and management services. I also informed Kelly [name] of the availability of "in-person" appointments. Finally, I informed her that there would be a charge for the virtual visit and that she could be  personally, fully or partially, financially responsible for it. Kelly [name] expressed understanding and agreed to proceed.   Historic Elements   Kelly [name] is a 56 y.o. year old, female patient evaluated today after our last contact on 07/23/2021. Kelly [name]  has a past medical history of Allergy, Anxiety, Depression, Family history of ovarian cancer, GERD (gastroesophageal reflux disease), History of kidney stones, Insomnia, Intermittent low back pain, Migraines, Osteoarthritis, Recurrent UTI, Restless leg syndrome, Sciatica of right side, Symptomatic menopausal or female climacteric states, Vertigo, and Vitamin D deficiency. She also  has a past surgical history that includes Tonsillectomy and adenoidectomy; Urethra dilation; Knee surgery (Left, 05/2011); Lumbar laminectomy/decompression microdiscectomy (N/A, 09/16/2016); Back surgery; Diagnostic laparoscopy; Dilation and curettage of uterus; Knee arthroscopy with medial menisectomy (Left, 01/14/2017); Colonoscopy with propofol (N/A, 02/03/2019); Colonoscopy with propofol (N/A, 03/10/2019); Hysteroscopy with D & C (N/A, 05/29/2020); Wisdom tooth extraction; Total laparoscopic hysterectomy with salpingectomy (Bilateral, 06/25/2020); Cystoscopy (N/A, 06/25/2020); and Abdominal hysterectomy. Kelly [name] has a current medication list which includes the following prescription(s): buspirone, estradiol, gabapentin, hydroxyzine, insulin pen needle, levothyroxine, saxenda, mirtazapine, nystatin, omeprazole, prazosin, sumatriptan, topiramate, venlafaxine xr, and venlafaxine xr. She  reports that she quit smoking about 38 years ago. Her smoking use included cigarettes. She started smoking about 40  years ago. She has a 1.50 pack-year smoking history. She has never used smokeless tobacco. She reports that she does not drink alcohol and does not use drugs. Kelly [name] is allergic to penicillins, chlorhexidine gluconate, ciprofloxacin, clindamycin/lincomycin, erythromycin, gentamicin, keflex [cephalexin], lyrica [pregabalin], nitrofurantoin  monohyd macro, sulfa antibiotics, tetracyclines & related, adhesive [tape], latex, and vancomycin.   HPI  Today, she is being contacted for a post-procedure assessment.   Post-procedure evaluation   Type: Therapeutic Medial Branch Facet Block with steroid #5 (#4 done 05/15/20) Region: Lumbar Level: L3/4, L4/5, L5/S1 Laterality: Bilateral  Effectiveness:  Initial hour after procedure: 100 %  Subsequent 4-6 hours post-procedure: 90 %  Analgesia past initial 6 hours: 85 % (85% on right, 25% on left)  Ongoing improvement:  Analgesic: 85% on the right, 25% on the left Function: Somewhat improved although her left lower back is in pretty severe pain after her fall ROM: Somewhat improved although her left lower back is in pretty severe pain after her fall   Patient sustained fall on Monday, fell on one of dog's toys and is now having increased left sided pain  and feel that the pain relief that she received is now gone after her fall. Increased pain on the left with facet loading and lumbar extension.  Patient is requesting repeat left-sided medial branch nerve block.   Laboratory Chemistry Profile   Renal Lab Results  Component Value Date   BUN 13 05/15/2021   CREATININE 0.83 09/62/8366   BCR NOT APPLICABLE 29/47/6546   GFRAA 69 12/26/2020   GFRNONAA >60 05/15/2021    Hepatic Lab Results  Component Value Date   AST 19 12/26/2020   ALT 21 12/26/2020   ALBUMIN 4.1 05/22/2020   ALKPHOS 63 05/22/2020   LIPASE 23 05/22/2020    Electrolytes Lab Results  Component Value Date   NA 139 05/15/2021   K 4.0 05/15/2021   CL 111 05/15/2021    CALCIUM 8.7 (L) 05/15/2021    Bone Lab Results  Component Value Date   VD25OH 37 03/31/2019    Inflammation (CRP: Acute Phase) (ESR: Chronic Phase) No results found for: CRP, ESRSEDRATE, LATICACIDVEN       Note: Above Lab results reviewed.  Assessment  The primary encounter diagnosis was Lumbar spondylosis. Diagnoses of Spondylolisthesis of lumbosacral region and Chronic pain syndrome were also pertinent to this visit.  Plan of Care   Orders:  Orders Placed This Encounter  Procedures   LUMBAR FACET(MEDIAL BRANCH NERVE BLOCK) MBNB    Standing Status:   Future    Standing Expiration Date:   09/25/2021    Scheduling Instructions:     Procedure: Lumbar facet block (AKA.: Lumbosacral medial branch nerve block)     Side: LEFT     Level: L3-4, L4-5,Facets (L3, L4, L5, Medial Branch Nerves)     Sedation: left     Timeframe: ASAA    Order Specific Question:   Where will this procedure be performed?    Answer:   [facility]   Follow-up plan:   Return in about 6 days (around 09/03/2021) for Left L3, 4, 5 MBNB, in clinic NS.    Recent Visits Date Type Provider Dept  07/23/21 Procedure visit Gillis Santa, MD [facility]  06/26/21 Office Visit Gillis Santa, MD [facility]  Showing recent visits within past 90 days and meeting all other requirements Today's Visits Date Type Provider Dept  08/28/21 Office Visit Gillis Santa, MD [facility]  Showing today's visits and meeting all other requirements Future Appointments No visits were found meeting these conditions. Showing future appointments within next 90 days and meeting all other requirements  I discussed the assessment and treatment plan with the patient. The patient was provided an opportunity to  ask questions and all were answered. The patient agreed with the plan and demonstrated an understanding of the instructions.  Patient advised to call back or seek an in-person evaluation if the  symptoms or condition worsens.  Duration of encounter: 20mnutes.  Note by: BGillis Santa MD Date: 08/28/2021; Time: 10:59 AM

## 2021-08-29 ENCOUNTER — Other Ambulatory Visit: Payer: Self-pay

## 2021-08-29 DIAGNOSIS — N390 Urinary tract infection, site not specified: Secondary | ICD-10-CM

## 2021-08-29 DIAGNOSIS — N952 Postmenopausal atrophic vaginitis: Secondary | ICD-10-CM

## 2021-08-29 MED ORDER — ESTRADIOL 0.1 MG/GM VA CREA: TOPICAL_CREAM | VAGINAL | 3 refills | Status: AC

## 2021-08-29 NOTE — Patient Instructions (Signed)

## 2021-09-03 ENCOUNTER — Telehealth: Payer: Self-pay

## 2021-09-03 ENCOUNTER — Other Ambulatory Visit: Payer: Self-pay

## 2021-09-03 ENCOUNTER — Other Ambulatory Visit: Payer: Self-pay | Admitting: Family Medicine

## 2021-09-03 MED ORDER — SAXENDA 18 MG/3ML ~~LOC~~ SOPN: 0.6000 mg | PEN_INJECTOR | Freq: Every day | SUBCUTANEOUS | 1 refills | Status: DC

## 2021-09-03 MED ORDER — SAXENDA 18 MG/3ML ~~LOC~~ SOPN: 0.6000 mg | PEN_INJECTOR | Freq: Every day | SUBCUTANEOUS | 1 refills | Status: AC

## 2021-09-03 NOTE — Telephone Encounter (Signed)
Pt is asking for refill on Saxenda to [facility] on [address]. [facility] is primary

## 2021-09-04 LAB — TSH: TSH: 2.23 mIU/L (ref 0.40–4.50)

## 2021-09-17 ENCOUNTER — Encounter: Payer: Self-pay | Admitting: Student in an Organized Health Care Education/Training Program

## 2021-09-17 ENCOUNTER — Ambulatory Visit (HOSPITAL_BASED_OUTPATIENT_CLINIC_OR_DEPARTMENT_OTHER): Payer: Commercial Managed Care - PPO | Admitting: Student in an Organized Health Care Education/Training Program

## 2021-09-17 ENCOUNTER — Ambulatory Visit
Admission: RE | Admit: 2021-09-17 | Discharge: 2021-09-17 | Disposition: A | Discharge status: Home / Self Care | Payer: Commercial Managed Care - PPO | Source: Ambulatory Visit | Attending: Student in an Organized Health Care Education/Training Program | Admitting: Student in an Organized Health Care Education/Training Program

## 2021-09-17 ENCOUNTER — Other Ambulatory Visit: Payer: Self-pay

## 2021-09-17 VITALS — BP 115/90 | HR 93 | Temp 97.3°F | Resp 14 | Ht 64.0 in | Wt 220.0 lb

## 2021-09-17 DIAGNOSIS — M4317 Spondylolisthesis, lumbosacral region: Secondary | ICD-10-CM | POA: Diagnosis not present

## 2021-09-17 DIAGNOSIS — M47816 Spondylosis without myelopathy or radiculopathy, lumbar region: Secondary | ICD-10-CM

## 2021-09-17 DIAGNOSIS — M549 Dorsalgia, unspecified: Secondary | ICD-10-CM | POA: Diagnosis present

## 2021-09-17 DIAGNOSIS — M542 Cervicalgia: Secondary | ICD-10-CM

## 2021-09-17 DIAGNOSIS — G894 Chronic pain syndrome: Secondary | ICD-10-CM | POA: Insufficient documentation

## 2021-09-17 DIAGNOSIS — M47812 Spondylosis without myelopathy or radiculopathy, cervical region: Secondary | ICD-10-CM | POA: Insufficient documentation

## 2021-09-17 MED ORDER — ROPIVACAINE HCL 2 MG/ML IJ SOLN
9.0000 mL | Freq: Once | INTRAMUSCULAR | Status: AC
Start: 2021-09-17 — End: 2021-09-17
  Administered 2021-09-17: 9 mL via PERINEURAL

## 2021-09-17 MED ORDER — DEXAMETHASONE SODIUM PHOSPHATE 10 MG/ML IJ SOLN
10.0000 mg | Freq: Once | INTRAMUSCULAR | Status: AC
  Administered 2021-09-17: 10 mg

## 2021-09-17 MED ORDER — LIDOCAINE HCL 2 % IJ SOLN
20.0000 mL | Freq: Once | INTRAMUSCULAR | Status: AC
  Administered 2021-09-17: 400 mg

## 2021-09-17 MED ORDER — DEXAMETHASONE SODIUM PHOSPHATE 10 MG/ML IJ SOLN
INTRAMUSCULAR | Status: AC
  Filled 2021-09-17: qty 1

## 2021-09-17 MED ORDER — ROPIVACAINE HCL 2 MG/ML IJ SOLN
9.0000 mL | Freq: Once | INTRAMUSCULAR | Status: AC
  Administered 2021-09-17: 9 mL via PERINEURAL

## 2021-09-17 MED ORDER — ROPIVACAINE HCL 2 MG/ML IJ SOLN
INTRAMUSCULAR | Status: AC
  Filled 2021-09-17: qty 20

## 2021-09-17 MED ORDER — LIDOCAINE HCL 2 % IJ SOLN
INTRAMUSCULAR | Status: AC
  Filled 2021-09-17: qty 20

## 2021-09-17 NOTE — Progress Notes (Signed)
Safety precautions to be maintained throughout the outpatient stay will include: orient to surroundings, keep bed in low position, maintain call bell within reach at all times, provide assistance with transfer out of bed and ambulation.  

## 2021-09-17 NOTE — Patient Instructions (Signed)

## 2021-09-17 NOTE — Progress Notes (Signed)
Patient's Name: Kelly [name]  MRN: [id number]  ?Referring Provider: Gillis Santa, MD  DOB: 1965/11/01  ?PCP: Steele Sizer, MD  DOS: 09/17/2021  ?Note by: Gillis Santa, MD  Service setting: Ambulatory outpatient  ?Specialty: Interventional Pain Management  Patient type: Established  ?Location: [facility] (AM[facility]  Visit type: Interventional Procedure  ? ?Primary Reason for Visit: Interventional Pain Management Treatment. ?CC: Back Pain ? ?Procedure:  Anesthesia, Analgesia, Anxiolysis:  ?Type: Therapeutic Medial Branch Facet Block with steroid #6 (#5 done 07/23/21 ?Region: Lumbar ?Level: L3/4, L4/5, L5/S1 ?Laterality: Bilateral  Type: Local Anesthesia  ?Local Anesthetic: Lidocaine 1% ?Indication(s): Analgesia  ? ?Indications: ?1. Lumbar spondylosis   ?2. Spondylolisthesis of lumbosacral region   ?3. Chronic pain syndrome   ?4. Cervicalgia   ?5. Cervical facet joint syndrome   ? ?Pain Score: ?Pre-procedure: 10-Worst pain ever/10 ?Post-procedure: 0-No pain/10 ? ?Pre-op Assessment:  ?Kelly [name] is a 56 y.o. (year old), female patient, seen today for interventional treatment. She  has a past surgical history that includes Tonsillectomy and adenoidectomy; Urethra dilation; Knee surgery (Left, 05/2011); Lumbar laminectomy/decompression microdiscectomy (N/A, 09/16/2016); Back surgery; Diagnostic laparoscopy; Dilation and curettage of uterus; Knee arthroscopy with medial menisectomy (Left, 01/14/2017); Colonoscopy with propofol (N/A, 02/03/2019); Colonoscopy with propofol (N/A, 03/10/2019); Hysteroscopy with D & C (N/A, 05/29/2020); Wisdom tooth extraction; Total laparoscopic hysterectomy with salpingectomy (Bilateral, 06/25/2020); Cystoscopy (N/A, 06/25/2020); and Abdominal hysterectomy. Kelly [name] has a current medication list which includes the following prescription(s): buspirone, estradiol, gabapentin, hydroxyzine, insulin pen needle, levothyroxine, saxenda, mirtazapine, nystatin, omeprazole,  prazosin, sumatriptan, topiramate, venlafaxine xr, and venlafaxine xr. Her primarily concern today is the Back Pain ? ?Initial Vital Signs: There were no vitals taken for this visit. ?BMI: Estimated body mass index is 37.76 kg/m? as calculated from the following: ?  Height as of this encounter: '5\' 4"'$  (1.626 m). ?  Weight as of this encounter: 220 lb (99.8 kg). ? ?Risk Assessment: ?Allergies: Reviewed. She is allergic to penicillins, chlorhexidine gluconate, ciprofloxacin, clindamycin/lincomycin, erythromycin, gentamicin, keflex [cephalexin], lyrica [pregabalin], nitrofurantoin monohyd macro, sulfa antibiotics, tetracyclines & related, adhesive [tape], latex, and vancomycin.  ?Allergy Precautions: None required ?Coagulopathies: Reviewed. None identified.  ?Blood-thinner therapy: None at this time ?Active Infection(s): Reviewed. None identified. Kelly [name] is afebrile ? ?Site Confirmation: Kelly [name] was asked to confirm the procedure and laterality before marking the site ?Procedure checklist: Completed ?Consent: Before the procedure and under the influence of no sedative(s), amnesic(s), or anxiolytics, the patient was informed of the treatment options, risks and possible complications. To fulfill our ethical and legal obligations, as recommended by the American Medical Association's Code of Ethics, I have informed the patient of my clinical impression; the nature and purpose of the treatment or procedure; the risks, benefits, and possible complications of the intervention; the alternatives, including doing nothing; the risk(s) and benefit(s) of the alternative treatment(s) or procedure(s); and the risk(s) and benefit(s) of doing nothing. ?The patient was provided information about the general risks and possible complications associated with the procedure. These may include, but are not limited to: failure to achieve desired goals, infection, bleeding, organ or nerve damage, allergic reactions, paralysis, and  death. ?In addition, the patient was informed of those risks and complications associated to Spine-related procedures, such as failure to decrease pain; infection (i.e.: Meningitis, epidural or intraspinal abscess); bleeding (i.e.: epidural hematoma, subarachnoid hemorrhage, or any other type of intraspinal or peri-dural bleeding); organ or nerve damage (i.e.: Any type of peripheral nerve, nerve root, or spinal cord injury)  with subsequent damage to sensory, motor, and/or autonomic systems, resulting in permanent pain, numbness, and/or weakness of one or several areas of the body; allergic reactions; (i.e.: anaphylactic reaction); and/or death. ?Furthermore, the patient was informed of those risks and complications associated with the medications. These include, but are not limited to: allergic reactions (i.e.: anaphylactic or anaphylactoid reaction(s)); adrenal axis suppression; blood sugar elevation that in diabetics may result in ketoacidosis or comma; water retention that in patients with history of congestive heart failure may result in shortness of breath, pulmonary edema, and decompensation with resultant heart failure; weight gain; swelling or edema; medication-induced neural toxicity; particulate matter embolism and blood vessel occlusion with resultant organ, and/or nervous system infarction; and/or aseptic necrosis of one or more joints. ?Finally, the patient was informed that Medicine is not an exact science; therefore, there is also the possibility of unforeseen or unpredictable risks and/or possible complications that may result in a catastrophic outcome. The patient indicated having understood very clearly. We have given the patient no guarantees and we have made no promises. Enough time was given to the patient to ask questions, all of which were answered to the patient's satisfaction. Kelly [name] has indicated that she wanted to continue with the procedure. ?Attestation: I, the ordering provider,  attest that I have discussed with the patient the benefits, risks, side-effects, alternatives, likelihood of achieving goals, and potential problems during recovery for the procedure that I have provided informed consent. ?Date: 09/17/2021; Time: 11:02 AM ? ?Pre-Procedure Preparation:  ?Monitoring: As per clinic protocol. Respiration, ETCO2, SpO2, BP, heart rate and rhythm monitor placed and checked for adequate function ?Safety Precautions: Patient was assessed for positional comfort and pressure points before starting the procedure. ?Time-out: I initiated and conducted the "Time-out" before starting the procedure, as per protocol. The patient was asked to participate by confirming the accuracy of the "Time Out" information. Verification of the correct person, site, and procedure were performed and confirmed by me, the nursing staff, and the patient. "Time-out" conducted as per Joint Commission's Universal Protocol (UP.01.01.01). ?"Time-out" Date & Time: 09/17/2021; 1034 hrs. ? ?Description of Procedure Process:   ?Position: Prone ?Target Area: For Lumbar Facet blocks, the target is the groove formed by the junction of the transverse process and superior articular process. ?Approach: Paramedial approach. ?Area Prepped: Entire Posterior Lumbosacral Region ?Prepping solution: ChloraPrep (2% chlorhexidine gluconate and 70% isopropyl alcohol) ?Safety Precautions: Aspiration looking for blood return was conducted prior to all injections. At no point did we inject any substances, as a needle was being advanced. No attempts were made at seeking any paresthesias. Safe injection practices and needle disposal techniques used. Medications properly checked for expiration dates. SDV (single dose vial) medications used. ?Description of the Procedure: Protocol guidelines were followed. The patient was placed in position over the fluoroscopy table. The target area was identified and the area prepped in the usual manner. Skin  desensitized using vapocoolant spray. Skin & deeper tissues infiltrated with local anesthetic. Appropriate amount of time allowed to pass for local anesthetics to take effect. The procedure needle was introduced t

## 2021-09-18 ENCOUNTER — Telehealth: Payer: Self-pay | Admitting: *Deleted

## 2021-09-18 NOTE — Telephone Encounter (Signed)
Called for post procedure check. Denies any problems. ?

## 2021-10-09 ENCOUNTER — Encounter: Payer: Self-pay | Admitting: Student in an Organized Health Care Education/Training Program

## 2021-10-09 ENCOUNTER — Ambulatory Visit
Payer: Commercial Managed Care - PPO | Attending: Student in an Organized Health Care Education/Training Program | Admitting: Student in an Organized Health Care Education/Training Program

## 2021-10-09 DIAGNOSIS — M542 Cervicalgia: Secondary | ICD-10-CM

## 2021-10-09 DIAGNOSIS — M47816 Spondylosis without myelopathy or radiculopathy, lumbar region: Secondary | ICD-10-CM

## 2021-10-09 DIAGNOSIS — G894 Chronic pain syndrome: Secondary | ICD-10-CM | POA: Diagnosis not present

## 2021-10-09 DIAGNOSIS — M47812 Spondylosis without myelopathy or radiculopathy, cervical region: Secondary | ICD-10-CM

## 2021-10-09 DIAGNOSIS — M4317 Spondylolisthesis, lumbosacral region: Secondary | ICD-10-CM

## 2021-10-09 NOTE — Assessment & Plan Note (Signed)
Status post positive diagnostic lumbar facet medial branch nerve blocks that provided her with at least 70% pain relief for 5 days.  We discussed lumbar radiofrequency ablation for the purpose of obtaining longer-term pain relief.  Risk and benefits reviewed.  We will start with the left side first followed 2 weeks later by the right side.  We will plan on doing this with sedation. ?

## 2021-10-09 NOTE — Assessment & Plan Note (Signed)
Obtain cervical spine x-ray given cervicalgia and neck pain with occasional radiation into bilateral forearms and hands.  Patient is also been having headaches.  Her x-rays show Moderate loss of disc height at C5-C6. Mild loss of disc height at C6-C7. Mild endplate spurring noted at C5-C6. ? Uncovertebral spurring with at least mild neural foraminal narrowing on the left at C3-C4. Minor neural foraminal narrowing from uncovertebral spurring bilaterally at C5-C6. Facet degenerative changes most evident on the left at C3-C4.  She has been doing home physical therapy exercises for her neck.  Recommend cervical MRI for further work-up after which we will discuss treatment plan which may include cervical epidural steroid injection, cervical facet medial branch nerve block and possible RFA. ?

## 2021-10-09 NOTE — Progress Notes (Signed)
Patient: Kelly [name]  Service Category: E/M  Provider: Gillis Santa, MD  ?DOB: 1966/03/13  DOS: 10/09/2021  Location: Office  ?MRN: [id number]  Setting: Ambulatory outpatient  Referring Provider: Steele Sizer, MD  ?Type: Established Patient  Specialty: Interventional Pain Management  PCP: Steele Sizer, MD  ?Location: Remote location  Delivery: TeleHealth    ? ?Virtual Encounter - Pain Management ?PROVIDER NOTE: Information contained herein reflects review and annotations entered in association with encounter. Interpretation of such information and data should be left to medically-trained personnel. Information provided to patient can be located elsewhere in the medical record under "Patient Instructions". Document created using STT-dictation technology, any transcriptional errors that may result from process are unintentional.  ?  ?Contact & Pharmacy ?Preferred: [phone] ?Home: [phone] (home) ?Mobile: [phone] (mobile) ?E-mail: [email or web address]  ?[id number] [place]?[id number]?Phone: [phone]: [phone]?  ?Pre-screening  ?Ms. KJefm Bryantoffered "in-person" vs "virtual" encounter. She indicated preferring virtual for this encounter.  ? ?Reason ?COVID-19*  Social distancing based on CDC and AMA recommendations.  ? ?I contacted LElie Conferon 10/09/2021 via telephone.      I clearly identified myself as BGillis Santa MD. I verified that I was speaking with the correct person using two identifiers (Name: LHALAYNA BLANE and date of birth: 931-Jul-1967. ? ?Consent ?I sought verbal advanced consent from LElie Conferfor virtual visit interactions. I informed Ms. KCalefof possible security and privacy concerns, risks, and limitations associated with providing "not-in-person" medical evaluation and management services. I also informed Ms. KAnastasof the availability of "in-person" appointments. Finally, I  informed her that there would be a charge for the virtual visit and that she could be  personally, fully or partially, financially responsible for it. Ms. KMccullyexpressed understanding and agreed to proceed.  ? ?Historic Elements   ?Ms. Kelly BREAUis a [age]y.o. year old, female patient evaluated today after our last contact on 09/17/2021. Kelly [name] has a past medical history of Allergy, Anxiety, Depression, Family history of ovarian cancer, GERD (gastroesophageal reflux disease), History of kidney stones, Insomnia, Intermittent low back pain, Migraines, Osteoarthritis, Recurrent UTI, Restless leg syndrome, Sciatica of right side, Symptomatic menopausal or female climacteric states, Vertigo, and Vitamin D deficiency. She also  has a past surgical history that includes Tonsillectomy and adenoidectomy; Urethra dilation; Knee surgery (Left, 05/2011); Lumbar laminectomy/decompression microdiscectomy (N/A, 09/16/2016); Back surgery; Diagnostic laparoscopy; Dilation and curett56y.o.  uterus; Knee arthroscopy with medial menisectomy (Left, 01/14/2017); Colonoscopy with propofol (N/A, 02/03/2019); Colonoscopy with propofol (N/A, 03/10/2019); Hysteroscopy with D & C (N/A, 05/29/2020); Wisdom tooth extraction; Total laparoscopic hysterectomy with salpingectomy (Bilateral, 06/25/2020); Cystoscopy (N/A, 06/25/2020); and Abdominal hysterectomy. Ms. KMoleskyhas a current medication list which includes the following prescription(s): buspirone, estradiol, gabapentin, hydroxyzine, insulin pen needle, levothyroxine, saxenda, mirtazapine, nystatin, omeprazole, prazosin, sumatriptan, topiramate, venlafaxine xr, and venlafaxine xr. She  reports that she quit smoking about 38 years ago. Her smoking use included cigarettes. She started smoking about 40 years ago. She has a 1.50 pack-year smoking history. She has never used smokeless tobacco. She reports that she does not drink alcohol and does not use drugs. Ms. KPablois  allergic to penicillins, chlorhexidine gluconate, ciprofloxacin, clindamycin/lincomycin, erythromycin, gentamicin, keflex [cephalexin], lyrica [pregabalin], nitrofurantoin monohyd macro, sulfa antibiotics, tetracyclines & related, adhesive [tape], latex, and vancomycin.  ? ?HPI  ?Today, she is being contacted for a post-procedure assessment. ? ? ?Post-procedure evaluation  ? ?  Type: Therapeutic Medial Branch Facet Block with steroid #6 (#5 done 07/23/21 ?Region: Lumbar ?Level: L3/4, L4/5, L5/S1 ?Laterality: Bilateral ? ?Effectiveness:  ?Initial hour after procedure: 80 %  ?Subsequent 4-6 hours post-procedure: 80 %  ?Analgesia past initial 6 hours:  (25% on left side 75% on the right)  ?Ongoing improvement:  ?Analgesic:  70% on left side but now pain relief is decreasing ; 75% right side ?Function: Kelly [name] reports improvement in function more for right ?ROM: Kelly [name] reports improvement in ROM more for right ? ? ?Laboratory Chemistry Profile  ? ?Renal ?Lab Results  ?Component Value Date  ? BUN 13 05/15/2021  ? CREATININE 0.83 05/15/2021  ? BCR NOT APPLICABLE 49/70/2637  ? GFRAA 69 12/26/2020  ? GFRNONAA >60 05/15/2021  ?  Hepatic ?Lab Results  ?Component Value Date  ? AST 19 12/26/2020  ? ALT 21 12/26/2020  ? ALBUMIN 4.1 05/22/2020  ? ALKPHOS 63 05/22/2020  ? LIPASE 23 05/22/2020  ?  ?Electrolytes ?Lab Results  ?Component Value Date  ? NA 139 05/15/2021  ? K 4.0 05/15/2021  ? CL 111 05/15/2021  ? CALCIUM 8.7 (L) 05/15/2021  ?  Bone ?Lab Results  ?Component Value Date  ? VD25OH 37 03/31/2019  ?  ?Inflammation (CRP: Acute Phase) (ESR: Chronic Phase) ?No results found for: CRP, ESRSEDRATE, LATICACIDVEN    ?  ? ?Note: Above Lab results reviewed. ? ?Imaging  ?DG Cervical Spine Complete ?CLINICAL DATA:  Patient states no known trauma but has been having ?pain since 2020. ? ?EXAM: ?CERVICAL SPINE - COMPLETE 4+ VIEW ? ?COMPARISON:  None. ? ?FINDINGS: ?No fracture or bone lesion. No spondylolisthesis.  Generalized ?straightening of the normal cervical lordosis. ? ?Moderate loss of disc height at C5-C6. Mild loss of disc height at ?C6-C7. Mild endplate spurring noted at C5-C6. ? ?Uncovertebral spurring with at least mild neural foraminal narrowing ?on the left at C3-C4. Minor neural foraminal narrowing from ?uncovertebral spurring bilaterally at C5-C6. Facet degenerative ?changes most evident on the left at C3-C4. ? ?Soft tissues are unremarkable. ? ?IMPRESSION: ?1. No fracture or acute finding. ?2. Degenerative changes as detailed. ? ?Electronically Signed ?  By: Lajean Manes M.D. ?  On: 09/19/2021 16:29 ? ?Assessment  ?The primary encounter diagnosis was Lumbar spondylosis. Diagnoses of Spondylolisthesis of lumbosacral region, Chronic pain syndrome, Cervicalgia, and Cervical facet joint syndrome were also pertinent to this visit. ? ?Plan of Care  ?Problem-specific:  ?Lumbar spondylosis ?Status post positive diagnostic lumbar facet medial branch nerve blocks that provided her with at least 70% pain relief for 5 days.  We discussed lumbar radiofrequency ablation for the purpose of obtaining longer-term pain relief.  Risk and benefits reviewed.  We will start with the left side first followed 2 weeks later by the right side.  We will plan on doing this with sedation. ? ?Cervical facet joint syndrome ?Obtain cervical spine x-ray given cervicalgia and neck pain with occasional radiation into bilateral forearms and hands.  Patient is also been having headaches.  Her x-rays show Moderate loss of disc height at C5-C6. Mild loss of disc height at C6-C7. Mild endplate spurring noted at C5-C6. ? Uncovertebral spurring with at least mild neural foraminal narrowing on the left at C3-C4. Minor neural foraminal narrowing from uncovertebral spurring bilaterally at C5-C6. Facet degenerative changes most evident on the left at C3-C4.  She has been doing home physical therapy exercises for her neck.  Recommend cervical MRI for  further work-up after which we will discuss  treatment plan which may include cervical epidural steroid injection, cervical facet medial branch nerve block and possible RFA. ? ?Ms. [name] has a current medication li

## 2021-10-23 ENCOUNTER — Telehealth (INDEPENDENT_AMBULATORY_CARE_PROVIDER_SITE_OTHER): Payer: Commercial Managed Care - PPO | Admitting: Psychiatry

## 2021-10-23 ENCOUNTER — Encounter: Payer: Self-pay | Admitting: Psychiatry

## 2021-10-23 DIAGNOSIS — G4701 Insomnia due to medical condition: Secondary | ICD-10-CM

## 2021-10-23 DIAGNOSIS — Z634 Disappearance and death of family member: Secondary | ICD-10-CM

## 2021-10-23 DIAGNOSIS — F411 Generalized anxiety disorder: Secondary | ICD-10-CM

## 2021-10-23 DIAGNOSIS — F410 Panic disorder [episodic paroxysmal anxiety] without agoraphobia: Secondary | ICD-10-CM

## 2021-10-23 DIAGNOSIS — F3342 Major depressive disorder, recurrent, in full remission: Secondary | ICD-10-CM

## 2021-10-23 NOTE — Progress Notes (Signed)
Virtual Visit via Video Note ? ?I connected with Kelly [name] on 10/23/21 at 10:00 AM EDT by a video enabled telemedicine application and verified that I am speaking with the correct person using two identifiers. ? ?Location ?Provider Location : [facility] ?Patient Location : Home ? ?Participants: Patient , Provider ?  ?I discussed the limitations of evaluation and management by telemedicine and the availability of in person appointments. The patient expressed understanding and agreed to proceed. ? ?  ?I discussed the assessment and treatment plan with the patient. The patient was provided an opportunity to ask questions and all were answered. The patient agreed with the plan and demonstrated an understanding of the instructions. ?  ?The patient was advised to call back or seek an in-person evaluation if the symptoms worsen or if the condition fails to improve as anticipated. ? ?[place] MD OP Progress Note ? ?10/23/2021 11:38 AM ?Kelly [name]  ?MRN:  [id number] ? ?Chief Complaint:  ?Chief Complaint  ?Patient presents with  ? Follow-up: 56 year old female who has a history of MDD, GAD, panic disorder, insomnia, chronic pain presented for medication management.  ? ?HPI: Kelly [name] is a 56 year old Caucasian female who is married, lives in [place], has a history of MDD, GAD, panic disorder, insomnia, gastroesophageal reflux disease, lumbar disc problem, migraine headache was evaluated by telemedicine today. ? 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  That does affect her sleep to some extent.  She also has pets who needs her help at night.  Patient agreeable to working on sleep hygiene.  Currently compliant on medications like mirtazapine which helps with sleep. ? ?Patient reports overall mood symptoms as good on the current medication regimen.  Denies any significant depression or mood swings.  Reports she is currently taking a lower dosage  of the venlafaxine.  She wants to stay on this dose.  Venlafaxine 150 mg. ? ?Patient denies any suicidality, homicidality or perceptual disturbances. ? ?Continues to follow-up with her therapist. ? ?Currently compliant on levothyroxine, most recent TSH-dated 09/05/2021-within normal limits. ? ?Patient denies any other concerns today. ? ?Visit Diagnosis:  ?  ICD-10-CM   ?1. MDD (major depressive disorder), recurrent, in full remission ([place])  F33.42   ?  ?2. GAD (generalized anxiety disorder)  F41.1   ?  ?3. Panic disorder  F41.0   ?  ?4. Insomnia due to medical condition  G47.01   ? Sleep hygiene problems  ?  ?5. Bereavement  Z63.4   ?  ? ? ?Past Psychiatric History: Reviewed past psychiatric history from progress note on 10/25/2018.  Past trials of medications like Paxil, trazodone, hydroxyzine.  Completed IOP on 10/03/2019. ? ?Past Medical History:  ?Past Medical History:  ?Diagnosis Date  ? Allergy   ? Anxiety   ? Depression   ? Family history of ovarian cancer   ? 2/23 cancer genetic testing letter sent  ? GERD (gastroesophageal reflux disease)   ? History of kidney stones   ? Insomnia   ? Intermittent low back pain   ? Migraines   ? Osteoarthritis   ? Recurrent UTI   ? Restless leg syndrome   ? Sciatica of right side   ? left side  ? Symptomatic menopausal or female climacteric states   ? Vertigo   ? Vitamin D deficiency   ?  ?Past Surgical History:  ?Procedure Laterality Date  ? ABDOMINAL HYSTERECTOMY    ? BACK SURGERY    ?  COLONOSCOPY WITH PROPOFOL N/A 02/03/2019  ? Procedure: COLONOSCOPY WITH PROPOFOL;  Surgeon: Jonathon Bellows, MD;  Location[facility] ENDOSCOPY;  Service: Gastroenterology;  Laterality: N/A;  ? COLONOSCOPY WITH PROPOFOL N/A 03/10/2019  ? Procedure: COLONOSCOPY WITH PROPOFOL;  Surgeon: Jonathon Bellows, MD;  Location: [facility] ENDOSCOPY;  Service: Gastroenterology;  Laterality: N/A;  ? CYSTOSCOPY N/A 06/25/2020  ? Procedure: CYSTOSCOPY;  Surgeon: Homero Fellers, MD;  Location: [facility] ORS;  Service:  Gynecology;  Laterality: N/A;  ? DIAGNOSTIC LAPAROSCOPY    ? DILATION AND CURETTAGE OF UTERUS    ? HYSTEROSCOPY WITH D & C N/A 05/29/2020  ? Procedure: DILATATION AND CURETTAGE /HYSTEROSCOPY;  Surgeon: Homero Fellers, MD;  Location: [facility] ORS;  Service: Gynecology;  Laterality: N/A;  ? KNEE ARTHROSCOPY WITH MEDIAL MENISECTOMY Left 01/14/2017  ? Procedure: KNEE ARTHROSCOPY WITH MEDIAL AND LATERAL MENISECTOMY;  Surgeon: Hessie Knows, MD;  Location: [facility] ORS;  Service: Orthopedics;  Laterality: Left;  Partial Knee menisectomy  ? KNEE SURGERY Left 05/2011  ? Dr. Rudene Christians- Arthroscopic  ? LUMBAR LAMINECTOMY/DECOMPRESSION MICRODISCECTOMY N/A 09/16/2016  ? Procedure: LUMBAR LAMINECTOMY/DECOMPRESSION MICRODISCECTOMY 1 LEVEL L5-S1;  Surgeon: Meade Maw, MD;  Location: [facility] ORS;  Service: Neurosurgery;  Laterality: N/A;  ? TONSILLECTOMY AND ADENOIDECTOMY    ? TOTAL LAPAROSCOPIC HYSTERECTOMY WITH SALPINGECTOMY Bilateral 06/25/2020  ? Procedure: TOTAL LAPAROSCOPIC HYSTERECTOMY WITH BILATERAL SALPINGECTOMY;  Surgeon: Homero Fellers, MD;  Location: [facility] ORS;  Service: Gynecology;  Laterality: Bilateral;  ? URETHRAL STRICTURE DILATATION    ? WISDOM TOOTH EXTRACTION    ? ? ?Family Psychiatric History: Reviewed family psychiatric history from progress note from 10/04/2019. ? ?Family History:  ?Family History  ?Problem Relation Age of Onset  ? Dementia Father   ? Ovarian cancer Paternal Grandmother   ? Dementia Paternal Aunt   ? Anxiety disorder Cousin   ? Depression Cousin   ? ? ?Social History: Reviewed social history from progress note from 10/03/2019. ?Social History  ? ?Socioeconomic History  ? Marital status: Married  ?  Spouse name: Jenny Reichmann  ? Number of children: 2  ? Years of education: 36  ? Highest education level: Associate degree: occupational, Hotel manager, or vocational program  ?Occupational History  ? Occupation: not employed  ?Tobacco Use  ? Smoking status: Former  ?  Packs/day: 0.75  ?  Years: 2.00  ?  Pack  years: 1.50  ?  Types: Cigarettes  ?  Start date: 07/06/1981  ?  Quit date: 07/07/1983  ?  Years since quitting: 38.3  ? Smokeless tobacco: Never  ?Vaping Use  ? Vaping Use: Never used  ?Substance and Sexual Activity  ? Alcohol use: No  ?  Alcohol/week: 0.0 standard drinks  ? Drug use: No  ? Sexual activity: Not Currently  ?  Partners: Male  ?Other Topics Concern  ? Not on file  ?Social History Narrative  ? Married and husband has a lot of medical problems, he is working again   ? ?Social Determinants of Health  ? ?Financial Resource Strain: Not on file  ?Food Insecurity: Not on file  ?Transportation Needs: Not on file  ?Physical Activity: Not on file  ?Stress: Not on file  ?Social Connections: Not on file  ? ? ?Allergies:  ?Allergies  ?Allergen Reactions  ? Penicillins Anaphylaxis, Hives, Nausea And Vomiting and Swelling  ?  Has patient had a PCN reaction causing immediate rash, facial/tongue/throat swelling, SOB or lightheadedness with hypotension: Yes ?Has patient had a PCN reaction causing severe rash involving mucus membranes or  skin necrosis: Yes ?Has patient had a PCN reaction that required hospitalization No ?Has patient had a PCN reaction occurring within the last 10 years: Yes ?If all of the above answers are "NO", then may proceed with Cephalosporin use. ?  ? Chlorhexidine Gluconate Itching and Rash  ? Ciprofloxacin Hives  ? Clindamycin/Lincomycin Hives  ? Erythromycin Hives and Nausea And Vomiting  ? Gentamicin Hives  ? Keflex [Cephalexin] Hives  ? Lyrica [Pregabalin]   ?  Dizziness, syncope  ? Nitrofurantoin Monohyd Macro Hives and Nausea And Vomiting  ? Sulfa Antibiotics Hives and Nausea And Vomiting  ?  Rapid heart rate  ? Tetracyclines & Related Hives  ? Adhesive [Tape] Rash  ? Latex Rash  ? Vancomycin Itching and Rash  ? ? ?Metabolic Disorder Labs: ?Lab Results  ?Component Value Date  ? HGBA1C 5.6 03/21/2020  ? MPG 114 03/21/2020  ? MPG 105 03/31/2019  ? ?No results found for: PROLACTIN ?Lab Results   ?Component Value Date  ? CHOL 179 03/21/2020  ? TRIG 146 03/21/2020  ? HDL 59 03/21/2020  ? CHOLHDL 3.0 03/21/2020  ? VLDL 22 12/31/2016  ? [place] 95 03/21/2020  ? [place] 82 06/17/2018  ? ?Lab Results  ?Compon

## 2021-11-10 ENCOUNTER — Ambulatory Visit: Payer: Commercial Managed Care - PPO | Admitting: Family Medicine

## 2021-11-19 ENCOUNTER — Ambulatory Visit (HOSPITAL_BASED_OUTPATIENT_CLINIC_OR_DEPARTMENT_OTHER): Payer: Commercial Managed Care - PPO | Admitting: Student in an Organized Health Care Education/Training Program

## 2021-11-19 ENCOUNTER — Ambulatory Visit
Admission: RE | Admit: 2021-11-19 | Discharge: 2021-11-19 | Disposition: A | Discharge status: Home / Self Care | Payer: Commercial Managed Care - PPO | Source: Ambulatory Visit | Attending: Student in an Organized Health Care Education/Training Program | Admitting: Student in an Organized Health Care Education/Training Program

## 2021-11-19 ENCOUNTER — Encounter: Payer: Self-pay | Admitting: Student in an Organized Health Care Education/Training Program

## 2021-11-19 VITALS — BP 102/50 | HR 85 | Temp 97.5°F | Resp 16 | Ht 64.0 in | Wt 225.0 lb

## 2021-11-19 DIAGNOSIS — G894 Chronic pain syndrome: Secondary | ICD-10-CM | POA: Insufficient documentation

## 2021-11-19 DIAGNOSIS — M47816 Spondylosis without myelopathy or radiculopathy, lumbar region: Secondary | ICD-10-CM | POA: Insufficient documentation

## 2021-11-19 DIAGNOSIS — M4317 Spondylolisthesis, lumbosacral region: Secondary | ICD-10-CM

## 2021-11-19 MED ORDER — LIDOCAINE HCL 2 % IJ SOLN
INTRAMUSCULAR | Status: AC
  Filled 2021-11-19: qty 20

## 2021-11-19 MED ORDER — MIDAZOLAM HCL 5 MG/5ML IJ SOLN
0.5000 mg | Freq: Once | INTRAMUSCULAR | Status: AC
  Administered 2021-11-19: 2 mg via INTRAVENOUS

## 2021-11-19 MED ORDER — MIDAZOLAM HCL 5 MG/5ML IJ SOLN
INTRAMUSCULAR | Status: AC
  Filled 2021-11-19: qty 5

## 2021-11-19 MED ORDER — LIDOCAINE HCL 2 % IJ SOLN
20.0000 mL | Freq: Once | INTRAMUSCULAR | Status: AC
Start: 2021-11-19 — End: 2021-11-19
  Administered 2021-11-19: 400 mg

## 2021-11-19 MED ORDER — FENTANYL CITRATE (PF) 100 MCG/2ML IJ SOLN
25.0000 ug | INTRAMUSCULAR | Status: DC | PRN
  Administered 2021-11-19: 75 ug via INTRAVENOUS

## 2021-11-19 MED ORDER — DEXAMETHASONE SODIUM PHOSPHATE 10 MG/ML IJ SOLN
INTRAMUSCULAR | Status: AC
  Filled 2021-11-19: qty 1

## 2021-11-19 MED ORDER — ROPIVACAINE HCL 2 MG/ML IJ SOLN
INTRAMUSCULAR | Status: AC
  Filled 2021-11-19: qty 20

## 2021-11-19 MED ORDER — ROPIVACAINE HCL 2 MG/ML IJ SOLN
9.0000 mL | Freq: Once | INTRAMUSCULAR | Status: AC
  Administered 2021-11-19: 9 mL via PERINEURAL

## 2021-11-19 MED ORDER — FENTANYL CITRATE (PF) 100 MCG/2ML IJ SOLN
INTRAMUSCULAR | Status: AC
  Filled 2021-11-19: qty 2

## 2021-11-19 MED ORDER — DEXAMETHASONE SODIUM PHOSPHATE 10 MG/ML IJ SOLN
10.0000 mg | Freq: Once | INTRAMUSCULAR | Status: AC
  Administered 2021-11-19: 10 mg

## 2021-11-19 NOTE — Progress Notes (Signed)
Safety precautions to be maintained throughout the outpatient stay will include: orient to surroundings, keep bed in low position, maintain call bell within reach at all times, provide assistance with transfer out of bed and ambulation.  

## 2021-11-19 NOTE — Patient Instructions (Signed)

## 2021-11-19 NOTE — Progress Notes (Signed)
PROVIDER NOTE: Interpretation of information contained herein should be left to medically-trained personnel. Specific patient instructions are provided elsewhere under "Patient Instructions" section of medical record. This document was created in part using STT-dictation technology, any transcriptional errors that may result from this process are unintentional.  ?Patient: Kelly [name] ?Type: Established ?DOB: 1965/07/09 ?MRN: [id number] ?PCP: No primary care provider on file.  Service: Procedure ?DOS: 11/19/2021 ?Setting: Ambulatory ?Location: Ambulatory outpatient facility ?Delivery: Face-to-face Provider: Gillis Santa, MD ?Specialty: Interventional Pain Management ?Specialty designation: 09 ?Location: Outpatient facility ?Ref. Prov.: Margret Chance, MD   ? ?Primary Reason for Visit: Interventional Pain Management Treatment. ?CC: Back Pain (lower) ? ?Procedure:          ? Type: Lumbar Facet, Medial Branch Radiofrequency Ablation (RFA) #1  ?Laterality: Left  ?Level: L3, L4, L5, Medial Branch Level(s). These levels will denervate the L3-4 and L5-S1 lumbar facet joints.  ?Imaging: Fluoroscopic guidance ?Anesthesia: Local anesthesia (1-2% Lidocaine) ?Sedation: Moderate conscious sedation. ?DOS: 11/19/2021  ?Performed by: Gillis Santa, MD ? ?Purpose: Therapeutic/Palliative ?Indications: Low back pain severe enough to impact quality of life or function. ?Indications: ?1. Lumbar spondylosis   ?2. Spondylolisthesis of lumbosacral region   ?3. Chronic pain syndrome   ? ?Kelly [name] has been dealing with the above chronic pain for longer than three months and has either failed to respond, was unable to tolerate, or simply did not get enough benefit from other more conservative therapies including, but not limited to: ?1. Over-the-counter medications ?2. Anti-inflammatory medications ?3. Muscle relaxants ?4. Membrane stabilizers ?5. Opioids ?6. Physical therapy and/or chiropractic manipulation ?7. Modalities (Heat, ice,  etc.) ?8. Invasive techniques such as nerve blocks. ?Kelly [name] has attained more than 50% relief of the pain from a series of diagnostic injections conducted in separate occasions. ? ?Pain Score: ?Pre-procedure: 7 /10 ?Post-procedure: 1 /10 ? ?  ? ?Position / Prep / Materials:  ?Position: Prone  ?Prep solution: DuraPrep (Iodine Povacrylex [0.7% available iodine] and Isopropyl Alcohol, 74% w/w) ?Prep Area: Entire Lumbosacral Region (Lower back from mid-thoracic region to end of tailbone and from flank to flank.) ?Materials:  ?Tray: RFA (Radiofrequency) tray ?Needle(s):  ?Type: RFA (Teflon-coated radiofrequency ablation needles) ?Gauge (G): 22  ?Length: Regular (10cm) ?Qty: 3 ? ?Pre-op H&P Assessment:  ?Kelly [name] is a 56 y.o. (year old), female patient, seen today for interventional treatment. She  has a past surgical history that includes Tonsillectomy and adenoidectomy; Urethra dilation; Knee surgery (Left, 05/2011); Lumbar laminectomy/decompression microdiscectomy (N/A, 09/16/2016); Back surgery; Diagnostic laparoscopy; Dilation and curettage of uterus; Knee arthroscopy with medial menisectomy (Left, 01/14/2017); Colonoscopy with propofol (N/A, 02/03/2019); Colonoscopy with propofol (N/A, 03/10/2019); Hysteroscopy with D & C (N/A, 05/29/2020); Wisdom tooth extraction; Total laparoscopic hysterectomy with salpingectomy (Bilateral, 06/25/2020); Cystoscopy (N/A, 06/25/2020); and Abdominal hysterectomy. Kelly [name] has a current medication list which includes the following prescription(s): buspirone, estradiol, gabapentin, hydroxyzine, insulin pen needle, levothyroxine, saxenda, mirtazapine, nystatin, omeprazole, prazosin, sumatriptan, venlafaxine xr, and topiramate, and the following Facility-Administered Medications: fentanyl. Her primarily concern today is the Back Pain (lower) ? ?Initial Vital Signs:  ?Pulse/HCG Rate: 85ECG Heart Rate: 70 ?Temp:  ?(!) 97.5 ?F (36.4 ?C) ?Resp: 16 ?BP: 122/78 ?SpO2: 99  % ? ?BMI: Estimated body mass index is 38.62 kg/m? as calculated from the following: ?  Height as of this encounter: '5\' 4"'$  (1.626 m). ?  Weight as of this encounter: 225 lb (102.1 kg). ? ?Risk Assessment: ?Allergies: Reviewed. She is allergic to penicillins, chlorhexidine gluconate, ciprofloxacin, clindamycin/lincomycin, erythromycin, gentamicin, keflex [  cephalexin], lyrica [pregabalin], nitrofurantoin monohyd macro, sulfa antibiotics, tetracyclines & related, adhesive [tape], latex, and vancomycin.  ?Allergy Precautions: None required ?Coagulopathies: Reviewed. None identified.  ?Blood-thinner therapy: None at this time ?Active Infection(s): Reviewed. None identified. Kelly [name] is afebrile ? ?Site Confirmation: Kelly [name] was asked to confirm the procedure and laterality before marking the site ?Procedure checklist: Completed ?Consent: Before the procedure and under the influence of no sedative(s), amnesic(s), or anxiolytics, the patient was informed of the treatment options, risks and possible complications. To fulfill our ethical and legal obligations, as recommended by the [employer] Association's Code of Ethics, I have informed the patient of my clinical impression; the nature and purpose of the treatment or procedure; the risks, benefits, and possible complications of the intervention; the alternatives, including doing nothing; the risk(s) and benefit(s) of the alternative treatment(s) or procedure(s); and the risk(s) and benefit(s) of doing nothing. ?The patient was provided information about the general risks and possible complications associated with the procedure. These may include, but are not limited to: failure to achieve desired goals, infection, bleeding, organ or nerve damage, allergic reactions, paralysis, and death. ?In addition, the patient was informed of those risks and complications associated to Spine-related procedures, such as failure to decrease pain; infection (i.e.: Meningitis,  epidural or intraspinal abscess); bleeding (i.e.: epidural hematoma, subarachnoid hemorrhage, or any other type of intraspinal or peri-dural bleeding); organ or nerve damage (i.e.: Any type of peripheral nerve, nerve root, or spinal cord injury) with subsequent damage to sensory, motor, and/or autonomic systems, resulting in permanent pain, numbness, and/or weakness of one or several areas of the body; allergic reactions; (i.e.: anaphylactic reaction); and/or death. ?Furthermore, the patient was informed of those risks and complications associated with the medications. These include, but are not limited to: allergic reactions (i.e.: anaphylactic or anaphylactoid reaction(s)); adrenal axis suppression; blood sugar elevation that in diabetics may result in ketoacidosis or comma; water retention that in patients with history of congestive heart failure may result in shortness of breath, pulmonary edema, and decompensation with resultant heart failure; weight gain; swelling or edema; medication-induced neural toxicity; particulate matter embolism and blood vessel occlusion with resultant organ, and/or nervous system infarction; and/or aseptic necrosis of one or more joints. ?Finally, the patient was informed that Medicine is not an exact science; therefore, there is also the possibility of unforeseen or unpredictable risks and/or possible complications that may result in a catastrophic outcome. The patient indicated having understood very clearly. We have given the patient no guarantees and we have made no promises. Enough time was given to the patient to ask questions, all of which were answered to the patient's satisfaction. Ms. Atchley has indicated that she wanted to continue with the procedure. ?Attestation: I, the ordering provider, attest that I have discussed with the patient the benefits, risks, side-effects, alternatives, likelihood of achieving goals, and potential problems during recovery for the procedure  that I have provided informed consent. ?Date  Time: 11/19/2021  7:45 AM ? ?Pre-Procedure Preparation:  ?Monitoring: As per clinic protocol. Respiration, ETCO2, SpO2, BP, heart rate and rhythm monitor placed and c

## 2021-11-20 ENCOUNTER — Telehealth: Payer: Self-pay | Admitting: *Deleted

## 2021-11-20 NOTE — Telephone Encounter (Signed)
Called for post procedure check. No answer. LVM. 

## 2021-12-11 ENCOUNTER — Ambulatory Visit: Payer: Commercial Managed Care - PPO | Admitting: Urology

## 2021-12-24 ENCOUNTER — Other Ambulatory Visit: Payer: Self-pay | Admitting: Family Medicine

## 2021-12-24 DIAGNOSIS — E039 Hypothyroidism, unspecified: Secondary | ICD-10-CM

## 2021-12-24 NOTE — Telephone Encounter (Signed)
Pt has transferred out

## 2021-12-29 ENCOUNTER — Other Ambulatory Visit: Payer: Self-pay | Admitting: Psychiatry

## 2021-12-29 DIAGNOSIS — F411 Generalized anxiety disorder: Secondary | ICD-10-CM

## 2021-12-29 DIAGNOSIS — F410 Panic disorder [episodic paroxysmal anxiety] without agoraphobia: Secondary | ICD-10-CM

## 2021-12-29 DIAGNOSIS — G4701 Insomnia due to medical condition: Secondary | ICD-10-CM

## 2022-01-19 ENCOUNTER — Telehealth: Payer: Commercial Managed Care - PPO | Admitting: Psychiatry

## 2022-02-09 ENCOUNTER — Other Ambulatory Visit: Payer: Self-pay | Admitting: Psychiatry

## 2022-02-09 DIAGNOSIS — F410 Panic disorder [episodic paroxysmal anxiety] without agoraphobia: Secondary | ICD-10-CM

## 2022-02-09 DIAGNOSIS — F411 Generalized anxiety disorder: Secondary | ICD-10-CM

## 2022-02-09 DIAGNOSIS — F3342 Major depressive disorder, recurrent, in full remission: Secondary | ICD-10-CM

## 2022-05-07 IMAGING — US US PELVIS COMPLETE TRANSABD/TRANSVAG W DUPLEX
1 series · 13 of 25 positions shown · non-contrast
Comparison: 03/25/2020

CLINICAL DATA: Pelvic pain, postmenopausal bleeding for 3 months

EXAM:
TRANSABDOMINAL AND TRANSVAGINAL ULTRASOUND OF PELVIS
DOPPLER ULTRASOUND OF OVARIES
TECHNIQUE: Both transabdominal and transvaginal ultrasound examinations of the
pelvis were performed. Transabdominal technique was performed for
global imaging of the pelvis including uterus, ovaries, adnexal
regions, and pelvic cul-de-sac.
It was necessary to proceed with endovaginal exam following the
transabdominal exam to visualize the endometrium. Color and duplex
Doppler ultrasound was utilized to evaluate blood flow to the
ovaries.

[Series 1: us pelvic complete w transvaginal and torsion righ · 13 of 136 slices shown]
[im 1/136]
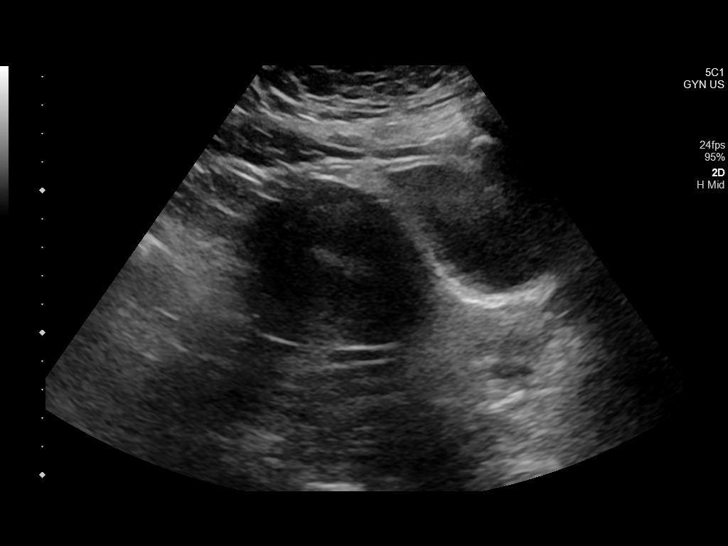
[im 12/136]
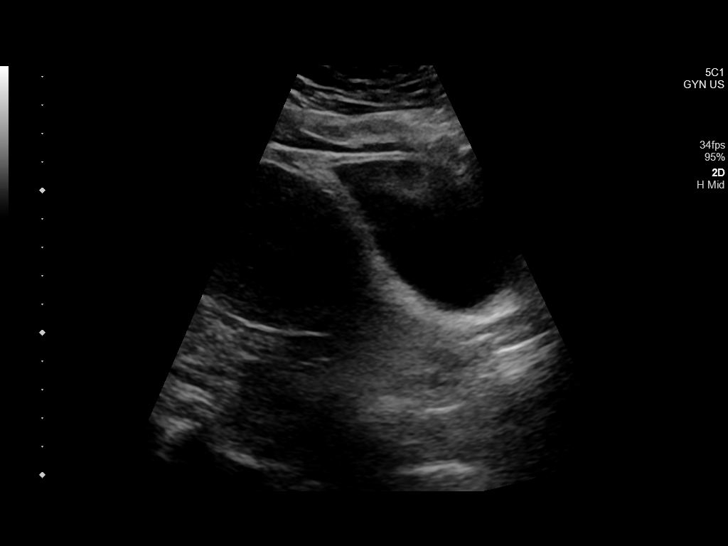
[im 23/136]
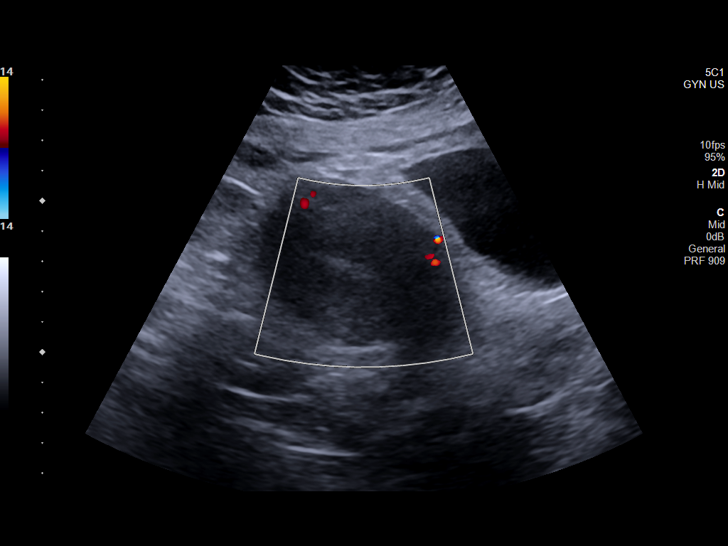
[im 34/136]
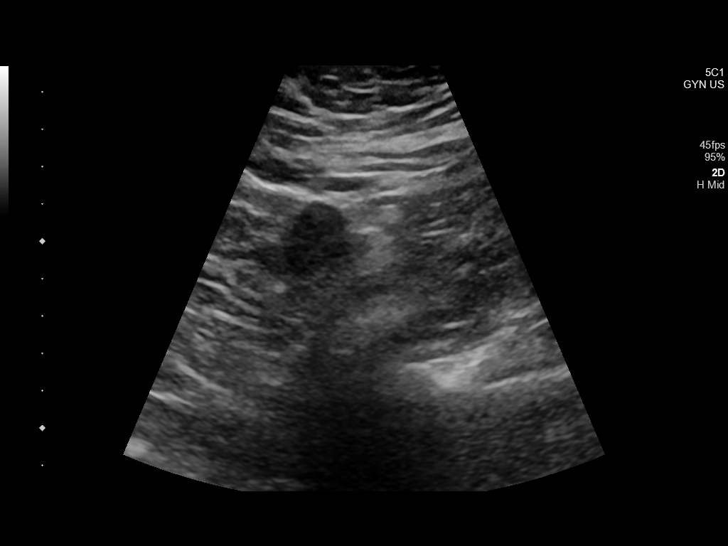
[im 46/136]
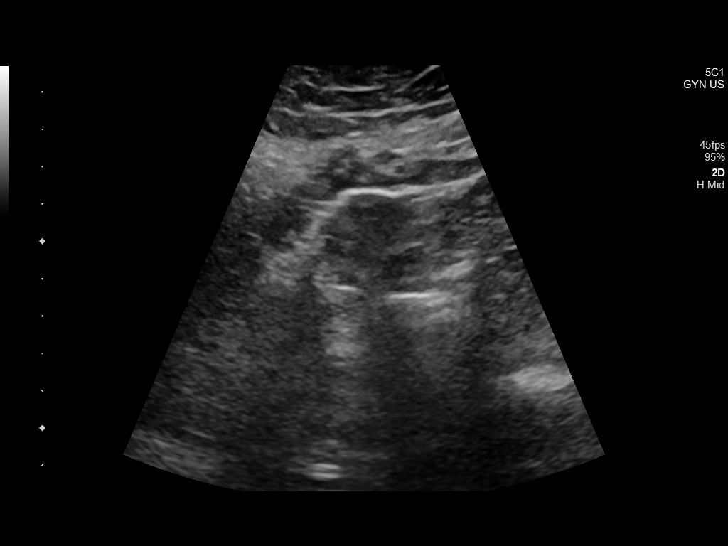
[im 57/136]
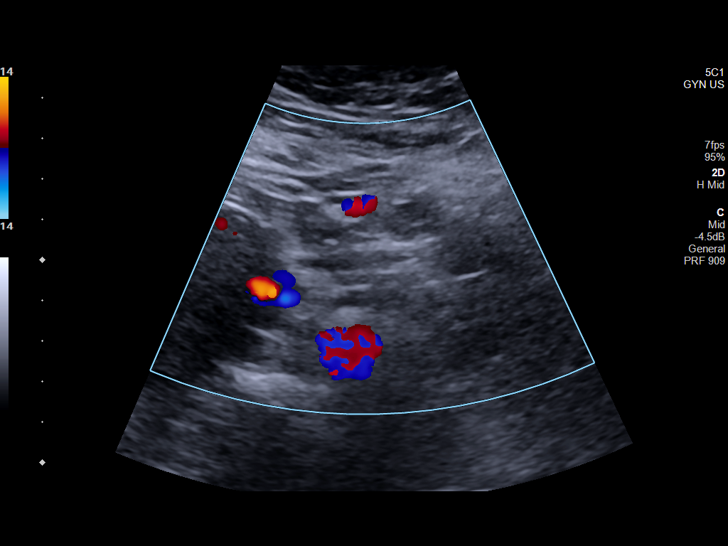
[im 68/136]
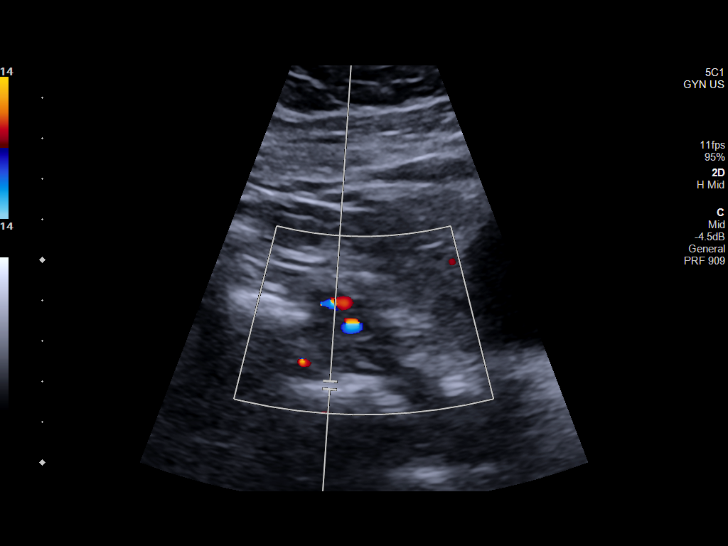
[im 79/136]
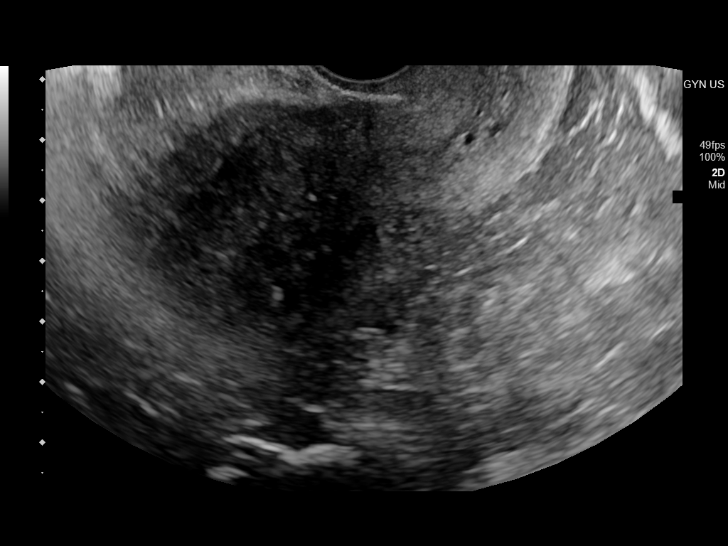
[im 91/136]
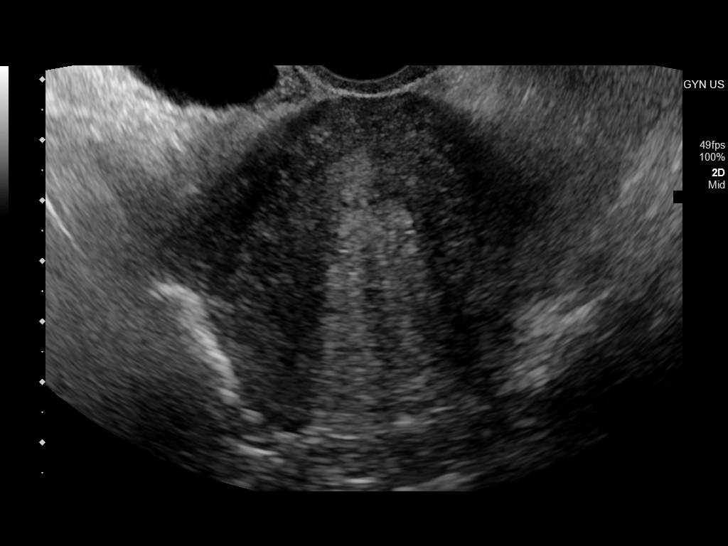
[im 102/136]
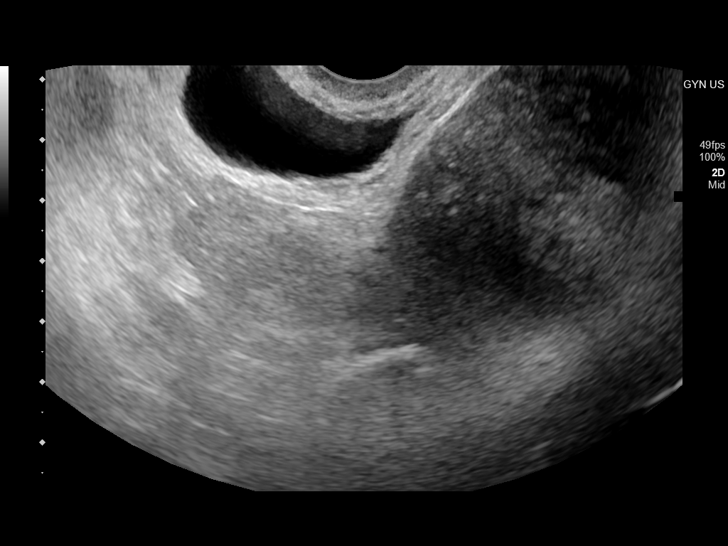
[im 113/136]
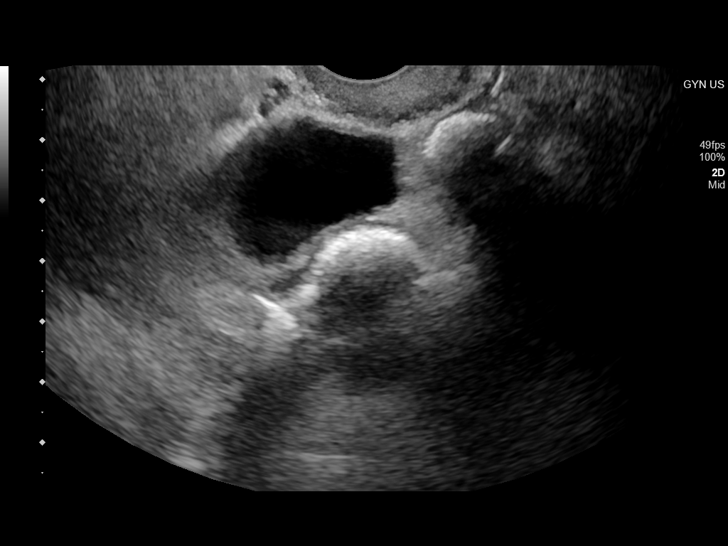
[im 124/136]
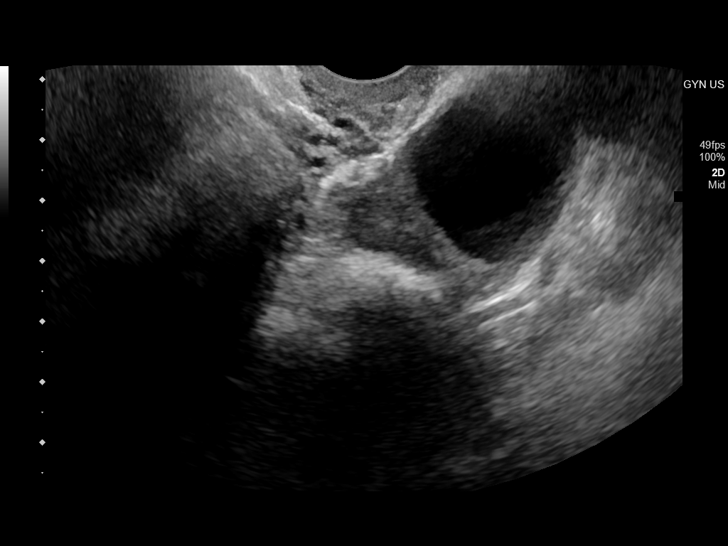
[im 136/136]
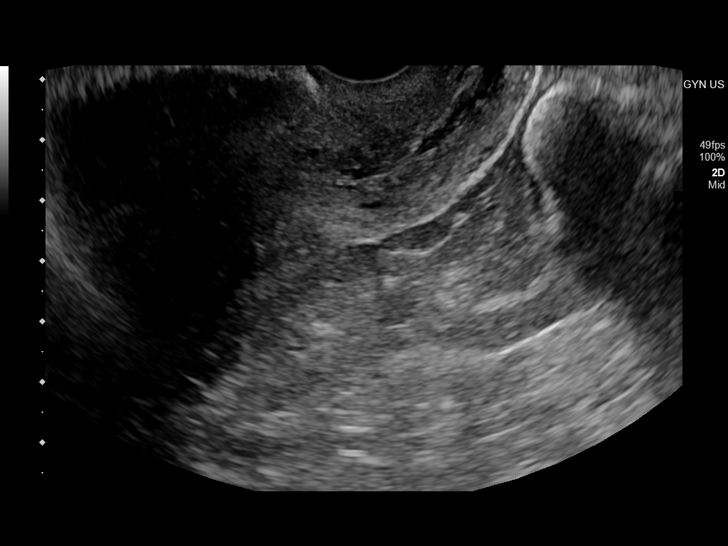

[13 of 25 positions shown; findings below may reference images not displayed]

FINDINGS: Uterus

Measurements: 11.4 x 5.7 x 6.8 cm = volume: 233 mL. Anteverted. Tiny
nabothian cysts at cervix. Heterogeneous myometrium. No focal
uterine mass.

Endometrium

Thickness: 8 mm.  No endometrial fluid or focal abnormality

Right ovary

Measurements: 2.0 x 2.4 x 1.9 cm = volume: None 4.7 mL. Seen only on
transabdominal imaging, obscured on transvaginal imaging by bowel.
Normal morphology without mass. Internal blood flow present on color
Doppler imaging.

Left ovary

Measurements: 3.7 x 2.1 x 4.2 cm = volume: 17.1 mL. Cyst within LEFT
ovary 3.6 x 3.3 x 2.0 cm, with minimal dependent debris. No mural
nodularity or septations. No abnormal internal blood flow within the
lesion. Blood flow present within LEFT ovary on color Doppler
imaging.

Pulsed Doppler evaluation of both ovaries demonstrates normal
low-resistance arterial and venous waveforms.

Other findings

No free pelvic fluid.  No adnexal masses.
IMPRESSION: Unremarkable uterus, endometrial complex and RIGHT ovary.

3.6 cm diameter cyst within LEFT ovary containing minimal dependent
debris.

No evidence of ovarian torsion.

8 mm thick endometrial complex, abnormal for a postmenopausal
patient with bleeding; in the setting of post-menopausal bleeding,
endometrial sampling is indicated to exclude carcinoma. If results
are benign, sonohysterogram should be considered for focal lesion
work-up. (Ref: Radiological Reasoning: Algorithmic Workup of
Abnormal Vaginal Bleeding with Endovaginal Sonography and
Sonohysterography. AJR [id number]; [id number]:[id number])

These results will be called to the ordering clinician or
representative by the Radiologist Assistant, and communication
documented in the PACS or [REDACTED].
